# Patient Record
Sex: Female | Born: 1980
Health system: Southern US, Community
[De-identification: ages and names within clinical notes are randomized; demographics above are authoritative.]

## PROBLEM LIST (undated history)

## (undated) ENCOUNTER — Inpatient Hospital Stay (HOSPITAL_COMMUNITY): Payer: Self-pay

## (undated) DIAGNOSIS — D649 Anemia, unspecified: Secondary | ICD-10-CM

## (undated) DIAGNOSIS — R569 Unspecified convulsions: Secondary | ICD-10-CM

## (undated) DIAGNOSIS — D509 Iron deficiency anemia, unspecified: Secondary | ICD-10-CM

## (undated) DIAGNOSIS — J302 Other seasonal allergic rhinitis: Secondary | ICD-10-CM

## (undated) DIAGNOSIS — Z9289 Personal history of other medical treatment: Secondary | ICD-10-CM

## (undated) DIAGNOSIS — F329 Major depressive disorder, single episode, unspecified: Secondary | ICD-10-CM

## (undated) DIAGNOSIS — R531 Weakness: Secondary | ICD-10-CM

## (undated) DIAGNOSIS — K625 Hemorrhage of anus and rectum: Secondary | ICD-10-CM

## (undated) DIAGNOSIS — R112 Nausea with vomiting, unspecified: Secondary | ICD-10-CM

## (undated) DIAGNOSIS — K6289 Other specified diseases of anus and rectum: Secondary | ICD-10-CM

## (undated) DIAGNOSIS — R51 Headache: Secondary | ICD-10-CM

## (undated) DIAGNOSIS — R519 Headache, unspecified: Secondary | ICD-10-CM

## (undated) DIAGNOSIS — D569 Thalassemia, unspecified: Secondary | ICD-10-CM

## (undated) HISTORY — DX: Nausea with vomiting, unspecified: R11.2

## (undated) HISTORY — PX: DILATION AND CURETTAGE OF UTERUS: SHX78

## (undated) HISTORY — PX: ABDOMINAL HYSTERECTOMY: SHX81

## (undated) HISTORY — PX: TONGUE SURGERY: SHX810

## (undated) HISTORY — DX: Other specified diseases of anus and rectum: K62.89

## (undated) HISTORY — DX: Headache: R51

## (undated) HISTORY — DX: Iron deficiency anemia, unspecified: D50.9

## (undated) HISTORY — DX: Headache, unspecified: R51.9

## (undated) HISTORY — PX: LAPAROSCOPY: SHX197

## (undated) HISTORY — PX: TUBAL LIGATION: SHX77

## (undated) HISTORY — DX: Hemorrhage of anus and rectum: K62.5

## (undated) HISTORY — DX: Weakness: R53.1

## (undated) HISTORY — PX: WISDOM TOOTH EXTRACTION: SHX21

---

## 2005-04-06 ENCOUNTER — Emergency Department (HOSPITAL_COMMUNITY): Admission: EM | Admit: 2005-04-06 | Discharge: 2005-04-06 | Payer: Self-pay | Admitting: Emergency Medicine

## 2005-08-11 ENCOUNTER — Other Ambulatory Visit: Admission: RE | Admit: 2005-08-11 | Discharge: 2005-08-11 | Payer: Self-pay | Admitting: Gynecology

## 2005-10-01 ENCOUNTER — Inpatient Hospital Stay (HOSPITAL_COMMUNITY): Admission: AD | Admit: 2005-10-01 | Discharge: 2005-10-01 | Payer: Self-pay | Admitting: Obstetrics & Gynecology

## 2006-03-09 ENCOUNTER — Emergency Department (HOSPITAL_COMMUNITY): Admission: EM | Admit: 2006-03-09 | Discharge: 2006-03-10 | Payer: Self-pay | Admitting: Emergency Medicine

## 2006-04-15 ENCOUNTER — Encounter: Admission: RE | Admit: 2006-04-15 | Discharge: 2006-04-15 | Payer: Self-pay | Admitting: Family Medicine

## 2006-04-20 ENCOUNTER — Encounter: Admission: RE | Admit: 2006-04-20 | Discharge: 2006-04-20 | Payer: Self-pay | Admitting: Family Medicine

## 2006-08-25 ENCOUNTER — Encounter: Admission: RE | Admit: 2006-08-25 | Discharge: 2006-08-25 | Payer: Self-pay | Admitting: Family Medicine

## 2006-08-25 ENCOUNTER — Other Ambulatory Visit: Admission: RE | Admit: 2006-08-25 | Discharge: 2006-08-25 | Payer: Self-pay | Admitting: Gynecology

## 2007-07-26 ENCOUNTER — Emergency Department (HOSPITAL_COMMUNITY): Admission: EM | Admit: 2007-07-26 | Discharge: 2007-07-26 | Payer: Self-pay | Admitting: Emergency Medicine

## 2007-11-19 ENCOUNTER — Encounter: Admission: RE | Admit: 2007-11-19 | Discharge: 2007-11-19 | Payer: Self-pay | Admitting: Neurology

## 2007-11-20 ENCOUNTER — Other Ambulatory Visit: Admission: RE | Admit: 2007-11-20 | Discharge: 2007-11-20 | Payer: Self-pay | Admitting: Gynecology

## 2009-01-14 HISTORY — PX: CARPAL TUNNEL RELEASE: SHX101

## 2009-03-08 ENCOUNTER — Inpatient Hospital Stay (HOSPITAL_COMMUNITY): Admission: AD | Admit: 2009-03-08 | Discharge: 2009-03-08 | Payer: Self-pay | Admitting: Obstetrics and Gynecology

## 2009-07-04 ENCOUNTER — Emergency Department (HOSPITAL_COMMUNITY): Admission: EM | Admit: 2009-07-04 | Discharge: 2009-07-04 | Payer: Self-pay | Admitting: Emergency Medicine

## 2009-09-27 ENCOUNTER — Inpatient Hospital Stay (HOSPITAL_COMMUNITY): Admission: AD | Admit: 2009-09-27 | Discharge: 2009-09-27 | Payer: Self-pay | Admitting: Obstetrics & Gynecology

## 2010-06-18 ENCOUNTER — Inpatient Hospital Stay (HOSPITAL_COMMUNITY)
Admission: AD | Admit: 2010-06-18 | Discharge: 2010-06-18 | Payer: Self-pay | Source: Home / Self Care | Admitting: Obstetrics and Gynecology

## 2010-10-27 LAB — GC/CHLAMYDIA PROBE AMP, GENITAL
Chlamydia, DNA Probe: NEGATIVE
GC Probe Amp, Genital: NEGATIVE

## 2010-10-27 LAB — CBC
HCT: 30.5 % — ABNORMAL LOW (ref 36.0–46.0)
Hemoglobin: 9.4 g/dL — ABNORMAL LOW (ref 12.0–15.0)
MCH: 18 pg — ABNORMAL LOW (ref 26.0–34.0)
MCHC: 30.9 g/dL (ref 30.0–36.0)
MCV: 58.4 fL — ABNORMAL LOW (ref 78.0–100.0)
Platelets: 294 10*3/uL (ref 150–400)
RBC: 5.22 MIL/uL — ABNORMAL HIGH (ref 3.87–5.11)
RDW: 17.9 % — ABNORMAL HIGH (ref 11.5–15.5)
WBC: 11.9 10*3/uL — ABNORMAL HIGH (ref 4.0–10.5)

## 2010-10-27 LAB — WET PREP, GENITAL
Trich, Wet Prep: NONE SEEN
Yeast Wet Prep HPF POC: NONE SEEN

## 2010-10-27 LAB — URINE CULTURE
Colony Count: 25000
Culture  Setup Time: 201111040133

## 2010-10-27 LAB — URINALYSIS, ROUTINE W REFLEX MICROSCOPIC
Bilirubin Urine: NEGATIVE
Glucose, UA: 100 mg/dL — AB
Ketones, ur: 15 mg/dL — AB
Leukocytes, UA: NEGATIVE
Nitrite: NEGATIVE
Protein, ur: NEGATIVE mg/dL
Specific Gravity, Urine: 1.015 (ref 1.005–1.030)
Urobilinogen, UA: 0.2 mg/dL (ref 0.0–1.0)
pH: 7 (ref 5.0–8.0)

## 2010-10-27 LAB — URINE MICROSCOPIC-ADD ON

## 2010-10-27 LAB — POCT PREGNANCY, URINE: Preg Test, Ur: NEGATIVE

## 2010-10-27 LAB — PREGNANCY, URINE: Preg Test, Ur: NEGATIVE

## 2010-11-22 LAB — WET PREP, GENITAL
Trich, Wet Prep: NONE SEEN
Yeast Wet Prep HPF POC: NONE SEEN

## 2010-11-22 LAB — GC/CHLAMYDIA PROBE AMP, GENITAL
Chlamydia, DNA Probe: NEGATIVE
GC Probe Amp, Genital: NEGATIVE

## 2010-11-22 LAB — CBC
HCT: 31.6 % — ABNORMAL LOW (ref 36.0–46.0)
Hemoglobin: 9.8 g/dL — ABNORMAL LOW (ref 12.0–15.0)
MCHC: 31.1 g/dL (ref 30.0–36.0)
MCV: 59.4 fL — ABNORMAL LOW (ref 78.0–100.0)
Platelets: 333 10*3/uL (ref 150–400)
RBC: 5.32 MIL/uL — ABNORMAL HIGH (ref 3.87–5.11)
RDW: 17 % — ABNORMAL HIGH (ref 11.5–15.5)
WBC: 9 10*3/uL (ref 4.0–10.5)

## 2010-11-22 LAB — POCT PREGNANCY, URINE: Preg Test, Ur: NEGATIVE

## 2011-05-24 ENCOUNTER — Inpatient Hospital Stay (HOSPITAL_COMMUNITY)
Admission: AD | Admit: 2011-05-24 | Discharge: 2011-05-24 | Disposition: A | Discharge status: Home / Self Care | Payer: 59 | Source: Ambulatory Visit | Attending: Obstetrics and Gynecology | Admitting: Obstetrics and Gynecology

## 2011-05-24 ENCOUNTER — Encounter (HOSPITAL_COMMUNITY): Payer: Self-pay | Admitting: *Deleted

## 2011-05-24 DIAGNOSIS — Z3201 Encounter for pregnancy test, result positive: Secondary | ICD-10-CM

## 2011-05-24 HISTORY — DX: Unspecified convulsions: R56.9

## 2011-05-24 HISTORY — DX: Thalassemia, unspecified: D56.9

## 2011-05-24 HISTORY — DX: Anemia, unspecified: D64.9

## 2011-05-24 LAB — URINALYSIS, ROUTINE W REFLEX MICROSCOPIC
Bilirubin Urine: NEGATIVE
Glucose, UA: NEGATIVE mg/dL
Ketones, ur: NEGATIVE mg/dL
Leukocytes, UA: NEGATIVE
Nitrite: NEGATIVE
Protein, ur: NEGATIVE mg/dL
Specific Gravity, Urine: 1.02 (ref 1.005–1.030)
Urobilinogen, UA: 0.2 mg/dL (ref 0.0–1.0)
pH: 7.5 (ref 5.0–8.0)

## 2011-05-24 LAB — URINE MICROSCOPIC-ADD ON

## 2011-05-24 LAB — BASIC METABOLIC PANEL
BUN: 7
CO2: 25
Calcium: 9.4
Chloride: 107
Creatinine, Ser: 0.63
GFR calc Af Amer: >60
GFR calc non Af Amer: >60
Glucose, Bld: 172 — ABNORMAL HIGH
Potassium: 4.1
Sodium: 140

## 2011-05-24 LAB — POCT PREGNANCY, URINE: Preg Test, Ur: POSITIVE

## 2011-05-24 MED ORDER — PRENATAL RX 60-1 MG PO TABS: 1.0000 | ORAL_TABLET | Freq: Every day | ORAL | Status: DC

## 2011-05-24 NOTE — Progress Notes (Signed)
Pt in to find out if she was pregnant. LMP 04/21/11.  Had + UPT.  Reports small amount of lower back pain.  Denies any cramping or bleeding.  Is currently taking Depakote for seizures, worried about pregnancy.

## 2011-05-24 NOTE — ED Provider Notes (Signed)
History     CSN: [id number] Arrival date & time: 05/24/2011  6:21 PM  Chief Complaint  Patient presents with  . Amenorrhea   HPI Abigail Hall is a 30 y.o. female who presents to MAU for verification of pregnancy and discuss medications. Taking Depakote for seizures. Last seizure was over 2 years ago. Is a patient of Abigail Hall at [facility]. The patient states she has already decreased her medication from 500 mg. Bid to 500 mg. Daily since her positive pregnancy test.   PMH: Seizures  No past surgical history on file.  No family history on file.  History  Substance Use Topics  . Smoking status: Not on file  . Smokeless tobacco: Not on file  . Alcohol Use: Not on file    OB History    Grav Para Term Preterm Abortions TAB SAB Ect Mult Living   1               Review of Systems  Allergies  Latex and Morphine and related  Home Medications: Depakote, vitamins    BP 116/67  Pulse 77  Temp(Src) 98.1 F (36.7 C) (Oral)  Resp 16  LMP 04/21/2011  Physical Exam  Nursing note and vitals reviewed. Constitutional: She is oriented to person, place, and time. She appears well-developed and well-nourished. No distress.  HENT:  Head: Normocephalic.  Eyes: EOM are normal.  Neck: Neck supple.  Pulmonary/Chest: Effort normal.  Musculoskeletal: Normal range of motion.  Neurological: She is alert and oriented to person, place, and time.    ED Course  Procedures  Results for orders placed during the hospital encounter of 05/24/11 (from the past 24 hour(s))  URINALYSIS, ROUTINE W REFLEX MICROSCOPIC     Status: Abnormal   Collection Time   05/24/11  6:15 PM      Component Value Range   Color, Urine YELLOW  YELLOW    Appearance CLEAR  CLEAR    Specific Gravity, Urine 1.020  1.005 - 1.030    pH 7.5  5.0 - 8.0    Glucose, UA NEGATIVE  NEGATIVE (mg/dL)   Hgb urine dipstick TRACE (*) NEGATIVE    Bilirubin Urine NEGATIVE  NEGATIVE    Ketones, ur NEGATIVE  NEGATIVE  (mg/dL)   Protein, ur NEGATIVE  NEGATIVE (mg/dL)   Urobilinogen, UA 0.2  0.0 - 1.0 (mg/dL)   Nitrite NEGATIVE  NEGATIVE    Leukocytes, UA NEGATIVE  NEGATIVE   URINE MICROSCOPIC-ADD ON     Status: Normal   Collection Time   05/24/11  6:15 PM      Component Value Range   Squamous Epithelial / LPF RARE  RARE    RBC / HPF 0-2  <3 (RBC/hpf)   Bacteria, UA RARE  RARE   POCT PREGNANCY, URINE     Status: Normal   Collection Time   05/24/11  6:59 PM      Component Value Range   Preg Test, Ur POSITIVE     Assessment:  Pregnancy   Plan:  See neurologist tomorrow to discuss seizure medications in pregnancy   Start [facility] and prenatal vitamins. MDM: discussed with Dr. Vella Kohler, NP 05/24/11 2050

## 2011-05-24 NOTE — Progress Notes (Signed)
Pt LMP 04/21/2011, G2 P0.  +UPT at home.  Pt wants to make sure everything is ok, taking depakote and is unsure if she should continue taking this medication.

## 2011-06-01 ENCOUNTER — Other Ambulatory Visit: Payer: Self-pay | Admitting: Obstetrics and Gynecology

## 2011-06-01 ENCOUNTER — Other Ambulatory Visit (HOSPITAL_COMMUNITY)
Admission: RE | Admit: 2011-06-01 | Discharge: 2011-06-01 | Disposition: A | Discharge status: Home / Self Care | Payer: 59 | Source: Ambulatory Visit | Attending: Obstetrics and Gynecology | Admitting: Obstetrics and Gynecology

## 2011-06-01 DIAGNOSIS — Z124 Encounter for screening for malignant neoplasm of cervix: Secondary | ICD-10-CM | POA: Insufficient documentation

## 2011-06-01 DIAGNOSIS — R87810 Cervical high risk human papillomavirus (HPV) DNA test positive: Secondary | ICD-10-CM | POA: Insufficient documentation

## 2011-06-05 ENCOUNTER — Encounter (HOSPITAL_COMMUNITY): Payer: Self-pay | Admitting: *Deleted

## 2011-06-05 ENCOUNTER — Inpatient Hospital Stay (HOSPITAL_COMMUNITY)
Admission: AD | Admit: 2011-06-05 | Discharge: 2011-06-05 | Disposition: A | Discharge status: Home / Self Care | Payer: 59 | Source: Ambulatory Visit | Attending: Obstetrics and Gynecology | Admitting: Obstetrics and Gynecology

## 2011-06-05 ENCOUNTER — Inpatient Hospital Stay (HOSPITAL_COMMUNITY): Payer: 59

## 2011-06-05 DIAGNOSIS — O209 Hemorrhage in early pregnancy, unspecified: Secondary | ICD-10-CM | POA: Insufficient documentation

## 2011-06-05 LAB — CBC
HCT: 28.9 % — ABNORMAL LOW (ref 36.0–46.0)
Hemoglobin: 9 g/dL — ABNORMAL LOW (ref 12.0–15.0)
MCH: 17.8 pg — ABNORMAL LOW (ref 26.0–34.0)
MCHC: 31.1 g/dL (ref 30.0–36.0)
MCV: 57.2 fL — ABNORMAL LOW (ref 78.0–100.0)
Platelets: 322 10*3/uL (ref 150–400)
RBC: 5.05 MIL/uL (ref 3.87–5.11)
RDW: 16.9 % — ABNORMAL HIGH (ref 11.5–15.5)
WBC: 8.5 10*3/uL (ref 4.0–10.5)

## 2011-06-05 LAB — ABO/RH: ABO/RH(D): O POS

## 2011-06-05 LAB — HCG, QUANTITATIVE, PREGNANCY: hCG, Beta Chain, Quant, S: 12691 m[IU]/mL — ABNORMAL HIGH (ref <5)

## 2011-06-05 MED ORDER — FERROUS SULFATE 325 (65 FE) MG PO TABS: 325.0000 mg | ORAL_TABLET | Freq: Three times a day (TID) | ORAL | Status: DC

## 2011-06-05 NOTE — ED Provider Notes (Signed)
History     Chief Complaint  Patient presents with  . Vaginal Bleeding   HPI Noticed bleeding when wiping today, + cramping earlier, no pain now. Had pap on 10/16, no u/s yet.   OB History    Grav Para Term Preterm Abortions TAB SAB Ect Mult Living   1               Past Medical History  Diagnosis Date  . Seizures   . Anemia   . Thalassemia   . Epilepsy     Past Surgical History  Procedure Date  . Tongue surgery   . Laparoscopy   . Carpal tunnel release     No family history on file.  History  Substance Use Topics  . Smoking status: Never Smoker   . Smokeless tobacco: Not on file  . Alcohol Use: No    Allergies:  Allergies  Allergen Reactions  . Latex Swelling  . Morphine And Related Other (See Comments)    Reaction :hallucinations    Prescriptions prior to admission  Medication Sig Dispense Refill  . levETIRAcetam (KEPPRA) 500 MG tablet Take 1,000 mg by mouth every 12 (twelve) hours. Patient states that she is taking 1000 mg of this medication this week.  Next week she is supposed to take 2000 mg of this medication.       . Multiple Vitamin (MULTIVITAMIN) tablet Take 1 tablet by mouth daily.        . Prenatal Vit-Fe Fumarate-FA (PRENATAL MULTIVITAMIN) 60-1 MG tablet Take 1 tablet by mouth daily.  30 tablet  3  . divalproex (DEPAKOTE ER) 500 MG 24 hr tablet Take 500 mg by mouth daily.          Review of Systems  Constitutional: Negative.   Respiratory: Negative.   Cardiovascular: Negative.   Gastrointestinal: Negative for nausea, vomiting, abdominal pain, diarrhea and constipation.  Genitourinary: Negative for dysuria, urgency, frequency, hematuria and flank pain.       Positive for vaginal bleeding and cramping  Musculoskeletal: Negative.   Neurological: Negative.   Psychiatric/Behavioral: Negative.    Physical Exam   Height 5' 4.5" (1.638 m), weight 122.641 kg (270 lb 6 oz), last menstrual period 04/21/2011.  Physical Exam  Constitutional: She  is oriented to person, place, and time. She appears well-developed and well-nourished. No distress.  HENT:  Head: Normocephalic and atraumatic.  Cardiovascular: Normal rate, regular rhythm and normal heart sounds.   Respiratory: Effort normal and breath sounds normal. No respiratory distress.  GI: Soft. Bowel sounds are normal. She exhibits no distension and no mass. There is no tenderness. There is no rebound and no guarding.  Genitourinary: There is no rash or lesion on the right labia. There is no rash or lesion on the left labia. Uterus is not deviated, not enlarged, not fixed and not tender. Cervix exhibits no motion tenderness, no discharge and no friability. Right adnexum displays no mass, no tenderness and no fullness. Left adnexum displays no mass, no tenderness and no fullness. There is bleeding (moderate with clots) around the vagina. No erythema or tenderness around the vagina. No vaginal discharge found.  Neurological: She is alert and oriented to person, place, and time.  Skin: Skin is warm and dry.  Psychiatric: She has a normal mood and affect.    MAU Course  Procedures  Results for orders placed during the hospital encounter of 06/05/11 (from the past 24 hour(s))  ABO/RH     Status: Normal  Collection Time   06/05/11  5:00 PM      Component Value Range   ABO/RH(D) O POS    CBC     Status: Abnormal   Collection Time   06/05/11  5:05 PM      Component Value Range   WBC 8.5  4.0 - 10.5 (K/uL)   RBC 5.05  3.87 - 5.11 (MIL/uL)   Hemoglobin 9.0 (*) 12.0 - 15.0 (g/dL)   HCT 40.9 (*) 81.1 - 46.0 (%)   MCV 57.2 (*) 78.0 - 100.0 (fL)   MCH 17.8 (*) 26.0 - 34.0 (pg)   MCHC 31.1  30.0 - 36.0 (g/dL)   RDW 91.4 (*) 78.2 - 15.5 (%)   Platelets 322  150 - 400 (K/uL)  HCG, QUANTITATIVE, PREGNANCY     Status: Abnormal   Collection Time   06/05/11  5:05 PM      Component Value Range   hCG, Beta Chain, Quant, S 95621 (*) <5 (mIU/mL)    US Ob Comp Less 14 Wks: 6.3 wk IUP, FHR  observed, but unable to measure d/t small size of fetus/maternal body  Assessment and Plan  30 y.o. G1P0 at [redacted]w[redacted]d Bleeding in early pregnancy F/U in 1 week in office for confirmation of FHR per Dr. Haynes Kerns 06/05/2011, 6:05 PM

## 2011-06-05 NOTE — Progress Notes (Signed)
Pt states, " I had some cramping in my lower abdomen at 3 pm, and then I went to the bathroom and saw blood in my panties, and on the paper when I wiped. There was also blood in the tolite"

## 2011-06-15 LAB — HIV ANTIBODY (ROUTINE TESTING W REFLEX): HIV: NONREACTIVE

## 2011-06-15 LAB — ABO/RH: RH Type: POSITIVE

## 2011-06-15 LAB — ANTIBODY SCREEN: Antibody Screen: NEGATIVE

## 2011-06-15 LAB — HEPATITIS B SURFACE ANTIGEN: Hepatitis B Surface Ag: NEGATIVE

## 2011-06-15 LAB — RUBELLA ANTIBODY, IGM: Rubella: IMMUNE

## 2011-06-29 ENCOUNTER — Other Ambulatory Visit: Payer: Self-pay | Admitting: Obstetrics and Gynecology

## 2011-06-29 DIAGNOSIS — Z3682 Encounter for antenatal screening for nuchal translucency: Secondary | ICD-10-CM

## 2011-07-15 ENCOUNTER — Ambulatory Visit (HOSPITAL_COMMUNITY)
Admission: RE | Admit: 2011-07-15 | Discharge: 2011-07-15 | Disposition: A | Discharge status: Home / Self Care | Payer: Managed Care, Other (non HMO) | Source: Ambulatory Visit | Attending: Obstetrics and Gynecology | Admitting: Obstetrics and Gynecology

## 2011-07-15 ENCOUNTER — Encounter (HOSPITAL_COMMUNITY): Payer: Self-pay

## 2011-07-15 ENCOUNTER — Other Ambulatory Visit: Payer: Self-pay | Admitting: Obstetrics and Gynecology

## 2011-07-15 DIAGNOSIS — O09891 Supervision of other high risk pregnancies, first trimester: Secondary | ICD-10-CM | POA: Insufficient documentation

## 2011-07-15 DIAGNOSIS — Z3682 Encounter for antenatal screening for nuchal translucency: Secondary | ICD-10-CM

## 2011-07-15 DIAGNOSIS — Z3689 Encounter for other specified antenatal screening: Secondary | ICD-10-CM | POA: Insufficient documentation

## 2011-07-15 DIAGNOSIS — O351XX0 Maternal care for (suspected) chromosomal abnormality in fetus, not applicable or unspecified: Secondary | ICD-10-CM | POA: Insufficient documentation

## 2011-07-15 DIAGNOSIS — D561 Beta thalassemia: Secondary | ICD-10-CM | POA: Insufficient documentation

## 2011-07-15 DIAGNOSIS — O3510X0 Maternal care for (suspected) chromosomal abnormality in fetus, unspecified, not applicable or unspecified: Secondary | ICD-10-CM | POA: Insufficient documentation

## 2011-07-15 DIAGNOSIS — O99350 Diseases of the nervous system complicating pregnancy, unspecified trimester: Secondary | ICD-10-CM | POA: Insufficient documentation

## 2011-07-15 DIAGNOSIS — D563 Thalassemia minor: Secondary | ICD-10-CM | POA: Insufficient documentation

## 2011-07-15 DIAGNOSIS — G40909 Epilepsy, unspecified, not intractable, without status epilepticus: Secondary | ICD-10-CM | POA: Insufficient documentation

## 2011-07-15 HISTORY — DX: Major depressive disorder, single episode, unspecified: F32.9

## 2011-07-15 HISTORY — DX: Supervision of other high risk pregnancies, first trimester: O09.891

## 2011-07-15 NOTE — Progress Notes (Signed)
Genetic Counseling  High-Risk Gestation Note  Appointment Date:  07/15/2011 Referred By: Fortino Sic, MD Date of Birth:  1980/08/19 Partner:  Matthew Folks  Pregnancy History: [id number] Estimated Date of Delivery: 01/26/12 Estimated Gestational Age: [redacted]w[redacted]d Attending: Particia Nearing, MD  Abigail Hall and her partner, Mr. 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 Abigail Hall's complete blood count indicated low mean corpuscular volume (MCV), low mean corpuscular hemoglobin, and low hemoglobin. 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 Genetic testing would be available to confirm this, but is optional and not necessary. Carriers of Beta-Thalassemia typically do not have any symptoms but may be slightly anemic, which is typically unresponsive to iron supplementation. Abigail Hall reported that her mother also has beta thalassemia minor. Her father has sickle cell trait. Her paternal half-sister has sickle cell disease, and her full sister has sickle-beta thalassemia disease.    The father of the pregnancy reported 3 paternal uncles who passed away from sickle cell disease. He reported that his father does not have sickle cell disease nor sickle cell trait. The father of the pregnancy also reported that he has been tested through Abigail Hall's OB office and found not to be a carrier for beta-thalassemia. We discussed that hemoglobin electrophoresis with quantitative A2, complete blood count, and ferritin studies would be most informative to screen for both beta thalassemia and sickle  cell trait in addition to other hemoglobin variants.  At this time, he declined to have [place] review his medical records to confirm the specific testing performed.   Hemoglobin is the oxygen-carrying pigment of red blood cells. The type of hemoglobin we have is determined by inheritance. Thalassemias and sickle cell disease are inherited blood disorders caused by abnormal production of hemoglobin. Beta-Thalassemia is a disease characterized by a decrease in the ability of the blood to carry oxygen, which often causes anemia, hepatosplenomegaly, and failure to thrive in infancy.  Treatment typically consists of regular blood transfusions and chelation therapy. We reviewed the autosomal recessive inheritance of beta thalassemia and sickle-beta thalassemia. Thus, both parents must be carriers of beta-thalassemia trait or other hemoglobin variant, in order for a pregnancy to be at risk to inherit thalassemia or other hemoglobinopathy. We reviewed that in the case where both parents are carriers, each pregnancy has a 1 in 4 chance to be affected, a 1 in 2 chance to be a carrier, and a 1 in 4 chance to be neither affected nor a carrier. Prenatal diagnosis would be available via CVS or amniocentesis in the case where both parents were known carriers and when the causative mutations are also known. Given the reported information, the pregnancy would not be expected to be at risk to inherit Beta-Thalassemia major, but would have a 1 in 2 (50%) risk to inherit beta-thalassemia trait, similarly to Ms. Jimmerson. Additional information regarding the screening performed for the father of the pregnancy may alter risk assessment.   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 Epilepsy occurs in 1% of the population and can have many causes.  Approximately 80% of epilepsy is thought to be idiopathic while the remaining 20% is secondary to  a variety of factors such as perinatal events,  infections, trauma and genetic disease.  A specific diagnosis in an affected individual is necessary to accurately assess the risk for other family members to develop epilepsy.  In the absence of a known etiology, epilepsy is thought to be caused by a combination of genetic and environmental factors, called multifactorial inheritance. Recurrence risk for offspring of individuals with idiopathic epilepsy is approximately 4%. It would be important for Abigail Hall's pediatrician to be aware of this history so that her child(ren) can be screened and followed appropriately. Without further information regarding the provided family history, an accurate genetic risk cannot be calculated. Further genetic counseling is warranted if more information is obtained.   Her medical and surgical histories were contributory for epilepsy as previously mentioned. Abigail Hall reported taking Depakote (valproic acid) for treatment of epilepsy and discontinued this medication at the time of her first missed menstrual cycle (approximately [redacted] weeks gestation of the pregnancy). She reported that her neurologist then prescribed Keppra, which she is currently taking. The all-or-none period was discussed, meaning exposures that occur in the first 4 weeks of gestation are typically thought to either not affect the pregnancy at all or result in a miscarriage.  Current data suggests that the prenatal use of Depakote (valproic acid) during pregnancy is associated with an increased risk for neural tube defects like spina bifida.  The magnitude of this risk appears to be approximately 1-2%.We reviewed that MSAFP and targeted ultrasound would be available to the patient in the current pregnancy to screen for open neural tube defects.  However, we discussed that per the patient's report, Depakote use likely occurred during the "all-or-none" time frame.   Even though a limited number of medicines are known to cause birth defects, we  cannot say that it is completely safe to use any medicines during pregnancy. It is also not possible to predict any drug-drug interactions that occur, or how they might affect a pregnancy.  The use of medications is recommended in pregnancy when the benefit to the mother (and pregnancy) outweighs potential risks to the baby.  Sometimes the maternal use of medications, dictated by a medical condition such as epilepsy, may even be more beneficial to a pregnancy than not taking the medication(s) at all.  In this instance, controlling Ms. Stoecker's seizures is more beneficial to the patient and her pregnancy; cessation of her medication and thus onset of seizures would be more harmful to her and the pregnancy. Ms. Sonier should not alter her medication without first consulting her physician. Ms. Otilia Kareem Stroschein denied exposure to environmental toxins. She denied the use of alcohol, tobacco or street drugs. She denied significant viral illnesses during the course of her pregnancy.    We reviewed available screening and diagnostic options.  Regarding screening tests, we discussed the options of First screen, MSAFP and ultrasound.  The risks, benefits, and limitations of these screens were reviewed with the couple. They understand that these tests cannot rule out all birth defects or genetic conditions. The patient elected to proceed with First screen at the time of today's visit. Complete ultrasound results reported separately.   I counseled this couple regarding the above risks and available options.  The approximate face-to-face time with the genetic counselor was 45 minutes.  Quinn Plowman, MS,  Certified Genetic Counselor 07/15/2011

## 2011-07-27 ENCOUNTER — Telehealth (HOSPITAL_COMMUNITY): Payer: Self-pay | Admitting: MS"

## 2011-07-27 ENCOUNTER — Other Ambulatory Visit: Payer: Self-pay

## 2011-07-27 NOTE — Telephone Encounter (Signed)
First trimester screen result to patient. Within range for the three conditions screened. Screening reduced the chance for fetal Down syndrome to 1 in 6,198 and reduced the chance for fetal trisomy 18 or trisomy 13 to less than 1 in 10,000.

## 2011-07-27 NOTE — Telephone Encounter (Signed)
Left message for patient to return call.

## 2011-08-30 ENCOUNTER — Inpatient Hospital Stay (HOSPITAL_COMMUNITY)
Admission: AD | Admit: 2011-08-30 | Discharge: 2011-08-31 | Disposition: A | Discharge status: Home / Self Care | Payer: Managed Care, Other (non HMO) | Source: Ambulatory Visit | Attending: Obstetrics and Gynecology | Admitting: Obstetrics and Gynecology

## 2011-08-30 ENCOUNTER — Encounter (HOSPITAL_COMMUNITY): Payer: Self-pay | Admitting: *Deleted

## 2011-08-30 DIAGNOSIS — R81 Glycosuria: Secondary | ICD-10-CM

## 2011-08-30 DIAGNOSIS — R109 Unspecified abdominal pain: Secondary | ICD-10-CM | POA: Insufficient documentation

## 2011-08-30 DIAGNOSIS — N760 Acute vaginitis: Secondary | ICD-10-CM | POA: Insufficient documentation

## 2011-08-30 DIAGNOSIS — B9689 Other specified bacterial agents as the cause of diseases classified elsewhere: Secondary | ICD-10-CM

## 2011-08-30 DIAGNOSIS — A499 Bacterial infection, unspecified: Secondary | ICD-10-CM | POA: Insufficient documentation

## 2011-08-30 DIAGNOSIS — O2390 Unspecified genitourinary tract infection in pregnancy, unspecified trimester: Secondary | ICD-10-CM | POA: Insufficient documentation

## 2011-08-30 LAB — URINE MICROSCOPIC-ADD ON

## 2011-08-30 LAB — URINALYSIS, ROUTINE W REFLEX MICROSCOPIC
Bilirubin Urine: NEGATIVE
Glucose, UA: >1000 mg/dL — AB
Hgb urine dipstick: NEGATIVE
Ketones, ur: NEGATIVE mg/dL
Leukocytes, UA: NEGATIVE
Nitrite: NEGATIVE
Protein, ur: NEGATIVE mg/dL
Specific Gravity, Urine: <1.005 — ABNORMAL LOW (ref 1.005–1.030)
Urobilinogen, UA: 0.2 mg/dL (ref 0.0–1.0)
pH: 6.5 (ref 5.0–8.0)

## 2011-08-30 LAB — GLUCOSE, CAPILLARY: Glucose-Capillary: 140 mg/dL — ABNORMAL HIGH (ref 70–99)

## 2011-08-30 LAB — WET PREP, GENITAL
Trich, Wet Prep: NONE SEEN
Yeast Wet Prep HPF POC: NONE SEEN

## 2011-08-30 MED ORDER — PHENAZOPYRIDINE HCL 100 MG PO TABS
100.0000 mg | ORAL_TABLET | ORAL | Status: AC
  Administered 2011-08-30: 100 mg via ORAL
  Filled 2011-08-30: qty 1

## 2011-08-30 NOTE — Progress Notes (Signed)
FSBS 140

## 2011-08-30 NOTE — Progress Notes (Signed)
Pt sttes tht since Sunday she had had lower abd pain tht is worse when she moves

## 2011-08-31 ENCOUNTER — Ambulatory Visit (HOSPITAL_COMMUNITY)
Admission: RE | Admit: 2011-08-31 | Discharge: 2011-08-31 | Disposition: A | Discharge status: Home / Self Care | Payer: Managed Care, Other (non HMO) | Source: Ambulatory Visit | Attending: Obstetrics and Gynecology | Admitting: Obstetrics and Gynecology

## 2011-08-31 DIAGNOSIS — E669 Obesity, unspecified: Secondary | ICD-10-CM | POA: Insufficient documentation

## 2011-08-31 DIAGNOSIS — O09891 Supervision of other high risk pregnancies, first trimester: Secondary | ICD-10-CM

## 2011-08-31 DIAGNOSIS — O358XX0 Maternal care for other (suspected) fetal abnormality and damage, not applicable or unspecified: Secondary | ICD-10-CM | POA: Insufficient documentation

## 2011-08-31 LAB — GC/CHLAMYDIA PROBE AMP, GENITAL
Chlamydia, DNA Probe: NEGATIVE
GC Probe Amp, Genital: NEGATIVE

## 2011-08-31 MED ORDER — METRONIDAZOLE 500 MG PO TABS: 500.0000 mg | ORAL_TABLET | Freq: Two times a day (BID) | ORAL | Status: AC

## 2011-08-31 NOTE — ED Provider Notes (Signed)
Attestation of Attending Supervision of Advanced Practitioner: Evaluation and management procedures were performed by the PA/NP/CNM/OB Fellow under my supervision/collaboration. Chart reviewed, and agree with management and plan.  Climmie Buelow, M.D. 08/31/2011 7:25 AM   

## 2011-08-31 NOTE — ED Provider Notes (Signed)
History    G1 presents to MAU at 18 w with constant lower abdominal pain and vaginal pain x3 days.  Denies vaginal discharge, bleeding, or LOF.  Denies urinary frequency, dysuria, or urgency. Pt denies new sexual partner or hx of STDs.  Chief Complaint  Patient presents with  . Abdominal Pain   HPI  OB History    Grav Para Term Preterm Abortions TAB SAB Ect Mult Living   3 0 0 0 2 1 1 0 0 0       Past Medical History  Diagnosis Date  . Seizures   . Anemia   . Thalassemia   . Epilepsy   . Depression     Past Surgical History  Procedure Date  . Tongue surgery   . Laparoscopy   . Carpal tunnel release     No family history on file.  History  Substance Use Topics  . Smoking status: Never Smoker   . Smokeless tobacco: Not on file  . Alcohol Use: No    Allergies:  Allergies  Allergen Reactions  . Latex Swelling    Prescriptions prior to admission  Medication Sig Dispense Refill  . acetaminophen (TYLENOL) 500 MG tablet Take 1,000 mg by mouth every 6 (six) hours as needed. For pain      . FOLIC ACID PO Take 1 tablet by mouth daily.       Marland Kitchen levETIRAcetam (KEPPRA) 500 MG tablet Take 1,000 mg by mouth every 12 (twelve) hours.       . magnesium hydroxide (MILK OF MAGNESIA) 400 MG/5ML suspension Take 15 mLs by mouth daily as needed. For constipation      . pantoprazole (PROTONIX) 40 MG tablet Take 40 mg by mouth daily.      . Prenatal Vit-Fe Fumarate-FA (PRENATAL MULTIVITAMIN) 60-1 MG tablet Take 1 tablet by mouth daily.  30 tablet  3  . PRESCRIPTION MEDICATION Apply 1 application topically daily as needed. Prescription hemorrhoid cream used as needed for hemorrhoids      . promethazine (PHENERGAN) 25 MG tablet Take 25 mg by mouth daily as needed. For nausea      . Pyridoxine HCl (VITAMIN B6 PO) Take 1 tablet by mouth daily.       . pseudoephedrine (SUDAFED) 30 MG tablet Take 30 mg by mouth every 6 (six) hours as needed. For congestion        Review of Systems    Constitutional: Negative.   HENT: Negative.   Eyes: Negative.   Respiratory: Negative.   Cardiovascular: Negative.   Gastrointestinal: Negative.   Genitourinary: Negative.   Musculoskeletal: Negative.   Skin: Negative.   Neurological: Negative.   Endo/Heme/Allergies: Negative.   Psychiatric/Behavioral: Negative.    Physical Exam   Blood pressure 127/78, pulse 103, temperature 98.2 F (36.8 C), temperature source Oral, resp. rate 18, height 5\' 4"  (1.626 m), weight 130.636 kg (288 lb), last menstrual period 04/21/2011, SpO2 98.00%.  Physical Exam  Constitutional: She is oriented to person, place, and time. She appears well-developed and well-nourished.  Neck: Normal range of motion.  Respiratory: Effort normal.  GI: Soft.  Genitourinary: There is tenderness around the vagina. Vaginal discharge found.       White creamy discharge noted on vaginal walls with mild erythema at introitus.  No lesions noted.   Cervix 0/th/hi  Musculoskeletal: Normal range of motion.  Neurological: She is alert and oriented to person, place, and time.  Skin: Skin is warm and dry.  Psychiatric: She has a  normal mood and affect. Her behavior is normal. Thought content normal.   Results for orders placed during the hospital encounter of 08/30/11 (from the past 24 hour(s))  URINALYSIS, ROUTINE W REFLEX MICROSCOPIC     Status: Abnormal   Collection Time   08/30/11  9:55 PM      Component Value Range   Color, Urine YELLOW  YELLOW    APPearance HAZY (*) CLEAR    Specific Gravity, Urine <1.005 (*) 1.005 - 1.030    pH 6.5  5.0 - 8.0    Glucose, UA >1000 (*) NEGATIVE (mg/dL)   Hgb urine dipstick NEGATIVE  NEGATIVE    Bilirubin Urine NEGATIVE  NEGATIVE    Ketones, ur NEGATIVE  NEGATIVE (mg/dL)   Protein, ur NEGATIVE  NEGATIVE (mg/dL)   Urobilinogen, UA 0.2  0.0 - 1.0 (mg/dL)   Nitrite NEGATIVE  NEGATIVE    Leukocytes, UA NEGATIVE  NEGATIVE   URINE MICROSCOPIC-ADD ON     Status: Abnormal   Collection  Time   08/30/11  9:55 PM      Component Value Range   Squamous Epithelial / LPF MANY (*) RARE    WBC, UA 0-2  <3 (WBC/hpf)   Bacteria, UA RARE  RARE   WET PREP, GENITAL     Status: Abnormal   Collection Time   08/30/11 10:50 PM      Component Value Range   Yeast, Wet Prep NONE SEEN  NONE SEEN    Trich, Wet Prep NONE SEEN  NONE SEEN    Clue Cells, Wet Prep MODERATE (*) NONE SEEN    WBC, Wet Prep HPF POC RARE (*) NONE SEEN   GLUCOSE, CAPILLARY     Status: Abnormal   Collection Time   08/30/11 11:57 PM      Component Value Range   Glucose-Capillary 140 (*) 70 - 99 (mg/dL)    MAU Course  Procedures U/A Pelvic exam with GC/Chlamydia and wet prep  MDM Consulted Dr Neva Seat, who agrees with POC. Bedside blood glucose in MAU  Assessment and Plan  A: Bacterial Vaginosis Elevated urine glucose in pregnancy  P: D/C home Prescription for Flagyl. F/U with [facility] this week for glucose screening.  LEFTWICH-KIRBY, Genesi Stefanko 08/31/2011, 12:06 AM

## 2011-08-31 NOTE — Progress Notes (Signed)
Elbert Ewings Leftwich-kirby, CNM at bedside.  poc discussed with pt.

## 2011-10-12 ENCOUNTER — Ambulatory Visit (HOSPITAL_COMMUNITY)
Admission: RE | Admit: 2011-10-12 | Discharge: 2011-10-12 | Disposition: A | Discharge status: Home / Self Care | Payer: Managed Care, Other (non HMO) | Source: Ambulatory Visit | Attending: Obstetrics and Gynecology | Admitting: Obstetrics and Gynecology

## 2011-10-12 DIAGNOSIS — E669 Obesity, unspecified: Secondary | ICD-10-CM | POA: Insufficient documentation

## 2011-10-12 DIAGNOSIS — O358XX0 Maternal care for other (suspected) fetal abnormality and damage, not applicable or unspecified: Secondary | ICD-10-CM | POA: Insufficient documentation

## 2011-10-12 DIAGNOSIS — O09891 Supervision of other high risk pregnancies, first trimester: Secondary | ICD-10-CM

## 2011-10-12 DIAGNOSIS — Z1389 Encounter for screening for other disorder: Secondary | ICD-10-CM | POA: Insufficient documentation

## 2011-10-12 DIAGNOSIS — Z363 Encounter for antenatal screening for malformations: Secondary | ICD-10-CM | POA: Insufficient documentation

## 2011-10-30 ENCOUNTER — Inpatient Hospital Stay (HOSPITAL_COMMUNITY)
Admission: AD | Admit: 2011-10-30 | Discharge: 2011-10-31 | Disposition: A | Discharge status: Home / Self Care | Payer: Managed Care, Other (non HMO) | Source: Ambulatory Visit | Attending: Obstetrics and Gynecology | Admitting: Obstetrics and Gynecology

## 2011-10-30 ENCOUNTER — Other Ambulatory Visit: Payer: Self-pay

## 2011-10-30 ENCOUNTER — Encounter (HOSPITAL_COMMUNITY): Payer: Self-pay | Admitting: *Deleted

## 2011-10-30 DIAGNOSIS — O99891 Other specified diseases and conditions complicating pregnancy: Secondary | ICD-10-CM | POA: Insufficient documentation

## 2011-10-30 DIAGNOSIS — R040 Epistaxis: Secondary | ICD-10-CM | POA: Insufficient documentation

## 2011-10-30 DIAGNOSIS — O2340 Unspecified infection of urinary tract in pregnancy, unspecified trimester: Secondary | ICD-10-CM

## 2011-10-30 DIAGNOSIS — R079 Chest pain, unspecified: Secondary | ICD-10-CM | POA: Insufficient documentation

## 2011-10-30 NOTE — MAU Note (Signed)
Pt states, " Earlier to day at 3:30 pm I was sitting on the tolite and when I pushed, a gush of blood came out of my nose and my mouth. It lasted 15-20 min. Tonight I was at dinner in [place] at 7 pm and it happened again. It lasted for 30 min and I coughed up a lot more blood. They called an ambulance and I went to the hospital up there. I called and talked to Dr. Chilton Si and she told me to come down to [facility] and get checked. My BP was up while I was up there, and on the way here my chest hurt alittle."

## 2011-10-30 NOTE — MAU Provider Note (Signed)
History     CSN: [id number]  Arrival date and time: 10/30/11 2203   First Provider Initiated Contact with Patient 10/30/11 2301   31 y.o.G3P0020 @[redacted]w[redacted]d   Chief Complaint  Patient presents with  . Epistaxis  . Chest Pain   HPI Pt presents with report of coughing since last night, keeping her awake, and single episode of nose bleed at 3pm, with some residual coughing of drainage of bright red blood for 2-3 hours following nosebleed. She was nauseous and vomited x1 r/t the bleeding. Pt reports chest tightness and sharp pain in car on the way to the hospital.  She is not having chest pain currently, and no nose bleeding at this time.  She reports good fetal movement and denies LOF, vaginal bleeding, vaginal burning/itching, urinary symptoms, h/a, visual disturbances, dizziness, or fever/chills.  OB History    Grav Para Term Preterm Abortions TAB SAB Ect Mult Living   3 0 0 0 2 1 1 0 0 0       Past Medical History  Diagnosis Date  . Seizures   . Anemia   . Thalassemia   . Epilepsy   . Depression     Past Surgical History  Procedure Date  . Tongue surgery   . Laparoscopy   . Carpal tunnel release     Family History  Problem Relation Age of Onset  . Anesthesia problems Neg Hx   . Hypotension Neg Hx   . Malignant hyperthermia Neg Hx   . Pseudochol deficiency Neg Hx     History  Substance Use Topics  . Smoking status: Never Smoker   . Smokeless tobacco: Not on file  . Alcohol Use: No    Allergies:  Allergies  Allergen Reactions  . Latex Swelling    Prescriptions prior to admission  Medication Sig Dispense Refill  . acetaminophen (TYLENOL) 500 MG tablet Take 1,000 mg by mouth every 6 (six) hours as needed. For pain      . diphenhydramine-acetaminophen (TYLENOL PM) 25-500 MG TABS Take 1 tablet by mouth at bedtime as needed. As needed for sleep      . docusate sodium (COLACE) 100 MG capsule Take 100 mg by mouth 2 (two) times daily. As needed for constipation      .  FOLIC ACID PO Take 1 tablet by mouth daily.       Marland Kitchen levETIRAcetam (KEPPRA) 500 MG tablet Take 2,000 mg by mouth every 12 (twelve) hours.       . magnesium hydroxide (MILK OF MAGNESIA) 400 MG/5ML suspension Take 15 mLs by mouth daily as needed. For constipation      . ondansetron (ZOFRAN-ODT) 4 MG disintegrating tablet Take 4 mg by mouth every 8 (eight) hours as needed. As needed for acid reflux      . pantoprazole (PROTONIX) 40 MG tablet Take 40 mg by mouth daily.      . Prenatal Vit-Fe Fumarate-FA (PRENATAL MULTIVITAMIN) TABS Take 1 tablet by mouth daily.      Marland Kitchen PRESCRIPTION MEDICATION Apply 1 application topically daily as needed. Prescription hemorrhoid cream used as needed for hemorrhoids      . promethazine (PHENERGAN) 25 MG tablet Take 25 mg by mouth daily as needed. For nausea      . pseudoephedrine (SUDAFED) 30 MG tablet Take 30 mg by mouth every 6 (six) hours as needed. For congestion      . Pyridoxine HCl (VITAMIN B6 PO) Take 1 tablet by mouth daily.       Marland Kitchen  zolpidem (AMBIEN) 5 MG tablet Take 5 mg by mouth at bedtime as needed. As needed for insomnia        Review of Systems  Constitutional: Negative for fever, chills and malaise/fatigue.  HENT: Positive for nosebleeds and sore throat.   Eyes: Negative for blurred vision.  Respiratory: Positive for cough and hemoptysis. Negative for shortness of breath.   Cardiovascular: Positive for chest pain.  Gastrointestinal: Positive for nausea and vomiting. Negative for heartburn.  Genitourinary: Negative for dysuria, urgency and frequency.  Musculoskeletal: Negative.   Neurological: Positive for seizures. Negative for dizziness and headaches.       Last seizure 1 week ago  Psychiatric/Behavioral: Negative for depression.   Physical Exam   Blood pressure 127/75, pulse 120, resp. rate 18, height 5\' 4"  (1.626 m), weight 132.224 kg (291 lb 8 oz), last menstrual period 04/21/2011, SpO2 100.00%.  Physical Exam  Nursing note and vitals  reviewed. Constitutional: She is oriented to person, place, and time. She appears well-developed and well-nourished.  HENT:  Nose: No mucosal edema, rhinorrhea, nose lacerations or sinus tenderness. No epistaxis.  Mouth/Throat: Posterior oropharyngeal erythema present. No oropharyngeal exudate or posterior oropharyngeal edema.       Nasal mucosa moist, pink, without lesion  Neck: Normal range of motion.  Cardiovascular: Normal rate, regular rhythm and normal heart sounds.   Respiratory: Effort normal and breath sounds normal.  GI: Soft.  Musculoskeletal: Normal range of motion.  Neurological: She is alert and oriented to person, place, and time. She has normal reflexes.  Skin: Skin is warm and dry.  Psychiatric: She has a normal mood and affect. Her behavior is normal. Judgment and thought content normal.    MAU Course  Procedures EKG, CBC, CMP  Results for orders placed during the hospital encounter of 10/30/11 (from the past 24 hour(s))  COMPREHENSIVE METABOLIC PANEL     Status: Abnormal   Collection Time   10/30/11 11:50 PM      Component Value Range   Sodium 134 (*) 135 - 145 (mEq/L)   Potassium 3.8  3.5 - 5.1 (mEq/L)   Chloride 103  96 - 112 (mEq/L)   CO2 21  19 - 32 (mEq/L)   Glucose, Bld 190 (*) 70 - 99 (mg/dL)   BUN 5 (*) 6 - 23 (mg/dL)   Creatinine, Ser 1.61  0.50 - 1.10 (mg/dL)   Calcium 9.7  8.4 - 09.6 (mg/dL)   Total Protein 5.8 (*) 6.0 - 8.3 (g/dL)   Albumin 2.6 (*) 3.5 - 5.2 (g/dL)   AST 17  0 - 37 (U/L)   ALT 17  0 - 35 (U/L)   Alkaline Phosphatase 96  39 - 117 (U/L)   Total Bilirubin 0.3  0.3 - 1.2 (mg/dL)   GFR calc non Af Amer >90  >90 (mL/min)   GFR calc Af Amer >90  >90 (mL/min)  CBC     Status: Abnormal   Collection Time   10/30/11 11:50 PM      Component Value Range   WBC 10.9 (*) 4.0 - 10.5 (K/uL)   RBC 4.95  3.87 - 5.11 (MIL/uL)   Hemoglobin 8.8 (*) 12.0 - 15.0 (g/dL)   HCT 04.5 (*) 40.9 - 46.0 (%)   MCV 59.0 (*) 78.0 - 100.0 (fL)   MCH 17.8 (*)  26.0 - 34.0 (pg)   MCHC 30.1  30.0 - 36.0 (g/dL)   RDW 81.1 (*) 91.4 - 15.5 (%)   Platelets 311  150 - 400 (  K/uL)   MDM Called Dr Neva Seat regarding pt symptoms and assessment.  If labs stable, plan to D/C home with cough syrup with codeine, saline nasal spray, and f/u in office on Monday.  A second nosebleed occurred in MAU with large amount bright red bleeding with clots, resolved with nasal packing and pinching of nares.    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

## 2011-10-31 LAB — COMPREHENSIVE METABOLIC PANEL
ALT: 17 U/L (ref 0–35)
AST: 17 U/L (ref 0–37)
Albumin: 2.6 g/dL — ABNORMAL LOW (ref 3.5–5.2)
Alkaline Phosphatase: 96 U/L (ref 39–117)
BUN: 5 mg/dL — ABNORMAL LOW (ref 6–23)
CO2: 21 mEq/L (ref 19–32)
Calcium: 9.7 mg/dL (ref 8.4–10.5)
Chloride: 103 mEq/L (ref 96–112)
Creatinine, Ser: 0.52 mg/dL (ref 0.50–1.10)
GFR calc Af Amer: >90 mL/min (ref >90)
GFR calc non Af Amer: >90 mL/min (ref >90)
Glucose, Bld: 190 mg/dL — ABNORMAL HIGH (ref 70–99)
Potassium: 3.8 mEq/L (ref 3.5–5.1)
Sodium: 134 mEq/L — ABNORMAL LOW (ref 135–145)
Total Bilirubin: 0.3 mg/dL (ref 0.3–1.2)
Total Protein: 5.8 g/dL — ABNORMAL LOW (ref 6.0–8.3)

## 2011-10-31 LAB — CBC
HCT: 29.2 % — ABNORMAL LOW (ref 36.0–46.0)
Hemoglobin: 8.8 g/dL — ABNORMAL LOW (ref 12.0–15.0)
MCH: 17.8 pg — ABNORMAL LOW (ref 26.0–34.0)
MCHC: 30.1 g/dL (ref 30.0–36.0)
MCV: 59 fL — ABNORMAL LOW (ref 78.0–100.0)
Platelets: 311 10*3/uL (ref 150–400)
RBC: 4.95 MIL/uL (ref 3.87–5.11)
RDW: 17.6 % — ABNORMAL HIGH (ref 11.5–15.5)
WBC: 10.9 10*3/uL — ABNORMAL HIGH (ref 4.0–10.5)

## 2011-10-31 MED ORDER — GUAIFENESIN-CODEINE 100-10 MG/5ML PO SYRP: 5.0000 mL | ORAL_SOLUTION | Freq: Every evening | ORAL | Status: AC | PRN

## 2011-10-31 MED ORDER — SODIUM CHLORIDE 0.65 % NA SOLN: 1.0000 | NASAL | Status: DC | PRN

## 2011-10-31 MED ORDER — MENTHOL 3 MG MT LOZG
1.0000 | LOZENGE | OROMUCOSAL | Status: AC
  Administered 2011-10-31: 3 mg via ORAL
  Filled 2011-10-31: qty 9

## 2011-10-31 NOTE — Discharge Instructions (Signed)

## 2011-11-01 ENCOUNTER — Encounter (HOSPITAL_COMMUNITY): Payer: Self-pay | Admitting: *Deleted

## 2011-11-01 ENCOUNTER — Inpatient Hospital Stay (HOSPITAL_COMMUNITY)
Admission: AD | Admit: 2011-11-01 | Discharge: 2011-11-05 | DRG: 781 | Disposition: A | Discharge status: Home Health | Payer: Managed Care, Other (non HMO) | Source: Ambulatory Visit | Attending: Obstetrics and Gynecology | Admitting: Obstetrics and Gynecology

## 2011-11-01 DIAGNOSIS — O99810 Abnormal glucose complicating pregnancy: Principal | ICD-10-CM | POA: Diagnosis present

## 2011-11-01 DIAGNOSIS — R000 Tachycardia, unspecified: Secondary | ICD-10-CM | POA: Diagnosis present

## 2011-11-01 DIAGNOSIS — G40909 Epilepsy, unspecified, not intractable, without status epilepticus: Secondary | ICD-10-CM | POA: Diagnosis present

## 2011-11-01 DIAGNOSIS — O3660X0 Maternal care for excessive fetal growth, unspecified trimester, not applicable or unspecified: Secondary | ICD-10-CM | POA: Diagnosis present

## 2011-11-01 DIAGNOSIS — D649 Anemia, unspecified: Secondary | ICD-10-CM | POA: Diagnosis present

## 2011-11-01 DIAGNOSIS — O99019 Anemia complicating pregnancy, unspecified trimester: Secondary | ICD-10-CM | POA: Diagnosis present

## 2011-11-01 DIAGNOSIS — O24419 Gestational diabetes mellitus in pregnancy, unspecified control: Secondary | ICD-10-CM

## 2011-11-01 DIAGNOSIS — R509 Fever, unspecified: Secondary | ICD-10-CM | POA: Diagnosis present

## 2011-11-01 DIAGNOSIS — J069 Acute upper respiratory infection, unspecified: Secondary | ICD-10-CM | POA: Diagnosis present

## 2011-11-01 DIAGNOSIS — O409XX0 Polyhydramnios, unspecified trimester, not applicable or unspecified: Secondary | ICD-10-CM | POA: Diagnosis present

## 2011-11-01 DIAGNOSIS — O99891 Other specified diseases and conditions complicating pregnancy: Secondary | ICD-10-CM | POA: Diagnosis present

## 2011-11-01 LAB — COMPREHENSIVE METABOLIC PANEL
ALT: 30 U/L (ref 0–35)
AST: 39 U/L — ABNORMAL HIGH (ref 0–37)
Albumin: 2.7 g/dL — ABNORMAL LOW (ref 3.5–5.2)
Alkaline Phosphatase: 93 U/L (ref 39–117)
BUN: 4 mg/dL — ABNORMAL LOW (ref 6–23)
CO2: 18 mEq/L — ABNORMAL LOW (ref 19–32)
Calcium: 9.4 mg/dL (ref 8.4–10.5)
Chloride: 100 mEq/L (ref 96–112)
Creatinine, Ser: 0.53 mg/dL (ref 0.50–1.10)
GFR calc Af Amer: >90 mL/min (ref >90)
GFR calc non Af Amer: >90 mL/min (ref >90)
Glucose, Bld: 211 mg/dL — ABNORMAL HIGH (ref 70–99)
Potassium: 3.7 mEq/L (ref 3.5–5.1)
Sodium: 132 mEq/L — ABNORMAL LOW (ref 135–145)
Total Bilirubin: 0.3 mg/dL (ref 0.3–1.2)
Total Protein: 6.4 g/dL (ref 6.0–8.3)

## 2011-11-01 LAB — URINALYSIS, ROUTINE W REFLEX MICROSCOPIC
Bilirubin Urine: NEGATIVE
Glucose, UA: >1000 mg/dL — AB
Ketones, ur: NEGATIVE mg/dL
Leukocytes, UA: NEGATIVE
Nitrite: NEGATIVE
Protein, ur: NEGATIVE mg/dL
Specific Gravity, Urine: 1.02 (ref 1.005–1.030)
Urobilinogen, UA: 0.2 mg/dL (ref 0.0–1.0)
pH: 6 (ref 5.0–8.0)

## 2011-11-01 LAB — URINE MICROSCOPIC-ADD ON

## 2011-11-01 LAB — CBC
HCT: 28.2 % — ABNORMAL LOW (ref 36.0–46.0)
Hemoglobin: 8.6 g/dL — ABNORMAL LOW (ref 12.0–15.0)
MCH: 17.8 pg — ABNORMAL LOW (ref 26.0–34.0)
MCHC: 30.5 g/dL (ref 30.0–36.0)
MCV: 58.4 fL — ABNORMAL LOW (ref 78.0–100.0)
Platelets: 273 10*3/uL (ref 150–400)
RBC: 4.83 MIL/uL (ref 3.87–5.11)
RDW: 17.3 % — ABNORMAL HIGH (ref 11.5–15.5)
WBC: 8.4 10*3/uL (ref 4.0–10.5)

## 2011-11-01 LAB — GLUCOSE, CAPILLARY: Glucose-Capillary: 216 mg/dL — ABNORMAL HIGH (ref 70–99)

## 2011-11-01 MED ORDER — LACTATED RINGERS IV SOLN
INTRAVENOUS | Status: DC
Start: 2011-11-01 — End: 2011-11-03
  Administered 2011-11-01: 19:00:00 via INTRAVENOUS
  Administered 2011-11-02: 125 mL/h via INTRAVENOUS
  Administered 2011-11-02 – 2011-11-03 (×3): via INTRAVENOUS

## 2011-11-01 MED ORDER — PANTOPRAZOLE SODIUM 40 MG PO TBEC
40.0000 mg | DELAYED_RELEASE_TABLET | ORAL | Status: DC
  Administered 2011-11-02 – 2011-11-05 (×4): 40 mg via ORAL
  Filled 2011-11-01 (×5): qty 1

## 2011-11-01 MED ORDER — FOLIC ACID 1 MG PO TABS
5.0000 mg | ORAL_TABLET | ORAL | Status: DC
  Administered 2011-11-02 – 2011-11-05 (×4): 5 mg via ORAL
  Filled 2011-11-01 (×5): qty 5

## 2011-11-01 MED ORDER — LEVETIRACETAM 500 MG PO TABS
2000.0000 mg | ORAL_TABLET | Freq: Two times a day (BID) | ORAL | Status: DC
  Administered 2011-11-02 – 2011-11-05 (×8): 2000 mg via ORAL

## 2011-11-01 MED ORDER — PROMETHAZINE HCL 25 MG PO TABS
25.0000 mg | ORAL_TABLET | Freq: Once | ORAL | Status: AC
  Administered 2011-11-01: 25 mg via ORAL
  Filled 2011-11-01: qty 1

## 2011-11-01 MED ORDER — DOCUSATE SODIUM 100 MG PO CAPS: 100.0000 mg | ORAL_CAPSULE | Freq: Every day | ORAL | Status: DC

## 2011-11-01 MED ORDER — PRENATAL MULTIVITAMIN CH
1.0000 | ORAL_TABLET | Freq: Every day | ORAL | Status: DC
  Administered 2011-11-01 – 2011-11-04 (×4): 1 via ORAL
  Filled 2011-11-01 (×5): qty 1

## 2011-11-01 MED ORDER — CALCIUM CARBONATE ANTACID 500 MG PO CHEW: 2.0000 | CHEWABLE_TABLET | ORAL | Status: DC | PRN

## 2011-11-01 MED ORDER — ACETAMINOPHEN 325 MG PO TABS
650.0000 mg | ORAL_TABLET | Freq: Once | ORAL | Status: AC
  Administered 2011-11-01: 650 mg via ORAL
  Filled 2011-11-01: qty 2

## 2011-11-01 MED ORDER — ZOLPIDEM TARTRATE 10 MG PO TABS
10.0000 mg | ORAL_TABLET | Freq: Every evening | ORAL | Status: DC | PRN
  Administered 2011-11-01 – 2011-11-03 (×3): 10 mg via ORAL
  Filled 2011-11-01 (×4): qty 1

## 2011-11-01 MED ORDER — ACETAMINOPHEN 325 MG PO TABS
650.0000 mg | ORAL_TABLET | ORAL | Status: DC | PRN
  Administered 2011-11-01 – 2011-11-05 (×7): 650 mg via ORAL
  Filled 2011-11-01 (×8): qty 2

## 2011-11-01 MED ORDER — GUAIFENESIN-CODEINE 100-10 MG/5ML PO SOLN
5.0000 mL | Freq: Four times a day (QID) | ORAL | Status: DC | PRN
  Administered 2011-11-01 – 2011-11-03 (×5): 5 mL via ORAL
  Filled 2011-11-01 (×5): qty 5

## 2011-11-01 MED ORDER — MENTHOL 3 MG MT LOZG
1.0000 | LOZENGE | OROMUCOSAL | Status: DC | PRN
  Filled 2011-11-01: qty 9

## 2011-11-01 MED ORDER — ONDANSETRON 8 MG PO TBDP
8.0000 mg | ORAL_TABLET | Freq: Three times a day (TID) | ORAL | Status: DC | PRN
  Administered 2011-11-01 – 2011-11-02 (×3): 8 mg via ORAL
  Filled 2011-11-01 (×3): qty 1

## 2011-11-01 MED ORDER — DOCUSATE SODIUM 100 MG PO CAPS
100.0000 mg | ORAL_CAPSULE | Freq: Every day | ORAL | Status: DC
  Administered 2011-11-01 – 2011-11-04 (×4): 100 mg via ORAL
  Filled 2011-11-01 (×4): qty 1

## 2011-11-01 MED ORDER — PRENATAL MULTIVITAMIN CH: 1.0000 | ORAL_TABLET | Freq: Every day | ORAL | Status: DC

## 2011-11-01 NOTE — MAU Note (Signed)
Sent here from Dr. Amanda Cockayne office with plans to admit to regulate blood sugar and care for N&V

## 2011-11-01 NOTE — Progress Notes (Deleted)
Report received from Erin Davis, RN 

## 2011-11-01 NOTE — Progress Notes (Signed)
Report received from Charlene Brooke, RN

## 2011-11-01 NOTE — Progress Notes (Signed)
History     CSN: [id number]  Arrival date & time 11/01/11  1513   First Provider Initiated Contact with Patient 11/01/11 1602      No chief complaint on file.   HPI 31 y.o. G3P0020 at [redacted]w[redacted]d sent from office for evaluation d/t recent diagnosis of GDM with uncontrolled blood sugars, beta thalassemia/anemia, frequent nosebleeds and worsening nausea and vomiting.   Past Medical History  Diagnosis Date  . Seizures   . Anemia   . Thalassemia   . Epilepsy   . Depression   . Diabetes mellitus     Past Surgical History  Procedure Date  . Tongue surgery   . Laparoscopy   . Carpal tunnel release   . Wisdom tooth extraction     Family History  Problem Relation Age of Onset  . Anesthesia problems Neg Hx   . Hypotension Neg Hx   . Malignant hyperthermia Neg Hx   . Pseudochol deficiency Neg Hx   . Diabetes Mother   . Hyperlipidemia Father   . Diabetes Maternal Grandmother   . Cancer Maternal Grandfather   . Hyperlipidemia Paternal Grandfather     History  Substance Use Topics  . Smoking status: Never Smoker   . Smokeless tobacco: Not on file  . Alcohol Use: No    OB History    Grav Para Term Preterm Abortions TAB SAB Ect Mult Living   3 0 0 0 2 1 1 0 0 0       Review of Systems  Constitutional: Negative.   HENT: Positive for nosebleeds.   Respiratory: Negative.   Cardiovascular: Negative.   Gastrointestinal: Positive for nausea and vomiting. Negative for abdominal pain, diarrhea and constipation.  Genitourinary: Negative for dysuria, urgency, frequency, hematuria, flank pain, vaginal bleeding, vaginal discharge, difficulty urinating, genital sores and menstrual problem.       Negative for contractions  Skin: Negative.   Neurological: Negative.   Psychiatric/Behavioral: Negative.     Allergies  Latex  Home Medications  No current outpatient prescriptions on file.  BP 132/63  Pulse 118  Temp(Src) 99.9 F (37.7 C) (Oral)  Resp 18  Ht 5\' 4"  (1.626 m)  Wt  291 lb (131.997 kg)  BMI 49.95 kg/m2  LMP 04/21/2011  Physical Exam  Nursing note and vitals reviewed. Constitutional: She is oriented to person, place, and time. She appears well-developed and well-nourished. No distress.  Cardiovascular: Normal rate.   Pulmonary/Chest: Effort normal.  Musculoskeletal: Normal range of motion.  Neurological: She is alert and oriented to person, place, and time.  Skin: Skin is warm and dry.  Psychiatric: She has a normal mood and affect.    MAU Course  Procedures (including critical care time)  Labs Reviewed  URINALYSIS, ROUTINE W REFLEX MICROSCOPIC - Abnormal; Notable for the following:    APPearance HAZY (*)    Glucose, UA >1000 (*)    Hgb urine dipstick TRACE (*)    All other components within normal limits  CBC - Abnormal; Notable for the following:    Hemoglobin 8.6 (*)    HCT 28.2 (*)    MCV 58.4 (*)    MCH 17.8 (*)    RDW 17.3 (*)    All other components within normal limits  COMPREHENSIVE METABOLIC PANEL - Abnormal; Notable for the following:    Sodium 132 (*)    CO2 18 (*)    Glucose, Bld 211 (*)    BUN 4 (*)    Albumin 2.7 (*)  AST 39 (*)    All other components within normal limits  URINE MICROSCOPIC-ADD ON - Abnormal; Notable for the following:    Squamous Epithelial / LPF MANY (*)    All other components within normal limits  ANTIBODY SCREEN  HIV ANTIBODY (ROUTINE TESTING)  ABO/RH  RUBELLA ANTIBODY, IGM  HEPATITIS B SURFACE ANTIGEN  URINE CULTURE   No results found.      A/P: 31 y.o. G3P0020 at [redacted]w[redacted]d GDM, Anemia Admit to ante per Dr. Paralee Cancel orders

## 2011-11-01 NOTE — H&P (Addendum)
Abigail Hall is a 31 y.o. female and 27-5/[redacted] weeks pregnant, [facility] 01/26/2012 presenting for nausea and vomiting for 2 days,14 pound weight weight loss, upper respiratory congestion with nosebleeds for one day, new diagnosis of gestational diabetes, uncontrolled, seizure disorder( controlled) and anemia. The patient reports having at least 2 nose bleeds on yesterday and presented to a hospital in [place] her she was told she had an elevated blood pressure and glucose. Her symptoms are resolved and she came to our agrees referral for further evaluation. She was seen in the ER maternity admission this event last night and the nosebleed had resolved, her blood pressure was and she was discharged to be followed up in the office today.  In the office today the patient complains of volume nausea and vomiting. Weight was measured at 270 lbs.  Previously weight had been stable at 284 lb over the previous month. Because of continued nausea and vomiting and new onset of weight loss and new diagnosis of diabetes the patient is admitted for diabetes control, treatment of possible dehydration, and maternal fetal medicine consult.  OB History    Grav Para Term Preterm Abortions TAB SAB Ect Mult Living   3 0 0 0 2 1 1 0 0 0      Past Medical History  Diagnosis Date  . Seizures   . Anemia   . Thalassemia   . Epilepsy   . Depression   . Diabetes mellitus    Past Surgical History  Procedure Date  . Tongue surgery   . Laparoscopy   . Carpal tunnel release   . Wisdom tooth extraction    Family History: family history includes Cancer in her maternal grandfather; Diabetes in her maternal grandmother and mother; and Hyperlipidemia in her father and paternal grandfather.  There is no history of Anesthesia problems, and Hypotension, and Malignant hyperthermia, and Pseudochol deficiency, . Social History:  reports that she has never smoked. She does not have any smokeless tobacco history on file. She reports  that she does not drink alcohol or use illicit drugs.  Review of systems as noted above  Allergies:  Latex and morphine   Blood pressure 132/63, pulse 118, temperature 99.9 F (37.7 C), temperature source Oral, resp. rate 18, height 5\' 4"  (1.626 m), weight 291 lb (131.997 kg), last menstrual period 04/21/2011.   Physical exam  HEENT exam is normal Chest is clear to auscultation and percussion Heart S1 and S2 is clear without murmur gallop or Abdomen  Bowel sounds are present. No masses palpated Fundal height 32 Back no CVA tenderness Extremities are normal without swelling or discoloration Pelvic exam is deferred  Prenatal labs: ABO, Rh: O/Positive/-- (10/30 0000) Antibody: Negative (10/30 0000) Rubella:   RPR:    HBsAg: Negative (10/30 0000)  HIV: Non-reactive (10/30 0000)  GBS:     Assessment/Plan: 27 5 week pregnancy with nausea vomiting documented weight loss Gestational diabetes uncontrolled Seizure disorder, controlled.   Epistaxis Thalassemia minor Anemia Obesity Latex allergy  Plan Admit for diabetes control and IV hydration CBC, CMP Check fasting and two-hour p.c. glucoses Zofran 8 mg ODT every 8 hours as needed for nausea and vomiting Continue prenatal vitamins Continue Keppra 2000 mg twice a day as prescribed by her neurologist. The patient sees Dr. Roseanne Reno at [facility] Patient is high risk and has been name maternal-fetal medicine Department. Plan follow up consult to maternal-fetal medicine.   Fortino Sic 11/01/2011, 6:29 PM

## 2011-11-02 ENCOUNTER — Inpatient Hospital Stay (HOSPITAL_COMMUNITY): Payer: Managed Care, Other (non HMO)

## 2011-11-02 ENCOUNTER — Inpatient Hospital Stay (HOSPITAL_COMMUNITY)
Admission: RE | Admit: 2011-11-02 | Discharge: 2011-11-02 | Disposition: A | Discharge status: Home / Self Care | Payer: Managed Care, Other (non HMO) | Source: Ambulatory Visit | Attending: Obstetrics and Gynecology | Admitting: Obstetrics and Gynecology

## 2011-11-02 DIAGNOSIS — O09891 Supervision of other high risk pregnancies, first trimester: Secondary | ICD-10-CM

## 2011-11-02 LAB — DIFFERENTIAL
Basophils Absolute: 0 10*3/uL (ref 0.0–0.1)
Basophils Absolute: 0 10*3/uL (ref 0.0–0.1)
Basophils Relative: 0 % (ref 0–1)
Basophils Relative: 0 % (ref 0–1)
Eosinophils Absolute: 0 10*3/uL (ref 0.0–0.7)
Eosinophils Absolute: 0 10*3/uL (ref 0.0–0.7)
Eosinophils Relative: 0 % (ref 0–5)
Eosinophils Relative: 0 % (ref 0–5)
Lymphocytes Relative: 20 % (ref 12–46)
Lymphocytes Relative: 25 % (ref 12–46)
Lymphs Abs: 1.6 10*3/uL (ref 0.7–4.0)
Lymphs Abs: 2.1 10*3/uL (ref 0.7–4.0)
Monocytes Absolute: 0.8 10*3/uL (ref 0.1–1.0)
Monocytes Absolute: 0.7 10*3/uL (ref 0.1–1.0)
Monocytes Relative: 10 % (ref 3–12)
Monocytes Relative: 9 % (ref 3–12)
Neutro Abs: 5.7 10*3/uL (ref 1.7–7.7)
Neutro Abs: 5.5 10*3/uL (ref 1.7–7.7)
Neutrophils Relative %: 70 % (ref 43–77)
Neutrophils Relative %: 65 % (ref 43–77)

## 2011-11-02 LAB — HEMOGLOBIN A1C
Hgb A1c MFr Bld: 5.3 % (ref <5.7)
Mean Plasma Glucose: 105 mg/dL (ref <117)

## 2011-11-02 LAB — CBC
HCT: 26.7 % — ABNORMAL LOW (ref 36.0–46.0)
HCT: 24.7 % — ABNORMAL LOW (ref 36.0–46.0)
Hemoglobin: 8 g/dL — ABNORMAL LOW (ref 12.0–15.0)
Hemoglobin: 7.4 g/dL — ABNORMAL LOW (ref 12.0–15.0)
MCH: 17.5 pg — ABNORMAL LOW (ref 26.0–34.0)
MCH: 17.6 pg — ABNORMAL LOW (ref 26.0–34.0)
MCHC: 30 g/dL (ref 30.0–36.0)
MCHC: 30 g/dL (ref 30.0–36.0)
MCV: 58.4 fL — ABNORMAL LOW (ref 78.0–100.0)
MCV: 58.7 fL — ABNORMAL LOW (ref 78.0–100.0)
Platelets: 265 10*3/uL (ref 150–400)
Platelets: 250 10*3/uL (ref 150–400)
RBC: 4.57 MIL/uL (ref 3.87–5.11)
RBC: 4.21 MIL/uL (ref 3.87–5.11)
RDW: 17.2 % — ABNORMAL HIGH (ref 11.5–15.5)
RDW: 17.2 % — ABNORMAL HIGH (ref 11.5–15.5)
WBC: 8.1 10*3/uL (ref 4.0–10.5)
WBC: 8.4 10*3/uL (ref 4.0–10.5)

## 2011-11-02 LAB — GLUCOSE, CAPILLARY
Glucose-Capillary: 178 mg/dL — ABNORMAL HIGH (ref 70–99)
Glucose-Capillary: 233 mg/dL — ABNORMAL HIGH (ref 70–99)
Glucose-Capillary: 181 mg/dL — ABNORMAL HIGH (ref 70–99)

## 2011-11-02 LAB — RAPID STREP SCREEN (MED CTR MEBANE ONLY): Streptococcus, Group A Screen (Direct): NEGATIVE

## 2011-11-02 MED ORDER — AZITHROMYCIN 500 MG PO TABS
500.0000 mg | ORAL_TABLET | Freq: Every day | ORAL | Status: AC
  Administered 2011-11-03: 500 mg via ORAL
  Filled 2011-11-02: qty 1

## 2011-11-02 MED ORDER — AZITHROMYCIN 250 MG PO TABS
250.0000 mg | ORAL_TABLET | Freq: Every day | ORAL | Status: DC
  Administered 2011-11-04 – 2011-11-05 (×2): 250 mg via ORAL
  Filled 2011-11-02 (×3): qty 1

## 2011-11-02 MED ORDER — FLUTICASONE PROPIONATE 50 MCG/ACT NA SUSP
2.0000 | Freq: Every day | NASAL | Status: DC
  Administered 2011-11-02 – 2011-11-05 (×4): 2 via NASAL
  Filled 2011-11-02: qty 16

## 2011-11-02 NOTE — Progress Notes (Signed)
MFM Note  Abigail Hall is a 31 year old [id number] AA female at 27+6 weeks who was admitted yesterday for nausea and vomiting, several recent nose bleeds, newly diagnosed gestational DM and anemia. She has done relatively well until this past weekend when she began to have N&V and nose bleeds. She also c/o upper respiratory congestion and a sore throat. An ultrasound performed ~ 3 weeks ago showed a large-for-gestational age fetus with mild polyhydramnios. A subsequent glucola was diagnostic for gestational diabetes. Ms. 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  Since admission, she has been on IVFs and Zofran. Her N&V has gradually improved. She still has congestion and a sore throat and had a low grade temperature. She has received nutritional counseling for the GDM. Blood sugars have been elevated between 181 and 233.  [place] today: mild polyhydramnios; EFW at the 89th %tile; anatomy survey complete  Labs of note: H/H 8.6/28.2; 8.0/26.7 A1c 5.3! 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

## 2011-11-02 NOTE — Progress Notes (Signed)
  Nutrition Dx: Food and nutrition-related knowledge deficit r/t no previous education aeb newly diagnosed GDM.   Nutrition education consult for Carbohydrate Modified Gestational Diabetic Diet completed.  "Meal  plan for gestational diabetics" handout given to patient.  Basic concepts reviewed.  Questions answered.  Patient verbalizes understanding.   It should be noted that patient was ordered wrong diet order at breakfast this morning ( 8 AM )  and received more than the typical amount of carbs at breakfast. Diet order corrected and changed to Gestational carbohydrate modified.  At the time of my visit, 4 - 4:30 she had not yet ordered lunch. Stressed need to order meals  and snacks on a consistent basis,  Leaving 2 - 3 hours between each meal and snack.

## 2011-11-02 NOTE — Progress Notes (Addendum)
Admitted November 01, 2011  S: 27 6/7 weeks, complains of sore throat, nausea this morning followed by small nosebleed, controlled with nasal pressure  O: Blood pressure 130/67, pulse 132, temperature 100.4 F (38 C), temperature source Oral, resp. rate 20, height 5\' 4"  (1.626 m), weight 291 lb (131.997 kg), last menstrual period 04/21/2011. Throat minimal erythema, no exudates or tonsillar swelling Chest clear S1 and S2 clear BS  present  Ext normal Pelvic deferred  FHT: within normal limits No complaints of labor, abdominal pain  A/P- 31 y.o. admitted with: diabetes, nosebleed, nasal congestion. Seizure disorder, seizure x 1 this pregnancy. Keppra increased without further recurrence. Throat culture sent this am. Repeat CBC today  Low grade temp and tachycardia.  Will maintain IV fluids for treatment of dehydration Urine culture pending.   Will await MFM recommendation on T & C and transfusion.  MFM consult today Diabetes management nurse to see this am.  Start insulin today. HB A1C pending.      Patient Active Hospital Problem List: No active hospital problems.   Pregnancy Complications: gestational diabetes and low grade fever, anemia, thallasemia minor, seizure disorder, nasal congestion, epistaxis, mild, obesity   Dating:  [redacted]w[redacted]d

## 2011-11-02 NOTE — Progress Notes (Addendum)
ADA Standards of Care 2012:  Dabetes in Pregnancy Target Ranges: Fasting - 60-90 mg/dL  Preprandial- 16-109 mg/dL Postprandial < 604 mg/dL (7.8 mmol/L)   Inpatient Diabetes Program Recommendations  Reason for Visit: Diabetes Coordinator consult  Inpatient Diabetes Program Recommendations HgbA1C: value is 5.3 %.  Would not start insulin at this  point, as pt just started on gestational carb modified diet Outpatient Referral: Please order OP education for gestational diabetes at [facility]  Note:  Pt has no ketones in urine thus far. Due to patient starting gestational carb modifed diet today, I would not recommend starting insulin at this point but instead review glucose results today after being on correct diet and reasss tomorrow am as to need for medication/insulin.  I will order education per bedside RN to teach monitoring (if not already done) and to assist pt with watching videos on gestational diabetes care. I will come over to [name] tomorrow am to talk with pt and assess medication needs and talk with patient.  RD consult has been ordered.  Thank you, Ledell Noss, CNS, CDE [phone])

## 2011-11-02 NOTE — Progress Notes (Signed)
Dr Neva Seat called for update. Notified pt seen in MFM, mild-mod poly and risk of LGA seen on u/s; Lenor Coffin left note that she would see pt tomorrow, no insulin needed at this time since pt just started on CHO mod diet.

## 2011-11-02 NOTE — Progress Notes (Signed)
Pt called out c/o stuffy nose and throat. Wanting oxygen. Lungs sound clear to auscultation. SpO2 applied, reading on monitor shows 100% sats and pulse of 115-120.

## 2011-11-02 NOTE — Progress Notes (Signed)
UR Chart review completed.  

## 2011-11-03 ENCOUNTER — Inpatient Hospital Stay (HOSPITAL_COMMUNITY): Payer: Managed Care, Other (non HMO)

## 2011-11-03 LAB — CBC
HCT: 26 % — ABNORMAL LOW (ref 36.0–46.0)
Hemoglobin: 7.8 g/dL — ABNORMAL LOW (ref 12.0–15.0)
MCH: 17.6 pg — ABNORMAL LOW (ref 26.0–34.0)
MCHC: 30 g/dL (ref 30.0–36.0)
MCV: 58.8 fL — ABNORMAL LOW (ref 78.0–100.0)
Platelets: 255 10*3/uL (ref 150–400)
RBC: 4.42 MIL/uL (ref 3.87–5.11)
RDW: 17.2 % — ABNORMAL HIGH (ref 11.5–15.5)
WBC: 7.3 10*3/uL (ref 4.0–10.5)

## 2011-11-03 LAB — HEMOGLOBIN A1C
Hgb A1c MFr Bld: 7.9 % — ABNORMAL HIGH (ref <5.7)
Mean Plasma Glucose: 180 mg/dL — ABNORMAL HIGH (ref <117)

## 2011-11-03 LAB — GLUCOSE, CAPILLARY
Glucose-Capillary: 168 mg/dL — ABNORMAL HIGH (ref 70–99)
Glucose-Capillary: 192 mg/dL — ABNORMAL HIGH (ref 70–99)
Glucose-Capillary: 198 mg/dL — ABNORMAL HIGH (ref 70–99)
Glucose-Capillary: 135 mg/dL — ABNORMAL HIGH (ref 70–99)

## 2011-11-03 LAB — DIFFERENTIAL
Basophils Absolute: 0 10*3/uL (ref 0.0–0.1)
Basophils Relative: 0 % (ref 0–1)
Eosinophils Absolute: 0.1 10*3/uL (ref 0.0–0.7)
Eosinophils Relative: 1 % (ref 0–5)
Lymphocytes Relative: 27 % (ref 12–46)
Lymphs Abs: 2 10*3/uL (ref 0.7–4.0)
Monocytes Absolute: 0.6 10*3/uL (ref 0.1–1.0)
Monocytes Relative: 8 % (ref 3–12)
Neutro Abs: 4.6 10*3/uL (ref 1.7–7.7)
Neutrophils Relative %: 64 % (ref 43–77)

## 2011-11-03 LAB — URINALYSIS, ROUTINE W REFLEX MICROSCOPIC
Bilirubin Urine: NEGATIVE
Glucose, UA: 250 mg/dL — AB
Hgb urine dipstick: NEGATIVE
Ketones, ur: 40 mg/dL — AB
Leukocytes, UA: NEGATIVE
Nitrite: NEGATIVE
Protein, ur: NEGATIVE mg/dL
Specific Gravity, Urine: <1.005 — ABNORMAL LOW (ref 1.005–1.030)
Urobilinogen, UA: 0.2 mg/dL (ref 0.0–1.0)
pH: 6.5 (ref 5.0–8.0)

## 2011-11-03 MED ORDER — INSULIN NPH (HUMAN) (ISOPHANE) 100 UNIT/ML ~~LOC~~ SUSP
18.0000 [IU] | Freq: Two times a day (BID) | SUBCUTANEOUS | Status: DC
  Filled 2011-11-03: qty 10

## 2011-11-03 MED ORDER — INSULIN ASPART 100 UNIT/ML ~~LOC~~ SOLN: 6.0000 [IU] | Freq: Three times a day (TID) | SUBCUTANEOUS | Status: DC

## 2011-11-03 MED ORDER — BD GETTING STARTED TAKE HOME KIT: 3/10ML X 30G SYRINGES
1.0000 | Freq: Once | Status: DC
  Filled 2011-11-03: qty 1

## 2011-11-03 MED ORDER — AZITHROMYCIN 500 MG PO TABS
500.0000 mg | ORAL_TABLET | Freq: Every day | ORAL | Status: AC
  Administered 2011-11-03: 500 mg via ORAL
  Filled 2011-11-03: qty 1

## 2011-11-03 MED ORDER — PSEUDOEPHEDRINE HCL 30 MG PO TABS
30.0000 mg | ORAL_TABLET | Freq: Two times a day (BID) | ORAL | Status: DC
  Administered 2011-11-03 – 2011-11-05 (×6): 30 mg via ORAL
  Filled 2011-11-03 (×6): qty 1

## 2011-11-03 MED ORDER — INSULIN ASPART 100 UNIT/ML ~~LOC~~ SOLN: 0.0000 [IU] | Freq: Every morning | SUBCUTANEOUS | Status: DC

## 2011-11-03 MED ORDER — INSULIN ASPART 100 UNIT/ML ~~LOC~~ SOLN: 4.0000 [IU] | Freq: Three times a day (TID) | SUBCUTANEOUS | Status: DC

## 2011-11-03 MED ORDER — PROMETHAZINE HCL 25 MG PO TABS
25.0000 mg | ORAL_TABLET | Freq: Four times a day (QID) | ORAL | Status: DC | PRN
  Administered 2011-11-05: 25 mg via ORAL
  Filled 2011-11-03: qty 1

## 2011-11-03 MED ORDER — GLYBURIDE 2.5 MG PO TABS
2.5000 mg | ORAL_TABLET | Freq: Two times a day (BID) | ORAL | Status: DC
  Administered 2011-11-03 – 2011-11-05 (×5): 2.5 mg via ORAL
  Filled 2011-11-03 (×6): qty 1

## 2011-11-03 NOTE — Progress Notes (Signed)
Hospital Day: 3  S: The patient states she is to give blood transfusion today. I told her I was not sure of this at the time. Since that time I've asked maternal fetal medicine to see her again today to help develop a plan of care. Also went ahead and released the orders for the diabetic educator so the patient can get started with improved control on split dose insulin regimen. The patient states she has not had a nosebleed and 24 hours. Patient states that she normally has nosebleeds when she does have some nausea and vomiting. She had an episode of nausea and vomiting  last night that prevented her from eating dinner. She was then subsequently able to eat [place] sandwich and several other food items. I'm concerned that maybe she is trying to eat too much to one particular time considering the delayed gastric emptying during pregnancy.c/o sore throat  O: Blood pressure 128/56, pulse 112, temperature 98 F (36.7 C), temperature source Oral, resp. rate 18, height 5\' 4"  (1.626 m), weight 135.444 kg (298 lb 9.6 oz), last menstrual period 04/21/2011, SpO2 98.00%.   [id number] monitoring at this point Toco: None SVE:  deferred Appears well sitting up eating breakfast  Neck no lymphadenopathy no masses HEENT no plaques in oropharnyx Chest clear to auscultation bilaterally CV regular rate and rhythm Abdomen gravid nontender no right or quadrant tenderness Extremities no Homans, trace edema, no cords nontender    A/P- 31 y.o. admitted with: Assessment:  1) IUP at 32 & 0/7 weeks  2) Newly diagnosed, uncontrolled GDM the note per diabetic educator was appreciated and patient will be started on insulin today based on this information 3) Nausea and vomiting - had an episode and was unable to eat dinner but was able to eat a significant snack of a [place] sandwich among several other food items. 4) Sore throat and congestion with slight fever to start  on azithromycin 5) epistaxis -she states she  has not had one in last 24 hours and normally does when she elects a nausea and vomiting episode. 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  7) LGA fetus with polyhydramnios  8) Seizure disorder on Keppra - dose recently increased by neurologist  PLANS: 1) Continue check BSs while on low carb diet; will most likely need insulin; depending on BSs would use Lantus and Humalog based on weight - we would be happy to help with this  2) will add Phenergan for nausea  3) will add phenergan and azithromycin x 5 days    4) transfusion based on MFM recs to not transfuse at this time and will add folic acid  5) will consider ENT help if nose bleeds continue 6) will ask for more detailed plan of care and agree with sugar goals per diabetic educator    Patient Active Hospital Problem List: No active hospital problems.   Pregnancy Complications: gestational diabetes, infection with polyhydramnios and seizure disorder and anemia and nausea and vomiting and epistaxis and LGA and    Preterm labor management: no treatment necessary Dating:  [redacted]w[redacted]d

## 2011-11-03 NOTE — Progress Notes (Signed)
Interval note:  I spoke with Dr. Deeann Hall by telephone today regarding  [name] regarding the MFM consult.  Assessment:  1) IUP at 28 & 0/7 weeks discussed plan of care 2) A2DM poorly controlled with diet, in the patient does not want to start insulin injections and has had some confusion about the possibility of starting by mouth medications per one of her visits with the several providers in consultations. We have agreed that it would not hurt to initiate by mouth glyburide to see if she could possibly improve her glucose control. We will also initiate interval growth scans approximately 4 weeks and twice weekly nonstress tests and once weekly AFI measurement.  Also use hemoglobin A1c's for monitoring.  I am not sure the patient will be able to use a sliding scale once an insulin regimen will be used.  We'll also continue with the improvement in her diet but I'm not sure of her potential for compliance as referenced above.    3) Nausea and vomiting - had an episode and was unable to eat dinner but was able to eat a significant snack of a [place] sandwich among several other food items.  no reported episodes today thus far   4) Sore throat and congestion with slight fever to start on azithromycin   5) epistaxis -she states she has not had one in last 24 hours and normally does when she elects a nausea and vomiting episode.  Dr. 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  We agree that a genetics consult may be in order for tomorrow. We'll also try to get the father of the baby to get a hemoglobin electrophoresis completed.  We have increased her folate. There has been some confusion much providers with regard to whether she was here transfusion. At this time we will delay the transfusion in hopes that the patient doesn't have any further bleeding    7) LGA fetus with polyhydramnios   8) Seizure disorder on Keppra - dose recently increased by neurologist - continue new regimen per the neurologist  PLANS:  1) Continue check BSs while on low carb diet; will most likely need insulin;will start glyburide 2.5 mg twice a day. Monitor her sugars with increasing her glyburide per her toleration and per her glucose measurement   4) transfusion based on MFM recs to not transfuse at this time  5) will consider ENT help if nose bleeds continue

## 2011-11-03 NOTE — Consult Note (Signed)
Diabetes Treatment Program Recommendations  ADA Standards of Care 2012 Diabetes in Pregnancy Target Glucose Ranges:  Fasting: 60 - 90 mg/dL Preprandial: 60 - 161 mg/dL 1 hr postprandial: Less than 140mg /dL (from first bite of meal) 2 hr postprandial: Less than 120 mg/dL (from first bit of meal)   Reviewed glucose results from yesterday after using gestational carb modified diet. CBG's still elevated indicating need for both basal and bolus insulin.  Using protocol in epic on wt based/gest age per Jovanovic, wrote orders for NPH doses am and pm.  Wrote orders for basal NPH and Novolog meal coverage as well as individualized correction scales for fasting cbg's as well as 2 hr post-prandial cbg's.  Can then use the correction doses to add to the set doses.  I will order an insulin starter kit for pt to learn how to draw up and administer insulin. RD consult already ordered as well as ed'l videos.  Will see patient today if possible.  Will follow daily and assist with any adjusments made.  Thank you, Lenor Coffin, RN, Diabetes CNS [phone])

## 2011-11-03 NOTE — Progress Notes (Signed)
11-03-11 Diabetes Clinical Nurse Specialist of the Diabetes Inpatient Program Earlier entered order for insulin based on gestational guidelines per ADA.  NPH and Novolog/Humalog are the current insulins of choice to use.  Lantus has not been approved to use during pregnancy. It does not have a peak to cover the 3-4am glucose elevations that typically occur during pregnancy due to rise in growth and placental hormones. Doubtful that oral agent will cover her insulin needs, as pt is large and most probably very resistant. I am glad to re-enter the insulin orders/recs written this am.  Especially concerned about glucose control if patient does indeed have infection. Lenor Coffin, RN, CNS, CDE [phone])

## 2011-11-04 LAB — URINE CULTURE
Colony Count: >=100000
Culture  Setup Time: 201303190235

## 2011-11-04 LAB — TSH: TSH: 1.552 u[IU]/mL (ref 0.350–4.500)

## 2011-11-04 LAB — CBC
HCT: 26.9 % — ABNORMAL LOW (ref 36.0–46.0)
Hemoglobin: 8.1 g/dL — ABNORMAL LOW (ref 12.0–15.0)
MCH: 17.6 pg — ABNORMAL LOW (ref 26.0–34.0)
MCHC: 30.1 g/dL (ref 30.0–36.0)
MCV: 58.6 fL — ABNORMAL LOW (ref 78.0–100.0)
Platelets: 290 10*3/uL (ref 150–400)
RBC: 4.59 MIL/uL (ref 3.87–5.11)
RDW: 17.1 % — ABNORMAL HIGH (ref 11.5–15.5)
WBC: 7.5 10*3/uL (ref 4.0–10.5)

## 2011-11-04 LAB — GLUCOSE, CAPILLARY
Glucose-Capillary: 125 mg/dL — ABNORMAL HIGH (ref 70–99)
Glucose-Capillary: 110 mg/dL — ABNORMAL HIGH (ref 70–99)
Glucose-Capillary: 133 mg/dL — ABNORMAL HIGH (ref 70–99)
Glucose-Capillary: 130 mg/dL — ABNORMAL HIGH (ref 70–99)
Glucose-Capillary: 98 mg/dL (ref 70–99)

## 2011-11-04 LAB — IRON AND TIBC
Iron: 20 ug/dL — ABNORMAL LOW (ref 42–135)
Saturation Ratios: 5 % — ABNORMAL LOW (ref 20–55)
TIBC: 420 ug/dL (ref 250–470)
UIBC: 400 ug/dL (ref 125–400)

## 2011-11-04 LAB — FOLATE: Folate: >20 ng/mL

## 2011-11-04 LAB — RETICULOCYTES
RBC.: 4.59 MIL/uL (ref 3.87–5.11)
Retic Count, Absolute: 128.5 10*3/uL (ref 19.0–186.0)
Retic Ct Pct: 2.8 % (ref 0.4–3.1)

## 2011-11-04 LAB — VITAMIN B12: Vitamin B-12: 561 pg/mL (ref 211–911)

## 2011-11-04 LAB — FERRITIN: Ferritin: 119 ng/mL (ref 10–291)

## 2011-11-04 MED ORDER — POLYETHYLENE GLYCOL 3350 17 G PO PACK
17.0000 g | PACK | Freq: Every day | ORAL | Status: DC
  Administered 2011-11-05: 17 g via ORAL
  Filled 2011-11-04 (×2): qty 1

## 2011-11-04 MED ORDER — FERROUS SULFATE 325 (65 FE) MG PO TABS
325.0000 mg | ORAL_TABLET | Freq: Two times a day (BID) | ORAL | Status: DC
  Administered 2011-11-04: 325 mg via ORAL
  Filled 2011-11-04 (×2): qty 1

## 2011-11-04 NOTE — Progress Notes (Signed)
Hospital Day: 4  S: states she is doing better and would like to make sure I knew the diet she had yesterday and that she is now aware of the plan of care please see below.  She intimated to me that her life is stressful and that this is going to be very hard for her to do to deal with the diabetes and the seizures and the pregnancy and work.  I let her know that we will see her in a week to determine if her conditions are stable for her enough to go back to work and recommended working with her case worker and with boss and church and friends  O: Blood pressure 129/78, pulse 107, temperature 98 F (36.7 C), temperature source Oral, resp. rate 18, height 5\' 4"  (1.626 m), weight 132.768 kg (292 lb 11.2 oz), last menstrual period 04/21/2011, SpO2 98.00%.   WUJ:WJXBJYNW: 140s bpm intermittent doptones Toco: no c/o SVE: deferred  A/P- 31 y.o. admitted with:  Assessment:  1) IUP at 66 & 1/7 weeks   2) A2DM poorly controlled with diet, 11-03-11 Hgb A1c 7.9,trial of glyburide has seemed to start working with FBS 110 today and pp are 130 and are started to make headway with diet and teaching.  interval growth scans approximately 4 weeks and  twice weekly nonstress tests and  once weekly AFI measurement.  hemoglobin A1c's for monitoring.  Consider insulin in the future  I am not sure the patient will be able to use a sliding scale once an insulin regimen will be used.  monitor compliance  (this was reemphasized and the Diabetic teaching nurse was in the room at that time as well to reinforce compliance)   3) Nausea and vomiting -improved and told her to not force feed the whole meal as this is counterproductive if she is having emesis  4) Sore throat and congestion with slight fever on azithromycin d#3  5) epistaxis -she states she has she had one last night . Dr. 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 We agree that a genetics consult may be in order for tomorrow. We'll also try to get the father of the baby to get a hemoglobin electrophoresis completed. We have increased her folate. There has been some confusion much providers with regard to whether she was here transfusion. At this time we will delay the transfusion in hopes that the patient doesn't have any further bleeding  7) LGA fetus with polyhydramnios  8) Seizure disorder on Keppra - dose recently increased by neurologist - continue new regimen per the neurologist    PLANS: The below plans were readdressed with the patient and will probably go home tomorrow  1) Continue check BSs while on low carb diet; will most likely need insulin;will start glyburide 2.5 mg twice a day. Monitor her sugars with increasing her glyburide per her toleration and per her glucose measurement  2) start on iron bid as I am not sure all of the anemia is related to blood loss and thalassemia  4) transfusion based on MFM recs to not transfuse at this time  5) will call ENT to help with nose bleeds  Patient Active Hospital Problem List: No active hospital problems.  Pregnancy Complications: gestational diabetes and seizure disorder and obesity and anemia and epistaxis and URI and LGA and polyhydramnios  Preterm labor management: no treatment necessary  Dating: [redacted]w[redacted]d

## 2011-11-04 NOTE — Plan of Care (Signed)
This patient discussing severe frustration on not being aware of what the poc is. At bedside educating patient on nosebleeds and what to do in the event of a recurring episode, and diabetes management. Reassuring patient that Lenor Coffin, diabetes coordinator and Dr Christell Constant would be in to discuss further plans and give her more information. Pt doesn't request anything else at this time.

## 2011-11-04 NOTE — Progress Notes (Signed)
Discussing with Dr Christell Constant pt frustration with POC. Pt feels she is lacking information, and feels overwhelmed by everything going on and that communication has been poor. Informed Dr Christell Constant that Lenor Coffin, diabetes coordinator is coming at 1600 to discuss diabetes, and that I have discussed POC with pt and gone over information/pamphlets. Requesting Dr Christell Constant to explain POC with pt.

## 2011-11-04 NOTE — Progress Notes (Signed)
Inpatient Diabetes Program Recommendations  Diabetes Treatment Program Recommendations  ADA Standards of Care 2012 Diabetes in Pregnancy Target Glucose Ranges:  Fasting: 60 - 90 mg/dL Preprandial: 60 - 161 mg/dL 1 hr postprandial: Less than 140mg /dL (from first bite of meal) 2 hr postprandial: Less than 120 mg/dL (from first bit of meal)    Reason for Visit: Consult per RN Spoke with Leotis Shames, RN today regarding patient refusal to take insulin.  After reviewing her glucose levels today, I feel hopeful that with diet and glyburide, pt may be able to control her glucose.  My only suggestion would be to move the pm dose to 10pm in order to cover her early morning glucose excursions when the placental hormones are causing the most resistance.  At 2.5 mg glyburide, there is still room to increase the dose as/if needed both am and pm.  Will visit patient late afternoon to talk with her and encourage to do what she needs to do to control her glucose and deliver a healthy baby.  Inpatient Diabetes Program Recommendations Oral Agents: Noted Diabeta started at 2.5 mg bid.  Would be helpful to use the pm dose at bedtime to cover the early am glucose peaks due to insulin resistance from hormones.  HgbA1C: xxx Outpatient Referral: Please order OP education for gestational diabetes at [facility]  Note: Thank you, Lenor Coffin, RN, CNS, Diabetes Coordinator [phone])

## 2011-11-04 NOTE — Progress Notes (Signed)
Hospital Day: 4  S:States she is still congested and that she is getting some sleep but having trouble staying asleep and states she had no episodes of emesis yesterday and only had one nose bleed and understands why we are not giving a transfusion  O: Blood pressure 128/57, pulse 106, temperature 98 F (36.7 C), temperature source Oral, resp. rate 18, height 5\' 4"  (1.626 m), weight 135.444 kg (298 lb 9.6 oz), last menstrual period 04/21/2011, SpO2 98.00%.   WUJ:WJXBJYNWGNFA doptones for now Toco: no c/o SVE: not performed Appears well but is resting and aroused easily and uterus not tender and lungs clear at this time and legs without edema or tenderness  A/P- 31 y.o. admitted with:  Assessment:   1) IUP at 28 & 1/7 weeks 2) A2DM poorly controlled with diet, 11-03-11 Hgb A1c 7.9,trial of glyburide has seemed to start working with FBS 110 today and are started to make headway with diet and teaching.   interval growth scans approximately 4 weeks and  twice weekly nonstress tests and once weekly AFI measurement. hemoglobin A1c's for monitoring. Consider insulin in the future  I am not sure the patient will be able to use a sliding scale once an insulin regimen will be used.   monitor compliance  3) Nausea and vomiting -improved and told her to not force feed the whole meal as this is counterproductive if she is having emesis 4) Sore throat and congestion with slight fever on azithromycin d#3 5) epistaxis -she states she has she had one last night  . Dr. 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 We agree that a genetics consult may be in order for tomorrow. We'll also try to get the father of the baby to get a hemoglobin electrophoresis completed. We have increased her folate. There has been some confusion much providers with regard to whether she  was here transfusion. At this time we will delay the transfusion in hopes that the patient doesn't have any further bleeding  7) LGA fetus with polyhydramnios  8) Seizure disorder on Keppra - dose recently increased by neurologist - continue new regimen per the neurologist    PLANS:  1) Continue check BSs while on low carb diet; will most likely need insulin;will start glyburide 2.5 mg twice a day. Monitor her sugars with increasing her glyburide per her toleration and per her glucose measurement  2) start on iron bid as I am not sure all of the anemia is related to blood loss and thalassemia 4) transfusion based on MFM recs to not transfuse at this time  5) will call ENT to help with nose bleeds     Patient Active Hospital Problem List: No active hospital problems.   Pregnancy Complications: gestational diabetes and seizure disorder and obesity and anemia and epistaxis and URI and LGA and polyhydramnios  Preterm labor management: no treatment necessary Dating:  [redacted]w[redacted]d PNL Needed:

## 2011-11-04 NOTE — Progress Notes (Signed)
UR Chart review completed.  

## 2011-11-04 NOTE — Consult Note (Signed)
Have talked with patient and reviewed educational materials regarding carbohydrate counting, need for small and frequent meals and why, potential for complications with baby, etc.  Listened to patient voice her concerns over not understanding what was being done and ordered, getting the wrong meal tray yesterday at breakfast, not getting her snacks, and suddenly being told she would have to have insulin.  Pt. Appears much less anxious with thorough explanations from myself and Dr. Christell Constant.  I have given this patient my contact information and am glad to answer further questions.  Pt would benefit from OP education on gestational diabetes. At discharge, pt will need prescription for a cbg meter and strips to test 4 times per day, fasting and 1 hr after each meal.  Blood sugar goals discussed with patient and written down in notes.  Explained the action of Glyburide and to eat within 30 mins after given.  Dr. Christell Constant extremely helpful in explaining details to patient and put this patient at ease. Please call if I can be of any further assistance. Lenor Coffin, RN, CNS, Diabetes Coordinator [phone])

## 2011-11-05 LAB — CREATININE CLEARANCE, URINE, 24 HOUR
Collection Interval-CRCL: 24 hours
Creatinine Clearance: 209 mL/min — ABNORMAL HIGH (ref 75–115)
Creatinine, 24H Ur: 1594 mg/d (ref 700–1800)
Creatinine, Urine: 101.22 mg/dL
Creatinine: 0.53 mg/dL (ref 0.50–1.10)
Urine Total Volume-CRCL: 1575 mL

## 2011-11-05 LAB — GLUCOSE, CAPILLARY
Glucose-Capillary: 90 mg/dL (ref 70–99)
Glucose-Capillary: 79 mg/dL (ref 70–99)
Glucose-Capillary: 130 mg/dL — ABNORMAL HIGH (ref 70–99)
Glucose-Capillary: 125 mg/dL — ABNORMAL HIGH (ref 70–99)

## 2011-11-05 MED ORDER — DSS 100 MG PO CAPS: 100.0000 mg | ORAL_CAPSULE | Freq: Three times a day (TID) | ORAL | Status: AC | PRN

## 2011-11-05 MED ORDER — FERROUS SULFATE 325 (65 FE) MG PO TABS: 325.0000 mg | ORAL_TABLET | Freq: Every day | ORAL | Status: DC

## 2011-11-05 MED ORDER — GLYBURIDE 2.5 MG PO TABS: 2.5000 mg | ORAL_TABLET | Freq: Two times a day (BID) | ORAL | Status: DC

## 2011-11-05 MED ORDER — FOLIC ACID 1 MG PO TABS: 5.0000 mg | ORAL_TABLET | ORAL | Status: AC

## 2011-11-05 MED ORDER — AZITHROMYCIN 250 MG PO TABS: ORAL_TABLET | ORAL | Status: AC

## 2011-11-05 MED ORDER — FLUTICASONE PROPIONATE 50 MCG/ACT NA SUSP: 2.0000 | Freq: Every day | NASAL | Status: DC

## 2011-11-05 MED ORDER — ZOLPIDEM TARTRATE 5 MG PO TABS: 10.0000 mg | ORAL_TABLET | Freq: Every evening | ORAL | Status: DC | PRN

## 2011-11-05 NOTE — Progress Notes (Signed)
Social worker paged and then lengthy message left on cell phone to see pt today before d/c re: transportation arrangements [id number].

## 2011-11-05 NOTE — Progress Notes (Signed)
Frequent Fetal movement paplated during doppler.

## 2011-11-05 NOTE — Progress Notes (Signed)
Social worker at bs

## 2011-11-05 NOTE — Discharge Instructions (Addendum)
Gestational Diabetes Mellitus Gestational diabetes mellitus (GDM) is diabetes that occurs only during pregnancy. This happens when the body cannot properly handle the glucose (sugar) that increases in the blood after eating. During pregnancy, insulin resistance (reduced sensitivity to insulin) occurs because of the release of hormones from the placenta. Usually, the pancreas of pregnant women produces enough insulin to overcome the resistance that occurs. However, in gestational diabetes, the insulin is there but it does not work effectively. If the resistance is severe enough that the pancreas does not produce enough insulin, extra glucose builds up in the blood.  WHO IS AT RISK FOR DEVELOPING GESTATIONAL DIABETES?  Women with a history of diabetes in the family.   Women over age 25.   Women who are overweight.   Women in certain ethnic groups (Hispanic, African American, Native American, Asian and Pacific Islander).  WHAT CAN HAPPEN TO THE BABY? If the mother's blood glucose is too high while she is pregnant, the extra sugar will travel through the umbilical cord to the baby. Some of the problems the baby may have are:  Large Baby - If the baby receives too much sugar, the baby will gain more weight. This may cause the baby to be too large to be born normally (vaginally) and a Cesarean section (C-section) may be needed.   Low Blood Glucose (hypoglycemia) - The baby makes extra insulin, in response to the extra sugar its gets from its mother. When the baby is born and no longer needs this extra insulin, the baby's blood glucose level may drop.   Jaundice (yellow coloring of the skin and eyes) - This is fairly common in babies. It is caused from a build-up of the chemical called bilirubin. This is rarely serious, but is seen more often in babies whose mothers had gestational diabetes.  RISKS TO THE MOTHER Women who have had gestational diabetes may be at higher risk for some problems,  including:  Preeclampsia or toxemia, which includes problems with high blood pressure. Blood pressure and protein levels in the urine must be checked frequently.   Infections.   Cesarean section (C-section) for delivery.   Developing Type 2 diabetes later in life. About 30-50% will develop diabetes later, especially if obese.  DIAGNOSIS  The hormones that cause insulin resistance are highest at about 24-28 weeks of pregnancy. If symptoms are experienced, they are much like symptoms you would normally expect during pregnancy.  GDM is often diagnosed using a two part method: 1. After 24-28 weeks of pregnancy, the woman drinks a glucose solution and takes a blood test. If the glucose level is high, a second test will be given.  2. Oral Glucose Tolerance Test (OGTT) which is 3 hours long - After not eating overnight, the blood glucose is checked. The woman drinks a glucose solution, and hourly blood glucose tests are taken.  If the woman has risk factors for GDM, the caregiver may test earlier than 24 weeks of pregnancy. TREATMENT  Treatment of GDM is directed at keeping the mother's blood glucose level normal, and may include:  Meal planning.   Taking insulin or other medicine to control your blood glucose level.   Exercise.   Keeping a daily record of the foods you eat.   Blood glucose monitoring and keeping a record of your blood glucose levels.   May monitor ketone levels in the urine, although this is no longer considered necessary in most pregnancies.  HOME CARE INSTRUCTIONS  While you are pregnant:    Follow your caregiver's advice regarding your prenatal appointments, meal planning, exercise, medicines, vitamins, blood and other tests, and physical activities.   Keep a record of your meals, blood glucose tests, and the amount of insulin you are taking (if any). Show this to your caregiver at every prenatal visit.   If you have GDM, you may have problems with hypoglycemia (low  blood glucose). You may suspect this if you become suddenly dizzy, feel shaky, and/or weak. If you think this is happening and you have a glucose meter, try to test your blood glucose level. Follow your caregiver's advice for when and how to treat your low blood glucose. Generally, the 15:15 rule is followed: Treat by consuming 15 grams of carbohydrates, wait 15 minutes, and recheck blood glucose. Examples of 15 grams of carbohydrates are:   1 cup skim or low-fat milk.    cup juice.   3-4 glucose tablets.   5-6 hard candies.   1 small box raisins.    cup regular soda pop.   Practice good hygiene, to avoid infections.   Do not smoke.  SEEK MEDICAL CARE IF:   You develop abnormal vaginal discharge, with or without itching.   You become weak and tired more than expected.   You seem to sweat a lot.   You have a sudden increase in weight, 5 pounds or more in one week.   You are losing weight, 3 pounds or more in a week.   Your blood glucose level is high, and you need instructions on what to do about it.  SEEK IMMEDIATE MEDICAL CARE IF:   You develop a severe headache.   You faint or pass out.   You develop nausea and vomiting.   You become disoriented or confused.   You have a convulsion.   You develop vision problems.   You develop stomach pain.   You develop vaginal bleeding.   You develop uterine contractions.   You have leaking or a gush of fluid from the vagina.  AFTER YOU HAVE THE BABY:  Go to all of your follow-up appointments, and have blood tests as advised by your caregiver.   Maintain a healthy lifestyle, to prevent diabetes in the future. This includes:   Following a healthy meal plan.   Controlling your weight.   Getting enough exercise and proper rest.   Do not smoke.   Breastfeed your baby if you can. This will lower the chance of you and your baby developing diabetes later in life.  For more information about diabetes, go to the American  Diabetes Association at: www.americandiabetesassociation.org. For more information about gestational diabetes, go to the American Congress of Obstetricians and Gynecologists at: www.acog.org. Document Released: 11/08/2000 Document Revised: 07/22/2011 Document Reviewed: 06/02/2009 Exit[employer] Patient Information 2012 [employer]. 

## 2011-11-05 NOTE — Progress Notes (Signed)
1305 pt introduced me to her sister and friend.  I asked if sister could go to [facility] and pick up pt's meter. Pt said no-it would not be nice to ask her to do that after driving all the way from [place] and she was not really her sister and she wanted to go herself and have the pharmacy educate her. Offered to provide the education myself and she said no she wanted to go to the pharmacy herself.  Reminded pt of the pharmacy hours that i had looked up for her. She stated the social worker already told her that info and she was tired of being given so much conflicting info since her admission. The info I gave her was exactly as she was given by the SW.  I told her I was trying to make sure she had all the info and help she needed early so she did not get discharged and not have all the equipment and education she needed to care for herself. Pt seemed frustrated by my attempt to help her.

## 2011-11-05 NOTE — Discharge Summary (Signed)
Physician Discharge Summary  Patient ID: Abigail Hall MRN: [id number] DOB/AGE: 03/20/1981 31 y.o.  Admit date: 11/01/2011 Discharge date: 11/05/2011  Admission Diagnoses:  Poorly controlled gestational diabetes newly diagnosed Seizure disorder   Anemia Thalassemia Obesity Epistaxis URI Multiple social stressors   Discharge Diagnoses:  Active Problems:  * No active hospital problems. *   Same plus better controlled gestational diabetes  Discharged Condition: good  Hospital Course: The patient was admitted with poorly controlled gestational diabetes and anemia with some tachycardia. Hemoglobin A1c was 7.9 and diabetic education teaching counseling nutrition was performed initiation of oral agent glyburide at 2.5 mg twice a day seem to initiate some improved control. Maternal fetal medicine was involved with regard to the anemia and diabetes and the decision was made to not perform a blood transfusion. The patient did have epistaxis for which we have pursued in an ENT consult on an outpatient basis. There was 2-3 bouts of epistaxis which seem to be associated with her congestion the first 2-3 days of admission. This didn't seem to improve. Is initiated on a Z-Pak, Flonase. Once these issues of better diabetic control and education the patient was performed patient was discharged home. Ms. Clubb's last hemoglobin was 8.1 and iron studies were performed. It did not reveal a significant deficiency and iron TSH was performed was within normal limits. The patient also had two good night to sleep prior to discharge. I appreciate the diabetic education and nursing care during her stay. As this was instrumental in her clinical improvement.  Consults: Maternal fetal medicine and nutrition and diabetic education  Significant Diagnostic Studies: Ultrasound and labs  Treatments: antibiotics: azithromycin, glyburide, iron replacement, symptom control  Discharge Exam: Blood pressure  90/79, pulse 106, temperature 97.9 F (36.6 C), temperature source Oral, resp. rate 18, height 5\' 4"  (1.626 m), weight 130.182 kg (287 lb), last menstrual period 04/21/2011, SpO2 98.00%. General appearance: alert, cooperative, moderately obese and morbidly obese Resp: clear to auscultation bilaterally Cardio: regular rate and rhythm, S1, S2 normal, no murmur, click, rub or gallop GI: soft, non-tender; bowel sounds normal; no masses,  no organomegaly Extremities: Homans sign is negative, no sign of DVT and no edema, redness or tenderness in the calves or thighs  Disposition: 01-Home or Self Care  Discharge Orders    Future Appointments: Provider: Department: Dept Phone: Center:   11/24/2011 2:00 PM [facility] [place] [facility] [phone] [facility]     Future Orders Please Complete By Expires   Discharge instructions      Comments:   Call with problems and f/u in one week call on Monday to schedule appt for next week and monitor blood sugars 4 times per day as instructed and blood sugar breakfast approximately 30 minutes before breakfast taken her morning clipper I8 breakfast 1 hour after breakfast take a capillary love glucose, 8 lunch 1 after lunch take a capillary blood glucose, take second glyburide 30 minutes before dinner and then eat dinner and 1 hour after dinner take a capillary blood glucose. Repeat this every day and notify me if blood sugars are about 220. Record this on May sheet. Please take your medication for procedure disorder as instructed. Please complete the 2 days of a Z-Pak that is an antibiotic for your upper respiratory infection. Please try not to stimulate the nose to cause any nosebleeds. Please call the office on Monday to schedule your followup appointment and remind them to get the appointment for your ENT visit. I'll see next week  in the office to get your work note.   PRETERM LABOR:  Includes any of the follwing symptoms that occur between 20 - [redacted] weeks gestation.  If  these symptoms are not stopped, preterm labor can result in preterm delivery, placing your baby at risk      Notify physician for menstrual like cramps      Notify physician for uterine contractions.  These may be painless and feel like the uterus is tightening or the baby is  "balling up"      Notify physician for low, dull backache, unrelieved by heat or Tylenol      Notify physician for intestinal cramps, with or without diarrhea, sometimes described as "gas pain"      Notify physician for pelvic pressure      Notify physician for increase or change in vaginal discharge      Notify physician for vaginal bleeding      Notify physician for a general feeling that "something is not right"      Notify physician for leaking of fluid      Fetal Kick Count:  Lie on our left side for one hour after a meal, and count the number of times your baby kicks.  If it is less than 5 times, get up, move around and drink some juice.  Repeat the test 30 minutes later.  If it is still less than 5 kicks in an hour, notify your doctor.      Discharge activity:  No Restrictions      Discharge diet:      Scheduling Instructions:   Per the diabetic educator as per instructions and education to the patient regarding a modified carb diet   HIV antibody      Comments:   This external order was created through the Results Console.   Rubella antibody, IgM      Comments:   This external order was created through the Results Console.   Hepatitis B surface antigen      Comments:   This external order was created through the Results Console.   Antibody screen      Comments:   This external order was created through the Results Console.   ABO/Rh      Comments:   This external order was created through the Results Console.     Medication List  As of 11/05/2011  4:53 PM   STOP taking these medications         diphenhydramine-acetaminophen 25-500 MG Tabs         TAKE these medications         acetaminophen 500 MG  tablet   Commonly known as: TYLENOL   Take 1,000 mg by mouth every 6 (six) hours as needed. For pain      azithromycin 250 MG tablet   Commonly known as: ZITHROMAX   Only needs one per day for 2 more days   Start taking on: 11/06/2011      DSS 100 MG Caps   Take 100 mg by mouth 3 (three) times daily as needed for constipation.      ferrous sulfate 325 (65 FE) MG tablet   Take 1 tablet (325 mg total) by mouth daily with breakfast.      fluticasone 50 MCG/ACT nasal spray   Commonly known as: FLONASE   Place 2 sprays into the nose daily.      folic acid 1 MG tablet   Commonly known as: [employer]  Take 5 tablets (5 mg total) by mouth daily.      glyBURIDE 2.5 MG tablet   Commonly known as: DIABETA   Take 1 tablet (2.5 mg total) by mouth 2 (two) times daily with a meal.      guaiFENesin-codeine 100-10 MG/5ML syrup   Commonly known as: ROBITUSSIN AC   Take 5-10 mLs by mouth at bedtime as needed for cough.      hydrocortisone 25 MG suppository   Commonly known as: ANUSOL-HC   Place 25 mg rectally 2 (two) times daily as needed. Once at bedtime and once after bow movement if needed      levETIRAcetam 500 MG tablet   Commonly known as: KEPPRA   Take 2,000 mg by mouth every 12 (twelve) hours.      magnesium hydroxide 400 MG/5ML suspension   Commonly known as: MILK OF MAGNESIA   Take 15 mLs by mouth daily as needed. For constipation      ondansetron 4 MG disintegrating tablet   Commonly known as: ZOFRAN-ODT   Take 4 mg by mouth every 8 (eight) hours as needed. As needed for acid reflux      pantoprazole 40 MG tablet   Commonly known as: PROTONIX   Take 40 mg by mouth daily.      prenatal multivitamin Tabs   Take 1 tablet by mouth daily.      promethazine 25 MG tablet   Commonly known as: PHENERGAN   Take 25 mg by mouth daily as needed. For nausea      sodium chloride 0.65 % nasal spray   Commonly known as: OCEAN   Place 1 spray into the nose as needed for congestion.        VITAMIN B6 PO   Take 1 tablet by mouth daily.      zolpidem 5 MG tablet   Commonly known as: AMBIEN   Take 2 tablets (10 mg total) by mouth at bedtime as needed. As needed for insomnia           Follow-up Information    Follow up with Delbert Harness., MD. Schedule an appointment as soon as possible for a visit in 1 week.   Contact information:   [address], [address] [facility] [employer] [place] [address] [phone]          Signed: Delbert Harness 11/05/2011, 4:53 PM

## 2011-11-05 NOTE — Progress Notes (Signed)
C/O dizzyness with intermittent disorientation after blowing nose.  Pt crying and stating she doesn't know whats going on with her. Feels similar to aura experience before seizure.  Not witnessed by RN.  Denies any nose bleed.  Vomiting pt took pnv between 00:30 and 1:00 am.    Fall risk initiated.

## 2011-11-05 NOTE — Progress Notes (Signed)
Entered room and spoke to pt, pt continued to sleep soundly. Refilled water pitcher, added new ice pitcher and wrote time on board I entered room.

## 2011-11-05 NOTE — Progress Notes (Signed)
Social worker continues at bs.

## 2011-11-05 NOTE — Progress Notes (Signed)
1130 dr Christell Constant called and states pt could be discharged between 1800-2000 and 24 hour urine sent between any of these times. Dr Christell Constant notified pt has already stated she does not want to go home and social worker currently at bs working out transportation issues.  Dr Christell Constant states there is no medical reason for pt to stay and once urine is obtained she can be d/c'd to home and she can get cbg meter at [facility] per Nelle Don that has been called to bw picked up  tonight or in am.  Pt has Ambien rx in her hand (seen at pt's bedside).

## 2011-11-05 NOTE — Progress Notes (Signed)
Clinical Social Work Department ANTENATAL PSYCHOSOCIAL ASSESSMENT 11/05/2011  Patient:  Abigail Hall, Abigail Hall   Account Number:  [id number]  Admit Date:  11/01/2011     DOB:  [birth year]   Age:  31 Gestational age on admission:  28     Expected delivery date:  01/26/2012 Admitting diagnosis:   Gestational Diabetes    Clinical Social Worker:  Lulu Riding,  [place]  Date/Time:  11/05/2011 10:40 AM  FAMILY/HOME ENVIRONMENT  Home address:   [address], [place], [place] [address]   Household Member/Support Name Relationship Age  Matthew Folks SIGNIFICANT OTHER    Other support:   Coworkers, church family, Mother and other family and friends who live in [place].     PSYCHOSOCIAL DATA  Information source:  Patient Interview Other information source:   Patient's chart, bedside RN    Resources:   Employment:   Patient is employed at [employer] of [place]  FOB works in Chief Financial Officer (county):  [employer]  School:     Current grade:    Homebound arranged?      Cultural/Environmental issues impacting care:   none known    STRENGTHS / WEAKNESSES / FACTORS TO CONSIDER  Concerns related to hospitalization:   Patient feels she has not been informed on her care plan. She states her diet orders were incorrect while she was in the hospital and she was given medication and didn't know the reason.  She does not feel comfortable using a blood sugar meter that she has not been instructed on how to use. She feels like there has been a lack of communication between her providers and does not know when her next doctor's appointment is.   Previous pregnancies/feelings towards pregnancy?  Concerns related to being/becoming a mother?   She is ecstatic about becoming a mother and states that she will be doing much better once she is no longer pregnant. She states the pregnancy has definitely taken a toll on her body.   Social support (FOB? Who is/will be helping with baby/other kids)   FOB is her  boyfriend.  She reports having a god relationship and states that he is involved and supportive. Her family is also very supportive, but they live in [place].   Couples relationship:   Patient reports having a good relationship with FOB.   Recent stressful life events (life changes in past year?):   Patient has a seizure disorder that has been well controlled since 2009.  She reports having a seizure that she did not remember earlier this month.  This has caused her to not be able to drive, which has been a huge stressor.   Prenatal care/education/home preparations?   Patient is preparing for baby at home with assistance from FOB.   Domestic violence (of any type):  N If yes to domestic violence describe/action plan:  Substance use during pregnancy.  (If YES, complete SBIRT):  N  Complete PHQ-9 (Depression Screening) on all antenatal patients.  PHQ-9 score:    (IF SCORE => 15 complete TREAT)  Follow up recommendations:   Patient is not showing depressive symptoms at this time, but states she has experienced depression in the past.  She reports that she had a counselor when she was younger, but does not think this person is still in practive.  She frequently referred to feelings of anxiety and feeling overwhelmed.  SW recommends outpatient counseling, which patient agreed to.  She does not have a preference of where she would like to  seek treatment.  SW called [facility] to inquire about whether or not they accept [employer].  Counselor states that she does not at this time, but hopes that this will change as soon as Wednesday of next week.  SW will follow up and make referral if she is able to accept patient's insurance.  SW will assist patient in finding another provider at that time if counselor is unable to take her insurance.   Patient advised/response?   Patient seemed extremely appreciative of SW's interventions.  She stated that she felt comfortable with the plan because she felt  like SW was really listening to her concerns and trying to help her resolve some of her issues.   Other:    Clinical Assessment/Plan SW met with patient for over an hour today.  SW used active listening and a strength based approach with patient.  SW validated patient's feelings and could visibly see her demeanor change and attitude soften.  SW assisted patient in making a list of questions she has for the doctor before discharge today and encouraged her to not leave an appointment without having her questions answered and knowing the plan.  SW assisted patient in asking about the glucose meter she will be picking up from the pharmacy and encouraged her to ask questions and be allowed to demonstrate using it before she leaves the pharmacy.  SW also advised that she make sure she has a number to call if she has questions when she gets home.  SW asked patient to list her strengths and try to focus on them.  SW spoke about the power of positive thinking and also advised that she make a list of the things in her life that she can control and the things she cannot and try to let go other those things that are out of her control.  This is all in attempts to keep her focused and less stressed.  SW also encouraged patient awareness of the issues that have already been resolved and encouraged her to not dwell on those things.  SW encouraged her to ask her supports for help when she needs it.  Patient states that she is always welcomed at her mother's house and plans to go there for support when needed.  SW to assist patient in seeking outpatient counseling.  Patient seemed comfortable with the plan, understands that she will be discharged today and has transportation this evening at discharge.

## 2011-11-05 NOTE — Progress Notes (Signed)
Hospital Day: 5  S: resting and have reviewed nurses notes and events.  I spent one hour with patient last night   O: Blood pressure 121/51, pulse 106, temperature 98 F (36.7 C), temperature source Oral, resp. rate 18, height 5\' 4"  (1.626 m), weight 132.768 kg (292 lb 11.2 oz), last menstrual period 04/21/2011, SpO2 98.00%.   FHT:NST pending [place]oco: no complaints and NST pending SVE: deferred    A/P- 31 y.o. admitted with: to control sugars Assessment:  1) IUP at 28 & 2/7 weeks  2) A2DM poorly controlled with diet, 11-03-11 Hgb A1c 7.9,trial of glyburide has seemed to start working with FBS 110 today and pp are 130 and are started to make headway with diet and teaching. And the most 2 recent sugars were in the 90s interval growth scans approximately 4 weeks and  twice weekly nonstress tests and  once weekly AFI measurement.  hemoglobin A1c's for monitoring.  Consider insulin in the future  I am not sure the patient will be able to use a sliding scale once an insulin regimen will be used.  monitor compliance  (this was reemphasized and the Diabetic teaching nurse was in the room at that time as well to reinforce compliance especially during the hour plus long meeting last night)  3) Nausea and vomiting -improved and told her to not force feed the whole meal as this is counterproductive if she is having emesis -- and it is unclear what is causing the emesis per the recent episode  4) Sore throat and congestion with slight fever that has been afebrile for @48  hrs on azithromycin d#4  5) epistaxis -none in the last 24 hous . 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 We agree that a genetics consult may be in order for tomorrow. We'll also try to get the father of the baby to get a hemoglobin electrophoresis completed. We have increased her folate. There has been some confusion much providers with regard to whether she was here  transfusion. At this time we will delay the transfusion in hopes that the patient doesn't have any further bleeding and we discussed physilogic anemia and reducing the fe to one per day 7) LGA fetus with polyhydramnios  8) Seizure disorder on Keppra - dose recently increased by neurologist - continue new regimen per the neurologist  9) insomnia and the patient states that this has been a huge problem for her and I have agree to [place] which works for her at the 10mg  dose   PLANS: The below plans are clear with the patient - however the 24 our urine did not get started until last evening at 8pm  1) Continue check BSs while on low carb diet; will most likely need insulin;will start glyburide 2.5 mg twice a day. Monitor her sugars with increasing her glyburide per her toleration and per her glucose measurement  2) start on iron bid as I am not sure all of the anemia is related to blood loss and thalassemia  4) transfusion based on MFM recs to not transfuse at this time  5) will call ENT to help with nose bleeds 6) Rx for ambien 10mg  given to patient  9) it is unclear if we will be able to discharge her today with diabetic supplies and 24 hour urine collection Patient Active Hospital Problem List: No active hospital problems.  Pregnancy Complications: gestational diabetes and seizure disorder and  obesity and anemia and epistaxis and URI and LGA and polyhydramnios  Preterm labor management: no treatment necessary  Dating: [redacted]w[redacted]d    Patient Active Hospital Problem List: No active hospital problems.

## 2011-11-05 NOTE — Progress Notes (Signed)
Dr Christell Constant called unit. Notified pt planning and progressing toward her d/c at 1800. She has made her own appt w/mfm next week. She has plans in am to pick up meter at [facility] sat am.  Pt has already called and planned for family to pick her ap at 1800.  Pt is also getting info re: all meds to take upon d/c and foods ok to eat on diabetic diet.

## 2011-11-05 NOTE — Progress Notes (Signed)
D/c'd to home via WC.

## 2011-11-05 NOTE — Progress Notes (Signed)
1740 spoke with pharmacist and order clarified re" how often pt checks blood sugars per md orders in EPIC. Now that this info is clarified, pharmacist states this is all that is needed expect for pt ad insurance ID card in am to teach pt and give pt strips and meter. 1800 meds given and rx's given to pt and instructions reviewed for colace, ambien, iron, folic acid, azithromax. Pt's own bottle of kepra w/ one tablet returned to her. 1815 dr Dion Body called and d/c instructions reconciled in EPIC.

## 2011-11-05 NOTE — Progress Notes (Signed)
Came into room with morning meds, glucometer, and water, introduced myself and I was here to see her and see how she was doing and care for her today. Pt did not stir. Pt resting quietly on right side. Sleeping. I emptied her urine.  I then wrote my name on the board and set the glucometer on the bedside.  I was aware she did not sleep well during the night and had received phenergan for nausea.  She continued to sleep. I wrote a note on the board that I had been in the room and she should call out when she woke up.

## 2011-11-06 LAB — PROTEIN, URINE, 24 HOUR
Collection Interval-UPROT: 24 hours
Protein, 24H Urine: 126 mg/d — ABNORMAL HIGH (ref 50–100)
Protein, Urine: 8 mg/dL
Urine Total Volume-UPROT: 1575 mL

## 2011-11-08 NOTE — Progress Notes (Signed)
Follow up MFM consult completed 11/03/11.  Notes did not transfer into the medical record.  These were transcribed into [facility].  Please see full report there for further recommendations.  Patient discussed with Dr. Christell Constant on the day of consultation.

## 2011-11-19 ENCOUNTER — Telehealth (INDEPENDENT_AMBULATORY_CARE_PROVIDER_SITE_OTHER): Payer: Self-pay | Admitting: General Surgery

## 2011-11-19 NOTE — Telephone Encounter (Signed)
If possible to move up appt made for 12/12/28 to fill in MD schedule, please call her.  She has painful hems, is pregnant and they are bleeding sometimes.  She reports her hemeglobin has been dropping some, too.

## 2011-11-24 ENCOUNTER — Other Ambulatory Visit: Payer: Self-pay | Admitting: Obstetrics and Gynecology

## 2011-11-24 ENCOUNTER — Ambulatory Visit (HOSPITAL_COMMUNITY)
Admission: RE | Admit: 2011-11-24 | Discharge: 2011-11-24 | Disposition: A | Discharge status: Home / Self Care | Payer: Managed Care, Other (non HMO) | Source: Ambulatory Visit | Attending: Obstetrics and Gynecology | Admitting: Obstetrics and Gynecology

## 2011-11-24 VITALS — BP 107/66 | HR 106 | Wt 285.0 lb

## 2011-11-24 DIAGNOSIS — G40909 Epilepsy, unspecified, not intractable, without status epilepticus: Secondary | ICD-10-CM

## 2011-11-24 DIAGNOSIS — O9989 Other specified diseases and conditions complicating pregnancy, childbirth and the puerperium: Secondary | ICD-10-CM

## 2011-11-24 DIAGNOSIS — O409XX0 Polyhydramnios, unspecified trimester, not applicable or unspecified: Secondary | ICD-10-CM | POA: Insufficient documentation

## 2011-11-24 DIAGNOSIS — O24419 Gestational diabetes mellitus in pregnancy, unspecified control: Secondary | ICD-10-CM

## 2011-11-24 DIAGNOSIS — E669 Obesity, unspecified: Secondary | ICD-10-CM | POA: Insufficient documentation

## 2011-11-24 DIAGNOSIS — O99810 Abnormal glucose complicating pregnancy: Secondary | ICD-10-CM | POA: Insufficient documentation

## 2011-11-24 NOTE — Progress Notes (Signed)
Obstetric ultrasound performed today.    LGA fetus Normal amniotic fluid volume Reassuring interval anatomy BPP 8/8  Repeat ultrasound scheduled in 4 weeks to re evaluate fetal growth and amniotic fluid volume.  Recommend antenatal testing with either twice weekly NSTs or weekly BPP until delivery.  Recommend evaluating patient for transition to insulin therapy for management of gestational diabetes.    Please see full report in [facility]

## 2011-11-25 ENCOUNTER — Telehealth: Payer: Self-pay | Admitting: Hematology & Oncology

## 2011-11-25 NOTE — Telephone Encounter (Signed)
Pt and referring aware of 4-19 appointment

## 2011-12-03 ENCOUNTER — Ambulatory Visit: Payer: Managed Care, Other (non HMO)

## 2011-12-03 ENCOUNTER — Ambulatory Visit (HOSPITAL_BASED_OUTPATIENT_CLINIC_OR_DEPARTMENT_OTHER): Payer: Managed Care, Other (non HMO) | Admitting: Hematology & Oncology

## 2011-12-03 ENCOUNTER — Other Ambulatory Visit (HOSPITAL_BASED_OUTPATIENT_CLINIC_OR_DEPARTMENT_OTHER): Payer: Managed Care, Other (non HMO) | Admitting: Lab

## 2011-12-03 VITALS — BP 108/61 | HR 98 | Temp 96.7°F | Ht 64.0 in | Wt 287.0 lb

## 2011-12-03 DIAGNOSIS — O99891 Other specified diseases and conditions complicating pregnancy: Secondary | ICD-10-CM

## 2011-12-03 DIAGNOSIS — D649 Anemia, unspecified: Secondary | ICD-10-CM

## 2011-12-03 DIAGNOSIS — D563 Thalassemia minor: Secondary | ICD-10-CM

## 2011-12-03 DIAGNOSIS — D569 Thalassemia, unspecified: Secondary | ICD-10-CM

## 2011-12-03 DIAGNOSIS — D509 Iron deficiency anemia, unspecified: Secondary | ICD-10-CM

## 2011-12-03 LAB — CBC WITH DIFFERENTIAL (CANCER CENTER ONLY)
BASO#: 0 10*3/uL (ref 0.0–0.2)
BASO%: 0.3 % (ref 0.0–2.0)
EOS%: 1.3 % (ref 0.0–7.0)
Eosinophils Absolute: 0.1 10*3/uL (ref 0.0–0.5)
HCT: 28.3 % — ABNORMAL LOW (ref 34.8–46.6)
HGB: 8.9 g/dL — ABNORMAL LOW (ref 11.6–15.9)
LYMPH#: 2 10*3/uL (ref 0.9–3.3)
LYMPH%: 25.1 % (ref 14.0–48.0)
MCH: 18 pg — ABNORMAL LOW (ref 26.0–34.0)
MCHC: 31.4 g/dL — ABNORMAL LOW (ref 32.0–36.0)
MCV: 57 fL — ABNORMAL LOW (ref 81–101)
MONO#: 0.5 10*3/uL (ref 0.1–0.9)
MONO%: 6.7 % (ref 0.0–13.0)
NEUT#: 5.3 10*3/uL (ref 1.5–6.5)
NEUT%: 66.6 % (ref 39.6–80.0)
Platelets: 349 10*3/uL (ref 145–400)
RBC: 4.95 10*6/uL (ref 3.70–5.32)
RDW: 19 % — ABNORMAL HIGH (ref 11.1–15.7)
WBC: 7.9 10*3/uL (ref 3.9–10.0)

## 2011-12-03 LAB — CHCC SATELLITE - SMEAR

## 2011-12-03 LAB — TECHNOLOGIST REVIEW CHCC SATELLITE

## 2011-12-03 NOTE — Progress Notes (Signed)
CC:   Arlyce Harman, MD Evie Lacks, MD  DIAGNOSES: 1. Microcytic anemia, likely iron deficient. 2. History of "thalassemia." 3. Third trimester of her 1st pregnancy.  HISTORY OF PRESENT ILLNESS:  Abigail Hall is a very nice 31 year old [name].  She is originally from [place], [place]. [place] years.  [facility] is seen [facility].  Dr. Neva Seat is her obstetrician. She is at 32 weeks with her 1st pregnancy.  She has never had any prior pregnancies and no miscarriages.  Dr. Neva Seat has done lab work on her.  She noted that Abigail Hall has had issues with anemia.  Back on 10/21/2011, a CBC was done which showed a white cell count of 8.7, hemoglobin 8.8, hematocrit 29.9, platelet count 382.  MCV was 58.  Her RDW was elevated.  She had the requisite viral titers.  HIV 1 and 2 were unremarkable.  A CBC was repeated about 3 weeks later.  This still showed anemia with a hemoglobin of 8.8, hematocrit 29.9.  MCV was still low at 58.  Platelets were elevated at 457.  She does have some gestational diabetes.  Her baby is doing well.  It is a baby boy.  She has already named him Malachi.  He is due on June 12th.  Abigail Hall said that there likely will be a C-section.  Abigail Hall has had iron before.  She says that oral iron has not really worked well.  She says she gets constipated with this.  She has never had any IV iron.  She says that thalassemia is on her mother's side.  She says that her father has sickle cell trait.  She says that her sister has both sickle cell and thalassemia.  Abigail Hall says that she takes 5 mg a day of folic acid.  She has had no problems with "crises."  She has had no issues with her pregnancy outside of a nosebleed, I think a month or so ago.  She did have hypertension.  She did not have toxemia.  She was hospitalized for this.  She does have chronic seizure disorder.   She sees Dr. Sandria Manly of [facility].  She is on Keppra for this.  I think her last seizure was within the year.  She said that she had a seizure because she was not sleeping for about 5 days.  She does chew a lot of ice.  She does not have any obvious cravings.  She has had no issues with morning sickness during this pregnancy.  Again, we are asked to see [name] to see about helping with the anemia and trying to optimize her hemoglobin prior to her delivering her child.  PAST MEDICAL HISTORY:  Remarkable for: 1. Chronic seizure disorder. 2. Gestational diabetes. 3. Gastroesophageal reflux disease. 4. Insomnia.  ALLERGIES:  Latex.  MEDICATIONS:  Flonase nasal spray 2 sprays daily, folic acid 5 mg p.o. daily, glyburide 2.5 mg p.o. b.i.d., Keppra 2000 mg p.o. b.i.d., Protonix 40 mg p.o. daily, prenatal vitamins daily, vitamin B6 50 mg p.o. daily, Ambien 10 mg p.o. q.h.s. p.r.n.,  Zofran 4 mg p.o. q.8 hours p.r.n.  SOCIAL HISTORY:  Negative for tobacco use.  There was some alcohol use prior to her pregnancy.  She says she enjoyed red wine on occasion. There are no obvious occupational exposures.  She was working for [employer] of [place] prior to the pregnancy.  FAMILY HISTORY:  Remarkable  for: 1. Diabetes. 2. Sickle cell trait. 3. Thalassemia.  (I assume beta thalassemia.) 4. Hypertension.  REVIEW OF SYSTEMS:  As stated in history of present illness.  No additional findings were noted on a 12-system review.  PHYSICAL EXAMINATION:  General:  This is a pregnant, somewhat obese black female in no obvious distress.  Vital Signs:  Show a temperature of 96.9, pulse 98, respiratory rate 18, blood pressure 108/61, weight is 287.  Head and Neck Exam:  Shows a normocephalic, atraumatic skull. There is no scleral icterus.  Conjunctivae are slightly pale.  There are no oral lesions.  There is no adenopathy in her neck.  Thyroid is not palpable.  Lungs:  Clear bilaterally.  There  may be some slight decrease at the bases.  No wheezes or rhonchi are noted.  Cardiac Exam:  Regular rate and rhythm with a normal S1 and S2.  There are no murmurs, rubs, or bruits.  Abdominal Exam:  Soft with good bowel sounds.  There is no palpable abdominal mass.  There is no palpable hepatosplenomegaly.  She is pregnant.  Back Exam:  No tenderness over the spine, ribs, or hips. Extremities:  Show no clubbing, cyanosis, or edema.  There may be some slight nonpitting edema of the right leg.  She has good pulses in her distal extremities.  Skin Exam:  Shows no rashes, ecchymosis, or petechia.  Neurological Exam:  Shows no focal neurological deficits.  LABORATORY STUDIES:  White cell count is 7.9, hemoglobin 8.9, hematocrit 28.3, platelet count 349.  MCV is 57.  RDW is 19.  Peripheral smear shows moderate anisocytosis and poikilocytosis.  She has polychromasia.  She has some target cells.  There are definitely microcytic red cells.  She has hypochromic red cells.  I see no nucleated red blood cells.  She may have a couple of schistocytes. There is no rouleaux formation.  I see no inclusion bodies.  White cells appear normal in morphology and maturation.  There are no hypersegmented polys.  There are no immature myeloid or lymphoid cells.  Platelets are adequate in number.  She has several large platelets.  Platelets are well granulated.  IMPRESSION:  Abigail Hall is a 31 year old [name] who is pregnant.  She is at 32 weeks.  Her baby boy is doing well from what she says.  He already weighs 6 pounds.  I believe that she clearly is iron deficient.  I do not believe that the anemia reflects, at all, her having thalassemia.  I am checking a hemoglobin electrophoresis on her to measure her hemoglobin A2 level.  I do not see any reason why we cannot give her iron.  I did go ahead and give her some samples of oral iron.  Hopefully she will be able to tolerate this without  constipation.  She is going to have a C-section.  She is not sure when the C-section is scheduled.  I want to hopefully get her hemoglobin above 10 prior to a C-section.  I want to try to avoid IV iron if I can prior to her delivering.  If she does need IV iron, we will have to have her set up in [facility] for this so that her fetus can be monitored.  I do not see any indication for a bone marrow biopsy.  Folic acid 5 mg a day is certainly a very aggressive way of trying to deal with any hemolysis from the thalassemia.  I think that 2 mg a  day of folic acid would be appropriate.  I did give Ms. Destin a prayer blanket.  She was very appreciative of this.  We had excellent fellowship.  Her son has a wonderful biblical name and I know that he will be blessed.  I do want to see Ms. Janco back in 1 month's time.  By then, she should be really close to having her C-section.  Hopefully by then, we will find that her hemoglobin is above 10.  I spent a good hour or more with Ms. Runco today.    ______________________________ [name], M.D. PRE/MEDQ  D:  12/03/2011  T:  12/03/2011  Job:  [id number]  ADDENDUM:  Ferritin is 42.  %Sat is 11. Hgb e'sis w/ 5% Hgb A2.  Therefore             (+)beta- Thalassemia minor

## 2011-12-03 NOTE — Progress Notes (Signed)
This office note has been dictated.

## 2011-12-07 ENCOUNTER — Other Ambulatory Visit: Payer: Self-pay

## 2011-12-07 LAB — RETICULOCYTES (CHCC)
ABS Retic: 192.7 10*3/uL — ABNORMAL HIGH (ref 19.0–186.0)
RBC.: 5.07 MIL/uL (ref 3.87–5.11)
Retic Ct Pct: 3.8 % — ABNORMAL HIGH (ref 0.4–2.3)

## 2011-12-07 LAB — HEMOGLOBINOPATHY EVALUATION
Hemoglobin Other: 0 %
Hgb A2 Quant: 4.9 % — ABNORMAL HIGH (ref 2.2–3.2)
Hgb A: 92.8 % — ABNORMAL LOW (ref 96.8–97.8)
Hgb F Quant: 2.3 % — ABNORMAL HIGH (ref 0.0–2.0)
Hgb S Quant: 0 %

## 2011-12-07 LAB — IRON AND TIBC
%SAT: 11 % — ABNORMAL LOW (ref 20–55)
Iron: 57 ug/dL (ref 42–145)
TIBC: 497 ug/dL — ABNORMAL HIGH (ref 250–470)
UIBC: 440 ug/dL — ABNORMAL HIGH (ref 125–400)

## 2011-12-07 LAB — FERRITIN: Ferritin: 43 ng/mL (ref 10–291)

## 2011-12-08 ENCOUNTER — Encounter: Payer: Managed Care, Other (non HMO) | Attending: Obstetrics and Gynecology

## 2011-12-08 DIAGNOSIS — Z713 Dietary counseling and surveillance: Secondary | ICD-10-CM | POA: Insufficient documentation

## 2011-12-08 DIAGNOSIS — O99810 Abnormal glucose complicating pregnancy: Secondary | ICD-10-CM | POA: Insufficient documentation

## 2011-12-10 ENCOUNTER — Encounter: Payer: Self-pay | Admitting: Dietician

## 2011-12-10 ENCOUNTER — Encounter: Payer: Managed Care, Other (non HMO) | Admitting: Dietician

## 2011-12-10 NOTE — Progress Notes (Signed)
  Medical Nutrition Therapy:  Appt start time: 0945 end time:  1015.  Assessment:  Primary concerns today: Needs insulin instructions.  Currently taking Glyburide 7.5 mg in the AM and in the PM.  C/O nausea through out the pregnancy.  Reports to me this morning that she has been on a Zofran pump to help control the nausea.  Relates difficulty "eating and keeping food down."  Has gained 21+ pounds with this pregnancy.  She is currently at 33 weeks and notes that the MD reports that her baby is weighing 6 lbs at this time.  She reports that the MD plans to deliver the baby early. Received permission to complete insulin instruction from Dr. Otho Perl at [facility].  Today, provided insulin education and instruction in mixing insulins using a syringe.  We pretended a dose of NPH (Cloudy) 10 units and Regular or Novolog or Humalog (Clear) for mixing purposes.  Ms Enfield used sterile saline and appropriately completed a return demonstration of mixing insulins and self-administration using the thigh. We briefly reviewed the diet, appropriate blood glucose levels, and hypoglycemia today.  She is to pick-up her insulin from the pharmacy and begin using it as ordered by Dr. Otho Perl.       Progress Towards Goal(s):  In progress.   Nutritional Diagnosis:  [place]-2.1 Inpaired nutrition utilization As related to glucose during pregnancy.  As evidenced by diagnosis of diabetes of pregnancy, use of glyburide, and need for insulin instruction..    Intervention:  Nutrition Brief review of the diet for the diabetes of pregnancy.  Brief review of the diet and weight loss needs post delivery.  Need to try to prevent the development of Type 2 diabetes later in life.  Handouts given during visit include:  BD Insulin Start Kit [id number]  EXP: 2016-07-12  Nutrition, Diabetes, and Pregnancy  Monitoring/Evaluation:  Dietary intake, exercise, blood glucose levels, and body weight  follow-up as needed.

## 2011-12-13 ENCOUNTER — Ambulatory Visit (INDEPENDENT_AMBULATORY_CARE_PROVIDER_SITE_OTHER): Payer: Medicaid Other | Admitting: Surgery

## 2011-12-13 ENCOUNTER — Encounter (INDEPENDENT_AMBULATORY_CARE_PROVIDER_SITE_OTHER): Payer: Self-pay | Admitting: Surgery

## 2011-12-13 VITALS — BP 136/88 | HR 108 | Temp 97.1°F | Resp 20 | Ht 64.0 in | Wt 291.2 lb

## 2011-12-13 DIAGNOSIS — K644 Residual hemorrhoidal skin tags: Secondary | ICD-10-CM

## 2011-12-13 DIAGNOSIS — D649 Anemia, unspecified: Secondary | ICD-10-CM

## 2011-12-13 DIAGNOSIS — K648 Other hemorrhoids: Secondary | ICD-10-CM

## 2011-12-13 HISTORY — DX: Anemia, unspecified: D64.9

## 2011-12-13 MED ORDER — HYDROCORTISONE ACE-PRAMOXINE 2.5-1 % RE CREA: TOPICAL_CREAM | Freq: Four times a day (QID) | RECTAL | Status: AC

## 2011-12-13 NOTE — Patient Instructions (Signed)

## 2011-12-13 NOTE — Progress Notes (Signed)
Subjective:     Patient ID: Abigail Hall, female   DOB: June 25, 1981, 31 y.o.   MRN: [id number]  HPI  Abigail Hall  23-Apr-1981 [id number]  Patient Care Team: Fortino Sic, MD as PCP - General (Obstetrics and Gynecology)  This patient is a 31 y.o.female who presents today for surgical evaluation at the request of Dr. Neva Seat & Christell Constant.   Reason for visit: Worsening hemorrhoids.  Patient is a pleasant morbidly obese female. She is now [redacted] weeks pregnant. She is straight with hemorrhoids for several decades. She notes that she will get bleeding within. She has anemia. She was found to be having worsening anemia with this pregnancy.  More recently, she hemorrhoids flare with worsening bleeding discomfort. She has pain when she moves her bowels and sitting on it. She's tried suppositories but that does not help. Using MiraLax and Senokot but still constipated. She recalls having a prior colonoscopy by Dr. Elnoria Howard. No polys. Hemorrhoids were the source of bleeding. She is rather frustrated.  She cannot tolerate the iron as it makes her constipation worse. She saw a hematologist (Dr. Myna Hidalgo) whom did not feel that thalassemia is an issue. Thanks it's iron deficiency anemia in the setting of pregnancy and bleeding hemorrhoids. Hammertoe, down but they do flareup She's never had any banding or injections or anal rectal surgery. No history of seizures. No drainage of pus. No severe dyschezia. Mainly just burning and discomfort afterwards.  Patient Active Problem List  Diagnoses  . Bleeding in early pregnancy  . Beta thalassemia minor  . Medication exposure during first trimester of pregnancy    Past Medical History  Diagnosis Date  . Seizures   . Anemia   . Thalassemia   . Epilepsy   . Depression   . Diabetes mellitus   . Hemorrhoids   . Rectal bleeding   . Constipation   . Nausea & vomiting   . Rectal pain   . Generalized headaches   . Weakness     Past Surgical History    Procedure Date  . Tongue surgery   . Laparoscopy   . Carpal tunnel release 01/2009  . Wisdom tooth extraction     History   Social History  . Marital Status: Single    Spouse Name: N/A    Number of Children: N/A  . Years of Education: N/A   Occupational History  . Not on file.   Social History Main Topics  . Smoking status: Never Smoker   . Smokeless tobacco: Never Used  . Alcohol Use: No  . Drug Use: No  . Sexually Active: Yes   Other Topics Concern  . Not on file   Social History Narrative  . No narrative on file    Family History  Problem Relation Age of Onset  . Anesthesia problems Neg Hx   . Hypotension Neg Hx   . Malignant hyperthermia Neg Hx   . Pseudochol deficiency Neg Hx   . Diabetes Mother   . Hyperlipidemia Father   . Diabetes Maternal Grandmother   . Cancer Maternal Grandfather   . Hyperlipidemia Paternal Grandfather     Current Outpatient Prescriptions  Medication Sig Dispense Refill  . acetaminophen (TYLENOL) 500 MG tablet Take 1,000 mg by mouth every 6 (six) hours as needed. For pain      . fluticasone (FLONASE) 50 MCG/ACT nasal spray Place 2 sprays into the nose daily.  16 g  1  . folic acid (FOLVITE) 1 MG tablet  Take 5 tablets (5 mg total) by mouth daily.  150 tablet  3  . glyBURIDE (DIABETA) 2.5 MG tablet Take 1 tablet (2.5 mg total) by mouth 2 (two) times daily with a meal.  60 tablet  3  . hydrocortisone (ANUSOL-HC) 25 MG suppository Place 25 mg rectally 2 (two) times daily as needed. Once at bedtime and once after bow movement if needed      . levETIRAcetam (KEPPRA) 500 MG tablet Take 2,000 mg by mouth every 12 (twelve) hours.       . magnesium hydroxide (MILK OF MAGNESIA) 400 MG/5ML suspension Take 15 mLs by mouth daily as needed. For constipation      . ondansetron (ZOFRAN-ODT) 4 MG disintegrating tablet Take 4 mg by mouth every 8 (eight) hours as needed. As needed for acid reflux      . pantoprazole (PROTONIX) 40 MG tablet Take 40 mg by  mouth daily.      . Prenatal Vit-Fe Fumarate-FA (PRENATAL MULTIVITAMIN) TABS Take 1 tablet by mouth daily.      . promethazine (PHENERGAN) 25 MG tablet Take 25 mg by mouth daily as needed. For nausea      . Pyridoxine HCl (VITAMIN B6 PO) Take 1 tablet by mouth daily.       Marland Kitchen zolpidem (AMBIEN) 5 MG tablet Take 2 tablets (10 mg total) by mouth at bedtime as needed. As needed for insomnia  30 tablet  0     Allergies  Allergen Reactions  . Latex Itching, Swelling and Rash    Where touched.    BP 136/88  Pulse 108  Temp(Src) 97.1 F (36.2 C) (Temporal)  Resp 20  Ht 5\' 4"  (1.626 m)  Wt 291 lb 4 oz (132.11 kg)  BMI 49.99 kg/m2  LMP 04/21/2011     Review of Systems  Constitutional: Negative for fever, chills, diaphoresis, appetite change and fatigue.  HENT: Negative for ear pain, sore throat, trouble swallowing, neck pain and ear discharge.   Eyes: Negative for photophobia, discharge and visual disturbance.  Respiratory: Negative for cough, choking, chest tightness and shortness of breath.   Cardiovascular: Negative for chest pain and palpitations.  Gastrointestinal: Negative for nausea, vomiting, abdominal pain, diarrhea, constipation, anal bleeding and rectal pain.  Genitourinary: Negative for dysuria, frequency and difficulty urinating.  Musculoskeletal: Negative for myalgias and gait problem.  Skin: Negative for color change, pallor and rash.  Neurological: Negative for dizziness, speech difficulty, weakness and numbness.  Hematological: Negative for adenopathy.  Psychiatric/Behavioral: Negative for confusion and agitation. The patient is not nervous/anxious.        Objective:   Physical Exam  Constitutional: She is oriented to person, place, and time. She appears well-developed and well-nourished. No distress.  HENT:  Head: Normocephalic.  Mouth/Throat: Oropharynx is clear and moist. No oropharyngeal exudate.  Eyes: Conjunctivae and EOM are normal. Pupils are equal,  round, and reactive to light. No scleral icterus.  Neck: Normal range of motion. Neck supple. No tracheal deviation present.  Cardiovascular: Normal rate, regular rhythm and intact distal pulses.   Pulmonary/Chest: Effort normal and breath sounds normal. No respiratory distress. She exhibits no tenderness.  Abdominal: Soft. She exhibits no distension and no mass. There is no tenderness. Hernia confirmed negative in the right inguinal area and confirmed negative in the left inguinal area.  Genitourinary: No vaginal discharge found.       Perianal skin clean with good hygiene.  No pruritis.  3 external skin tags / hemorrhoids of significance.  Sensitive but not infected.  Prolapsed narrow tag/hemorrhoid - reducible.   No pilonidal disease.  No fissure.  No abscess/fistula.    Tolerates digital rectal exam.  Normal sphincter tone.   No rectal masses.  Hemorrhoidal piles enlarged mildly by feel.  Cannot tolerate anoscopy   Musculoskeletal: Normal range of motion. She exhibits no tenderness.  Lymphadenopathy:    She has no cervical adenopathy.       Right: No inguinal adenopathy present.       Left: No inguinal adenopathy present.  Neurological: She is alert and oriented to person, place, and time. No cranial nerve deficit. She exhibits normal muscle tone. Coordination normal.  Skin: Skin is warm and dry. No rash noted. She is not diaphoretic. No erythema.  Psychiatric: She has a normal mood and affect. Her behavior is normal. Judgment and thought content normal.       Assessment:     Ext/Int hemorrhoids in obese female in 3rd Trimester    Plan:     The anatomy & physiology of the anorectal region was discussed.  The pathophysiology of hemorrhoids and differential diagnosis was discussed.  Natural history progression  was discussed.   I stressed the importance of a bowel regimen to have daily soft bowel movements to minimize progression of disease.   Goal of one BM / day ideal.  Use of wet  wipes, warm baths, avoiding straining, etc were emphasized.  Educational handouts further explaining the pathology, treatment options, and bowel regimen were given as well.   The patient expressed understanding.  Unfortunately, with her late pregnancy, our options are limited. These hemorrhoids are too sensitive and low for banding or injection. I do not think surgery is a good idea of this late in her pregnancy.  I recommend she maximize nonsurgical treatment. Warm soaks/towels/ice packs. Use Analpram p.r.n. Use warm soaks in the past.  I think her biggest issue is she needs to increase to MiraLax/fiber to help bulk up stools and make them soft. Her constipation is making this worse. Her anemia is a concern. Dr. Myna Hidalgo had considered IV iron but not quite yet.  I suspect that when she delivers her baby, she will require some type of surgical intervention for her hemorrhoids. Removal of the enlarged external hemorrhoids. Possible THD.  Probable removal of prolapsing hemorrhoid as well. However, I would like to wait until the pregnancy is over and she has daily soft problems before making a final recommendation on that.  She is understandably frustrated but understands that she does not want to put baby at risk for now.

## 2011-12-14 ENCOUNTER — Ambulatory Visit (HOSPITAL_COMMUNITY)
Admission: RE | Admit: 2011-12-14 | Discharge: 2011-12-14 | Disposition: A | Discharge status: Home / Self Care | Payer: Managed Care, Other (non HMO) | Source: Ambulatory Visit | Attending: Obstetrics and Gynecology | Admitting: Obstetrics and Gynecology

## 2011-12-14 ENCOUNTER — Encounter: Payer: Self-pay | Admitting: Dietician

## 2011-12-14 ENCOUNTER — Other Ambulatory Visit (HOSPITAL_COMMUNITY): Payer: Self-pay | Admitting: Maternal and Fetal Medicine

## 2011-12-14 DIAGNOSIS — E669 Obesity, unspecified: Secondary | ICD-10-CM | POA: Insufficient documentation

## 2011-12-14 DIAGNOSIS — O24419 Gestational diabetes mellitus in pregnancy, unspecified control: Secondary | ICD-10-CM

## 2011-12-14 DIAGNOSIS — O99350 Diseases of the nervous system complicating pregnancy, unspecified trimester: Secondary | ICD-10-CM | POA: Insufficient documentation

## 2011-12-14 DIAGNOSIS — G40909 Epilepsy, unspecified, not intractable, without status epilepticus: Secondary | ICD-10-CM | POA: Insufficient documentation

## 2011-12-15 ENCOUNTER — Other Ambulatory Visit: Payer: Self-pay

## 2011-12-20 ENCOUNTER — Other Ambulatory Visit: Payer: Self-pay | Admitting: Obstetrics and Gynecology

## 2011-12-20 DIAGNOSIS — O99891 Other specified diseases and conditions complicating pregnancy: Secondary | ICD-10-CM

## 2011-12-20 DIAGNOSIS — O9989 Other specified diseases and conditions complicating pregnancy, childbirth and the puerperium: Secondary | ICD-10-CM

## 2011-12-21 ENCOUNTER — Ambulatory Visit (HOSPITAL_COMMUNITY)
Admission: RE | Admit: 2011-12-21 | Discharge: 2011-12-21 | Disposition: A | Discharge status: Home / Self Care | Payer: Managed Care, Other (non HMO) | Source: Ambulatory Visit | Attending: Obstetrics and Gynecology | Admitting: Obstetrics and Gynecology

## 2011-12-21 DIAGNOSIS — O9989 Other specified diseases and conditions complicating pregnancy, childbirth and the puerperium: Secondary | ICD-10-CM

## 2011-12-21 DIAGNOSIS — O99810 Abnormal glucose complicating pregnancy: Secondary | ICD-10-CM | POA: Insufficient documentation

## 2011-12-21 DIAGNOSIS — O99891 Other specified diseases and conditions complicating pregnancy: Secondary | ICD-10-CM

## 2011-12-21 DIAGNOSIS — E669 Obesity, unspecified: Secondary | ICD-10-CM | POA: Insufficient documentation

## 2011-12-21 DIAGNOSIS — O99210 Obesity complicating pregnancy, unspecified trimester: Secondary | ICD-10-CM | POA: Insufficient documentation

## 2011-12-28 ENCOUNTER — Ambulatory Visit (HOSPITAL_COMMUNITY)
Admission: RE | Admit: 2011-12-28 | Discharge: 2011-12-28 | Disposition: A | Discharge status: Home / Self Care | Payer: Managed Care, Other (non HMO) | Source: Ambulatory Visit | Attending: Maternal and Fetal Medicine | Admitting: Maternal and Fetal Medicine

## 2011-12-28 ENCOUNTER — Other Ambulatory Visit: Payer: Self-pay | Admitting: Obstetrics and Gynecology

## 2011-12-28 ENCOUNTER — Ambulatory Visit (HOSPITAL_COMMUNITY)
Admission: RE | Admit: 2011-12-28 | Discharge: 2011-12-28 | Disposition: A | Discharge status: Home / Self Care | Payer: Managed Care, Other (non HMO) | Source: Ambulatory Visit | Attending: Obstetrics and Gynecology | Admitting: Obstetrics and Gynecology

## 2011-12-28 VITALS — BP 136/76 | HR 89 | Wt 297.0 lb

## 2011-12-28 DIAGNOSIS — Z3689 Encounter for other specified antenatal screening: Secondary | ICD-10-CM | POA: Insufficient documentation

## 2011-12-28 DIAGNOSIS — E669 Obesity, unspecified: Secondary | ICD-10-CM | POA: Insufficient documentation

## 2011-12-28 DIAGNOSIS — G40909 Epilepsy, unspecified, not intractable, without status epilepticus: Secondary | ICD-10-CM

## 2011-12-28 DIAGNOSIS — O99891 Other specified diseases and conditions complicating pregnancy: Secondary | ICD-10-CM

## 2011-12-28 DIAGNOSIS — O9989 Other specified diseases and conditions complicating pregnancy, childbirth and the puerperium: Secondary | ICD-10-CM

## 2011-12-28 DIAGNOSIS — O99810 Abnormal glucose complicating pregnancy: Secondary | ICD-10-CM | POA: Insufficient documentation

## 2011-12-28 DIAGNOSIS — O3660X0 Maternal care for excessive fetal growth, unspecified trimester, not applicable or unspecified: Secondary | ICD-10-CM

## 2011-12-31 ENCOUNTER — Encounter (HOSPITAL_COMMUNITY): Payer: Self-pay | Admitting: Anesthesiology

## 2011-12-31 ENCOUNTER — Inpatient Hospital Stay (HOSPITAL_COMMUNITY)
Admission: AD | Admit: 2011-12-31 | Discharge: 2012-01-05 | DRG: 765 | Disposition: A | Discharge status: Home / Self Care | Payer: Managed Care, Other (non HMO) | Source: Ambulatory Visit | Attending: Obstetrics & Gynecology | Admitting: Obstetrics & Gynecology

## 2011-12-31 ENCOUNTER — Other Ambulatory Visit (HOSPITAL_COMMUNITY): Payer: Self-pay | Admitting: Obstetrics & Gynecology

## 2011-12-31 ENCOUNTER — Other Ambulatory Visit: Payer: Self-pay

## 2011-12-31 ENCOUNTER — Encounter (HOSPITAL_COMMUNITY)
Admission: AD | Disposition: A | Discharge status: Home / Self Care | Payer: Self-pay | Source: Ambulatory Visit | Attending: Obstetrics & Gynecology

## 2011-12-31 ENCOUNTER — Encounter (HOSPITAL_COMMUNITY): Payer: Self-pay | Admitting: *Deleted

## 2011-12-31 ENCOUNTER — Other Ambulatory Visit (HOSPITAL_BASED_OUTPATIENT_CLINIC_OR_DEPARTMENT_OTHER): Payer: Managed Care, Other (non HMO) | Admitting: Lab

## 2011-12-31 ENCOUNTER — Ambulatory Visit (HOSPITAL_COMMUNITY)
Admission: RE | Admit: 2011-12-31 | Discharge: 2011-12-31 | Disposition: A | Discharge status: Home / Self Care | Payer: Managed Care, Other (non HMO) | Source: Ambulatory Visit | Attending: Obstetrics & Gynecology | Admitting: Obstetrics & Gynecology

## 2011-12-31 ENCOUNTER — Inpatient Hospital Stay (HOSPITAL_COMMUNITY): Payer: Managed Care, Other (non HMO) | Admitting: Anesthesiology

## 2011-12-31 ENCOUNTER — Ambulatory Visit (HOSPITAL_BASED_OUTPATIENT_CLINIC_OR_DEPARTMENT_OTHER): Payer: Managed Care, Other (non HMO) | Admitting: Hematology & Oncology

## 2011-12-31 VITALS — HR 84 | Temp 97.4°F | Ht 64.0 in | Wt 291.0 lb

## 2011-12-31 DIAGNOSIS — O358XX0 Maternal care for other (suspected) fetal abnormality and damage, not applicable or unspecified: Secondary | ICD-10-CM | POA: Diagnosis present

## 2011-12-31 DIAGNOSIS — O2690 Pregnancy related conditions, unspecified, unspecified trimester: Secondary | ICD-10-CM

## 2011-12-31 DIAGNOSIS — D259 Leiomyoma of uterus, unspecified: Secondary | ICD-10-CM | POA: Diagnosis present

## 2011-12-31 DIAGNOSIS — D509 Iron deficiency anemia, unspecified: Secondary | ICD-10-CM

## 2011-12-31 DIAGNOSIS — O9902 Anemia complicating childbirth: Secondary | ICD-10-CM | POA: Diagnosis present

## 2011-12-31 DIAGNOSIS — O2432 Unspecified pre-existing diabetes mellitus in childbirth: Secondary | ICD-10-CM | POA: Diagnosis present

## 2011-12-31 DIAGNOSIS — O99810 Abnormal glucose complicating pregnancy: Secondary | ICD-10-CM | POA: Insufficient documentation

## 2011-12-31 DIAGNOSIS — O34599 Maternal care for other abnormalities of gravid uterus, unspecified trimester: Secondary | ICD-10-CM | POA: Diagnosis present

## 2011-12-31 DIAGNOSIS — Z98891 History of uterine scar from previous surgery: Secondary | ICD-10-CM

## 2011-12-31 DIAGNOSIS — E669 Obesity, unspecified: Secondary | ICD-10-CM | POA: Diagnosis present

## 2011-12-31 DIAGNOSIS — O99210 Obesity complicating pregnancy, unspecified trimester: Secondary | ICD-10-CM | POA: Insufficient documentation

## 2011-12-31 DIAGNOSIS — O24419 Gestational diabetes mellitus in pregnancy, unspecified control: Secondary | ICD-10-CM

## 2011-12-31 DIAGNOSIS — E119 Type 2 diabetes mellitus without complications: Secondary | ICD-10-CM | POA: Diagnosis present

## 2011-12-31 DIAGNOSIS — O99350 Diseases of the nervous system complicating pregnancy, unspecified trimester: Secondary | ICD-10-CM | POA: Insufficient documentation

## 2011-12-31 DIAGNOSIS — D649 Anemia, unspecified: Secondary | ICD-10-CM | POA: Diagnosis present

## 2011-12-31 DIAGNOSIS — O99214 Obesity complicating childbirth: Secondary | ICD-10-CM | POA: Diagnosis present

## 2011-12-31 DIAGNOSIS — D4959 Neoplasm of unspecified behavior of other genitourinary organ: Secondary | ICD-10-CM | POA: Diagnosis present

## 2011-12-31 DIAGNOSIS — O99891 Other specified diseases and conditions complicating pregnancy: Secondary | ICD-10-CM

## 2011-12-31 DIAGNOSIS — G40909 Epilepsy, unspecified, not intractable, without status epilepticus: Secondary | ICD-10-CM | POA: Insufficient documentation

## 2011-12-31 LAB — GLUCOSE, CAPILLARY
Glucose-Capillary: 106 mg/dL — ABNORMAL HIGH (ref 70–99)
Glucose-Capillary: 104 mg/dL — ABNORMAL HIGH (ref 70–99)
Glucose-Capillary: 100 mg/dL — ABNORMAL HIGH (ref 70–99)

## 2011-12-31 LAB — CBC WITH DIFFERENTIAL (CANCER CENTER ONLY)
BASO#: 0 10*3/uL (ref 0.0–0.2)
BASO%: 0.2 % (ref 0.0–2.0)
EOS%: 0.6 % (ref 0.0–7.0)
Eosinophils Absolute: 0.1 10*3/uL (ref 0.0–0.5)
HCT: 29.5 % — ABNORMAL LOW (ref 34.8–46.6)
HGB: 9.4 g/dL — ABNORMAL LOW (ref 11.6–15.9)
LYMPH#: 2.2 10*3/uL (ref 0.9–3.3)
LYMPH%: 22.6 % (ref 14.0–48.0)
MCH: 18 pg — ABNORMAL LOW (ref 26.0–34.0)
MCHC: 31.9 g/dL — ABNORMAL LOW (ref 32.0–36.0)
MCV: 57 fL — ABNORMAL LOW (ref 81–101)
MONO#: 0.6 10*3/uL (ref 0.1–0.9)
MONO%: 6.3 % (ref 0.0–13.0)
NEUT#: 6.8 10*3/uL — ABNORMAL HIGH (ref 1.5–6.5)
NEUT%: 70.3 % (ref 39.6–80.0)
Platelets: 370 10*3/uL (ref 145–400)
RBC: 5.21 10*6/uL (ref 3.70–5.32)
RDW: 20 % — ABNORMAL HIGH (ref 11.1–15.7)
WBC: 9.7 10*3/uL (ref 3.9–10.0)

## 2011-12-31 LAB — IRON AND TIBC
%SAT: 21 % (ref 20–55)
Iron: 107 ug/dL (ref 42–145)
TIBC: 516 ug/dL — ABNORMAL HIGH (ref 250–470)
UIBC: 409 ug/dL — ABNORMAL HIGH (ref 125–400)

## 2011-12-31 LAB — CHCC SATELLITE - SMEAR

## 2011-12-31 LAB — CBC
HCT: 32.2 % — ABNORMAL LOW (ref 36.0–46.0)
Hemoglobin: 9.7 g/dL — ABNORMAL LOW (ref 12.0–15.0)
MCH: 17.5 pg — ABNORMAL LOW (ref 26.0–34.0)
MCHC: 30.1 g/dL (ref 30.0–36.0)
MCV: 58.2 fL — ABNORMAL LOW (ref 78.0–100.0)
Platelets: 337 10*3/uL (ref 150–400)
RBC: 5.53 MIL/uL — ABNORMAL HIGH (ref 3.87–5.11)
RDW: 19 % — ABNORMAL HIGH (ref 11.5–15.5)
WBC: 8.6 10*3/uL (ref 4.0–10.5)

## 2011-12-31 LAB — RPR: RPR Ser Ql: NONREACTIVE

## 2011-12-31 LAB — TYPE AND SCREEN
ABO/RH(D): O POS
Antibody Screen: NEGATIVE

## 2011-12-31 LAB — FERRITIN: Ferritin: 34 ng/mL (ref 10–291)

## 2011-12-31 SURGERY — Surgical Case
Anesthesia: Spinal

## 2011-12-31 MED ORDER — MEPERIDINE HCL 25 MG/ML IJ SOLN: 6.2500 mg | INTRAMUSCULAR | Status: DC | PRN

## 2011-12-31 MED ORDER — DIPHENHYDRAMINE HCL 50 MG/ML IJ SOLN
12.5000 mg | INTRAMUSCULAR | Status: DC | PRN
Start: 2011-12-31 — End: 2012-01-05

## 2011-12-31 MED ORDER — DIPHENHYDRAMINE HCL 50 MG/ML IJ SOLN: 25.0000 mg | INTRAMUSCULAR | Status: DC | PRN

## 2011-12-31 MED ORDER — PHENYLEPHRINE 40 MCG/ML (10ML) SYRINGE FOR IV PUSH (FOR BLOOD PRESSURE SUPPORT)
PREFILLED_SYRINGE | INTRAVENOUS | Status: AC
  Filled 2011-12-31: qty 5

## 2011-12-31 MED ORDER — BUPIVACAINE IN DEXTROSE 0.75-8.25 % IT SOLN
INTRATHECAL | Status: DC | PRN
  Administered 2011-12-31: 11.75 mg via INTRATHECAL

## 2011-12-31 MED ORDER — LACTATED RINGERS IV SOLN
INTRAVENOUS | Status: DC | PRN
  Administered 2011-12-31 (×3): via INTRAVENOUS

## 2011-12-31 MED ORDER — KETOROLAC TROMETHAMINE 60 MG/2ML IM SOLN
60.0000 mg | Freq: Once | INTRAMUSCULAR | Status: AC | PRN
  Administered 2011-12-31: 60 mg via INTRAMUSCULAR

## 2011-12-31 MED ORDER — WITCH HAZEL-GLYCERIN EX PADS
1.0000 "application " | MEDICATED_PAD | CUTANEOUS | Status: DC | PRN
  Administered 2012-01-04: 1 via TOPICAL

## 2011-12-31 MED ORDER — OXYTOCIN 10 UNIT/ML IJ SOLN
INTRAMUSCULAR | Status: AC
  Filled 2011-12-31: qty 4

## 2011-12-31 MED ORDER — PHENYLEPHRINE HCL 10 MG/ML IJ SOLN
INTRAMUSCULAR | Status: DC | PRN
  Administered 2011-12-31 (×2): 40 ug via INTRAVENOUS
  Administered 2011-12-31 (×3): 80 ug via INTRAVENOUS
  Administered 2011-12-31 (×2): 40 ug via INTRAVENOUS
  Administered 2011-12-31: 80 ug via INTRAVENOUS

## 2011-12-31 MED ORDER — MORPHINE SULFATE (PF) 0.5 MG/ML IJ SOLN
INTRAMUSCULAR | Status: DC | PRN
  Administered 2011-12-31: .1 mg via INTRATHECAL

## 2011-12-31 MED ORDER — METOCLOPRAMIDE HCL 5 MG/ML IJ SOLN
INTRAMUSCULAR | Status: AC
  Filled 2011-12-31: qty 2

## 2011-12-31 MED ORDER — OXYTOCIN 20 UNITS IN LACTATED RINGERS INFUSION - SIMPLE
125.0000 mL/h | INTRAVENOUS | Status: AC
  Filled 2011-12-31: qty 1000

## 2011-12-31 MED ORDER — MORPHINE SULFATE 0.5 MG/ML IJ SOLN
INTRAMUSCULAR | Status: AC
  Filled 2011-12-31: qty 10

## 2011-12-31 MED ORDER — FENTANYL CITRATE 0.05 MG/ML IJ SOLN: 25.0000 ug | INTRAMUSCULAR | Status: DC | PRN

## 2011-12-31 MED ORDER — KETOROLAC TROMETHAMINE 30 MG/ML IJ SOLN: 30.0000 mg | Freq: Four times a day (QID) | INTRAMUSCULAR | Status: AC | PRN

## 2011-12-31 MED ORDER — ONDANSETRON HCL 4 MG/2ML IJ SOLN
INTRAMUSCULAR | Status: AC
  Filled 2011-12-31: qty 2

## 2011-12-31 MED ORDER — OXYTOCIN BOLUS FROM INFUSION
500.0000 mL | Freq: Once | INTRAVENOUS | Status: DC
  Filled 2011-12-31: qty 500

## 2011-12-31 MED ORDER — SIMETHICONE 80 MG PO CHEW
80.0000 mg | CHEWABLE_TABLET | Freq: Three times a day (TID) | ORAL | Status: DC
  Administered 2012-01-01 – 2012-01-05 (×15): 80 mg via ORAL

## 2011-12-31 MED ORDER — OXYTOCIN 20 UNITS IN LACTATED RINGERS INFUSION - SIMPLE: 125.0000 mL/h | Freq: Once | INTRAVENOUS | Status: DC

## 2011-12-31 MED ORDER — CEFAZOLIN SODIUM 1-5 GM-% IV SOLN
INTRAVENOUS | Status: DC | PRN
  Administered 2011-12-31: 2 g via INTRAVENOUS

## 2011-12-31 MED ORDER — MENTHOL 3 MG MT LOZG: 1.0000 | LOZENGE | OROMUCOSAL | Status: DC | PRN

## 2011-12-31 MED ORDER — IBUPROFEN 600 MG PO TABS
600.0000 mg | ORAL_TABLET | Freq: Four times a day (QID) | ORAL | Status: DC | PRN
  Filled 2011-12-31 (×13): qty 1

## 2011-12-31 MED ORDER — ONDANSETRON HCL 4 MG/2ML IJ SOLN: 4.0000 mg | Freq: Four times a day (QID) | INTRAMUSCULAR | Status: DC | PRN

## 2011-12-31 MED ORDER — DIBUCAINE 1 % RE OINT: 1.0000 "application " | TOPICAL_OINTMENT | RECTAL | Status: DC | PRN

## 2011-12-31 MED ORDER — ONDANSETRON HCL 4 MG PO TABS: 4.0000 mg | ORAL_TABLET | ORAL | Status: DC | PRN

## 2011-12-31 MED ORDER — ONDANSETRON HCL 4 MG/2ML IJ SOLN: 4.0000 mg | Freq: Three times a day (TID) | INTRAMUSCULAR | Status: DC | PRN

## 2011-12-31 MED ORDER — LACTATED RINGERS IV SOLN: INTRAVENOUS | Status: DC

## 2011-12-31 MED ORDER — NALOXONE HCL 0.4 MG/ML IJ SOLN
0.4000 mg | INTRAMUSCULAR | Status: DC | PRN
Start: 2011-12-31 — End: 2012-01-05

## 2011-12-31 MED ORDER — ONDANSETRON HCL 4 MG/2ML IJ SOLN
INTRAMUSCULAR | Status: DC | PRN
  Administered 2011-12-31: 4 mg via INTRAVENOUS

## 2011-12-31 MED ORDER — LANOLIN HYDROUS EX OINT: 1.0000 "application " | TOPICAL_OINTMENT | CUTANEOUS | Status: DC | PRN

## 2011-12-31 MED ORDER — NALBUPHINE HCL 10 MG/ML IJ SOLN
5.0000 mg | INTRAMUSCULAR | Status: DC | PRN
  Filled 2011-12-31: qty 1

## 2011-12-31 MED ORDER — FENTANYL CITRATE 0.05 MG/ML IJ SOLN
INTRAMUSCULAR | Status: AC
  Filled 2011-12-31: qty 2

## 2011-12-31 MED ORDER — FLEET ENEMA 7-19 GM/118ML RE ENEM: 1.0000 | ENEMA | RECTAL | Status: DC | PRN

## 2011-12-31 MED ORDER — SIMETHICONE 80 MG PO CHEW: 80.0000 mg | CHEWABLE_TABLET | ORAL | Status: DC | PRN

## 2011-12-31 MED ORDER — LACTATED RINGERS IV SOLN
INTRAVENOUS | Status: DC
  Administered 2011-12-31: 22:00:00 via INTRAVENOUS

## 2011-12-31 MED ORDER — DIPHENHYDRAMINE HCL 25 MG PO CAPS
25.0000 mg | ORAL_CAPSULE | Freq: Four times a day (QID) | ORAL | Status: DC | PRN
  Filled 2011-12-31: qty 1

## 2011-12-31 MED ORDER — FAMOTIDINE IN NACL 20-0.9 MG/50ML-% IV SOLN
20.0000 mg | Freq: Once | INTRAVENOUS | Status: DC
  Administered 2011-12-31: 20 mg via INTRAVENOUS
  Filled 2011-12-31: qty 50

## 2011-12-31 MED ORDER — LACTATED RINGERS IV SOLN
500.0000 mL | INTRAVENOUS | Status: DC | PRN
  Administered 2011-12-31: 1000 mL via INTRAVENOUS

## 2011-12-31 MED ORDER — OXYCODONE-ACETAMINOPHEN 5-325 MG PO TABS
1.0000 | ORAL_TABLET | ORAL | Status: DC | PRN
  Administered 2012-01-01 – 2012-01-05 (×21): 2 via ORAL
  Filled 2011-12-31 (×21): qty 2

## 2011-12-31 MED ORDER — METOCLOPRAMIDE HCL 5 MG/ML IJ SOLN
10.0000 mg | Freq: Once | INTRAMUSCULAR | Status: AC
  Administered 2011-12-31: 10 mg via INTRAVENOUS

## 2011-12-31 MED ORDER — SCOPOLAMINE 1 MG/3DAYS TD PT72
1.0000 | MEDICATED_PATCH | Freq: Once | TRANSDERMAL | Status: DC
  Filled 2011-12-31: qty 1

## 2011-12-31 MED ORDER — LEVETIRACETAM ER 500 MG PO TB24
2000.0000 mg | ORAL_TABLET | Freq: Two times a day (BID) | ORAL | Status: DC
  Administered 2012-01-01: 2000 mg via ORAL
  Filled 2011-12-31 (×2): qty 4

## 2011-12-31 MED ORDER — KETOROLAC TROMETHAMINE 60 MG/2ML IM SOLN
INTRAMUSCULAR | Status: AC
  Administered 2011-12-31: 60 mg via INTRAMUSCULAR
  Filled 2011-12-31: qty 2

## 2011-12-31 MED ORDER — OXYCODONE-ACETAMINOPHEN 5-325 MG PO TABS: 1.0000 | ORAL_TABLET | ORAL | Status: DC | PRN

## 2011-12-31 MED ORDER — DIPHENHYDRAMINE HCL 25 MG PO CAPS
25.0000 mg | ORAL_CAPSULE | ORAL | Status: DC | PRN
  Administered 2011-12-31 – 2012-01-01 (×2): 25 mg via ORAL
  Filled 2011-12-31: qty 1

## 2011-12-31 MED ORDER — CITRIC ACID-SODIUM CITRATE 334-500 MG/5ML PO SOLN: 30.0000 mL | ORAL | Status: DC | PRN

## 2011-12-31 MED ORDER — OXYTOCIN 20 UNITS IN LACTATED RINGERS INFUSION - SIMPLE
INTRAVENOUS | Status: DC | PRN
  Administered 2011-12-31 (×2): 20 [IU] via INTRAVENOUS

## 2011-12-31 MED ORDER — SODIUM CHLORIDE 0.9 % IV SOLN
1.0000 ug/kg/h | INTRAVENOUS | Status: DC | PRN
  Filled 2011-12-31: qty 2.5

## 2011-12-31 MED ORDER — PRENATAL MULTIVITAMIN CH
1.0000 | ORAL_TABLET | Freq: Every day | ORAL | Status: DC
  Administered 2012-01-01 – 2012-01-05 (×6): 1 via ORAL
  Filled 2011-12-31 (×6): qty 1

## 2011-12-31 MED ORDER — TETANUS-DIPHTH-ACELL PERTUSSIS 5-2.5-18.5 LF-MCG/0.5 IM SUSP
0.5000 mL | Freq: Once | INTRAMUSCULAR | Status: AC
  Administered 2012-01-04: 0.5 mL via INTRAMUSCULAR
  Filled 2011-12-31: qty 0.5

## 2011-12-31 MED ORDER — ACETAMINOPHEN 325 MG PO TABS: 650.0000 mg | ORAL_TABLET | ORAL | Status: DC | PRN

## 2011-12-31 MED ORDER — SODIUM CHLORIDE 0.9 % IJ SOLN: 3.0000 mL | INTRAMUSCULAR | Status: DC | PRN

## 2011-12-31 MED ORDER — EPHEDRINE 5 MG/ML INJ
INTRAVENOUS | Status: AC
  Filled 2011-12-31: qty 10

## 2011-12-31 MED ORDER — LIDOCAINE HCL (PF) 1 % IJ SOLN: 30.0000 mL | INTRAMUSCULAR | Status: DC | PRN

## 2011-12-31 MED ORDER — ONDANSETRON HCL 4 MG/2ML IJ SOLN: 4.0000 mg | INTRAMUSCULAR | Status: DC | PRN

## 2011-12-31 MED ORDER — SENNOSIDES-DOCUSATE SODIUM 8.6-50 MG PO TABS
2.0000 | ORAL_TABLET | Freq: Every day | ORAL | Status: DC
  Administered 2012-01-01 – 2012-01-04 (×5): 2 via ORAL

## 2011-12-31 MED ORDER — FENTANYL CITRATE 0.05 MG/ML IJ SOLN
INTRAMUSCULAR | Status: DC | PRN
  Administered 2011-12-31: 15 ug via INTRATHECAL

## 2011-12-31 MED ORDER — ZOLPIDEM TARTRATE 5 MG PO TABS
5.0000 mg | ORAL_TABLET | Freq: Every evening | ORAL | Status: DC | PRN
  Administered 2012-01-02 (×2): 5 mg via ORAL
  Filled 2011-12-31 (×4): qty 1

## 2011-12-31 MED ORDER — IBUPROFEN 600 MG PO TABS
600.0000 mg | ORAL_TABLET | Freq: Four times a day (QID) | ORAL | Status: DC
  Administered 2012-01-01 – 2012-01-05 (×16): 600 mg via ORAL
  Filled 2011-12-31 (×6): qty 1

## 2011-12-31 MED ORDER — IBUPROFEN 600 MG PO TABS: 600.0000 mg | ORAL_TABLET | Freq: Four times a day (QID) | ORAL | Status: DC | PRN

## 2011-12-31 MED ORDER — METOCLOPRAMIDE HCL 5 MG/ML IJ SOLN: 10.0000 mg | Freq: Three times a day (TID) | INTRAMUSCULAR | Status: DC | PRN

## 2011-12-31 MED ORDER — CITRIC ACID-SODIUM CITRATE 334-500 MG/5ML PO SOLN
ORAL | Status: AC
  Administered 2011-12-31: 30 mL
  Filled 2011-12-31: qty 15

## 2011-12-31 SURGICAL SUPPLY — 34 items
CLOTH BEACON ORANGE TIMEOUT ST (SAFETY) ×2 IMPLANT
DRESSING TELFA 8X3 (GAUZE/BANDAGES/DRESSINGS) ×1 IMPLANT
DRSG COVADERM 4X10 (GAUZE/BANDAGES/DRESSINGS) ×1 IMPLANT
ELECT REM PT RETURN 9FT ADLT (ELECTROSURGICAL) ×2
ELECTRODE REM PT RTRN 9FT ADLT (ELECTROSURGICAL) ×1 IMPLANT
EXTRACTOR VACUUM M CUP 4 TUBE (SUCTIONS) ×1 IMPLANT
GLOVE BIO SURGEON STRL SZ 6.5 (GLOVE) IMPLANT
GLOVE BIO SURGEON STRL SZ7.5 (GLOVE) ×1 IMPLANT
GLOVE BIOGEL PI IND STRL 7.0 (GLOVE) IMPLANT
GLOVE BIOGEL PI IND STRL 8 (GLOVE) ×1 IMPLANT
GLOVE BIOGEL PI INDICATOR 7.0 (GLOVE) ×2
GLOVE BIOGEL PI INDICATOR 8 (GLOVE) ×1
GOWN PREVENTION PLUS LG XLONG (DISPOSABLE) ×2 IMPLANT
GOWN PREVENTION PLUS XLARGE (GOWN DISPOSABLE) IMPLANT
GOWN STRL REIN XL XLG (GOWN DISPOSABLE) ×2 IMPLANT
KIT ABG SYR 3ML LUER SLIP (SYRINGE) ×2 IMPLANT
NDL HYPO 25X5/8 SAFETYGLIDE (NEEDLE) IMPLANT
NEEDLE HYPO 25X5/8 SAFETYGLIDE (NEEDLE) ×2 IMPLANT
NS IRRIG 1000ML POUR BTL (IV SOLUTION) ×3 IMPLANT
PACK C SECTION WH (CUSTOM PROCEDURE TRAY) ×2 IMPLANT
PAD ABD 7.5X8 STRL (GAUZE/BANDAGES/DRESSINGS) ×1 IMPLANT
SLEEVE SCD COMPRESS KNEE MED (MISCELLANEOUS) ×1 IMPLANT
SPONGE GAUZE 4X4 12PLY (GAUZE/BANDAGES/DRESSINGS) ×1 IMPLANT
SPONGE LAP 18X18 X RAY DECT (DISPOSABLE) ×1 IMPLANT
STAPLER VISISTAT 35W (STAPLE) ×2 IMPLANT
SUT PLAIN 2 0 (SUTURE)
SUT PLAIN 2 0 XLH (SUTURE) ×2 IMPLANT
SUT PLAIN ABS 2-0 54XMFL TIE (SUTURE) IMPLANT
SUT VIC AB 0 CT1 36 (SUTURE) ×8 IMPLANT
SUT VICRYL 4-0 PS2 18IN ABS (SUTURE) IMPLANT
TAPE CLOTH SURG 4X10 WHT LF (GAUZE/BANDAGES/DRESSINGS) ×1 IMPLANT
TOWEL OR 17X24 6PK STRL BLUE (TOWEL DISPOSABLE) ×4 IMPLANT
TRAY FOLEY CATH 14FR (SET/KITS/TRAYS/PACK) ×1 IMPLANT
WATER STERILE IRR 1000ML POUR (IV SOLUTION) ×1 IMPLANT

## 2011-12-31 NOTE — Anesthesia Procedure Notes (Signed)
Spinal  Patient location during procedure: OR Start time: 12/31/2011 3:10 PM Staffing Performed by: anesthesiologist  Preanesthetic Checklist Completed: patient identified, site marked, surgical consent, pre-op evaluation, timeout performed, IV checked, risks and benefits discussed and monitors and equipment checked Spinal Block Patient position: sitting Prep: site prepped and draped and DuraPrep Patient monitoring: heart rate, cardiac monitor, continuous pulse ox and blood pressure Approach: midline Location: L3-4 Injection technique: single-shot Needle Needle type: Tuohy and Pencan  Needle gauge: 25 G Needle length: 9 cm Assessment Sensory level: T4 Additional Notes Unable to obtain CSF with 24 ga Sprotte or 24 ga Pencan (unable to get deep enough) on initial attempts.  Switched to 17 ga tuohy with LOR to air at 9+ cm, passed 25 ga Pencan and immediately obtained clear free flow CSF.  No paresthesia.  Patient tolerated entire procedure well.  Jasmine December, MD

## 2011-12-31 NOTE — Progress Notes (Signed)
This office note has been dictated.

## 2011-12-31 NOTE — Transfer of Care (Signed)
Immediate Anesthesia Transfer of Care Note  Patient: Abigail Hall  Procedure(s) Performed: Procedure(s) (LRB): CESAREAN SECTION (N/A)  Patient Location: PACU  Anesthesia Type: Spinal  Level of Consciousness: awake, alert  and oriented  Airway & Oxygen Therapy: Patient Spontanous Breathing  Post-op Assessment: Report given to PACU RN and Post -op Vital signs reviewed and stable  Post vital signs: Reviewed and stable  Complications: No apparent anesthesia complications

## 2011-12-31 NOTE — Progress Notes (Signed)
CC:   Arlyce Harman, MD Genene Churn. Love, M.D.  DIAGNOSES: 1. Iron-deficiency anemia. 2. Third trimester pregnancy, patient to likely be induced today. 3. Thalassemia.  CURRENT THERAPY:  Oral iron.  INTERIM HISTORY:  Ms. Ebrahimi comes in for followup.  She is going to be induced today.  She saw Dr. Neva Seat, who is her obstetrician.  Apparently the baby is not moving that well.  He has good heart rate, however.  He probably weighs close to 10 pounds, if not more.  Ms. Budreau says she is going to go to the hospital and likely be induced.  When we first saw her back in April, she clearly was iron-deficient. Her ferritin was 43 with an iron saturation of 11%.  We got her on oral iron.  She did okay with this initially.  She then began to have "heartburn."  Some of this may have been from the baby and her not having a lot of room to digest.  She feels well.  She is having no problems with any unusual fatigue. There has been no leg swelling.  She has had no rashes.  There has been no cough.  PHYSICAL EXAMINATION:  This is a markedly pregnant black female in no obvious distress.  Vital signs:  97.4, pulse 68, respiratory rate 20, blood pressure 126/80.  Weight is 291.  Head and neck:  A normocephalic, atraumatic skull.  There are no ocular or oral lesions.  There are no palpable cervical or supraclavicular lymph nodes.  Lungs:  Clear bilaterally.  Cardiac:  Regular rate and rhythm with a normal S1 and S2. There are no murmurs, rubs or bruits.  Abdomen:  Soft with good bowel sounds.  She is markedly pregnant.  She has no obvious fluid wave.  I really cannot palpate her liver or spleen.  Extremities:  No clubbing, cyanosis or edema.  Skin:  No rashes, ecchymosis or petechia.  LABORATORY STUDIES:  White cell count is 9.7, hemoglobin 9.4, hematocrit 29.5, platelet count 370.  MCV is 57.  RDW is 20.  Peripheral smear does show continued hypochromic, microcytic red cells. There is  some anisocytosis.  There is some poikilocytosis.  She has a of couple target cells.  There are no nucleated red blood cells.  I see no schistocytes.  She has no rouleaux formation.  White cells appear normal in morphology and maturation.  There are no immature myeloid or lymphoid cells.  Platelets are adequate in number and size.  IMPRESSION:  Ms. Montejano is a 31 year old [name] who is literally going to have her child today.  She is on oral iron.  She was not taking it every day.  However, her hemoglobin has improved.  I really do not see any problems with her undergoing induction.  If she needs a C-section, she should be okay.  I suspect that she will definitely need IV iron afterwards.  She still has a significant iron deficiency.  However, the iron that she did take was adequate enough to help with and erythrogenesis.  I am just happy that she is going to have her child.  This will definitely be a blessed event for her.  She will try to get her prayer blanket from home so she can take that with her to the hospital.  I told Ms. Hirsch that if they need our assistance while she is in the hospital with her anemia, we will be more happy to help her out.  We will probably plan  to get her back to see [place] in another 6 weeks.    ______________________________ [name], M.D. PRE/MEDQ  D:  12/31/2011  T:  12/31/2011  Job:  [id number]

## 2011-12-31 NOTE — Anesthesia Postprocedure Evaluation (Signed)
Anesthesia Post Note  Patient: Abigail Hall  Procedure(s) Performed: Procedure(s) (LRB): CESAREAN SECTION (N/A)  Anesthesia type: Spinal  Patient location: PACU  Post pain: Pain level controlled  Post assessment: Post-op Vital signs reviewed  Last Vitals:  Filed Vitals:   12/31/11 1730  BP: 115/69  Pulse:   Temp: 36.3 C  Resp: 18    Post vital signs: Reviewed  Level of consciousness: awake  Complications: No apparent anesthesia complications

## 2011-12-31 NOTE — Op Note (Addendum)
Cesarean Section Procedure Note  Indications: non-reassuring fetal status This 31 year old gravida 3 para 0 AB 2 presents to the office for an NST.  NST was nonreactive and the patient was asked regarding fetal movement and she stated the baby had decreased fetal movement.  Dr. Sherrie George was called and the patient was sent for biophysical profile.  The patient had a biophysical profile 2/10.  Dr. Sherrie George called me to inform me that she recommended delivery for nonreassuring fetal status and dilated cardiomyopathy which had been discovered on today's ultrasound.  The patient was sent to the hospital for delivery.   Pre-operative Diagnosis:  36 week 2 day pregnancy. Fetal dilated cardiomyopathy Nonreassuring fetus surveillance/status (BPP  2/10) Poorly controlled class A2 diabetes mellitus Obesity History of seizure disorder Thalassemia minor Anemia Fibroid uterus Post-operative Diagnosis: same plus delivered  Surgeon: Delbert Harness.   Assistants: Dr. Macon Large  Anesthesia: Spinal anesthesia  ASA Class: none   Procedure Details   The patient was seen in the Labor and Delivery. The risks, benefits, complications, treatment options, and expected outcomes were discussed with the patient.  The patient concurred with the proposed plan, giving informed consent. The patient was taken to Operating Room # 9, identified as Abigail Hall and the procedure verified as C-Section Delivery. A Time Out was held and the above information confirmed.  Antibiotic administration of 2 g of Ancef was administered.  This was confirmed.  No specific vaginal instrumentation was needed.  Pediatrician was notified.  After induction of anesthesia, the patient was draped and prepped in the usual sterile manner. A Pfannenstiel incision was made and carried down through the subcutaneous tissue to the fascia. Fascial incision was made and extended transversely. The fascia was separated from the underlying rectus  tissue superiorly and inferiorly. The peritoneum was identified and entered. Peritoneal incision was extended with blood pressure.  2 lap sponges were placed in the paracolic gutters bilaterally. The utero-vesical peritoneal reflection was incised transversely and the bladder flap was bluntly freed from the lower uterine segment. A low transverse uterine incision was made. Delivered from cephalic presentation was a ??? gram Female with Apgar scores of ?/? at one minute and  at five minutes. After the umbilical cord was clamped and cut cord blood gases and cord blood was obtained for evaluation. The placenta was removed intact and appeared normal. The uterine outline, tubes and ovaries appeared normal. The uterine incision was closed with running locked sutures of 0 vicryl in two layers and Hemostasis was observed. Lavage was carried out until clear of the upper abdomen. The fascia was then reapproximated with running sutures of 0 Vicryl.  Subcutaneous tissue was irrigated and cleared of all clot and debris and made hemostatic with electrocautery The skin was reapproximated with 3.0 Monocryl.  Instrument, sponge, and needle counts were correct prior the abdominal closure and at the conclusion of the case.   Findings: Normal well developed well uterine segment.  A exophytic serosal fibroid was noted of the right lower uterine segment both ovaries and tubes were within normal limits.  The placenta was enlarged.  Uterine cavity was within normal limits.  The uterus and was reapproximated in 2 layers without difficulty.  The bladder flap was well away from this area.  The good hemostasis was noted.  Fascia is reapproximated as above the subcutaneous tissue was less than 2 inches thick. A viable female infant was moving moderately well at the time of delivery.  There was some difficulty in delivery  of the infant however this was all done with by slow and steady pressure as progress continued throughout the delivery but  slow.  The baby was handed off to the waiting pediatrician Dr. Eric Form.  The baby was taken to the neonatal intensive care. Cord blood gasses pH was 6.83 and 6.81 of the umbilical artery and umbilical venous vessel.  Estimated Blood Loss:  1000 ml         Drains: foley UO 100 ml         Total IV Fluids: 3500 ml         Specimens: Placenta and Disposition:  Sent to Pathology          Implants: none         Complications:  None; patient tolerated the procedure well.         Disposition: PACU - hemodynamically stable.         Condition: stable  Attending Attestation: I was present and scrubbed for the entire procedure.

## 2011-12-31 NOTE — Anesthesia Preprocedure Evaluation (Signed)
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),  negative psych ROS   GI/Hepatic Neg liver ROS, GERD-  Medicated,  Endo/Other  Diabetes mellitus-, Gestational, Insulin DependentMorbid obesity  Renal/GU negative Renal ROS  negative genitourinary   Musculoskeletal   Abdominal   Peds  Hematology  (+) Blood dyscrasia (beta thal minor), ,   Anesthesia Other Findings   Reproductive/Obstetrics (+) Pregnancy (NRFS, BPP 2/10)                           Anesthesia Physical Anesthesia Plan  ASA: III and Emergent  Anesthesia Plan: Spinal   Post-op Pain Management:    Induction:   Airway Management Planned:   Additional Equipment:   Intra-op Plan:   Post-operative Plan:   Informed Consent: I have reviewed the patients History and Physical, chart, labs and discussed the procedure including the risks, benefits and alternatives for the proposed anesthesia with the patient or authorized representative who has indicated his/her understanding and acceptance.   Dental Advisory Given  Plan Discussed with: Surgeon and CRNA  Anesthesia Plan Comments:         Anesthesia Quick Evaluation

## 2011-12-31 NOTE — H&P (Signed)
Abigail Hall is a 31 y.o. female presenting for urgent CSx because of non reassuring fetal status with BPP 2/10 and development of dilated cardiomyopathy in the fetus. Maternal Medical History:  Fetal activity: Perceived fetal activity is none and recently changed.      OB History    Grav Para Term Preterm Abortions TAB SAB Ect Mult Living   3 0 0 0 2 1 1 0 0 0      Past Medical History  Diagnosis Date  . Seizures   . Anemia   . Thalassemia   . Epilepsy   . Depression   . Diabetes mellitus   . Hemorrhoids   . Rectal bleeding   . Constipation   . Nausea & vomiting   . Rectal pain   . Generalized headaches   . Weakness    Past Surgical History  Procedure Date  . Tongue surgery   . Laparoscopy   . Carpal tunnel release 01/2009  . Wisdom tooth extraction    Family History: family history includes Cancer in her maternal grandfather; Diabetes in her maternal grandmother and mother; and Hyperlipidemia in her father and paternal grandfather.  There is no history of Anesthesia problems, and Hypotension, and Malignant hyperthermia, and Pseudochol deficiency, . Social History:  reports that she has never smoked. She has never used smokeless tobacco. She reports that she does not drink alcohol or use illicit drugs.  Review of Systems  Constitutional: Negative.   HENT: Negative.   Eyes: Negative.   Respiratory: Negative.   Cardiovascular: Negative.   Gastrointestinal: Negative.   Genitourinary: Negative.   Musculoskeletal: Negative.   Skin: Negative.   Neurological: Negative.   Endo/Heme/Allergies: Negative.   Psychiatric/Behavioral: Negative.    No HA,nausea , vomiting ,chest pain or palpitations, jotr bleeding or fever nor cough no bleeding and no abnormal discharge   Last menstrual period 04/21/2011. Maternal Exam:  Abdomen: Patient reports no abdominal tenderness. Estimated fetal weight is 9#.    Introitus: Normal vulva. Normal vagina.  Ferning test: not done.    Nitrazine test: not done. Amniotic fluid character: not assessed.     Physical Exam  Constitutional: She appears well-developed and well-nourished.  HENT:  Head: Normocephalic and atraumatic.  Right Ear: External ear normal.  Left Ear: External ear normal.  Eyes: Conjunctivae and EOM are normal. Pupils are equal, round, and reactive to light.  Neck: Normal range of motion. Neck supple.  Cardiovascular: Normal rate.   Respiratory: Effort normal and breath sounds normal.  GI: Soft. Normal appearance.       gravid   GU defrerred Prenatal labs: ABO, Rh: O/Positive/-- (10/30 0000) Antibody: Negative (10/30 0000) Rubella: Immune (10/30 0000) RPR:    HBsAg: Negative (10/30 0000)  HIV: Non-reactive (10/30 0000)  GBS:     Assessment/Plan: IUP at 36w2 Non reassuring fetal status  Will jproceed with CSx now and peds aware Cache Bills H. 12/31/2011, 2:48 PM

## 2012-01-01 LAB — GLUCOSE, CAPILLARY
Glucose-Capillary: 164 mg/dL — ABNORMAL HIGH (ref 70–99)
Glucose-Capillary: 179 mg/dL — ABNORMAL HIGH (ref 70–99)

## 2012-01-01 LAB — CBC
HCT: 26.2 % — ABNORMAL LOW (ref 36.0–46.0)
Hemoglobin: 8 g/dL — ABNORMAL LOW (ref 12.0–15.0)
MCH: 17.9 pg — ABNORMAL LOW (ref 26.0–34.0)
MCHC: 30.5 g/dL (ref 30.0–36.0)
MCV: 58.6 fL — ABNORMAL LOW (ref 78.0–100.0)
Platelets: 280 10*3/uL (ref 150–400)
RBC: 4.47 MIL/uL (ref 3.87–5.11)
RDW: 18.8 % — ABNORMAL HIGH (ref 11.5–15.5)
WBC: 10.6 10*3/uL — ABNORMAL HIGH (ref 4.0–10.5)

## 2012-01-01 LAB — HEMOGLOBIN A1C
Hgb A1c MFr Bld: 6.8 % — ABNORMAL HIGH (ref <5.7)
Mean Plasma Glucose: 148 mg/dL — ABNORMAL HIGH (ref <117)

## 2012-01-01 MED ORDER — LEVETIRACETAM 750 MG PO TABS
2000.0000 mg | ORAL_TABLET | Freq: Two times a day (BID) | ORAL | Status: DC
  Administered 2012-01-01 – 2012-01-05 (×9): 2000 mg via ORAL
  Filled 2012-01-01 (×11): qty 1

## 2012-01-01 MED ORDER — BISACODYL 10 MG RE SUPP
10.0000 mg | Freq: Once | RECTAL | Status: AC
  Administered 2012-01-01: 10 mg via RECTAL
  Filled 2012-01-01: qty 1

## 2012-01-01 NOTE — Addendum Note (Signed)
Addendum  created 01/01/12 0102 by Earmon Phoenix, CRNA   Modules edited:Notes Section

## 2012-01-01 NOTE — Anesthesia Postprocedure Evaluation (Deleted)
  Anesthesia Post-op Note  Patient: Abigail Hall  Procedure(s) Performed: Procedure(s) (LRB): CESAREAN SECTION (N/A)  Patient Location: PACU  Anesthesia Type: Spinal  Level of Consciousness: awake, alert  and oriented  Airway and Oxygen Therapy: Patient Spontanous Breathing  Post-op Pain: mild  Post-op Assessment: Patient's Cardiovascular Status Stable and Respiratory Function Stable  Post-op Vital Signs: stable  Complications: No apparent anesthesia complications

## 2012-01-01 NOTE — Addendum Note (Signed)
Addendum  created 01/01/12 2694 by Earmon Phoenix, CRNA   Modules edited:Notes Section

## 2012-01-01 NOTE — Anesthesia Postprocedure Evaluation (Signed)
  Anesthesia Post-op Note  Patient: Abigail Hall  Procedure(s) Performed: Procedure(s) (LRB): CESAREAN SECTION (N/A)  Patient Location: Mother/Baby  Anesthesia Type: Spinal  Level of Consciousness: awake, alert  and oriented  Airway and Oxygen Therapy: Patient Spontanous Breathing  Post-op Pain: mild  Post-op Assessment: Patient's Cardiovascular Status Stable and Respiratory Function Stable  Post-op Vital Signs: stable  Complications: No apparent anesthesia complications

## 2012-01-01 NOTE — Progress Notes (Signed)
Subjective: Postpartum Day 1: Cesarean Delivery Patient reports tolerating PO and no problems voiding.    Objective: Vital signs in last 24 hours: Temp:  [97.4 F (36.3 C)-98.4 F (36.9 C)] 97.9 F (36.6 C) (05/18 1015) Pulse Rate:  [72-110] 94  (05/18 1015) Resp:  [13-22] 22  (05/18 1015) BP: (98-129)/(44-82) 113/75 mmHg (05/18 1015) SpO2:  [99 %-100 %] 100 % (05/18 1015) Weight:  [291 lb (131.997 kg)] 291 lb (131.997 kg) (05/18 0500)  Physical Exam:  General: alert, cooperative and no distress Lochia: appropriate Uterine Fundus: firm Incision: no significant drainage DVT Evaluation: No evidence of DVT seen on physical exam.   Basename 01/01/12 0509 12/31/11 1433  HGB 8.0* 9.7*  HCT 26.2* 32.2*    Assessment/Plan: Status post Cesarean section. Doing well postoperatively.  Continue current care.  [name] E 01/01/2012, 10:57 AM

## 2012-01-02 LAB — GLUCOSE, CAPILLARY: Glucose-Capillary: 156 mg/dL — ABNORMAL HIGH (ref 70–99)

## 2012-01-02 MED ORDER — BISACODYL 10 MG RE SUPP
10.0000 mg | Freq: Once | RECTAL | Status: AC
  Administered 2012-01-03: 10 mg via RECTAL
  Filled 2012-01-02: qty 1

## 2012-01-02 MED ORDER — POLYETHYLENE GLYCOL 3350 17 G PO PACK
17.0000 g | PACK | Freq: Every day | ORAL | Status: DC | PRN
  Administered 2012-01-02 – 2012-01-04 (×3): 17 g via ORAL
  Filled 2012-01-02 (×2): qty 1

## 2012-01-02 NOTE — Progress Notes (Signed)
Subjective: No complaints, ambulating well Postpartum Day 2: Cesarean Delivery Patient reports tolerating PO and no problems voiding.   No flatus or BM yet.  Objective: Vital signs in last 24 hours: Temp:  [97.9 F (36.6 C)-98.4 F (36.9 C)] 98 F (36.7 C) (05/19 0529) Pulse Rate:  [72-121] 97  (05/19 0529) Resp:  [18-22] 20  (05/19 0529) BP: (98-146)/(61-85) 108/69 mmHg (05/19 0529) SpO2:  [99 %-100 %] 100 % (05/19 0529) Glucose 165 this am, not true fasting. On regular diet.  Physical Exam:  General: alert, cooperative and no distress Lochia: appropriate Uterine Fundus: firm Incision: healing well, no significant drainage DVT Evaluation: No evidence of DVT seen on physical exam.   Basename 01/01/12 0509 12/31/11 1433  HGB 8.0* 9.7*  HCT 26.2* 32.2*    Assessment/Plan: Status post Cesarean section. Doing well postoperatively.  Continue current care. Change to low carb diabetic diet Repeat dulcolax  Miralax  Yola Paradiso E 01/02/2012, 6:18 AM

## 2012-01-03 ENCOUNTER — Encounter (HOSPITAL_COMMUNITY): Payer: Self-pay | Admitting: Obstetrics & Gynecology

## 2012-01-03 LAB — GLUCOSE, CAPILLARY: Glucose-Capillary: 153 mg/dL — ABNORMAL HIGH (ref 70–99)

## 2012-01-03 MED ORDER — MINERAL OIL RE ENEM: 1.0000 | ENEMA | Freq: Once | RECTAL | Status: DC

## 2012-01-03 MED ORDER — FLEET ENEMA 7-19 GM/118ML RE ENEM
1.0000 | ENEMA | Freq: Once | RECTAL | Status: AC
  Administered 2012-01-03: 1 via RECTAL

## 2012-01-03 NOTE — Plan of Care (Signed)
Problem: Discharge Progression Outcomes Goal: Barriers To Progression Addressed/Resolved Outcome: Progressing Has passed a very small amt of flatus,but has a very good appetite and good bowel sounds.

## 2012-01-03 NOTE — Progress Notes (Signed)
01/03/12 1600  Clinical Encounter Type  Visited With Patient and family together ([name] Abigail Hall)  Visit Type Initial;Spiritual support;Social support  Spiritual Encounters  Spiritual Needs Emotional    Visited with Ms Abigail Hall at baby Abigail Hall's bedside in NICU.  She was both beaming at Abigail Hall's progress and tearful due to "hormones" and the feeling that "I don't want to miss a thing," the natural stress of being separated from baby due to NICU stay.  [name] reports good family support.  Provided pastoral presence and listening, affirmation, and encouragement, and let [name] know of ongoing chaplain availability.  Chaplain Elaf Clauson, MDiv [phone] 

## 2012-01-03 NOTE — Progress Notes (Signed)
Subjective:and and and and and and andPostpartum Day 3 Cesarean Delivery Patient reports tolerating PO and no problems voiding.    Objective: Vital signs in last 24 hours: Temp:  [97.9 F (36.6 C)-98.3 F (36.8 C)] 98.3 F (36.8 C) (05/20 1755) Pulse Rate:  [88-114] 93  (05/20 1755) Resp:  [18-20] 18  (05/20 1755) BP: (113-126)/(70-78) 126/70 mmHg (05/20 1755) SpO2:  [99 %-100 %] 100 % (05/20 1755)  Physical Exam:  General: alert, cooperative and no distress Lochia: appropriate Uterine Fundus: firm Incision: healing well DVT Evaluation: No evidence of DVT seen on physical exam.   Basename 01/01/12 0509  HGB 8.0*  HCT 26.2*    Assessment/Plan: Status post Cesarean section. Doing well postoperatively.  Continue current care.  Fortino Sic 01/03/2012, 9:29 PM

## 2012-01-03 NOTE — Progress Notes (Signed)
UR chart review completed.  

## 2012-01-04 ENCOUNTER — Encounter (HOSPITAL_COMMUNITY): Payer: Self-pay | Admitting: *Deleted

## 2012-01-04 ENCOUNTER — Other Ambulatory Visit (HOSPITAL_COMMUNITY): Payer: Managed Care, Other (non HMO)

## 2012-01-04 ENCOUNTER — Ambulatory Visit (HOSPITAL_COMMUNITY): Payer: Managed Care, Other (non HMO)

## 2012-01-04 LAB — GLUCOSE, CAPILLARY
Glucose-Capillary: 141 mg/dL — ABNORMAL HIGH (ref 70–99)
Glucose-Capillary: 176 mg/dL — ABNORMAL HIGH (ref 70–99)

## 2012-01-04 MED ORDER — INSULIN NPH (HUMAN) (ISOPHANE) 100 UNIT/ML ~~LOC~~ SUSP
10.0000 [IU] | Freq: Every day | SUBCUTANEOUS | Status: DC
  Administered 2012-01-04: 10 [IU] via SUBCUTANEOUS
  Filled 2012-01-04: qty 10

## 2012-01-04 NOTE — Clinical Social Work Psychosocial (Signed)
    Clinical Social Work Department BRIEF PSYCHOSOCIAL ASSESSMENT 01/04/2012  Patient:  Abigail Hall, Abigail Hall     Account Number:  [id number]     Admit date:  12/31/2011  Clinical Social Worker:  Abigail Hall  Date/Time:  01/04/2012 01:30 PM  Referred by:  RN  Date Referred:  01/04/2012 Referred for  Other - See comment   Other Referral:   NICU Support   Interview type:  Patient Other interview type:    PSYCHOSOCIAL DATA Living Status:  FAMILY Admitted from facility:   Level of care:   Primary support name:  Abigail Hall Primary support relationship to patient:  FAMILY Degree of support available:   Abigail Hall is the baby's father and main support person. [name]'s family is also supportive.  MGM lives in [place].    CURRENT CONCERNS Current Concerns  None Noted   Other Concerns:    SOCIAL WORK ASSESSMENT / PLAN SW met briefly with [name] in her third floor room/318 to see how she has been doing since we initially met in March when she was a patient on the Antenatal unit.  SW assisted [name] in contacting [facility]IC in order to get a breastpump.  Visit was cut short due to a phone call.  SW told [name] that SW will follow up with her at a later time.  She states she still has SW's number and will call if she needs anything.   Assessment/plan status:   Other assessment/ plan:   Information/referral to community resources:    PATIENT'S/FAMILY'S RESPONSE TO PLAN OF CARE: [name] was very pleasant and states that she is doing well, although she is still in the hospital because of issues with her blood sugar.  She states that she has been leaving messages for [facility] and is having trouble getting confirmation that she can go pick up a breastpump.  She appreciated SW's assistance in contacting [facility] staff and thinks that she has it worked out.  SW advised she let the lactation consultant know if she continues to have trouble in this area.  [name] was very pleasant and states no other needs or concerns at this time.

## 2012-01-04 NOTE — Plan of Care (Signed)
Problem: Consults Goal: Diabetes Guidelines if Diabetic/Glucose > 140 If diabetic or lab glucose is > 140 mg/dl - Initiate Diabetes/Hyperglycemia Guidelines & Document Interventions  Outcome: Completed/Met Date Met:  01/04/12 Dr Neva Seat doing further testing today to determine home diabetes mgt

## 2012-01-04 NOTE — Progress Notes (Signed)
Subjective: Postpartum Day4: Cesarean Delivery Patient reports tolerating PO and no problems voiding.    FBS 140 today.  Objective: Vital signs in last 24 hours: Temp:  [97.8 F (36.6 C)-98.2 F (36.8 C)] 98.2 F (36.8 C) (05/21 1800) Pulse Rate:  [86-96] 86  (05/21 1800) Resp:  [18-20] 20  (05/21 1800) BP: (126-136)/(76-84) 128/84 mmHg (05/21 1800) SpO2:  [96 %-100 %] 96 % (05/21 1800)  Physical Exam:  General: cooperative and no distress Lochia: appropriate Uterine Fundus: firm Incision: healing well, no significant drainage DVT Evaluation: No evidence of DVT seen on physical exam.  No results found for this basename: HGB:2,HCT:2 in the last 72 hours  Assessment/Plan: Status post Cesarean section. Doing well postoperatively.  Continue current care. Check 2 hrs PC glucose\ Insulin NPH 10usq aths  Laurinda Carreno E 01/04/2012, 6:18 PM

## 2012-01-04 NOTE — Consult Note (Signed)
Inpatient Diabetes Program Recommendations  AACE/ADA: New Consensus Statement on Inpatient Glycemic Control (2009)  Target Ranges:  Prepandial:   less than 140 mg/dL      Peak postprandial:   less than 180 mg/dL (1-2 hours)      Critically ill patients:  140 - 180 mg/dL   Reason for Visit: consult per Dr. Chilton Si.  RN notified me to see patient today and make recommendations for home glucose control.  HgbA1C at 6.8%.  Normally, I would recommend Metformin 500 mg/day to start and possibly Amaryl 2 mg.  However, There is the potential for passing this drug to the baby in her breast milk.  Talked with Dr. Chilton Si who suggested NPH 10 units at HS.  I agree that this would be safe, as her fasting glucose since delivery are slightly elevated in mid-100's, and the NPH would help potentiate control the rest of the day if the fasting glucose is normal. I recommend that she see one of the endocrinologists from [facility] in [facility] for follow up, although I think she can control this with diet and exercise in time. P    Note: Pt states she was having lots of hypoglycemia in the evenings at bedtime during her last few weeks of pregnancy.  This most likely was due to using Regular insulin at supper time which peaks 2-4 hrs after given and can last up to 8 hrs.  Novolog is the drug of choice or the Humalog pen when financially the patient does not have coverage.  Do not recommend Regular be used for the supper meal.  It can be safe to cover breakfast into lunch with the NPH in the am.  But best regimen found in most recent research is NPH bid and Novolog/Humalog tidwc. Thank you, Lenor Coffin, RN, CNS, Diabetes Coordinator [phone])

## 2012-01-05 LAB — GLUCOSE, CAPILLARY
Glucose-Capillary: 111 mg/dL — ABNORMAL HIGH (ref 70–99)
Glucose-Capillary: 143 mg/dL — ABNORMAL HIGH (ref 70–99)
Glucose-Capillary: 193 mg/dL — ABNORMAL HIGH (ref 70–99)

## 2012-01-05 MED ORDER — IBUPROFEN 600 MG PO TABS: 600.0000 mg | ORAL_TABLET | Freq: Four times a day (QID) | ORAL | Status: AC | PRN

## 2012-01-05 MED ORDER — ACETAMINOPHEN ER 650 MG PO TBCR: 650.0000 mg | EXTENDED_RELEASE_TABLET | Freq: Three times a day (TID) | ORAL | Status: DC | PRN

## 2012-01-05 MED ORDER — INSULIN NPH (HUMAN) (ISOPHANE) 100 UNIT/ML ~~LOC~~ SUSP: 10.0000 [IU] | Freq: Every day | SUBCUTANEOUS | Status: DC

## 2012-01-05 NOTE — Progress Notes (Signed)
Visited with Chandra while she was in the NICU visiting baby, Malachi.  She is in good spirits and they are grateful that the baby is getting the care he needs.  Having a NICU stay was not something they expected, but they are coping with it appropriately.  They were grateful for prayer.  I offered compassionate listening and affirmation as Emmalie discussed her new role as mother.    Please page as needed.  [phone]  Kathleen Argue 11:31 AM   01/05/12 1100  Clinical Encounter Type  Visited With Patient  Visit Type Initial  Spiritual Encounters  Spiritual Needs Prayer  Stress Factors  Patient Stress Factors (Baby in NICU)

## 2012-01-05 NOTE — Progress Notes (Signed)
Pt teaching complete   Ambulated out   Pt to go to   wic for  Breast pump    And to office  For prescription  For sleep and pain

## 2012-01-05 NOTE — Progress Notes (Signed)
Subjective: Postpartum Day 5: Cesarean Delivery Patient reports tolerating PO, + flatus, + BM and no problems voiding.    Objective: Vital signs in last 24 hours: Temp:  [97.8 F (36.6 C)-98.3 F (36.8 C)] 97.8 F (36.6 C) (05/22 0516) Pulse Rate:  [85-89] 89  (05/22 0516) Resp:  [19-20] 19  (05/22 0516) BP: (128-134)/(82-84) 134/82 mmHg (05/22 0516) SpO2:  [96 %-100 %] 100 % (05/22 0516)  Physical Exam:  General: alert, cooperative and no distress Lochia: appropriate Uterine Fundus: firm Incision: healing well DVT Evaluation: No evidence of DVT seen on physical exam. Negative Homan's sign. No cords or calf tenderness. No significant calf/ankle edema.  No results found for this basename: HGB:2,HCT:2 in the last 72 hours  Assessment/Plan: Status post Cesarean section. Doing well postoperatively.  Discharge home with standard precautions and return to clinic in 4-6 weeks.  Aram Domzalski H. 01/05/2012, 12:35 PM

## 2012-01-10 NOTE — Discharge Summary (Signed)
Obstetric Discharge Summary Reason for Admission: cesarean section and nonreassuring fetal status Prenatal Procedures: ultrasound and BPP 2/10 Intrapartum Procedures: cesarean: low cervical, transverse Postpartum Procedures: none Complications-Operative and Postpartum: none HGB  Date Value Range Status  12/31/2011 9.4* 11.6-15.9 (g/dL) Final     Hemoglobin  Date Value Range Status  01/01/2012 8.0* 12.0-15.0 (g/dL) Final     DELTA CHECK NOTED     REPEATED TO VERIFY     HCT  Date Value Range Status  01/01/2012 26.2* 36.0-46.0 (%) Final  12/31/2011 29.5* 34.8-46.6 (%) Final    Physical Exam:  General: alert, cooperative and no distress Lochia: appropriate Uterine Fundus: firm Incision: healing well DVT Evaluation: No evidence of DVT seen on physical exam. Negative Homan's sign. No cords or calf tenderness. No significant calf/ankle edema.  Discharge Diagnoses: s/p CSx, nonreassuring fetal status (BPP 2/10) and poorly controlled gestational diabetes mellitus  ( probably class B and just not diagnosed yet)  and seizure disorder and macrosomia  Discharge Information: Date: 01/10/2012 Activity: pelvic rest Diet: routine Medications: PNV, Ibuprofen and tylenol and pt later came to office for percocet Condition: stable and improved and delivered Instructions: refer to practice specific booklet Discharge to: home   Newborn Data: Live born female  Birth Weight: 10 lb 9.1 oz (4793 g) APGAR: 1, 6  Home with NICU.  Abigail Sagen H. 01/10/2012, 12:08 PM

## 2012-01-14 ENCOUNTER — Ambulatory Visit (HOSPITAL_COMMUNITY): Payer: Managed Care, Other (non HMO)

## 2012-02-11 ENCOUNTER — Other Ambulatory Visit (HOSPITAL_BASED_OUTPATIENT_CLINIC_OR_DEPARTMENT_OTHER): Payer: Managed Care, Other (non HMO) | Admitting: Lab

## 2012-02-11 ENCOUNTER — Ambulatory Visit (HOSPITAL_BASED_OUTPATIENT_CLINIC_OR_DEPARTMENT_OTHER): Payer: Managed Care, Other (non HMO) | Admitting: Hematology & Oncology

## 2012-02-11 VITALS — BP 118/69 | HR 75 | Temp 97.1°F | Ht 64.0 in | Wt 289.0 lb

## 2012-02-11 DIAGNOSIS — D509 Iron deficiency anemia, unspecified: Secondary | ICD-10-CM

## 2012-02-11 DIAGNOSIS — O2690 Pregnancy related conditions, unspecified, unspecified trimester: Secondary | ICD-10-CM

## 2012-02-11 LAB — IRON AND TIBC
%SAT: 15 % — ABNORMAL LOW (ref 20–55)
Iron: 50 ug/dL (ref 42–145)
TIBC: 326 ug/dL (ref 250–470)
UIBC: 276 ug/dL (ref 125–400)

## 2012-02-11 LAB — CBC WITH DIFFERENTIAL (CANCER CENTER ONLY)
BASO#: 0 10*3/uL (ref 0.0–0.2)
BASO%: 0.5 % (ref 0.0–2.0)
EOS%: 2.6 % (ref 0.0–7.0)
Eosinophils Absolute: 0.2 10*3/uL (ref 0.0–0.5)
HCT: 30.2 % — ABNORMAL LOW (ref 34.8–46.6)
HGB: 9.5 g/dL — ABNORMAL LOW (ref 11.6–15.9)
LYMPH#: 2.6 10*3/uL (ref 0.9–3.3)
LYMPH%: 42.5 % (ref 14.0–48.0)
MCH: 17.8 pg — ABNORMAL LOW (ref 26.0–34.0)
MCHC: 31.5 g/dL — ABNORMAL LOW (ref 32.0–36.0)
MCV: 57 fL — ABNORMAL LOW (ref 81–101)
MONO#: 0.3 10*3/uL (ref 0.1–0.9)
MONO%: 5.4 % (ref 0.0–13.0)
NEUT#: 3 10*3/uL (ref 1.5–6.5)
NEUT%: 49 % (ref 39.6–80.0)
Platelets: 370 10*3/uL (ref 145–400)
RBC: 5.34 10*6/uL — ABNORMAL HIGH (ref 3.70–5.32)
RDW: 16.3 % — ABNORMAL HIGH (ref 11.1–15.7)
WBC: 6.1 10*3/uL (ref 3.9–10.0)

## 2012-02-11 LAB — RETICULOCYTES (CHCC)
ABS Retic: 108.2 10*3/uL (ref 19.0–186.0)
RBC.: 5.41 MIL/uL — ABNORMAL HIGH (ref 3.87–5.11)
Retic Ct Pct: 2 % (ref 0.4–2.3)

## 2012-02-11 LAB — CHCC SATELLITE - SMEAR

## 2012-02-11 LAB — FERRITIN: Ferritin: 52 ng/mL (ref 10–291)

## 2012-02-15 NOTE — Progress Notes (Signed)
This office note has been dictated.

## 2012-02-15 NOTE — Progress Notes (Signed)
CC:   Arlyce Harman, MD  DIAGNOSES: 1. Postpartum pregnancy. 2. Iron-deficiency anemia. 3. Thalassemia minor.  CURRENT THERAPY:  Oral iron.  INTERIM HISTORY:  Ms. Lipson comes in for followup.  She has done quite well.  She had her baby.  He was over 10 pounds when she was induced.  She had no issues with respect to the induction.  There was not a lot of bleeding.  When we last saw her, her hemoglobin was 8.  This certainly has improved.  She feels okay.  She has not had indigestion.  She has had no constipation.  There has been no leg swelling.  She has had no headache.  PHYSICAL EXAMINATION:  This is a well developed, well-nourished black female in no obvious distress.  Vital signs:  Temperature of 97.1, pulse 75, respiratory rate 20, blood pressure 118/69.  Weight is 189.  Head and neck:  Normocephalic, atraumatic skull.  There are no ocular or oral lesions.  There are no palpable cervical or supraclavicular lymph nodes. Lungs:  Clear bilaterally.  Cardiac:  Regular rate and rhythm with a normal S1 and S2.  There are no murmurs, rubs or bruits.  Abdomen:  Soft with good bowel sounds.  She has no fluid wave.  There is no palpable abdominal mass.  No palpable hepatosplenomegaly.  Extremities:  No clubbing, cyanosis or edema.  LABORATORY STUDIES:  White cell count is 6.1, hemoglobin 9.5, hematocrit 30.2, platelet count 370.  Ferritin is 52 with an iron saturation of 15%.  IMPRESSION:  Ms. Shadix is a 31 year old [name] with history of iron-deficiency anemia during her pregnancy.  She gave birth.  Everybody is doing fine.  For now, she is breast-feeding her baby.  I will just keep her on her prenatal vitamins.  I think this is reasonable with respect to the amount of iron that gets into the breast milk.  I do not see that there will be a real problem with her baby getting too much iron.  We will have her come back to see [place] in about 2 months or so  now.  By then, we should be seeing a nice bump in her blood count.  I am just so happy that everything well for her with respect to her baby.    ______________________________ [name], M.D. PRE/MEDQ  D:  02/15/2012  T:  02/15/2012  Job:  [id number]

## 2012-02-16 ENCOUNTER — Telehealth: Payer: Self-pay | Admitting: Hematology & Oncology

## 2012-02-16 ENCOUNTER — Ambulatory Visit: Payer: Managed Care, Other (non HMO) | Admitting: Hematology & Oncology

## 2012-02-16 ENCOUNTER — Other Ambulatory Visit: Payer: Managed Care, Other (non HMO) | Admitting: Lab

## 2012-02-16 NOTE — Telephone Encounter (Signed)
Mailed 8-26 schedule

## 2012-03-15 ENCOUNTER — Encounter (HOSPITAL_COMMUNITY): Payer: Self-pay | Admitting: Pharmacist

## 2012-03-16 ENCOUNTER — Encounter (HOSPITAL_COMMUNITY)
Admission: RE | Admit: 2012-03-16 | Discharge: 2012-03-16 | Disposition: A | Discharge status: Home / Self Care | Payer: Managed Care, Other (non HMO) | Source: Ambulatory Visit | Attending: Family Medicine | Admitting: Family Medicine

## 2012-03-16 ENCOUNTER — Encounter (HOSPITAL_COMMUNITY): Payer: Self-pay

## 2012-03-16 LAB — CBC
HCT: 30.5 % — ABNORMAL LOW (ref 36.0–46.0)
Hemoglobin: 9.4 g/dL — ABNORMAL LOW (ref 12.0–15.0)
MCH: 17.2 pg — ABNORMAL LOW (ref 26.0–34.0)
MCHC: 30.8 g/dL (ref 30.0–36.0)
MCV: 55.7 fL — ABNORMAL LOW (ref 78.0–100.0)
Platelets: 304 10*3/uL (ref 150–400)
RBC: 5.48 MIL/uL — ABNORMAL HIGH (ref 3.87–5.11)
RDW: 16 % — ABNORMAL HIGH (ref 11.5–15.5)
WBC: 8 10*3/uL (ref 4.0–10.5)

## 2012-03-16 LAB — SURGICAL PCR SCREEN
MRSA, PCR: NEGATIVE
Staphylococcus aureus: NEGATIVE

## 2012-03-16 NOTE — Patient Instructions (Addendum)
Your procedure is scheduled on:03/27/12  Enter through the Main Entrance at :1030am Pick up desk phone and dial [phone] and inform us of your arrival.  Please call [phone] if you have any problems the morning of surgery.  Remember: Do not eat after midnight:Sunday Do not drink after:8am Monday-see clear liquids sheet  Take these meds the morning of surgery with a sip of water: Keppra  DO NOT wear jewelry, eye make-up, lipstick,body lotion, or dark fingernail polish. Do not shave for 48 hours prior to surgery.  Patients discharged on the day of surgery will not be allowed to drive home.   Remember to use your Hibiclens as instructed.

## 2012-03-17 DIAGNOSIS — G40309 Generalized idiopathic epilepsy and epileptic syndromes, not intractable, without status epilepticus: Secondary | ICD-10-CM | POA: Insufficient documentation

## 2012-03-17 DIAGNOSIS — G40001 Localization-related (focal) (partial) idiopathic epilepsy and epileptic syndromes with seizures of localized onset, not intractable, with status epilepticus: Secondary | ICD-10-CM | POA: Insufficient documentation

## 2012-03-27 ENCOUNTER — Encounter (HOSPITAL_COMMUNITY): Payer: Self-pay | Admitting: *Deleted

## 2012-03-27 ENCOUNTER — Other Ambulatory Visit: Payer: Self-pay | Admitting: Obstetrics & Gynecology

## 2012-03-27 ENCOUNTER — Encounter (HOSPITAL_COMMUNITY): Payer: Self-pay | Admitting: Anesthesiology

## 2012-03-27 ENCOUNTER — Ambulatory Visit (HOSPITAL_COMMUNITY)
Admission: RE | Admit: 2012-03-27 | Discharge: 2012-03-27 | Disposition: A | Discharge status: Home / Self Care | Payer: Managed Care, Other (non HMO) | Source: Ambulatory Visit | Attending: Obstetrics & Gynecology | Admitting: Obstetrics & Gynecology

## 2012-03-27 ENCOUNTER — Encounter (HOSPITAL_COMMUNITY)
Admission: RE | Disposition: A | Discharge status: Home / Self Care | Payer: Self-pay | Source: Ambulatory Visit | Attending: Obstetrics & Gynecology

## 2012-03-27 ENCOUNTER — Ambulatory Visit (HOSPITAL_COMMUNITY): Payer: Managed Care, Other (non HMO) | Admitting: Anesthesiology

## 2012-03-27 DIAGNOSIS — N920 Excessive and frequent menstruation with regular cycle: Secondary | ICD-10-CM | POA: Insufficient documentation

## 2012-03-27 DIAGNOSIS — Z01818 Encounter for other preprocedural examination: Secondary | ICD-10-CM | POA: Insufficient documentation

## 2012-03-27 DIAGNOSIS — D252 Subserosal leiomyoma of uterus: Secondary | ICD-10-CM | POA: Insufficient documentation

## 2012-03-27 DIAGNOSIS — Z9851 Tubal ligation status: Secondary | ICD-10-CM

## 2012-03-27 DIAGNOSIS — Z302 Encounter for sterilization: Secondary | ICD-10-CM | POA: Insufficient documentation

## 2012-03-27 DIAGNOSIS — Z01812 Encounter for preprocedural laboratory examination: Secondary | ICD-10-CM | POA: Insufficient documentation

## 2012-03-27 DIAGNOSIS — E669 Obesity, unspecified: Secondary | ICD-10-CM | POA: Insufficient documentation

## 2012-03-27 DIAGNOSIS — N906 Unspecified hypertrophy of vulva: Secondary | ICD-10-CM | POA: Insufficient documentation

## 2012-03-27 HISTORY — PX: LAPAROSCOPIC TUBAL LIGATION: SHX1937

## 2012-03-27 HISTORY — PX: LABIOPLASTY: SHX1900

## 2012-03-27 LAB — SAMPLE TO BLOOD BANK

## 2012-03-27 LAB — PREGNANCY, URINE: Preg Test, Ur: NEGATIVE

## 2012-03-27 SURGERY — LIGATION, FALLOPIAN TUBE, LAPAROSCOPIC
Anesthesia: General | Site: Uterus | Wound class: Clean Contaminated

## 2012-03-27 MED ORDER — BUPIVACAINE HCL (PF) 0.25 % IJ SOLN
INTRAMUSCULAR | Status: DC | PRN
  Administered 2012-03-27: 30 mL

## 2012-03-27 MED ORDER — MEPERIDINE HCL 25 MG/ML IJ SOLN: 6.2500 mg | INTRAMUSCULAR | Status: DC | PRN

## 2012-03-27 MED ORDER — FENTANYL CITRATE 0.05 MG/ML IJ SOLN
INTRAMUSCULAR | Status: DC | PRN
  Administered 2012-03-27 (×3): 50 ug via INTRAVENOUS
  Administered 2012-03-27: 100 ug via INTRAVENOUS
  Administered 2012-03-27: 50 ug via INTRAVENOUS

## 2012-03-27 MED ORDER — OXYCODONE-ACETAMINOPHEN 7.5-325 MG PO TABS: 1.0000 | ORAL_TABLET | ORAL | Status: AC | PRN

## 2012-03-27 MED ORDER — NEOSTIGMINE METHYLSULFATE 1 MG/ML IJ SOLN
INTRAMUSCULAR | Status: DC | PRN
  Administered 2012-03-27 (×2): 2 mg via INTRAVENOUS

## 2012-03-27 MED ORDER — FENTANYL CITRATE 0.05 MG/ML IJ SOLN
INTRAMUSCULAR | Status: AC
  Administered 2012-03-27: 50 ug via INTRAVENOUS
  Filled 2012-03-27: qty 2

## 2012-03-27 MED ORDER — LACTATED RINGERS IR SOLN
Status: DC | PRN
  Administered 2012-03-27: 3000 mL

## 2012-03-27 MED ORDER — ONDANSETRON HCL 4 MG/2ML IJ SOLN
INTRAMUSCULAR | Status: AC
  Filled 2012-03-27: qty 2

## 2012-03-27 MED ORDER — CEFAZOLIN SODIUM-DEXTROSE 2-3 GM-% IV SOLR
2.0000 g | INTRAVENOUS | Status: AC
  Administered 2012-03-27: 2 g via INTRAVENOUS

## 2012-03-27 MED ORDER — SILVER NITRATE-POT NITRATE 75-25 % EX MISC
CUTANEOUS | Status: AC
  Filled 2012-03-27: qty 1

## 2012-03-27 MED ORDER — FENTANYL CITRATE 0.05 MG/ML IJ SOLN
25.0000 ug | INTRAMUSCULAR | Status: AC | PRN
  Administered 2012-03-27 (×6): 50 ug via INTRAVENOUS

## 2012-03-27 MED ORDER — ONDANSETRON HCL 4 MG/2ML IJ SOLN
INTRAMUSCULAR | Status: DC | PRN
  Administered 2012-03-27: 4 mg via INTRAVENOUS

## 2012-03-27 MED ORDER — LIDOCAINE HCL (CARDIAC) 20 MG/ML IV SOLN
INTRAVENOUS | Status: AC
  Filled 2012-03-27: qty 5

## 2012-03-27 MED ORDER — LACTATED RINGERS IV SOLN
INTRAVENOUS | Status: DC
  Administered 2012-03-27: 125 mL/h via INTRAVENOUS
  Administered 2012-03-27: 19:00:00 via INTRAVENOUS

## 2012-03-27 MED ORDER — FENTANYL CITRATE 0.05 MG/ML IJ SOLN
INTRAMUSCULAR | Status: AC
  Filled 2012-03-27: qty 5

## 2012-03-27 MED ORDER — MIDAZOLAM HCL 5 MG/5ML IJ SOLN
INTRAMUSCULAR | Status: DC | PRN
  Administered 2012-03-27: 2 mg via INTRAVENOUS

## 2012-03-27 MED ORDER — DEXAMETHASONE SODIUM PHOSPHATE 10 MG/ML IJ SOLN
INTRAMUSCULAR | Status: DC | PRN
  Administered 2012-03-27: 10 mg via INTRAVENOUS

## 2012-03-27 MED ORDER — ROCURONIUM BROMIDE 50 MG/5ML IV SOLN
INTRAVENOUS | Status: AC
  Filled 2012-03-27: qty 1

## 2012-03-27 MED ORDER — PROPOFOL 10 MG/ML IV EMUL
INTRAVENOUS | Status: DC | PRN
  Administered 2012-03-27: 180 mg via INTRAVENOUS
  Administered 2012-03-27: 50 mg via INTRAVENOUS
  Administered 2012-03-27: 20 mg via INTRAVENOUS

## 2012-03-27 MED ORDER — KETOROLAC TROMETHAMINE 30 MG/ML IJ SOLN
INTRAMUSCULAR | Status: DC | PRN
  Administered 2012-03-27: 30 mg via INTRAVENOUS

## 2012-03-27 MED ORDER — ROCURONIUM BROMIDE 100 MG/10ML IV SOLN
INTRAVENOUS | Status: DC | PRN
  Administered 2012-03-27 (×3): 10 mg via INTRAVENOUS
  Administered 2012-03-27: 30 mg via INTRAVENOUS
  Administered 2012-03-27: 10 mg via INTRAVENOUS

## 2012-03-27 MED ORDER — OXYCODONE-ACETAMINOPHEN 5-325 MG PO TABS
1.0000 | ORAL_TABLET | ORAL | Status: AC | PRN
  Administered 2012-03-27: 1 via ORAL

## 2012-03-27 MED ORDER — LIDOCAINE HCL (CARDIAC) 20 MG/ML IV SOLN
INTRAVENOUS | Status: DC | PRN
  Administered 2012-03-27: 50 mg via INTRAVENOUS

## 2012-03-27 MED ORDER — NEOSTIGMINE METHYLSULFATE 1 MG/ML IJ SOLN
INTRAMUSCULAR | Status: AC
  Filled 2012-03-27: qty 10

## 2012-03-27 MED ORDER — DEXAMETHASONE SODIUM PHOSPHATE 10 MG/ML IJ SOLN
INTRAMUSCULAR | Status: AC
  Filled 2012-03-27: qty 1

## 2012-03-27 MED ORDER — GLYCOPYRROLATE 0.2 MG/ML IJ SOLN
INTRAMUSCULAR | Status: DC | PRN
  Administered 2012-03-27 (×2): 0.2 mg via INTRAVENOUS

## 2012-03-27 MED ORDER — MIDAZOLAM HCL 2 MG/2ML IJ SOLN
INTRAMUSCULAR | Status: AC
  Filled 2012-03-27: qty 2

## 2012-03-27 MED ORDER — PROPOFOL 10 MG/ML IV EMUL
INTRAVENOUS | Status: AC
  Filled 2012-03-27: qty 20

## 2012-03-27 MED ORDER — KETOROLAC TROMETHAMINE 30 MG/ML IJ SOLN
INTRAMUSCULAR | Status: AC
  Filled 2012-03-27: qty 1

## 2012-03-27 MED ORDER — METOCLOPRAMIDE HCL 5 MG/ML IJ SOLN: 10.0000 mg | Freq: Once | INTRAMUSCULAR | Status: DC | PRN

## 2012-03-27 MED ORDER — OXYCODONE-ACETAMINOPHEN 5-325 MG PO TABS
ORAL_TABLET | ORAL | Status: AC
  Filled 2012-03-27: qty 1

## 2012-03-27 MED ORDER — CEFAZOLIN SODIUM-DEXTROSE 2-3 GM-% IV SOLR
INTRAVENOUS | Status: AC
  Filled 2012-03-27: qty 50

## 2012-03-27 MED ORDER — BUPIVACAINE HCL (PF) 0.25 % IJ SOLN
INTRAMUSCULAR | Status: AC
  Filled 2012-03-27: qty 30

## 2012-03-27 SURGICAL SUPPLY — 31 items
ABLATOR ENDOMETRIAL BIPOLAR (ABLATOR) ×4 IMPLANT
CATH ROBINSON RED A/P 16FR (CATHETERS) ×3 IMPLANT
CLOTH BEACON ORANGE TIMEOUT ST (SAFETY) ×4 IMPLANT
COUNTER NEEDLE 1200 MAGNETIC (NEEDLE) IMPLANT
DRAPE UNDERBUTTOCKS STRL (DRAPE) ×1 IMPLANT
DRSG VASELINE 3X18 (GAUZE/BANDAGES/DRESSINGS) ×1 IMPLANT
ELECT REM PT RETURN 9FT ADLT (ELECTROSURGICAL) ×4
ELECTRODE REM PT RTRN 9FT ADLT (ELECTROSURGICAL) IMPLANT
GAUZE SPONGE 4X4 16PLY XRAY LF (GAUZE/BANDAGES/DRESSINGS) ×2 IMPLANT
GLOVE BIO SURGEON STRL SZ7.5 (GLOVE) ×4 IMPLANT
GLOVE BIOGEL PI IND STRL 8 (GLOVE) ×6 IMPLANT
GLOVE BIOGEL PI INDICATOR 8 (GLOVE) ×2
GLOVE ECLIPSE 8.0 STRL XLNG CF (GLOVE) ×4 IMPLANT
GOWN PREVENTION PLUS LG XLONG (DISPOSABLE) ×5 IMPLANT
GOWN STRL NON-REIN LRG LVL3 (GOWN DISPOSABLE) ×4 IMPLANT
GOWN STRL REIN XL XLG (GOWN DISPOSABLE) ×4 IMPLANT
PACK HYSTEROSCOPY LF (CUSTOM PROCEDURE TRAY) ×4 IMPLANT
PACK LAPAROSCOPY BASIN (CUSTOM PROCEDURE TRAY) ×4 IMPLANT
PACK VAGINAL MINOR WOMEN LF (CUSTOM PROCEDURE TRAY) ×4 IMPLANT
PENCIL BUTTON HOLSTER BLD 10FT (ELECTRODE) ×1 IMPLANT
SOLUTION ELECTROLUBE (MISCELLANEOUS) ×4 IMPLANT
SUT MNCRL AB 4-0 PS2 18 (SUTURE) ×1 IMPLANT
SUT MONOCRYL 3-0 PS-2 UND MONO (SUTURE) ×2 IMPLANT
SUT VICRYL 0 UR6 27IN ABS (SUTURE) ×4 IMPLANT
SUT VICRYL RAPIDE 3 0 (SUTURE) ×1 IMPLANT
SUT VICRYL RAPIDE 4/0 PS 2 (SUTURE) ×1 IMPLANT
TOWEL OR 17X24 6PK STRL BLUE (TOWEL DISPOSABLE) ×9 IMPLANT
TROCAR HASSON GELL 12X100 (TROCAR) ×1 IMPLANT
TROCAR Z-THREAD BLADED 11X100M (TROCAR) ×4 IMPLANT
WARMER LAPAROSCOPE (MISCELLANEOUS) ×4 IMPLANT
WATER STERILE IRR 1000ML POUR (IV SOLUTION) ×3 IMPLANT

## 2012-03-27 NOTE — Anesthesia Preprocedure Evaluation (Signed)
Anesthesia Evaluation  Patient identified by MRN, date of birth, ID band Patient awake    Reviewed: Allergy & Precautions, H&P , NPO status , Patient's Chart, lab work & pertinent test results  Airway Mallampati: II TM Distance: >3 FB Neck ROM: Full    Dental No notable dental hx. (+) Teeth Intact   Pulmonary  breath sounds clear to auscultation  Pulmonary exam normal       Cardiovascular negative cardio ROS  Rhythm:Regular Rate:Normal     Neuro/Psych  Headaches, Seizures -, Well Controlled,  PSYCHIATRIC DISORDERS Depression    GI/Hepatic negative GI ROS, Neg liver ROS,   Endo/Other  Well Controlled, GestationalMorbid obesity  Renal/GU negative Renal ROS Bladder dysfunction      Musculoskeletal negative musculoskeletal ROS (+)   Abdominal (+) + obese,   Peds  Hematology negative hematology ROS (+)   Anesthesia Other Findings   Reproductive/Obstetrics negative OB ROS                           Anesthesia Physical Anesthesia Plan  ASA: III  Anesthesia Plan: General   Post-op Pain Management:    Induction: Intravenous  Airway Management Planned: Oral ETT  Additional Equipment:   Intra-op Plan:   Post-operative Plan:   Informed Consent: I have reviewed the patients History and Physical, chart, labs and discussed the procedure including the risks, benefits and alternatives for the proposed anesthesia with the patient or authorized representative who has indicated his/her understanding and acceptance.   Dental advisory given  Plan Discussed with: CRNA, Anesthesiologist and Surgeon  Anesthesia Plan Comments:         Anesthesia Quick Evaluation

## 2012-03-27 NOTE — H&P (Signed)
Abigail Hall is an 31 y.o. female. That is here for a preop visit  For laparoscopic sterilization and menorrhagia  and also symptomatic redundant labia. The patient has been counseled regarding below with respect to alternatives of birth control.  The patient has decided upon laparoscopic bilateral tubal ligation/sterilization procedure.  Part of the influence of this is the fact that she has a seizure disorder and use of estrogen-containing contraception is relatively contraindicated.  However, even the use of progesterone for her has not always been beneficial in the past.  She does not want to undergo injections or hot or placement of an IUD.  Therefore we are opting as the best method for her and her family planning at this point is laparoscopic bilateral tubal ligation.  The patient states that she will sometimes bleed through her clothes that it is so heavy.  She states she uses super pads and tampons and has been unable to completely control this from a hygiene standpoint.  8 days.  She states that the seizures have increased in that her and her neurologist feel that it is partly because of her relative anemia.  She has required iron infusions in the past.  She was like to have a global endometrial ablation procedure performed she states when she is tired from the anemia this is with her seizures increase as well.  She also has a history of thalassemia minor.  She states that her labia gets caught in her clothes and also seemingly tear during intercourse.  She always thought it was normal until recent examination for postpartum annual exam.  We discussed symptoms she was having she did not understand that it was unclear that it was related to her labia and that this could be fixed.  We did discuss the solution of labioplasty and removal of the redundant labia that is on the left.  This is approximately a 7 x 7 cm extra portion of tissue.  Counseling:   Contraceptive counseling: Abstinence  was reviewed as the best method to avoid pregnancy.  Reviewed the different classes of birth control methods to include barrier vs. hormonal.  With barrier methods we discussed condoms, diaphragm, spermicides and the ParaGard IUD and also mentioned was a female condom.  I also mentioned the use of sterilization for the female or female.  We discussed how you would use each of these.  We also discussed the hormonal methods.  These included progesterone only.  This included a pill, an injection, a rod placed in your arm, and the Mirena IUD.  We discussed at each of these would be used.  We discussed how these can be taken systemically or not as with the IUD.  We discussed the risks and benefits of progesterone only contraception.  We also discussed combination hormonal contraception with estrogen and progesterone.  We discussed how this could be administered with a ring, a patch, pill form, and even a chewable pill form.  We also discussed how these could be used to manipulate cycle control.  We also discussed the risks of combination hormonal contraception to include but not limited to possible elevations in blood pressure, possible problems with glucose/sugar metabolism, and more serious problems is that it would have to be taken every day (compliance), reduced effectiveness with the use of other medications specifically antibiotics.  And the most serious risk would be that she could develop a deep vein thrombosis and/or pulmonary embolus.    The patient was counseled on the risks benefits and   alternatives of a sterilization procedure.  She is aware of the many other methods for contraception.  She is aware of the benefit of sterilization and not having to worry on an intermittent basis regarding contraception.  The risks specific to the procedure are that it is considered irreversible, that is considered permanent, that she understands she can no longer get pregnant or desire pregnancy, that the procedure has a  failure rate of approximately 4-10 in a 1000 woman years.  With the failures half would be in the uterus an unwanted pregnancies and that would be ectopic pregnancies.  The pregnancy that is an ectopic is potentially fatal.  The patient would need to seek immediate medical attention if she was ever found to be pregnant in the future until location of the pregnancy could be discovered.  She is also aware that any intra-abdominal procedure could potentially cause damage to other intra-abdominal organs to include bowel, bladder, ovaries and possibly a hernia of the abdominal wall.  Sometimes these complications are recognized at the time of surgery and are remedied at that time with the potential for a mother surgeon and the potential for a larger incision, and the possibility of a prolonged hospital stay..  Sometimes these complications are not recognized at the time of surgery and would require intervention at a later date when they are discovered.  It is important to notify my office or myself or go to the emergency room with any postoperative complications.  The patient is also aware of the increased risk of scarring and the potential development of a not satisfactory cosmetic result and the potential development of an infection that may require antibiotics as an inpatient or as an outpatient..  All questions were answered from the patient and the patient wishes to proceed and I concur.  Transfusion counseling: The patient is aware of the possibility with this procedure of the use of blood replacement products in the form of a transfusion.  The patient is aware of the risks benefits and alternatives of these.  She is aware that we would only use these on an emergency basis or sometimes if considered to significantly reduce the risk of transfusion requirement during surgery.  The patient is aware of the risk of the each unit of transfusion having a risk of HIV being approximately 1 in 1 million and having a risk  of hepatitis being 1 in 2000, with sometimes the development of chronic active hepatitis bleeding to the potential for liver failure and liver transplant, and having a low substantial risk of transfusion reaction were her body would reject the replacement when we thought she needed it.  The patient understands them wishes to proceed.  The patient is well aware of the risks, benefits, and alternatives of the hysteroscopy D&C.  She understands the alternatives are to do no invasive procedure and to monitor, to use hormonal manipulation, to use nonsteroidal anti-inflammatory drugs for improvement, and to potentially use an IUD.  However, she has opted for more definitive management.  The patient is also aware of the benefits of being able to get a sample of the endometrium, being able to clearly see the intrauterine anatomy, and being able to treat a potential problem.  She is also aware of the risks of the procedure.  The risk of infection and potential scarring in the intrauterine area.  She is also aware the potential for perforation and then the potential for diagnostic laparoscopy and/or hysterectomy depending on the location of the perforation.    She is also aware of the potential for this procedure to not control her bleeding in the future.  She is also aware the potential for abnormal discharge and bleeding after the procedure.  She was counseled to contact us if she has any problems of increasing pain, lower abdominal pain, abnormal uterine bleeding, elevated temperature, malodorous discharge or for any problems potentially related to the surgery.  Counseling: Global endometrial ablation counseling-the patient is aware of the risks, benefits, and alternatives of the procedure.  She is well aware that this procedure does not have to be done, and the potential for treatment with other modalities including IUD, hormonal manipulation, nonsteroidal anti-inflammatory drugs, hysteroscopy with D&C by itself, and  possibly even hysterectomy.  She is aware of the potential benefits of and improved bleeding pattern 50-90% of the time as evidenced by patient's after surgery at the 3 year mark being satisfied with her bleeding pattern.  She is also aware the fact that she may become amenorrheic.  The patient is also aware of the potential risks of the surgery that include scarring which is almost certain to happen and would preclude potential future fertility.  She understands that to get the global endometrial ablation procedure she has agreed to not pursue fertility.  She is also aware of the possibility of her for ration as with hysteroscopy and that she may require laparoscopy and/or hysterectomy depending on the location of the perforation.  She is also aware that we use different modalities to ablate the lining of the uterus and that these procedures typically use heat and that sometimes internal organs including bowel and bladder can be by this.  If damage recognized the time of the surgery it would require repair and probably another surgeon.  Sometimes in a paternal damage does not recognized at the time of surgery and will be recognized at a later date and the patient may need followup and she may need surgery at a later date as well.  The patient is also aware of the risk of infection.  The patient is also aware that in the future her bleeding pattern may become irregular, regular, with very light menses or with no menses at all.  She may also experience mucoid discharge during her cycle and no bleeding.     Consent: The patient has been consented regarding the risks, benefits, and alternatives of the procedure.  She understands that the risks are to include infection, scarring, not, being successful, and possible perforation of possible bleeding, and possible hysterectomy although these are unlikely.  She is aware to seek medical attention if she thinks she has a fever, or any pain.  That is more than menstrual  cramping.  She is also aware of the potential perforation is there and that she will get a laparoscopy. .  Is also aware of the fact that the ablation.  A works 60-90% of the time.  With his satisfaction being her satisfaction personally with the outcome with regard to her bleeding.  She is also aware that the ablation technique is with heat.  And that this may potentially cause, damage to other organs, although that is not suspected in her case, but that it is possible in this may not be recognized.  The time of surgery, but need to be recognized/diagnosed/repaired at a later date.  She also aware the, benefits, hopefully, reduce the amount of bleedingShe is also aware of the rare risk of transfusion.  That would then include with each in a   one of many risk of HIV, one in 2000 risk of hepatitis and the biggest risk of a transfusion complication.  Would be her body would reject it will make that she needed.  Once again, I do not suspect that this will occur.  The patient is also aware of the alternatives.  That are mentioned above.  They do include hormonal manipulation, possible IUD placement.  Possible hysterectomy, possible continued nonsteroidal anti-inflammatory drug use.    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    Past Medical History  Diagnosis Date  . Hemorrhoids   . Rectal bleeding   . Constipation   . Nausea & vomiting   . Rectal  pain   . Weakness   . Seizures     last seizure was in June 2013  . Generalized headaches   . Anemia   . Thalassemia   . Diabetes mellitus     gestational DM 2013 pregnancy  . Depression     Past Surgical History  Procedure Date  . Tongue surgery   . Laparoscopy   . Carpal tunnel release 01/2009  . Wisdom tooth extraction   . Cesarean section 12/31/2011    Procedure: CESAREAN SECTION;  Surgeon: Kaelani Kendrick H Tiffani Kadow, MD;  Location: [facility];  Service: Gynecology;  Laterality: N/A;    Family History  Problem Relation Age of Onset  . Anesthesia problems Neg Hx   . Hypotension Neg Hx   . Malignant hyperthermia Neg Hx   . Pseudochol deficiency Neg Hx   . Diabetes Mother   . Hyperlipidemia Father   . Diabetes Maternal Grandmother   . Cancer Maternal Grandfather   . Hyperlipidemia Paternal Grandfather     Social History:  reports that she has never smoked. She has never used smokeless tobacco. She reports that she does not drink alcohol or use illicit drugs. married in a safe relationship  Allergies:  Allergies  Allergen Reactions  . Latex Itching, Swelling and Rash    Where touched.   Medicines Keppra, Prenatal Plus, Foley cath allergies: Latex  (Not in a hospital admission)  Review of Systems  Constitutional: Negative.  Negative for fever and chills.  HENT: Negative for hearing loss, congestion, sore throat and tinnitus.   Eyes: Negative.  Negative for blurred vision and discharge.  Respiratory: Negative for cough, shortness of breath and wheezing.   Cardiovascular: Negative.  Negative for chest pain and palpitations.  Gastrointestinal: Positive for heartburn. Negative for nausea, vomiting, abdominal pain, diarrhea, blood in stool and melena.  Genitourinary: Negative for dysuria, urgency, frequency, hematuria and flank pain.  Musculoskeletal: Negative for joint pain.  Skin: Negative for itching and rash.  Neurological: Positive for seizures and headaches. Negative for  dizziness, tingling, sensory change and speech change.  Endo/Heme/Allergies: Does not bruise/bleed easily.  Psychiatric/Behavioral: Negative for depression and suicidal ideas.    Last menstrual period 02/25/2012, currently breastfeeding. Physical Exam  Constitutional: She is oriented to person, place, and time. She appears well-developed and well-nourished.         Above ideal body weight female  HENT:  Head: Normocephalic and atraumatic.  Mouth/Throat: Oropharynx is clear and moist.  Eyes: Conjunctivae and EOM are normal. Pupils are equal, round, and reactive to light. Right eye exhibits no discharge. Left eye exhibits no discharge.  Neck: Normal range of motion. Neck supple. No tracheal deviation present. No thyromegaly present.  Cardiovascular: Normal rate, regular rhythm and normal heart sounds.   Respiratory: Effort normal and breath sounds normal. No respiratory distress. She has no wheezes.  GI: Soft. Bowel sounds are normal. She exhibits no mass. There is no tenderness. There is no rebound and no guarding.  Genitourinary: Uterus normal. No vaginal discharge found.       The labia are redundant - left greater than right and the total length is 12 cm  Musculoskeletal: Normal range of motion. She exhibits no edema and no tenderness.  Neurological: She is alert and oriented to person, place, and time.  Skin: Skin is warm and dry. No rash noted.  Psychiatric: She has a normal mood and affect. Her behavior is normal. Judgment and thought content normal.   External genitalia within normal limits.  Introitus slightly agape.  Vaginal mucosa has normal color and normal.  Rugae, well estrogenized cystocele is noted, grade 2 uterine prolapse, grade 1.  Cervix without lesions, grossly, and parous. Bimanual exam reveals no discrete points of tenderness, or nodules.  Uterus is not enlarged.  Ovaries not enlarged  No results found for this or any previous visit (from the past 24 hour(s)).  No  results found.  Assessment/Plan: Desired sterilization Menometrorrhagia  --hemoglobin 9.2 today Symptomatic redundant labia Multiple medical problems including seizure disorder, history of gestational diabetes mellitus, obesity, anemia, thalassemia, history of hemorrhoids, history of epistaxis, insomnia   Plan  Laparoscopic sterilization Hysteroscopy D&C with GEA  Removal of redundant labia     Abigail Matsumura H. 03/27/2012, 9:14 AM   

## 2012-03-27 NOTE — Interval H&P Note (Signed)
History and Physical Interval Note:  03/27/2012 2:54 PM  Abigail Hall  has presented today for surgery, with the diagnosis of sterilization, menorrhagia, symptomatic redundant labia  The various methods of treatment have been discussed with the patient and family. After consideration of risks, benefits and other options for treatment, the patient has consented to  Procedure(s) (LRB): LAPAROSCOPIC TUBAL LIGATION (Bilateral) DILATATION & CURETTAGE/HYSTEROSCOPY WITH NOVASURE ABLATION (N/A) LABIAPLASTY (N/A) as a surgical intervention .  The patient's history has been reviewed, patient examined, no change in status, stable for surgery.  I have reviewed the patient's chart and labs.  Questions were answered to the patient's satisfaction.     Arshawn Valdez H.

## 2012-03-27 NOTE — Anesthesia Postprocedure Evaluation (Signed)
  Anesthesia Post-op Note  Patient: Abigail Hall  Procedure(s) Performed: Procedure(s) (LRB): LAPAROSCOPIC TUBAL LIGATION (Bilateral) DILATATION & CURETTAGE/HYSTEROSCOPY WITH NOVASURE ABLATION (N/A) LABIAPLASTY (N/A)  Patient Location: PACU  Anesthesia Type: General  Level of Consciousness: awake, alert  and oriented  Airway and Oxygen Therapy: Patient Spontanous Breathing  Post-op Pain: none  Post-op Assessment: Post-op Vital signs reviewed, Patient's Cardiovascular Status Stable, Respiratory Function Stable, Patent Airway, No signs of Nausea or vomiting and Pain level controlled  Post-op Vital Signs: Reviewed and stable  Complications: No apparent anesthesia complications

## 2012-03-27 NOTE — Transfer of Care (Signed)
Immediate Anesthesia Transfer of Care Note  Patient: Abigail Hall  Procedure(s) Performed: Procedure(s) (LRB): LAPAROSCOPIC TUBAL LIGATION (Bilateral) DILATATION & CURETTAGE/HYSTEROSCOPY WITH NOVASURE ABLATION (N/A) LABIAPLASTY (N/A)  Patient Location: PACU  Anesthesia Type: General  Level of Consciousness: sedated  Airway & Oxygen Therapy: Patient Spontanous Breathing  Post-op Assessment: Report given to PACU RN  Post vital signs: Reviewed and stable  Complications: No apparent anesthesia complications

## 2012-03-27 NOTE — Op Note (Signed)
03/27/2012  5:56 PM  PATIENT:  Abigail Hall  31 y.o. female  PRE-OPERATIVE DIAGNOSIS:  sterilization, menorrhagia, symptomatic redundant labia, obesity  POST-OPERATIVE DIAGNOSIS:  sterilization, menorrhagia, symptomatic redundant labia,, obesity, fibroid uterus  PROCEDURE:  Procedure(s): LAPAROSCOPIC TUBAL LIGATION DILATATION & CURETTAGE/HYSTEROSCOPY WITH NOVASURE ABLATION LABIAPLASTY  SURGEON:  Surgeon(s): Delbert Harness, MD  PHYSICIAN ASSISTANT:   ASSISTANTS: none   ANESTHESIA:   general  EBL:  Total I/O In: 1700 [I.V.:1700] Out: 575 [Urine:500; Blood:75]  BLOOD ADMINISTERED:none  DRAINS: none   LOCAL MEDICATIONS USED:  MARCAINE     SPECIMEN:  Source of Specimen:  endometrial currettings  DISPOSITION OF SPECIMEN:  PATHOLOGY  COUNTS:  YES  TOURNIQUET:  * No tourniquets in log *  DICTATION: .Dragon Dictation  PLAN OF CARE: Discharge to home after PACU  PATIENT DISPOSITION:  PACU - hemodynamically stable.     Indications: Please see detailed history and physical but the patient had menorrhagia and history of anemia which complicates her seizure disorder, thalassemia and previous need for iron infusions.  The patient also desires sterilization.  Patient also had a symptomatic redundant left labia and has opted for treatment.  All questions regimen patient wished to proceed.  Findings: The the uterine cavity had a significant amount of endometrium noted quite difficult to fully visualize cavity.  The post-curettage of post ablation evaluation revealed normal endometrial cavity.  The uterus sounded to 9 cm the cervical length was 4 cm.  The NovaSure device was used.  The power was 69 was the time was 1 minute and 17 seconds.  The distending media deficit of lactated Ringer's was 120 ml The specimen was endometrial curettings which were passed off for specimen the diagnosis.  The labia plus removed a 7 cm x 4 cm portion of the left labia.  Good hemostasis was  observed.  The laparoscopic portion procedure revealed a normal upper abdomen normal tubes was approximately 100 mL of bloody tinged fluid most likely from the distending media the hysteroscopy.  The were for some serosal fibroids noted.  Uterus otherwise would've been unremarkable and the tubes were unremarkable and the ovaries were within normal limits.  Taken.  There was a fibroid in the right lower uterine segment the left posterior fundus the right posterior corpus and the second lower uterine segment posteriorly which was also small.  All were less than 4 cm.  The ovaries were within normal limits.  There was a cyst noted the left ovary.  The tubes were noted to be completely desiccated.  2 repetitive glistening junction.  Good fascial integrity was noted and good hemostasis noted the umbilical wound and also on reevaluation of the cervix and also reevaluation of the left labioplasty.   Description procedure.  Patient was identified in the preoperative holding area consent was noted to be signed.  The patient had no questions and was able to reiterate the procedure.  Patient was moved to the operating room.  She was placed on the table supine position.  Patient was then given general anesthesia without difficulty.  And a tracheal intubation was performed as well.  The patient was then positioned in the modified lithotomy position with use of bucket stirrups.  Patient was then prepped and draped in usual sterile fashion.  A Foley cath was inserted.  The patient underwent active time out to match patient with procedure, make sure the [place] jaw, make sure there might have been given, and make sure the consent equal to procedure.  There were no special needs for blood products special equipment.  Hysteroscopic portion of the procedure began first of the cervix was dilated to admit a size 24 Comptroller.  This point the 5 mm scope sheath with the 4.5 mm hysteroscopic camera was inserted.  Above  findings were noted.  The lactated Ringer's was used as the distending media.  The hysteroscope was removed.    the uterine cavity was then completely curetted and the specimen passed off for histopathological diagnosis.  NovaSure device was administered to the uterine cavity.  The array was notified with 2.5 cm and the seal was noted to be appropriate in the device was activated for the above findings.  The hysteroscope was readministered in the uterine cavity above findings were noted.  The labioplasty was performed with use of 4 ounce clamps and then dividing the labia.  The knife blade was used for this.  Underlying subcutaneous bleeders were then made hemostatic with electrocautery.  Skin was then reapproximated with 4 Monocryl in a subcuticular running fashion.  Good hemostasis was noted.   A single-tooth tenaculum was then placed and anterior lip of the cervix.  This was adjoined to a Radiation protection practitioner.  Laparoscopic portion of the procedure began after external posterior exchanged.  This on technique was then utilized into the abdomen. A very thickened whitish Scarpa's layer was divided with stay sutures placed.  This would be later used for closure.  The fascia was finally discovered.  This is then elevated as Scarpa's layer was with Coker clamps.  Divided in the midline with a knife blade and then 2x 0 Vicryl stay sutures were placed.  The Hassan cannula was then placed.  The the Mirena internal obturator was then dilated with 10 mL of air.  The the stay sutures used to secure the [place] in place.  The pneumoperitoneum was created with CO2 gas the Veress and 50 mmHg noted throughout the entire procedure.  Angina.  Kleppinger bipolar paddles at 50 W was then utilized to completely desiccate a 7 cm was of the fallopian tube.  This was performed bilaterally.  Both tubes were cut at the glistening junction with scissors.  All 4 cut ends were then noted to be completely desiccated.   Desiccation was confirmed by visualization and the audible amp meter.  Polishers then removed from the abdomen the pneumoperitoneum was relieved.  The previously placed 0 Vicryl stay sutures in the fashion were used to reapproximate the fascia.  The stay sutures and Scarpa's layer was then used to reapproximate Scarpa's layer.  Was then closed with 4 Monocryl subcuticular interrupted fashion.  10 mL of quarter percent Marcaine was then inserted just lateral of the wound bilaterally.  The The single-tooth tenaculum was removed and a single-tooth tenaculum site was noted to be hemostatic.  A Cohen uterine manipulator was removed.  The Foley catheter was removed.  Vaseline gauze was placed over the labia a left.  Good hemostasis was noted.  The patient was replaced supine position reversal general anesthesia and taken in awake and stable condition.

## 2012-03-27 NOTE — H&P (View-Only) (Signed)
Abigail Hall is an 31 y.o. female. That is here for a preop visit  For laparoscopic sterilization and menorrhagia  and also symptomatic redundant labia. The patient has been counseled regarding below with respect to alternatives of birth control.  The patient has decided upon laparoscopic bilateral tubal ligation/sterilization procedure.  Part of the influence of this is the fact that she has a seizure disorder and use of estrogen-containing contraception is relatively contraindicated.  However, even the use of progesterone for her has not always been beneficial in the past.  She does not want to undergo injections or hot or placement of an IUD.  Therefore we are opting as the best method for her and her family planning at this point is laparoscopic bilateral tubal ligation.  The patient states that she will sometimes bleed through her clothes that it is so heavy.  She states she uses super pads and tampons and has been unable to completely control this from a hygiene standpoint.  8 days.  She states that the seizures have increased in that her and her neurologist feel that it is partly because of her relative anemia.  She has required iron infusions in the past.  She was like to have a global endometrial ablation procedure performed she states when she is tired from the anemia this is with her seizures increase as well.  She also has a history of thalassemia minor.  She states that her labia gets caught in her clothes and also seemingly tear during intercourse.  She always thought it was normal until recent examination for postpartum annual exam.  We discussed symptoms she was having she did not understand that it was unclear that it was related to her labia and that this could be fixed.  We did discuss the solution of labioplasty and removal of the redundant labia that is on the left.  This is approximately a 7 x 7 cm extra portion of tissue.  Counseling:   Contraceptive counseling: Abstinence  was reviewed as the best method to avoid pregnancy.  Reviewed the different classes of birth control methods to include barrier vs. hormonal.  With barrier methods we discussed condoms, diaphragm, spermicides and the ParaGard IUD and also mentioned was a female condom.  I also mentioned the use of sterilization for the female or female.  We discussed how you would use each of these.  We also discussed the hormonal methods.  These included progesterone only.  This included a pill, an injection, a rod placed in your arm, and the Mirena IUD.  We discussed at each of these would be used.  We discussed how these can be taken systemically or not as with the IUD.  We discussed the risks and benefits of progesterone only contraception.  We also discussed combination hormonal contraception with estrogen and progesterone.  We discussed how this could be administered with a ring, a patch, pill form, and even a chewable pill form.  We also discussed how these could be used to manipulate cycle control.  We also discussed the risks of combination hormonal contraception to include but not limited to possible elevations in blood pressure, possible problems with glucose/sugar metabolism, and more serious problems is that it would have to be taken every day (compliance), reduced effectiveness with the use of other medications specifically antibiotics.  And the most serious risk would be that she could develop a deep vein thrombosis and/or pulmonary embolus.    The patient was counseled on the risks benefits and  alternatives of a sterilization procedure.  She is aware of the many other methods for contraception.  She is aware of the benefit of sterilization and not having to worry on an intermittent basis regarding contraception.  The risks specific to the procedure are that it is considered irreversible, that is considered permanent, that she understands she can no longer get pregnant or desire pregnancy, that the procedure has a  failure rate of approximately 4-10 in a 1000 woman years.  With the failures half would be in the uterus an unwanted pregnancies and that would be ectopic pregnancies.  The pregnancy that is an ectopic is potentially fatal.  The patient would need to seek immediate medical attention if she was ever found to be pregnant in the future until location of the pregnancy could be discovered.  She is also aware that any intra-abdominal procedure could potentially cause damage to other intra-abdominal organs to include bowel, bladder, ovaries and possibly a hernia of the abdominal wall.  Sometimes these complications are recognized at the time of surgery and are remedied at that time with the potential for a mother surgeon and the potential for a larger incision, and the possibility of a prolonged hospital stay..  Sometimes these complications are not recognized at the time of surgery and would require intervention at a later date when they are discovered.  It is important to notify my office or myself or go to the emergency room with any postoperative complications.  The patient is also aware of the increased risk of scarring and the potential development of a not satisfactory cosmetic result and the potential development of an infection that may require antibiotics as an inpatient or as an outpatient..  All questions were answered from the patient and the patient wishes to proceed and I concur.  Transfusion counseling: The patient is aware of the possibility with this procedure of the use of blood replacement products in the form of a transfusion.  The patient is aware of the risks benefits and alternatives of these.  She is aware that we would only use these on an emergency basis or sometimes if considered to significantly reduce the risk of transfusion requirement during surgery.  The patient is aware of the risk of the each unit of transfusion having a risk of HIV being approximately 1 in 1 million and having a risk  of hepatitis being 1 in 2000, with sometimes the development of chronic active hepatitis bleeding to the potential for liver failure and liver transplant, and having a low substantial risk of transfusion reaction were her body would reject the replacement when we thought she needed it.  The patient understands them wishes to proceed.  The patient is well aware of the risks, benefits, and alternatives of the hysteroscopy D&C.  She understands the alternatives are to do no invasive procedure and to monitor, to use hormonal manipulation, to use nonsteroidal anti-inflammatory drugs for improvement, and to potentially use an IUD.  However, she has opted for more definitive management.  The patient is also aware of the benefits of being able to get a sample of the endometrium, being able to clearly see the intrauterine anatomy, and being able to treat a potential problem.  She is also aware of the risks of the procedure.  The risk of infection and potential scarring in the intrauterine area.  She is also aware the potential for perforation and then the potential for diagnostic laparoscopy and/or hysterectomy depending on the location of the perforation.  She is also aware of the potential for this procedure to not control her bleeding in the future.  She is also aware the potential for abnormal discharge and bleeding after the procedure.  She was counseled to contact us if she has any problems of increasing pain, lower abdominal pain, abnormal uterine bleeding, elevated temperature, malodorous discharge or for any problems potentially related to the surgery.  Counseling: Global endometrial ablation counseling-the patient is aware of the risks, benefits, and alternatives of the procedure.  She is well aware that this procedure does not have to be done, and the potential for treatment with other modalities including IUD, hormonal manipulation, nonsteroidal anti-inflammatory drugs, hysteroscopy with D&C by itself, and  possibly even hysterectomy.  She is aware of the potential benefits of and improved bleeding pattern 50-90% of the time as evidenced by patient's after surgery at the 3 year mark being satisfied with her bleeding pattern.  She is also aware the fact that she may become amenorrheic.  The patient is also aware of the potential risks of the surgery that include scarring which is almost certain to happen and would preclude potential future fertility.  She understands that to get the global endometrial ablation procedure she has agreed to not pursue fertility.  She is also aware of the possibility of her for ration as with hysteroscopy and that she may require laparoscopy and/or hysterectomy depending on the location of the perforation.  She is also aware that we use different modalities to ablate the lining of the uterus and that these procedures typically use heat and that sometimes internal organs including bowel and bladder can be by this.  If damage recognized the time of the surgery it would require repair and probably another surgeon.  Sometimes in a paternal damage does not recognized at the time of surgery and will be recognized at a later date and the patient may need followup and she may need surgery at a later date as well.  The patient is also aware of the risk of infection.  The patient is also aware that in the future her bleeding pattern may become irregular, regular, with very light menses or with no menses at all.  She may also experience mucoid discharge during her cycle and no bleeding.     Consent: The patient has been consented regarding the risks, benefits, and alternatives of the procedure.  She understands that the risks are to include infection, scarring, not, being successful, and possible perforation of possible bleeding, and possible hysterectomy although these are unlikely.  She is aware to seek medical attention if she thinks she has a fever, or any pain.  That is more than menstrual  cramping.  She is also aware of the potential perforation is there and that she will get a laparoscopy. .  Is also aware of the fact that the ablation.  A works 60-90% of the time.  With his satisfaction being her satisfaction personally with the outcome with regard to her bleeding.  She is also aware that the ablation technique is with heat.  And that this may potentially cause, damage to other organs, although that is not suspected in her case, but that it is possible in this may not be recognized.  The time of surgery, but need to be recognized/diagnosed/repaired at a later date.  She also aware the, benefits, hopefully, reduce the amount of bleedingShe is also aware of the rare risk of transfusion.  That would then include with each in a  one of many risk of HIV, one in 2000 risk of hepatitis and the biggest risk of a transfusion complication.  Would be her body would reject it will make that she needed.  Once again, I do not suspect that this will occur.  The patient is also aware of the alternatives.  That are mentioned above.  They do include hormonal manipulation, possible IUD placement.  Possible hysterectomy, possible continued nonsteroidal anti-inflammatory drug use.    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    Past Medical History  Diagnosis Date  . Hemorrhoids   . Rectal bleeding   . Constipation   . Nausea & vomiting   . Rectal  pain   . Weakness   . Seizures     last seizure was in June 2013  . Generalized headaches   . Anemia   . Thalassemia   . Diabetes mellitus     gestational DM 2013 pregnancy  . Depression     Past Surgical History  Procedure Date  . Tongue surgery   . Laparoscopy   . Carpal tunnel release 01/2009  . Wisdom tooth extraction   . Cesarean section 12/31/2011    Procedure: CESAREAN SECTION;  Surgeon: Delbert Harness, MD;  Location: [facility];  Service: Gynecology;  Laterality: N/A;    Family History  Problem Relation Age of Onset  . Anesthesia problems Neg Hx   . Hypotension Neg Hx   . Malignant hyperthermia Neg Hx   . Pseudochol deficiency Neg Hx   . Diabetes Mother   . Hyperlipidemia Father   . Diabetes Maternal Grandmother   . Cancer Maternal Grandfather   . Hyperlipidemia Paternal Grandfather     Social History:  reports that she has never smoked. She has never used smokeless tobacco. She reports that she does not drink alcohol or use illicit drugs. married in a safe relationship  Allergies:  Allergies  Allergen Reactions  . Latex Itching, Swelling and Rash    Where touched.   Medicines Keppra, Prenatal Plus, Foley cath allergies: Latex  (Not in a hospital admission)  Review of Systems  Constitutional: Negative.  Negative for fever and chills.  HENT: Negative for hearing loss, congestion, sore throat and tinnitus.   Eyes: Negative.  Negative for blurred vision and discharge.  Respiratory: Negative for cough, shortness of breath and wheezing.   Cardiovascular: Negative.  Negative for chest pain and palpitations.  Gastrointestinal: Positive for heartburn. Negative for nausea, vomiting, abdominal pain, diarrhea, blood in stool and melena.  Genitourinary: Negative for dysuria, urgency, frequency, hematuria and flank pain.  Musculoskeletal: Negative for joint pain.  Skin: Negative for itching and rash.  Neurological: Positive for seizures and headaches. Negative for  dizziness, tingling, sensory change and speech change.  Endo/Heme/Allergies: Does not bruise/bleed easily.  Psychiatric/Behavioral: Negative for depression and suicidal ideas.    Last menstrual period 02/25/2012, currently breastfeeding. Physical Exam  Constitutional: She is oriented to person, place, and time. She appears well-developed and well-nourished.  Above ideal body weight female  HENT:  Head: Normocephalic and atraumatic.  Mouth/Throat: Oropharynx is clear and moist.  Eyes: Conjunctivae and EOM are normal. Pupils are equal, round, and reactive to light. Right eye exhibits no discharge. Left eye exhibits no discharge.  Neck: Normal range of motion. Neck supple. No tracheal deviation present. No thyromegaly present.  Cardiovascular: Normal rate, regular rhythm and normal heart sounds.   Respiratory: Effort normal and breath sounds normal. No respiratory distress. She has no wheezes.  GI: Soft. Bowel sounds are normal. She exhibits no mass. There is no tenderness. There is no rebound and no guarding.  Genitourinary: Uterus normal. No vaginal discharge found.       The labia are redundant - left greater than right and the total length is 12 cm  Musculoskeletal: Normal range of motion. She exhibits no edema and no tenderness.  Neurological: She is alert and oriented to person, place, and time.  Skin: Skin is warm and dry. No rash noted.  Psychiatric: She has a normal mood and affect. Her behavior is normal. Judgment and thought content normal.   External genitalia within normal limits.  Introitus slightly agape.  Vaginal mucosa has normal color and normal.  Rugae, well estrogenized cystocele is noted, grade 2 uterine prolapse, grade 1.  Cervix without lesions, grossly, and parous. Bimanual exam reveals no discrete points of tenderness, or nodules.  Uterus is not enlarged.  Ovaries not enlarged  No results found for this or any previous visit (from the past 24 hour(s)).  No  results found.  Assessment/Plan: Desired sterilization Menometrorrhagia  --hemoglobin 9.2 today Symptomatic redundant labia Multiple medical problems including seizure disorder, history of gestational diabetes mellitus, obesity, anemia, thalassemia, history of hemorrhoids, history of epistaxis, insomnia   Plan  Laparoscopic sterilization Hysteroscopy D&C with [facility]  Removal of redundant labia     Janney Priego H. 03/27/2012, 9:14 AM

## 2012-03-28 ENCOUNTER — Encounter (HOSPITAL_COMMUNITY): Payer: Self-pay | Admitting: Obstetrics & Gynecology

## 2012-04-10 ENCOUNTER — Other Ambulatory Visit (HOSPITAL_BASED_OUTPATIENT_CLINIC_OR_DEPARTMENT_OTHER): Payer: Managed Care, Other (non HMO) | Admitting: Lab

## 2012-04-10 ENCOUNTER — Ambulatory Visit (HOSPITAL_BASED_OUTPATIENT_CLINIC_OR_DEPARTMENT_OTHER): Payer: Managed Care, Other (non HMO) | Admitting: Hematology & Oncology

## 2012-04-10 ENCOUNTER — Ambulatory Visit (HOSPITAL_BASED_OUTPATIENT_CLINIC_OR_DEPARTMENT_OTHER): Payer: Managed Care, Other (non HMO)

## 2012-04-10 ENCOUNTER — Encounter: Payer: Self-pay | Admitting: Hematology & Oncology

## 2012-04-10 VITALS — BP 117/73 | HR 76 | Temp 97.5°F | Resp 18 | Ht 64.0 in | Wt 254.0 lb

## 2012-04-10 DIAGNOSIS — D563 Thalassemia minor: Secondary | ICD-10-CM

## 2012-04-10 DIAGNOSIS — D509 Iron deficiency anemia, unspecified: Secondary | ICD-10-CM

## 2012-04-10 HISTORY — DX: Iron deficiency anemia, unspecified: D50.9

## 2012-04-10 LAB — CBC WITH DIFFERENTIAL (CANCER CENTER ONLY)
BASO#: 0 10*3/uL (ref 0.0–0.2)
BASO%: 0.3 % (ref 0.0–2.0)
EOS%: 1.5 % (ref 0.0–7.0)
Eosinophils Absolute: 0.1 10*3/uL (ref 0.0–0.5)
HCT: 27.9 % — ABNORMAL LOW (ref 34.8–46.6)
HGB: 8.8 g/dL — ABNORMAL LOW (ref 11.6–15.9)
LYMPH#: 2.6 10*3/uL (ref 0.9–3.3)
LYMPH%: 35.2 % (ref 14.0–48.0)
MCH: 17.6 pg — ABNORMAL LOW (ref 26.0–34.0)
MCHC: 31.5 g/dL — ABNORMAL LOW (ref 32.0–36.0)
MCV: 56 fL — ABNORMAL LOW (ref 81–101)
MONO#: 0.5 10*3/uL (ref 0.1–0.9)
MONO%: 6.7 % (ref 0.0–13.0)
NEUT#: 4.2 10*3/uL (ref 1.5–6.5)
NEUT%: 56.3 % (ref 39.6–80.0)
Platelets: 267 10*3/uL (ref 145–400)
RBC: 5.01 10*6/uL (ref 3.70–5.32)
RDW: 19.5 % — ABNORMAL HIGH (ref 11.1–15.7)
WBC: 7.5 10*3/uL (ref 3.9–10.0)

## 2012-04-10 LAB — TECHNOLOGIST REVIEW CHCC SATELLITE

## 2012-04-10 LAB — CHCC SATELLITE - SMEAR

## 2012-04-10 MED ORDER — SODIUM CHLORIDE 0.9 % IV SOLN
Freq: Once | INTRAVENOUS | Status: AC
  Administered 2012-04-10: 15:00:00 via INTRAVENOUS

## 2012-04-10 MED ORDER — ALTEPLASE 2 MG IJ SOLR
2.0000 mg | Freq: Once | INTRAMUSCULAR | Status: DC | PRN
  Filled 2012-04-10: qty 2

## 2012-04-10 MED ORDER — SODIUM CHLORIDE 0.9 % IV SOLN
1020.0000 mg | Freq: Once | INTRAVENOUS | Status: AC
  Administered 2012-04-10: 1020 mg via INTRAVENOUS
  Filled 2012-04-10: qty 34

## 2012-04-10 MED ORDER — SODIUM CHLORIDE 0.9 % IJ SOLN
10.0000 mL | INTRAMUSCULAR | Status: DC | PRN
  Filled 2012-04-10: qty 10

## 2012-04-10 MED ORDER — HEPARIN SOD (PORK) LOCK FLUSH 100 UNIT/ML IV SOLN
250.0000 [IU] | Freq: Once | INTRAVENOUS | Status: DC | PRN
  Filled 2012-04-10: qty 5

## 2012-04-10 MED ORDER — HEPARIN SOD (PORK) LOCK FLUSH 100 UNIT/ML IV SOLN
500.0000 [IU] | Freq: Once | INTRAVENOUS | Status: DC | PRN
Start: 2012-04-10 — End: 2012-04-10
  Filled 2012-04-10: qty 5

## 2012-04-10 MED ORDER — SODIUM CHLORIDE 0.9 % IJ SOLN
3.0000 mL | Freq: Once | INTRAMUSCULAR | Status: DC | PRN
  Filled 2012-04-10: qty 10

## 2012-04-10 NOTE — Progress Notes (Signed)
This office note has been dictated.

## 2012-04-11 NOTE — Progress Notes (Signed)
CC:   Abigail Harman, MD  DIAGNOSES: 1. Iron deficiency anemia. 2. Thalassemia minor. 3. Postpartum pregnancy.  CURRENT THERAPY:  The patient to receive Feraheme today.  INTERIM HISTORY:  Abigail Hall comes in for followup.  Her baby is now 3-1/2 months old.  He is off feeding.  He is getting formula now.  She feels a little bit tired.  She has had some issues with respect to uterine fibroids.  I think she underwent endometrial ablation.  She also says she had her tubes tied.  She has had no cough.  There is no problem with weight loss outside of that associated with her baby.  Back in June, ferritin was 62.  Iron saturation was 15%.  PHYSICAL EXAMINATION:  General:  This is a well-developed, well- nourished [name] in no obvious distress.  Vital signs: 97.5, pulse 76, respiratory rate 18, blood pressure 117/73.  Weight is 254.  Head and neck:  Normocephalic, atraumatic skull.  There are no ocular or oral lesions.  There are no palpable cervical or supraclavicular lymph nodes.  Lungs:  Clear bilaterally.  Cardiac: Regular rate and rhythm with normal S1 and S2.  There are no murmurs, rubs or bruits.  Abdomen:  Soft with good bowel sounds.  There is no palpable abdominal mass.  There is no palpable hepatosplenomegaly. Back:  No tenderness of the spine, ribs, or hips.  Extremities:  No clubbing, cyanosis or edema.  Skin:  No rashes, ecchymosis or petechia.  LABORATORY STUDIES:  White cell count is 7.5, hemoglobin 8.8, hematocrit 27.9, platelet count 267.  MCV is 56.  IMPRESSION:  Abigail Hall is a 31 year old [name] with iron deficiency anemia.  We have not been able to give her IV iron because of her breast-feeding.  She stopped this now.  Her hemoglobin is down.  I think we are going to have to give her IV iron now.  I realize that some of the low MCV is from her thalassemia minor. However, I think that iron deficiency clearly is an  issue.  We will go and give her Feraheme at a dose of 1020 mg.  I will then plan to get her back to see me in about 4-6 weeks.  By then, we should be seeing a nice rise in her hemoglobin.  Of note, she is not hemolyzing as her retic count corrected is less than 1.0.    ______________________________ [name], M.D. PRE/MEDQ  D:  04/10/2012  T:  04/11/2012  Job:  [id number]

## 2012-04-12 LAB — HEMOGLOBINOPATHY EVALUATION
Hemoglobin Other: 0 %
Hgb A2 Quant: 5.4 % — ABNORMAL HIGH (ref 2.2–3.2)
Hgb A: 92.8 % — ABNORMAL LOW (ref 96.8–97.8)
Hgb F Quant: 1.8 % (ref 0.0–2.0)
Hgb S Quant: 0 %

## 2012-04-12 LAB — IRON AND TIBC
%SAT: 20 % (ref 20–55)
Iron: 69 ug/dL (ref 42–145)
TIBC: 347 ug/dL (ref 250–470)
UIBC: 278 ug/dL (ref 125–400)

## 2012-04-12 LAB — RETICULOCYTES (CHCC)
ABS Retic: 129.5 10*3/uL (ref 19.0–186.0)
RBC.: 5.18 MIL/uL — ABNORMAL HIGH (ref 3.87–5.11)
Retic Ct Pct: 2.5 % — ABNORMAL HIGH (ref 0.4–2.3)

## 2012-04-12 LAB — FERRITIN: Ferritin: 64 ng/mL (ref 10–291)

## 2012-05-22 ENCOUNTER — Ambulatory Visit (HOSPITAL_BASED_OUTPATIENT_CLINIC_OR_DEPARTMENT_OTHER): Payer: Managed Care, Other (non HMO) | Admitting: Hematology & Oncology

## 2012-05-22 ENCOUNTER — Other Ambulatory Visit (HOSPITAL_BASED_OUTPATIENT_CLINIC_OR_DEPARTMENT_OTHER): Payer: Managed Care, Other (non HMO) | Admitting: Lab

## 2012-05-22 VITALS — BP 116/54 | HR 68 | Temp 98.0°F | Resp 16 | Ht 64.0 in | Wt 259.0 lb

## 2012-05-22 DIAGNOSIS — D509 Iron deficiency anemia, unspecified: Secondary | ICD-10-CM

## 2012-05-22 LAB — CBC WITH DIFFERENTIAL (CANCER CENTER ONLY)
BASO#: 0 10*3/uL (ref 0.0–0.2)
BASO%: 0.2 % (ref 0.0–2.0)
EOS%: 1.5 % (ref 0.0–7.0)
Eosinophils Absolute: 0.1 10*3/uL (ref 0.0–0.5)
HCT: 30 % — ABNORMAL LOW (ref 34.8–46.6)
HGB: 9.5 g/dL — ABNORMAL LOW (ref 11.6–15.9)
LYMPH#: 3.1 10*3/uL (ref 0.9–3.3)
LYMPH%: 34.5 % (ref 14.0–48.0)
MCH: 18 pg — ABNORMAL LOW (ref 26.0–34.0)
MCHC: 31.7 g/dL — ABNORMAL LOW (ref 32.0–36.0)
MCV: 57 fL — ABNORMAL LOW (ref 81–101)
MONO#: 0.6 10*3/uL (ref 0.1–0.9)
MONO%: 6.2 % (ref 0.0–13.0)
NEUT#: 5.1 10*3/uL (ref 1.5–6.5)
NEUT%: 57.6 % (ref 39.6–80.0)
Platelets: 305 10*3/uL (ref 145–400)
RBC: 5.27 10*6/uL (ref 3.70–5.32)
RDW: 20.1 % — ABNORMAL HIGH (ref 11.1–15.7)
WBC: 8.9 10*3/uL (ref 3.9–10.0)

## 2012-05-22 LAB — RETICULOCYTES (CHCC)
ABS Retic: 161.1 10*3/uL (ref 19.0–186.0)
RBC.: 5.37 MIL/uL — ABNORMAL HIGH (ref 3.87–5.11)
Retic Ct Pct: 3 % — ABNORMAL HIGH (ref 0.4–2.3)

## 2012-05-22 LAB — FERRITIN: Ferritin: 387 ng/mL — ABNORMAL HIGH (ref 10–291)

## 2012-05-22 LAB — IRON AND TIBC
%SAT: 27 % (ref 20–55)
Iron: 93 ug/dL (ref 42–145)
TIBC: 340 ug/dL (ref 250–470)
UIBC: 247 ug/dL (ref 125–400)

## 2012-05-22 NOTE — Progress Notes (Signed)
This office note has been dictated.

## 2012-05-23 NOTE — Progress Notes (Signed)
CC:   Abigail Harman, MD  DIAGNOSES: 1. Iron-deficiency anemia. 2. thalassemia minor.  CURRENT THERAPY:  IV iron as indicated.  INTERIM HISTORY:  Abigail Hall comes in for followup.  She is doing quite well.  Her baby continues to do well.  He is a very big baby that is very happy.  She has had IV iron.  I think she also has had an endometrial ablation.  Her last iron was given back on August 26th.  She got 1020 mg of IV iron.  At that point in time, her ferritin was 64 with an iron saturation of 20%.  She has had no "cravings."  She is she is not chewing ice.  PHYSICAL EXAMINATION:  This is a well-developed, well-nourished black female in no obvious distress.  Vital signs 98, pulse 68, respiratory rate 18, blood pressure 116/54.  Weight is 259.  Head and neck: Normocephalic, atraumatic skull.  There are no ocular or oral lesions. There are no palpable cervical or supraclavicular lymph nodes.  Lungs: Clear to percussion and auscultation bilaterally.  Cardiac:  Regular rate and rhythm with a normal S1 and S2.  There are no murmurs, rubs or bruits.  Abdomen:  Soft with good bowel sounds.  There is no palpable abdominal mass.  There is no fluid wave.  There is no palpable hepatosplenomegaly.  Back:  No tenderness of the spine, ribs, or hips. Extremities:  No clubbing, cyanosis or edema.  Neurological:  No focal neurological deficit.  LABORATORY STUDIES:  White cell count is 8.9, hemoglobin 9.5, hematocrit 30, platelet count is 305.  MCV is 57.  IMPRESSION:  Abigail Hall is 31 year old [name].  She is iron deficient.  She may have thalassemia minor, but this is clinically insignificant.  She said that she felt better after she got iron last time.  We will have to see what her iron stores are.  With her MCV being quite low, one might suspect that her iron might be on the lower side.  Once we get her iron results back, then we will let her know when  to come back in for followup.  I am glad that her baby is growing and doing well.    ______________________________ [name], M.D. PRE/MEDQ  D:  05/22/2012  T:  05/23/2012  Job:  [id number]

## 2012-05-24 ENCOUNTER — Telehealth: Payer: Self-pay | Admitting: *Deleted

## 2012-05-24 NOTE — Telephone Encounter (Addendum)
Message copied by Mirian Capuchin on Wed May 24, 2012  5:32 PM ------      Message from: Arlan Organ R      Created: Wed May 24, 2012  7:31 AM       Call and let her know that her iron is very good. She does not need any intravenous iron. Thanks. Cindee Lame This message left on pt's home voice mail.

## 2012-06-28 ENCOUNTER — Other Ambulatory Visit: Payer: Self-pay | Admitting: Hematology & Oncology

## 2012-06-28 DIAGNOSIS — D509 Iron deficiency anemia, unspecified: Secondary | ICD-10-CM

## 2012-06-28 DIAGNOSIS — D563 Thalassemia minor: Secondary | ICD-10-CM

## 2012-06-30 ENCOUNTER — Other Ambulatory Visit (HOSPITAL_BASED_OUTPATIENT_CLINIC_OR_DEPARTMENT_OTHER): Payer: Managed Care, Other (non HMO) | Admitting: Lab

## 2012-06-30 ENCOUNTER — Ambulatory Visit (HOSPITAL_BASED_OUTPATIENT_CLINIC_OR_DEPARTMENT_OTHER): Payer: Managed Care, Other (non HMO) | Admitting: Medical

## 2012-06-30 VITALS — BP 120/60 | HR 75 | Temp 98.2°F | Resp 16 | Ht 64.0 in | Wt 261.0 lb

## 2012-06-30 DIAGNOSIS — D563 Thalassemia minor: Secondary | ICD-10-CM

## 2012-06-30 DIAGNOSIS — D509 Iron deficiency anemia, unspecified: Secondary | ICD-10-CM

## 2012-06-30 LAB — CBC WITH DIFFERENTIAL (CANCER CENTER ONLY)
BASO#: 0 10*3/uL (ref 0.0–0.2)
BASO%: 0.5 % (ref 0.0–2.0)
EOS%: 1.7 % (ref 0.0–7.0)
Eosinophils Absolute: 0.1 10*3/uL (ref 0.0–0.5)
HCT: 30.3 % — ABNORMAL LOW (ref 34.8–46.6)
HGB: 9.5 g/dL — ABNORMAL LOW (ref 11.6–15.9)
LYMPH#: 2.3 10*3/uL (ref 0.9–3.3)
LYMPH%: 35.5 % (ref 14.0–48.0)
MCH: 18.4 pg — ABNORMAL LOW (ref 26.0–34.0)
MCHC: 31.4 g/dL — ABNORMAL LOW (ref 32.0–36.0)
MCV: 59 fL — ABNORMAL LOW (ref 81–101)
MONO#: 0.4 10*3/uL (ref 0.1–0.9)
MONO%: 6.3 % (ref 0.0–13.0)
NEUT#: 3.7 10*3/uL (ref 1.5–6.5)
NEUT%: 56 % (ref 39.6–80.0)
Platelets: 324 10*3/uL (ref 145–400)
RBC: 5.15 10*6/uL (ref 3.70–5.32)
RDW: 16.8 % — ABNORMAL HIGH (ref 11.1–15.7)
WBC: 6.5 10*3/uL (ref 3.9–10.0)

## 2012-06-30 LAB — IRON AND TIBC
%SAT: 27 % (ref 20–55)
Iron: 88 ug/dL (ref 42–145)
TIBC: 322 ug/dL (ref 250–470)
UIBC: 234 ug/dL (ref 125–400)

## 2012-06-30 LAB — FERRITIN: Ferritin: 359 ng/mL — ABNORMAL HIGH (ref 10–291)

## 2012-06-30 LAB — RETICULOCYTES (CHCC)
ABS Retic: 148.1 10*3/uL (ref 19.0–186.0)
RBC.: 5.29 MIL/uL — ABNORMAL HIGH (ref 3.87–5.11)
Retic Ct Pct: 2.8 % — ABNORMAL HIGH (ref 0.4–2.3)

## 2012-06-30 LAB — CHCC SATELLITE - SMEAR

## 2012-06-30 NOTE — Progress Notes (Signed)
Diagnoses: #1.  Iron deficiency anemia. #2 thalassemia minor.  Current therapy: IV iron as indicated.  Interim history: Abigail Hall presents today for an office followup visit.  Overall, she, reports, that she's doing relatively well.  She does complain of some fatigue.  She did have a baby boy about 6 months ago.  She reports, that she does have fibroids along with heavy menstrual cycles.  She is on Depakote for primary generalized seizures.  She does followup with a neurologist.  Her last dose of IV and was back on 04/10/2012.  At that point in time, her ferritin was 64, with an iron saturation of 20%.  She does not report any craving for ice.  She does report, that she recently had her menstrual cycle, which lasted for about 7 days and was heavy.  She denies any headaches, visual changes, or rashes.  She denies any nausea, vomiting, diarrhea, constipation, chest pain, shortness of breath, or cough.  She denies any fevers, chills, or night sweats.  She denies any lower leg swelling.  Her MCV is low today.  It is quite possible that her iron level is low, and she will need IV iron.  I informed her that once we get her iron panel back we will give her a call.  Review of Systems: Constitutional:Negative for malaise/fatigue, fever, chills, weight loss, diaphoresis, activity change, appetite change, and unexpected weight change.  HEENT: Negative for double vision, blurred vision, visual loss, ear pain, tinnitus, congestion, rhinorrhea, epistaxis sore throat or sinus disease, oral pain/lesion, tongue soreness Respiratory: Negative for cough, chest tightness, shortness of breath, wheezing and stridor.  Cardiovascular: Negative for chest pain, palpitations, leg swelling, orthopnea, PND, DOE or claudication Gastrointestinal: Negative for nausea, vomiting, abdominal pain, diarrhea, constipation, blood in stool, melena, hematochezia, abdominal distention, anal bleeding, rectal pain, anorexia and hematemesis.    Genitourinary: Negative for dysuria, frequency, hematuria,  Musculoskeletal: Negative for myalgias, back pain, joint swelling, arthralgias and gait problem.  Skin: Negative for rash, color change, pallor and wound.  Neurological:. Negative for dizziness/light-headedness, tremors, seizures, syncope, facial asymmetry, speech difficulty, weakness, numbness, headaches and paresthesias.  Hematological: Negative for adenopathy. Does not bruise/bleed easily.  Psychiatric/Behavioral:  Negative for depression, no loss of interest in normal activity or change in sleep pattern.   Physical Exam: This is a well-developed, well-nourished, 31 year old, [name], in no obvious distress Vitals: Temperature 90.2 degrees, pulse 75, respirations 16, blood pressure 120/60, weight 261 pounds HEENT reveals a normocephalic, atraumatic skull, no scleral icterus, no oral lesions  Neck is supple without any cervical or supraclavicular adenopathy.  Lungs are clear to auscultation bilaterally. There are no wheezes, rales or rhonci Cardiac is regular rate and rhythm with a normal S1 and S2. There are no murmurs, rubs, or bruits.  Abdomen is soft with good bowel sounds, there is no palpable mass. There is no palpable hepatosplenomegaly. There is no palpable fluid wave.  Musculoskeletal no tenderness of the spine, ribs, or hips.  Extremities there are no clubbing, cyanosis, or edema.  Skin no petechia, purpura or ecchymosis Neurologic is nonfocal.  Laboratory Data: White count 6.5, hemoglobin 9.5, hematocrit 30.3, platelets 324,000, MCV 59  Current Outpatient Prescriptions on File Prior to Visit  Medication Sig Dispense Refill  . divalproex (DEPAKOTE) 500 MG DR tablet Take 500 mg by mouth 2 (two) times daily.      . fluticasone (FLONASE) 50 MCG/ACT nasal spray Place 2 sprays into the nose daily.  16 g  1  .  folic acid (FOLVITE) 1 MG tablet Take 5 tablets (5 mg total) by mouth daily.  150 tablet  3  .  Prenatal Vit-Fe Fumarate-FA (PRENATAL MULTIVITAMIN) TABS Take 1 tablet by mouth at bedtime.       Marland Kitchen zolpidem (AMBIEN) 5 MG tablet Take 2 tablets (10 mg total) by mouth at bedtime as needed. As needed for insomnia  30 tablet  0   Assessment/Plan: This is a pleasant, 31 year old, African American, female, with the following issues:  #1.  Iron deficiency anemia.  We will wait and see what her iron stores are.  Her MCV is quite low, so, I would suspect, that her iron might be on the lower side as well.  Once we get her iron results back, we will let her know if she needs to come in for an IV iron infusion.  #2.  Thalassemia minor.  This is clinically insignificant.  #3.  Followup.  Abigail Hall were will follow back up with [place] in 6 weeks, but before then should there be questions or concerns.

## 2012-07-03 ENCOUNTER — Telehealth: Payer: Self-pay | Admitting: *Deleted

## 2012-07-03 NOTE — Telephone Encounter (Signed)
Called patient to let her know that her iron levels ,looked good per dr. Myna Hidalgo

## 2012-07-03 NOTE — Telephone Encounter (Signed)
Message copied by Anselm Jungling on Mon Jul 03, 2012  3:04 PM ------      Message from: [name]      Created: Sun Jul 02, 2012  8:28 PM       Call - iron is good.  Abigail Hall

## 2012-07-07 ENCOUNTER — Other Ambulatory Visit (HOSPITAL_COMMUNITY)
Admission: RE | Admit: 2012-07-07 | Discharge: 2012-07-07 | Disposition: A | Discharge status: Home / Self Care | Payer: Managed Care, Other (non HMO) | Source: Ambulatory Visit | Attending: Obstetrics & Gynecology | Admitting: Obstetrics & Gynecology

## 2012-07-07 ENCOUNTER — Other Ambulatory Visit: Payer: Self-pay | Admitting: Obstetrics & Gynecology

## 2012-07-07 DIAGNOSIS — Z124 Encounter for screening for malignant neoplasm of cervix: Secondary | ICD-10-CM | POA: Insufficient documentation

## 2012-07-07 DIAGNOSIS — Z1151 Encounter for screening for human papillomavirus (HPV): Secondary | ICD-10-CM | POA: Insufficient documentation

## 2012-07-19 ENCOUNTER — Other Ambulatory Visit (HOSPITAL_COMMUNITY): Payer: Self-pay | Admitting: Obstetrics & Gynecology

## 2012-07-19 DIAGNOSIS — R1011 Right upper quadrant pain: Secondary | ICD-10-CM

## 2012-07-25 ENCOUNTER — Ambulatory Visit (HOSPITAL_COMMUNITY)
Admission: RE | Admit: 2012-07-25 | Discharge: 2012-07-25 | Disposition: A | Discharge status: Home / Self Care | Payer: Managed Care, Other (non HMO) | Source: Ambulatory Visit | Attending: Obstetrics & Gynecology | Admitting: Obstetrics & Gynecology

## 2012-07-25 DIAGNOSIS — R1011 Right upper quadrant pain: Secondary | ICD-10-CM | POA: Insufficient documentation

## 2012-07-25 DIAGNOSIS — K824 Cholesterolosis of gallbladder: Secondary | ICD-10-CM | POA: Insufficient documentation

## 2012-08-11 ENCOUNTER — Other Ambulatory Visit: Payer: Managed Care, Other (non HMO) | Admitting: Lab

## 2012-08-11 ENCOUNTER — Ambulatory Visit: Payer: Managed Care, Other (non HMO) | Admitting: Hematology & Oncology

## 2012-08-18 ENCOUNTER — Ambulatory Visit: Payer: Managed Care, Other (non HMO) | Admitting: Hematology & Oncology

## 2012-08-18 ENCOUNTER — Telehealth: Payer: Self-pay | Admitting: Hematology & Oncology

## 2012-08-18 ENCOUNTER — Ambulatory Visit: Payer: Managed Care, Other (non HMO) | Admitting: Lab

## 2012-08-18 NOTE — Telephone Encounter (Signed)
Patient missed 08/18/12 apt.  She called and resch for 08/21/12

## 2012-08-21 ENCOUNTER — Ambulatory Visit (HOSPITAL_BASED_OUTPATIENT_CLINIC_OR_DEPARTMENT_OTHER): Payer: Managed Care, Other (non HMO) | Admitting: Medical

## 2012-08-21 ENCOUNTER — Other Ambulatory Visit (HOSPITAL_BASED_OUTPATIENT_CLINIC_OR_DEPARTMENT_OTHER): Payer: Managed Care, Other (non HMO) | Admitting: Lab

## 2012-08-21 VITALS — BP 113/65 | HR 80 | Temp 98.3°F | Resp 16 | Ht 64.0 in | Wt 262.0 lb

## 2012-08-21 DIAGNOSIS — D509 Iron deficiency anemia, unspecified: Secondary | ICD-10-CM

## 2012-08-21 DIAGNOSIS — D563 Thalassemia minor: Secondary | ICD-10-CM

## 2012-08-21 LAB — CBC WITH DIFFERENTIAL (CANCER CENTER ONLY)
BASO#: 0 10*3/uL (ref 0.0–0.2)
BASO%: 0.3 % (ref 0.0–2.0)
EOS%: 1 % (ref 0.0–7.0)
Eosinophils Absolute: 0.1 10*3/uL (ref 0.0–0.5)
HCT: 31.4 % — ABNORMAL LOW (ref 34.8–46.6)
HGB: 9.8 g/dL — ABNORMAL LOW (ref 11.6–15.9)
LYMPH#: 3.6 10*3/uL — ABNORMAL HIGH (ref 0.9–3.3)
LYMPH%: 44.8 % (ref 14.0–48.0)
MCH: 17.9 pg — ABNORMAL LOW (ref 26.0–34.0)
MCHC: 31.2 g/dL — ABNORMAL LOW (ref 32.0–36.0)
MCV: 57 fL — ABNORMAL LOW (ref 81–101)
MONO#: 0.5 10*3/uL (ref 0.1–0.9)
MONO%: 6.2 % (ref 0.0–13.0)
NEUT#: 3.8 10*3/uL (ref 1.5–6.5)
NEUT%: 47.7 % (ref 39.6–80.0)
Platelets: 361 10*3/uL (ref 145–400)
RBC: 5.49 10*6/uL — ABNORMAL HIGH (ref 3.70–5.32)
RDW: 16.8 % — ABNORMAL HIGH (ref 11.1–15.7)
WBC: 7.9 10*3/uL (ref 3.9–10.0)

## 2012-08-21 LAB — CHCC SATELLITE - SMEAR

## 2012-08-21 LAB — TECHNOLOGIST REVIEW CHCC SATELLITE

## 2012-08-21 NOTE — Progress Notes (Signed)
Diagnoses: #1.  Iron deficiency anemia. #2 thalassemia minor.  Current therapy: IV iron as indicated.  Last dose of IV iron was back on 04/10/2012.  Interim history: Abigail Hall presents today for an office followup visit.  Overall, she, reports, that she's doing relatively well.  She does complain of some fatigue.  She reports, that she does have fibroids along with heavy menstrual cycles.  She is on Depakote for primary generalized seizures.  She does followup with a neurologist.  Her last dose of IV Iron was back on 04/10/2012.  Her last iron panel.  Back in November revealed an iron of 88 and 27% saturation.  Ferritin 359.   She does not report any craving for ice.   She denies any headaches, visual changes, or rashes.  She denies any nausea, vomiting, diarrhea, constipation, chest pain, shortness of breath, or cough.  She denies any fevers, chills, or night sweats.  She denies any lower leg swelling.  Her MCV is low today.  It is quite possible that her iron level is low, and she will need IV iron.  I informed her that once we get her iron panel back we will give her a call.  Review of Systems: Constitutional:Negative for malaise/fatigue, fever, chills, weight loss, diaphoresis, activity change, appetite change, and unexpected weight change.  HEENT: Negative for double vision, blurred vision, visual loss, ear pain, tinnitus, congestion, rhinorrhea, epistaxis sore throat or sinus disease, oral pain/lesion, tongue soreness Respiratory: Negative for cough, chest tightness, shortness of breath, wheezing and stridor.  Cardiovascular: Negative for chest pain, palpitations, leg swelling, orthopnea, PND, DOE or claudication Gastrointestinal: Negative for nausea, vomiting, abdominal pain, diarrhea, constipation, blood in stool, melena, hematochezia, abdominal distention, anal bleeding, rectal pain, anorexia and hematemesis.  Genitourinary: Negative for dysuria, frequency, hematuria,  Musculoskeletal:  Negative for myalgias, back pain, joint swelling, arthralgias and gait problem.  Skin: Negative for rash, color change, pallor and wound.  Neurological:. Negative for dizziness/light-headedness, tremors, seizures, syncope, facial asymmetry, speech difficulty, weakness, numbness, headaches and paresthesias.  Hematological: Negative for adenopathy. Does not bruise/bleed easily.  Psychiatric/Behavioral:  Negative for depression, no loss of interest in normal activity or change in sleep pattern.   Physical Exam: This is a well-developed, well-nourished, 32 year old, [name], in no obvious distress Vitals: , temperature 90.3 degrees, pulse 80, respirations 16, blood pressure 113/65.  Weight 262 pounds  HEENT reveals a normocephalic, atraumatic skull, no scleral icterus, no oral lesions  Neck is supple without any cervical or supraclavicular adenopathy.  Lungs are clear to auscultation bilaterally. There are no wheezes, rales or rhonci Cardiac is regular rate and rhythm with a normal S1 and S2. There are no murmurs, rubs, or bruits.  Abdomen is soft with good bowel sounds, there is no palpable mass. There is no palpable hepatosplenomegaly. There is no palpable fluid wave.  Musculoskeletal no tenderness of the spine, ribs, or hips.  Extremities there are no clubbing, cyanosis, or edema.  Skin no petechia, purpura or ecchymosis Neurologic is nonfocal.  Laboratory Data:   white count 7.9, hemoglobin 9.8, hematocrit 31.4, MCV 57, platelets 361,000   Current Outpatient Prescriptions on File Prior to Visit  Medication Sig Dispense Refill  . divalproex (DEPAKOTE) 500 MG DR tablet Take 500 mg by mouth 2 (two) times daily.      . fluticasone (FLONASE) 50 MCG/ACT nasal spray Place 2 sprays into the nose daily.  16 g  1  . folic acid (FOLVITE) 1 MG tablet Take 5 tablets (  5 mg total) by mouth daily.  150 tablet  3  . Prenatal Vit-Fe Fumarate-FA (PRENATAL MULTIVITAMIN) TABS Take 1 tablet by  mouth at bedtime.       Marland Kitchen zolpidem (AMBIEN) 5 MG tablet Take 2 tablets (10 mg total) by mouth at bedtime as needed. As needed for insomnia  30 tablet  0   Assessment/Plan: This is a pleasant, 32 year old, African American, female, with the following issues:  #1.  Iron deficiency anemia.  We will wait and see what her iron stores are.  Her MCV is quite low, so, I would suspect, that her iron might be on the lower side as well.  Once we get her iron results back, we will let her know if she needs to come in for an IV iron infusion.  #2.  Thalassemia minor.  This is clinically insignificant.  #3.  Followup.  Abigail Hall were will follow back up with [place] in 6 weeks, but before then should there be questions or concerns.

## 2012-08-23 LAB — IRON AND TIBC
%SAT: 21 % (ref 20–55)
Iron: 80 ug/dL (ref 42–145)
TIBC: 386 ug/dL (ref 250–470)
UIBC: 306 ug/dL (ref 125–400)

## 2012-08-23 LAB — RETICULOCYTES (CHCC)
ABS Retic: 145.9 10*3/uL (ref 19.0–186.0)
RBC.: 5.61 MIL/uL — ABNORMAL HIGH (ref 3.87–5.11)
Retic Ct Pct: 2.6 % — ABNORMAL HIGH (ref 0.4–2.3)

## 2012-08-23 LAB — HEMOGLOBINOPATHY EVALUATION
Hemoglobin Other: 0 %
Hgb A2 Quant: 4.8 % — ABNORMAL HIGH (ref 2.2–3.2)
Hgb A: 92.9 % — ABNORMAL LOW (ref 96.8–97.8)
Hgb F Quant: 2.3 % — ABNORMAL HIGH (ref 0.0–2.0)
Hgb S Quant: 0 %

## 2012-08-23 LAB — FERRITIN: Ferritin: 404 ng/mL — ABNORMAL HIGH (ref 10–291)

## 2012-10-02 ENCOUNTER — Other Ambulatory Visit: Payer: Managed Care, Other (non HMO) | Admitting: Lab

## 2012-10-02 ENCOUNTER — Telehealth: Payer: Self-pay | Admitting: Hematology & Oncology

## 2012-10-02 ENCOUNTER — Ambulatory Visit: Payer: Managed Care, Other (non HMO) | Admitting: Hematology & Oncology

## 2012-10-02 NOTE — Telephone Encounter (Signed)
Pt cx 2-17 moved to 3-24 wants to see MD

## 2012-10-27 ENCOUNTER — Telehealth: Payer: Self-pay | Admitting: Hematology & Oncology

## 2012-10-27 NOTE — Telephone Encounter (Signed)
Moved time of 3-24

## 2012-11-02 ENCOUNTER — Telehealth: Payer: Self-pay | Admitting: Hematology & Oncology

## 2012-11-02 NOTE — Telephone Encounter (Signed)
Patient called and change apt time on 11/06/12 due to son doctor's visit.  She change time from 9:30am to 11:15am

## 2012-11-06 ENCOUNTER — Other Ambulatory Visit: Payer: Managed Care, Other (non HMO) | Admitting: Lab

## 2012-11-06 ENCOUNTER — Other Ambulatory Visit (HOSPITAL_BASED_OUTPATIENT_CLINIC_OR_DEPARTMENT_OTHER): Payer: Managed Care, Other (non HMO) | Admitting: Lab

## 2012-11-06 ENCOUNTER — Ambulatory Visit (HOSPITAL_BASED_OUTPATIENT_CLINIC_OR_DEPARTMENT_OTHER): Payer: Managed Care, Other (non HMO) | Admitting: Hematology & Oncology

## 2012-11-06 ENCOUNTER — Ambulatory Visit: Payer: Managed Care, Other (non HMO) | Admitting: Hematology & Oncology

## 2012-11-06 VITALS — BP 124/54 | HR 84 | Temp 97.8°F | Resp 16 | Ht 64.0 in | Wt 275.0 lb

## 2012-11-06 DIAGNOSIS — D259 Leiomyoma of uterus, unspecified: Secondary | ICD-10-CM

## 2012-11-06 DIAGNOSIS — D563 Thalassemia minor: Secondary | ICD-10-CM

## 2012-11-06 DIAGNOSIS — D509 Iron deficiency anemia, unspecified: Secondary | ICD-10-CM

## 2012-11-06 LAB — CBC WITH DIFFERENTIAL (CANCER CENTER ONLY)
BASO#: 0 10*3/uL (ref 0.0–0.2)
BASO%: 0.2 % (ref 0.0–2.0)
EOS%: 2.1 % (ref 0.0–7.0)
Eosinophils Absolute: 0.2 10*3/uL (ref 0.0–0.5)
HCT: 29 % — ABNORMAL LOW (ref 34.8–46.6)
HGB: 9.1 g/dL — ABNORMAL LOW (ref 11.6–15.9)
LYMPH#: 3.8 10*3/uL — ABNORMAL HIGH (ref 0.9–3.3)
LYMPH%: 44.8 % (ref 14.0–48.0)
MCH: 18 pg — ABNORMAL LOW (ref 26.0–34.0)
MCHC: 31.4 g/dL — ABNORMAL LOW (ref 32.0–36.0)
MCV: 57 fL — ABNORMAL LOW (ref 81–101)
MONO#: 0.5 10*3/uL (ref 0.1–0.9)
MONO%: 6.1 % (ref 0.0–13.0)
NEUT#: 4 10*3/uL (ref 1.5–6.5)
NEUT%: 46.8 % (ref 39.6–80.0)
Platelets: 307 10*3/uL (ref 145–400)
RBC: 5.05 10*6/uL (ref 3.70–5.32)
RDW: 17 % — ABNORMAL HIGH (ref 11.1–15.7)
WBC: 8.6 10*3/uL (ref 3.9–10.0)

## 2012-11-06 LAB — CHCC SATELLITE - SMEAR

## 2012-11-06 LAB — TECHNOLOGIST REVIEW CHCC SATELLITE

## 2012-11-06 NOTE — Progress Notes (Signed)
This office note has been dictated.

## 2012-11-06 NOTE — Progress Notes (Signed)
CC:   Arlyce Harman, MD  DIAGNOSES: 1. Iron-deficiency anemia. 2. Beta thalassemia minor.  CURRENT THERAPY:  IV iron-patient last received back on 04/10/2012.  INTERIM HISTORY:  Ms. Depaz comes in for her followup.  She has gained quite a bit of weight.  She is now back on Depakote.  Her baby is doing well.  He is 64 months old now.  He needs to have tubes put in his ears.  She still has her uterine fibroids.  Her menstrual cycles are typically fairly heavy.  When I saw her back in January, her ferritin was 404 with an iron saturation of 21%.  When we checked her hemoglobin electrophoresis back at that time, her hemoglobin A2 level was 5% with hemoglobin A of 93%.  She does chew ice.  So loves to eat ice.  She has had no bleeding otherwise, outside of the monthly cycles.  PHYSICAL EXAMINATION:  General:  This is a somewhat obese [name] in no obvious distress.  Vital signs:  Temperature of 97.8, pulse 84, respiratory rate 16, blood pressure 124/54.  Weight is 275.  Head and neck:  Normocephalic, atraumatic skull.  There are no ocular or oral lesions.  There are no palpable cervical or supraclavicular lymph nodes.  Lungs:  Clear bilaterally.  Cardiac: Regular rate and rhythm with a normal S1 and S2.  There are no murmurs, rubs, or bruits.  Abdomen:  Soft with good bowel sounds.  There is no palpable abdominal mass.  There is no fluid wave.  No palpable hepatosplenomegaly is noted.  Extremities:  No clubbing, cyanosis, or edema.  Neurological:  No focal neurological deficits.  LABORATORY STUDIES:  White cell count is 8.6, hemoglobin 9.1, hematocrit 29, platelet count 307.  MCV is 57.  IMPRESSION:  Ms. Wellnitz is a very charming 32 year old [name] with thalassemia minor.  She has iron-deficiency anemia.  I suspect we probably will need to give her some IV iron.  We will check her iron studies.  I will plan to get her back in about  6 weeks.    ______________________________ [name], M.D. PRE/MEDQ  D:  11/06/2012  T:  11/06/2012  Job:  [id number]

## 2012-11-08 LAB — RETICULOCYTES (CHCC)
ABS Retic: 174.2 10*3/uL (ref 19.0–186.0)
RBC.: 5.28 MIL/uL — ABNORMAL HIGH (ref 3.87–5.11)
Retic Ct Pct: 3.3 % — ABNORMAL HIGH (ref 0.4–2.3)

## 2012-11-08 LAB — HEMOGLOBINOPATHY EVALUATION
Hemoglobin Other: 0 %
Hgb A2 Quant: 5 % — ABNORMAL HIGH (ref 2.2–3.2)
Hgb A: 93.1 % — ABNORMAL LOW (ref 96.8–97.8)
Hgb F Quant: 1.9 % (ref 0.0–2.0)
Hgb S Quant: 0 %

## 2012-11-08 LAB — IRON AND TIBC
%SAT: 29 % (ref 20–55)
Iron: 99 ug/dL (ref 42–145)
TIBC: 342 ug/dL (ref 250–470)
UIBC: 243 ug/dL (ref 125–400)

## 2012-11-08 LAB — FERRITIN: Ferritin: 253 ng/mL (ref 10–291)

## 2012-12-15 ENCOUNTER — Telehealth: Payer: Self-pay | Admitting: Hematology & Oncology

## 2012-12-15 NOTE — Telephone Encounter (Signed)
Left message moved time of 5-5 appointment

## 2012-12-18 ENCOUNTER — Other Ambulatory Visit (HOSPITAL_BASED_OUTPATIENT_CLINIC_OR_DEPARTMENT_OTHER): Payer: Managed Care, Other (non HMO) | Admitting: Lab

## 2012-12-18 ENCOUNTER — Ambulatory Visit: Payer: Managed Care, Other (non HMO) | Admitting: Hematology & Oncology

## 2012-12-18 ENCOUNTER — Other Ambulatory Visit: Payer: Managed Care, Other (non HMO) | Admitting: Lab

## 2012-12-18 ENCOUNTER — Ambulatory Visit (HOSPITAL_BASED_OUTPATIENT_CLINIC_OR_DEPARTMENT_OTHER): Payer: Managed Care, Other (non HMO) | Admitting: Hematology & Oncology

## 2012-12-18 VITALS — BP 120/60 | HR 92 | Temp 98.3°F | Resp 16 | Ht 64.0 in | Wt 273.0 lb

## 2012-12-18 DIAGNOSIS — D509 Iron deficiency anemia, unspecified: Secondary | ICD-10-CM

## 2012-12-18 DIAGNOSIS — D563 Thalassemia minor: Secondary | ICD-10-CM

## 2012-12-18 LAB — IRON AND TIBC
%SAT: 34 % (ref 20–55)
Iron: 120 ug/dL (ref 42–145)
TIBC: 354 ug/dL (ref 250–470)
UIBC: 234 ug/dL (ref 125–400)

## 2012-12-18 LAB — CBC WITH DIFFERENTIAL (CANCER CENTER ONLY)
BASO#: 0 10*3/uL (ref 0.0–0.2)
BASO%: 0.3 % (ref 0.0–2.0)
EOS%: 1.7 % (ref 0.0–7.0)
Eosinophils Absolute: 0.1 10*3/uL (ref 0.0–0.5)
HCT: 30.7 % — ABNORMAL LOW (ref 34.8–46.6)
HGB: 9.6 g/dL — ABNORMAL LOW (ref 11.6–15.9)
LYMPH#: 3.2 10*3/uL (ref 0.9–3.3)
LYMPH%: 42.4 % (ref 14.0–48.0)
MCH: 17.8 pg — ABNORMAL LOW (ref 26.0–34.0)
MCHC: 31.3 g/dL — ABNORMAL LOW (ref 32.0–36.0)
MCV: 57 fL — ABNORMAL LOW (ref 81–101)
MONO#: 0.4 10*3/uL (ref 0.1–0.9)
MONO%: 5.8 % (ref 0.0–13.0)
NEUT#: 3.8 10*3/uL (ref 1.5–6.5)
NEUT%: 49.8 % (ref 39.6–80.0)
Platelets: 243 10*3/uL (ref 145–400)
RBC: 5.38 10*6/uL — ABNORMAL HIGH (ref 3.70–5.32)
RDW: 17.7 % — ABNORMAL HIGH (ref 11.1–15.7)
WBC: 7.6 10*3/uL (ref 3.9–10.0)

## 2012-12-18 LAB — FERRITIN: Ferritin: 318 ng/mL — ABNORMAL HIGH (ref 10–291)

## 2012-12-18 LAB — RETICULOCYTES (CHCC)
ABS Retic: 167.7 10*3/uL (ref 19.0–186.0)
RBC.: 5.41 MIL/uL — ABNORMAL HIGH (ref 3.87–5.11)
Retic Ct Pct: 3.1 % — ABNORMAL HIGH (ref 0.4–2.3)

## 2012-12-18 LAB — CHCC SATELLITE - SMEAR

## 2012-12-18 NOTE — Progress Notes (Signed)
This office note has been dictated.

## 2012-12-19 NOTE — Progress Notes (Signed)
CC:   Abigail Harman, MD  DIAGNOSES: 1. Iron deficiency anemia. 2. Beta thalassemia minor.  CURRENT THERAPY:  IV iron as indicated.  INTERIM HISTORY:  Abigail Hall comes in for followup.  She is doing quite well.  She continues on Depakote.  Thankfully, she has not had any seizures while on this.  When we last saw her in March, her ferritin was 253 with an iron saturation of 29%.  The patient does feel tired.  She does crave ice.  I suspect that she likely is iron deficient.  We are going to set her up with some IV iron this week.  She has had no problems with bleeding or bruising.  She does have her cycles but they do not appear to be too heavy.  PHYSICAL EXAMINATION:  General:  This is a well-developed, well- nourished [name] in no obvious distress.  Vital signs: Temperature of 98.3, pulse 92, respiratory rate 16, blood pressure 120/60.  Weight is 273.  Head and neck exam:  Normocephalic, atraumatic skull.  There are no ocular or oral lesions.  There are no palpable cervical or supraclavicular lymph nodes.  Lungs:  Clear bilaterally. Cardiac:  Regular rate and rhythm with a normal S1 and S2.  There are no murmurs, rubs or bruits.  Abdominal exam:  Soft with good bowel sounds. There is no palpable abdominal mass.  There is no palpable hepatosplenomegaly.  Extremities:  Show no clubbing, cyanosis or edema. Neurological exam:  Shows no focal neurological deficits.  LABORATORY STUDIES:  White cell count 7.6, hemoglobin 9.6, hematocrit 30.7, platelet count 243.  IMPRESSION:  Abigail Hall is a 32 year old [name] with both iron-deficiency anemia and beta thalassemia minor.  Of note, when we last checked her hemoglobin electrophoresis back in March, she had 5% hemoglobin A2.  We will go ahead and get her set up with IV iron this week.  We will get this all set up for Thursday.  I want to see her back in about 6 weeks' time which we should be  able to see a nice bump in her hemoglobin.   ______________________________ [name], M.D. PRE/MEDQ  D:  12/18/2012  T:  12/19/2012  Job:  [id number]

## 2012-12-22 ENCOUNTER — Ambulatory Visit (HOSPITAL_BASED_OUTPATIENT_CLINIC_OR_DEPARTMENT_OTHER): Payer: Managed Care, Other (non HMO)

## 2012-12-22 VITALS — BP 114/64 | HR 80 | Temp 97.6°F

## 2012-12-22 DIAGNOSIS — D509 Iron deficiency anemia, unspecified: Secondary | ICD-10-CM

## 2012-12-22 MED ORDER — SODIUM CHLORIDE 0.9 % IV SOLN
1020.0000 mg | Freq: Once | INTRAVENOUS | Status: AC
  Administered 2012-12-22: 1020 mg via INTRAVENOUS
  Filled 2012-12-22: qty 34

## 2012-12-22 MED ORDER — SODIUM CHLORIDE 0.9 % IV SOLN
Freq: Once | INTRAVENOUS | Status: AC
  Administered 2012-12-22: 13:00:00 via INTRAVENOUS

## 2012-12-22 NOTE — Patient Instructions (Addendum)
Ferumoxytol injection What is this medicine? FERUMOXYTOL is an iron complex. Iron is used to make healthy red blood cells, which carry oxygen and nutrients throughout the body. This medicine is used to treat iron deficiency anemia in people with chronic kidney disease. This medicine may be used for other purposes; ask your health care provider or pharmacist if you have questions. What should I tell my health care provider before I take this medicine? They need to know if you have any of these conditions: -anemia not caused by low iron levels -high levels of iron in the blood -magnetic resonance imaging (MRI) test scheduled -an unusual or allergic reaction to iron, other medicines, foods, dyes, or preservatives -pregnant or trying to get pregnant -breast-feeding How should I use this medicine? This medicine is for infusion into a vein. It is given by a health care professional in a hospital or clinic setting. Talk to your pediatrician regarding the use of this medicine in children. Special care may be needed. Overdosage: If you think you've taken too much of this medicine contact a poison control center or emergency room at once. Overdosage: If you think you have taken too much of this medicine contact a poison control center or emergency room at once. NOTE: This medicine is only for you. Do not share this medicine with others. What if I miss a dose? It is important not to miss your dose. Call your doctor or health care professional if you are unable to keep an appointment. What may interact with this medicine? This medicine may interact with the following medications: -other iron products This list may not describe all possible interactions. Give your health care provider a list of all the medicines, herbs, non-prescription drugs, or dietary supplements you use. Also tell them if you smoke, drink alcohol, or use illegal drugs. Some items may interact with your medicine. What should I watch  for while using this medicine? Visit your doctor or healthcare professional regularly. Tell your doctor or healthcare professional if your symptoms do not start to get better or if they get worse. You may need blood work done while you are taking this medicine. You may need to follow a special diet. Talk to your doctor. Foods that contain iron include: whole grains/cereals, dried fruits, beans, or peas, leafy green vegetables, and organ meats (liver, kidney). What side effects may I notice from receiving this medicine? Side effects that you should report to your doctor or health care professional as soon as possible: -allergic reactions like skin rash, itching or hives, swelling of the face, lips, or tongue -breathing problems -changes in blood pressure -feeling faint or lightheaded, falls -fever or chills -flushing, sweating, or hot feelings -swelling of the ankles or feet Side effects that usually do not require medical attention (Report these to your doctor or health care professional if they continue or are bothersome.): -diarrhea -headache -nausea, vomiting -stomach pain This list may not describe all possible side effects. Call your doctor for medical advice about side effects. You may report side effects to FDA at 1-800-FDA-1088. Where should I keep my medicine? This drug is given in a hospital or clinic and will not be stored at home. NOTE: This sheet is a summary. It may not cover all possible information. If you have questions about this medicine, talk to your doctor, pharmacist, or health care provider.  2012, [employer]. (04/24/2008 9:48:25 PM) 

## 2013-01-31 ENCOUNTER — Other Ambulatory Visit: Payer: Self-pay | Admitting: Lab

## 2013-01-31 ENCOUNTER — Telehealth: Payer: Self-pay | Admitting: Hematology & Oncology

## 2013-01-31 ENCOUNTER — Ambulatory Visit: Payer: Self-pay | Admitting: Hematology & Oncology

## 2013-01-31 NOTE — Telephone Encounter (Signed)
Pt made 6-19 from 6-18 no show

## 2013-02-01 ENCOUNTER — Ambulatory Visit (HOSPITAL_BASED_OUTPATIENT_CLINIC_OR_DEPARTMENT_OTHER): Payer: Managed Care, Other (non HMO) | Admitting: Medical

## 2013-02-01 ENCOUNTER — Other Ambulatory Visit: Payer: Self-pay | Admitting: Lab

## 2013-02-01 ENCOUNTER — Other Ambulatory Visit (HOSPITAL_BASED_OUTPATIENT_CLINIC_OR_DEPARTMENT_OTHER): Payer: Managed Care, Other (non HMO) | Admitting: Lab

## 2013-02-01 ENCOUNTER — Ambulatory Visit: Payer: Self-pay | Admitting: Medical

## 2013-02-01 VITALS — BP 126/67 | HR 97 | Temp 98.3°F | Resp 16 | Ht 64.0 in | Wt 274.0 lb

## 2013-02-01 DIAGNOSIS — D509 Iron deficiency anemia, unspecified: Secondary | ICD-10-CM

## 2013-02-01 DIAGNOSIS — D563 Thalassemia minor: Secondary | ICD-10-CM

## 2013-02-01 LAB — CBC WITH DIFFERENTIAL (CANCER CENTER ONLY)
BASO#: 0 10*3/uL (ref 0.0–0.2)
BASO%: 0.3 % (ref 0.0–2.0)
EOS%: 1.8 % (ref 0.0–7.0)
Eosinophils Absolute: 0.1 10*3/uL (ref 0.0–0.5)
HCT: 29.9 % — ABNORMAL LOW (ref 34.8–46.6)
HGB: 9.5 g/dL — ABNORMAL LOW (ref 11.6–15.9)
LYMPH#: 1.9 10*3/uL (ref 0.9–3.3)
LYMPH%: 29.7 % (ref 14.0–48.0)
MCH: 18.4 pg — ABNORMAL LOW (ref 26.0–34.0)
MCHC: 31.8 g/dL — ABNORMAL LOW (ref 32.0–36.0)
MCV: 58 fL — ABNORMAL LOW (ref 81–101)
MONO#: 0.5 10*3/uL (ref 0.1–0.9)
MONO%: 7.7 % (ref 0.0–13.0)
NEUT#: 3.8 10*3/uL (ref 1.5–6.5)
NEUT%: 60.5 % (ref 39.6–80.0)
Platelets: 312 10*3/uL (ref 145–400)
RBC: 5.16 10*6/uL (ref 3.70–5.32)
RDW: 18.6 % — ABNORMAL HIGH (ref 11.1–15.7)
WBC: 6.3 10*3/uL (ref 3.9–10.0)

## 2013-02-01 LAB — RETICULOCYTES (CHCC)
ABS Retic: 147.8 10*3/uL (ref 19.0–186.0)
RBC.: 5.28 MIL/uL — ABNORMAL HIGH (ref 3.87–5.11)
Retic Ct Pct: 2.8 % — ABNORMAL HIGH (ref 0.4–2.3)

## 2013-02-01 LAB — CHCC SATELLITE - SMEAR

## 2013-02-01 LAB — IRON AND TIBC
%SAT: 24 % (ref 20–55)
Iron: 73 ug/dL (ref 42–145)
TIBC: 306 ug/dL (ref 250–470)
UIBC: 233 ug/dL (ref 125–400)

## 2013-02-01 LAB — FERRITIN: Ferritin: 943 ng/mL — ABNORMAL HIGH (ref 10–291)

## 2013-02-01 NOTE — Progress Notes (Signed)
DIAGNOSES: 1. Iron deficiency anemia. 2. Beta thalassemia minor.  CURRENT THERAPY:  IV iron as indicated.  INTERIM HISTORY: Abigail Hall presents today for an office followup visit.  She continues on Depakote.  She is doing quite well.  She's not had any seizures while on the Depakote.  She does received IV iron back in may.  Her last iron studies revealed an iron of 120 with 34% saturation.  Her ferritin was 318.  She does have beta thalassemia minor.  When we last checked her hemoglobin electrophoresis back in March, she had 5% hemoglobin A2.  She still continues to report extremely heavy menstrual cycles.  She also has fibroids.  She is following up with her GYN.  She reports a decent appetite she denies any nausea, vomiting, diarrhea or constipation.  She denies any fevers, chills or night sweats.  She denies any palpable adenopathy.  She denies any chest pain, shortness of breath or cough.  She denies any abdominal pain.  She denies any obvious or abnormal bleeding.  She denies any lower leg swelling.  She denies any headaches, visual changes or rashes.  Review of Systems: Constitutional:Negative for malaise/fatigue, fever, chills, weight loss, diaphoresis, activity change, appetite change, and unexpected weight change.  HEENT: Negative for double vision, blurred vision, visual loss, ear pain, tinnitus, congestion, rhinorrhea, epistaxis sore throat or sinus disease, oral pain/lesion, tongue soreness Respiratory: Negative for cough, chest tightness, shortness of breath, wheezing and stridor.  Cardiovascular: Negative for chest pain, palpitations, leg swelling, orthopnea, PND, DOE or claudication Gastrointestinal: Negative for nausea, vomiting, abdominal pain, diarrhea, constipation, blood in stool, melena, hematochezia, abdominal distention, anal bleeding, rectal pain, anorexia and hematemesis.  Genitourinary: Negative for dysuria, frequency, hematuria,  Musculoskeletal: Negative for myalgias,  back pain, joint swelling, arthralgias and gait problem.  Skin: Negative for rash, color change, pallor and wound.  Neurological:. Negative for dizziness/light-headedness, tremors, seizures, syncope, facial asymmetry, speech difficulty, weakness, numbness, headaches and paresthesias.  Hematological: Negative for adenopathy. Does not bruise/bleed easily.  Psychiatric/Behavioral:  Negative for depression, no loss of interest in normal activity or change in sleep pattern.   Physical Exam: This is a pleasant moderately obese 32 year old [name] in no obvious distress Vitals: Temperature 98.3 degrees pulse 97 respirations 16 blood pressure 126/67 weight 274 pounds HEENT reveals a normocephalic, atraumatic skull, no scleral icterus, no oral lesions  Neck is supple without any cervical or supraclavicular adenopathy.  Lungs are clear to auscultation bilaterally. There are no wheezes, rales or rhonci Cardiac is regular rate and rhythm with a normal S1 and S2. There are no murmurs, rubs, or bruits.  Abdomen is soft with good bowel sounds, there is no palpable mass. There is no palpable hepatosplenomegaly. There is no palpable fluid wave.  Musculoskeletal no tenderness of the spine, ribs, or hips.  Extremities there are no clubbing, cyanosis, or edema.  Skin no petechia, purpura or ecchymosis Neurologic is nonfocal.  Laboratory Data: White count 6.3 hemoglobin 9.5 hematocrit 29.9 MCV 58 lately as 312,000  Current Outpatient Prescriptions on File Prior to Visit  Medication Sig Dispense Refill  . divalproex (DEPAKOTE) 500 MG DR tablet Take 500 mg by mouth 2 (two) times daily.      . fluticasone (FLONASE) 50 MCG/ACT nasal spray       . HYDROcodone-acetaminophen (NORCO/VICODIN) 5-325 MG per tablet Take 1 tablet by mouth as needed.       Marland Kitchen ibuprofen (ADVIL,MOTRIN) 800 MG tablet Take 400 mg by mouth every 8 (eight)  hours as needed.       Marland Kitchen oxyCODONE-acetaminophen (PERCOCET/ROXICET) 5-325 MG  per tablet 1 tablet. As needed only      . Prenatal Vit-Fe Fumarate-FA (PRENATAL MULTIVITAMIN) TABS Take 1 tablet by mouth at bedtime.       Marland Kitchen zolpidem (AMBIEN) 5 MG tablet Take 2 tablets (10 mg total) by mouth at bedtime as needed. As needed for insomnia  30 tablet  0   No current facility-administered medications on file prior to visit.   Assessment/Plan: This is a 32 year old Afro-American female with the following issues:   #1.  Intermittent iron deficiency anemia.  The last time she received IV iron was back in may.  We are rechecking an iron panel on her today.  #2.  Beta thalassemia minor.  We continue to monitor her with hemoglobin electrophoresis.  #3.  Followup.  We will follow back up with Abigail Hall in 6 weeks but before then should there be questions or concerns.

## 2013-02-13 DIAGNOSIS — Z9289 Personal history of other medical treatment: Secondary | ICD-10-CM

## 2013-02-13 HISTORY — DX: Personal history of other medical treatment: Z92.89

## 2013-02-14 ENCOUNTER — Telehealth: Payer: Self-pay | Admitting: *Deleted

## 2013-02-14 NOTE — Telephone Encounter (Signed)
Message copied by Anselm Jungling on Wed Feb 14, 2013 12:28 PM ------      Message from: Arlan Organ R      Created: Wed Feb 14, 2013  7:31 AM       Call - iron is ok!!  Consulting civil engineer ------

## 2013-02-14 NOTE — Telephone Encounter (Signed)
Called patient to let her know that her iron levels are good per d.r ennever 

## 2013-02-26 ENCOUNTER — Encounter (HOSPITAL_BASED_OUTPATIENT_CLINIC_OR_DEPARTMENT_OTHER): Payer: Self-pay | Admitting: *Deleted

## 2013-02-26 ENCOUNTER — Emergency Department (HOSPITAL_BASED_OUTPATIENT_CLINIC_OR_DEPARTMENT_OTHER): Payer: Managed Care, Other (non HMO)

## 2013-02-26 ENCOUNTER — Emergency Department (HOSPITAL_BASED_OUTPATIENT_CLINIC_OR_DEPARTMENT_OTHER)
Admission: EM | Admit: 2013-02-26 | Discharge: 2013-02-27 | Disposition: A | Discharge status: Home / Self Care | Payer: Managed Care, Other (non HMO) | Attending: Emergency Medicine | Admitting: Emergency Medicine

## 2013-02-26 DIAGNOSIS — Z8679 Personal history of other diseases of the circulatory system: Secondary | ICD-10-CM | POA: Insufficient documentation

## 2013-02-26 DIAGNOSIS — M2569 Stiffness of other specified joint, not elsewhere classified: Secondary | ICD-10-CM | POA: Insufficient documentation

## 2013-02-26 DIAGNOSIS — N12 Tubulo-interstitial nephritis, not specified as acute or chronic: Secondary | ICD-10-CM | POA: Insufficient documentation

## 2013-02-26 DIAGNOSIS — A419 Sepsis, unspecified organism: Secondary | ICD-10-CM | POA: Insufficient documentation

## 2013-02-26 DIAGNOSIS — Z79899 Other long term (current) drug therapy: Secondary | ICD-10-CM | POA: Insufficient documentation

## 2013-02-26 DIAGNOSIS — E119 Type 2 diabetes mellitus without complications: Secondary | ICD-10-CM | POA: Insufficient documentation

## 2013-02-26 DIAGNOSIS — Z8632 Personal history of gestational diabetes: Secondary | ICD-10-CM | POA: Insufficient documentation

## 2013-02-26 DIAGNOSIS — Z3202 Encounter for pregnancy test, result negative: Secondary | ICD-10-CM | POA: Insufficient documentation

## 2013-02-26 DIAGNOSIS — R109 Unspecified abdominal pain: Secondary | ICD-10-CM | POA: Insufficient documentation

## 2013-02-26 DIAGNOSIS — R112 Nausea with vomiting, unspecified: Secondary | ICD-10-CM | POA: Insufficient documentation

## 2013-02-26 DIAGNOSIS — G40909 Epilepsy, unspecified, not intractable, without status epilepticus: Secondary | ICD-10-CM | POA: Insufficient documentation

## 2013-02-26 DIAGNOSIS — Z862 Personal history of diseases of the blood and blood-forming organs and certain disorders involving the immune mechanism: Secondary | ICD-10-CM | POA: Insufficient documentation

## 2013-02-26 DIAGNOSIS — Z8719 Personal history of other diseases of the digestive system: Secondary | ICD-10-CM | POA: Insufficient documentation

## 2013-02-26 DIAGNOSIS — Z91040 Latex allergy status: Secondary | ICD-10-CM | POA: Insufficient documentation

## 2013-02-26 LAB — URINALYSIS, ROUTINE W REFLEX MICROSCOPIC
Glucose, UA: NEGATIVE mg/dL
Ketones, ur: 15 mg/dL — AB
Nitrite: POSITIVE — AB
Protein, ur: >300 mg/dL — AB
Specific Gravity, Urine: 1.02 (ref 1.005–1.030)
Urobilinogen, UA: 1 mg/dL (ref 0.0–1.0)
pH: 6 (ref 5.0–8.0)

## 2013-02-26 LAB — URINE MICROSCOPIC-ADD ON

## 2013-02-26 LAB — PREGNANCY, URINE: Preg Test, Ur: NEGATIVE

## 2013-02-26 MED ORDER — SODIUM CHLORIDE 0.9 % IV BOLUS (SEPSIS)
2000.0000 mL | Freq: Once | INTRAVENOUS | Status: AC
  Administered 2013-02-26: 2000 mL via INTRAVENOUS

## 2013-02-26 NOTE — ED Notes (Signed)
C/o vomiting, fever, generalized body aches since Friday. Pt denies urinary sxs.

## 2013-02-26 NOTE — ED Provider Notes (Addendum)
History     This chart was scribed for Derwood Kaplan, MD by Jiles Prows, ED Scribe. The patient was seen in room MH11/MH11 and the patient's care was started at 11:39 PM.  CSN: [id number] Arrival date & time 02/26/13  2321   Chief Complaint  Patient presents with  . Chills  . Fever   The history is provided by the patient and medical records. No language interpreter was used.   HPI Comments: Abigail Hall is a 32 y.o. female with a h/o epilepsy, DM, and anemia who presents to the Emergency Department complaining of fever onset Friday.  Pt complains of chills with goose bumps and chattering teeth.  She also complains of headaches, neck stiffness, and vomiting onset yesterday.  Pt reports 6 episodes of vomiting today.  Pt reports that she is coughing up phlegm that is clear after vomiting.  She reports abdominal pain.  Pt denies dysuria, headache, diaphoresis, diarrhea, weakness, SOB and any other pain.   Past Medical History  Diagnosis Date  . Hemorrhoids   . Rectal bleeding   . Constipation   . Nausea & vomiting   . Rectal pain   . Weakness   . Generalized headaches   . Anemia   . Thalassemia   . Depression   . Diabetes mellitus     gestational DM 2013 pregnancy-resolved  . Seizures     last seizure was  03/16/12  . Anemia, iron deficiency 04/10/2012   Past Surgical History  Procedure Laterality Date  . Tongue surgery    . Laparoscopy    . Carpal tunnel release  01/2009  . Wisdom tooth extraction    . Cesarean section  12/31/2011    Procedure: CESAREAN SECTION;  Surgeon: Delbert Harness, MD;  Location: [facility];  Service: Gynecology;  Laterality: N/A;  . Laparoscopic tubal ligation  03/27/2012    Procedure: LAPAROSCOPIC TUBAL LIGATION;  Surgeon: Delbert Harness, MD;  Location: [facility];  Service: Gynecology;  Laterality: Bilateral;  fallopian tubes caurtery  . Labioplasty  03/27/2012    Procedure: LABIAPLASTY;  Surgeon: Delbert Harness, MD;  Location: [facility];  Service:  Gynecology;  Laterality: N/A;  labia   Family History  Problem Relation Age of Onset  . Anesthesia problems Neg Hx   . Hypotension Neg Hx   . Malignant hyperthermia Neg Hx   . Pseudochol deficiency Neg Hx   . Diabetes Mother   . Hyperlipidemia Father   . Diabetes Maternal Grandmother   . Cancer Maternal Grandfather   . Hyperlipidemia Paternal Grandfather    History  Substance Use Topics  . Smoking status: Never Smoker   . Smokeless tobacco: Never Used  . Alcohol Use: No   OB History   Grav Para Term Preterm Abortions TAB SAB Ect Mult Living   3 1 0 1 2 1 1 0 0 1      Review of Systems  Constitutional: Positive for fever and chills.  HENT: Positive for neck stiffness.   Gastrointestinal: Positive for nausea, vomiting and abdominal pain.  Neurological: Positive for headaches.    Allergies  Latex  Home Medications   Current Outpatient Rx  Name  Route  Sig  Dispense  Refill  . divalproex (DEPAKOTE) 500 MG DR tablet   Oral   Take 500 mg by mouth 2 (two) times daily.         . fluticasone (FLONASE) 50 MCG/ACT nasal spray               .  lacosamide (VIMPAT) 200 MG TABS   Oral   Take 1,000 mg by mouth 2 (two) times daily.         Marland Kitchen zolpidem (AMBIEN) 5 MG tablet   Oral   Take 2 tablets (10 mg total) by mouth at bedtime as needed. As needed for insomnia   30 tablet   0   . HYDROcodone-acetaminophen (NORCO/VICODIN) 5-325 MG per tablet   Oral   Take 1 tablet by mouth as needed.          Marland Kitchen ibuprofen (ADVIL,MOTRIN) 800 MG tablet   Oral   Take 400 mg by mouth every 8 (eight) hours as needed.          Marland Kitchen oxyCODONE-acetaminophen (PERCOCET/ROXICET) 5-325 MG per tablet      1 tablet. As needed only         . Prenatal Vit-Fe Fumarate-FA (PRENATAL MULTIVITAMIN) TABS   Oral   Take 1 tablet by mouth at bedtime.           BP 118/77  Pulse 152  Temp(Src) 102 F (38.9 C) (Oral)  Resp 20  Ht 5\' 4"  (1.626 m)  Wt 265 lb (120.203 kg)  BMI 45.46 kg/m2   SpO2 100%  LMP 02/11/2013 Physical Exam  Nursing note and vitals reviewed. Constitutional: She is oriented to person, place, and time. She appears well-developed and well-nourished. No distress.  HENT:  Head: Normocephalic and atraumatic.  Right Ear: External ear normal.  Left Ear: External ear normal.  Posterior pharynx clear.  No exudate.  No tonsillar swelling.    Eyes: EOM are normal.  Neck: Neck supple. No tracheal deviation present.  No pre auricular or post auricular lymphadenopathy.  Ne meningeal signs.   Cardiovascular: Regular rhythm, normal heart sounds and intact distal pulses.  Exam reveals no gallop and no friction rub.   No murmur heard. Tachycardia.  Pulmonary/Chest: Effort normal and breath sounds normal. No respiratory distress. She has no wheezes. She has no rales. She exhibits no tenderness.  Abdominal: Soft. Bowel sounds are normal. There is tenderness.  Musculoskeletal: Normal range of motion.  Lymphadenopathy:    She has no cervical adenopathy.  Neurological: She is alert and oriented to person, place, and time.  Skin: Skin is warm and dry.  Psychiatric: She has a normal mood and affect. Her behavior is normal.    ED Course  Procedures (including critical care time) DIAGNOSTIC STUDIES: Oxygen Saturation is 100% on RA, normal by my interpretation.    COORDINATION OF CARE: 11:46 PM - Discussed ED treatment with pt at bedside including urinalysis, [place] or CT scan, IV fluids, and labs and pt agrees.    Labs Reviewed  URINALYSIS, ROUTINE W REFLEX MICROSCOPIC  PREGNANCY, URINE   No results found. No diagnosis found.  MDM  I personally performed the services described in this documentation, which was scribed in my presence. The recorded information has been reviewed and is accurate.  DDx includes: Pancreatitis Hepatobiliary pathology including cholecystitis Gastritis/PUD SBO ACS syndrome Aortic Dissection Colitis AAA Tumors Intra abdominal  abscess Thrombosis Mesenteric ischemia Diverticulitis Peritonitis Appendicitis Hernia Nephrolithiasis Pyelonephritis UTI/Cystitis  Pt comes in with cc of fevers, chill - extensive ROS indicative of abd pain, nausea, emesis. Abd pain is RUQ. We ordered an [place] - and there is no cholecystitis. Pt has 2 SIRS criteria on arrival - lactate is negative. She is diabetic, so we will get CT scan. UA is positive for infection, no lower quadrant pain, no flank pain -  but pyelo suspected as she has a UTI with systemic signs.    Date: 02/27/2013  Rate: 124  Rhythm: sinus tachycardia  QRS Axis: normal  Intervals: normal  ST/T Wave abnormalities: nonspecific ST/T changes  Conduction Disutrbances:none  Narrative Interpretation:   Old EKG Reviewed: none available  Pt is tachycardic, no risk factors for PE. No chest pain, dyspnea, cough, hemoptysis.  Hyperthyroidism possible.   Derwood Kaplan, MD 02/27/13 1191  Derwood Kaplan, MD 02/27/13 0215 4:39 AM Pt's CT shows pyelonephritis. PO challenge passed. HR improved to 88 on my last exam. Will discharge.  Derwood Kaplan, MD 02/27/13 [phone]

## 2013-02-26 NOTE — ED Notes (Signed)
Pt states that she last took 1000 mg tylenol around 8 pm.

## 2013-02-27 ENCOUNTER — Emergency Department (HOSPITAL_BASED_OUTPATIENT_CLINIC_OR_DEPARTMENT_OTHER): Payer: Managed Care, Other (non HMO)

## 2013-02-27 LAB — CBC WITH DIFFERENTIAL/PLATELET
Basophils Absolute: 0 10*3/uL (ref 0.0–0.1)
Basophils Relative: 0 % (ref 0–1)
Eosinophils Absolute: 0 10*3/uL (ref 0.0–0.7)
Eosinophils Relative: 0 % (ref 0–5)
HCT: 26.8 % — ABNORMAL LOW (ref 36.0–46.0)
Hemoglobin: 8.5 g/dL — ABNORMAL LOW (ref 12.0–15.0)
Lymphocytes Relative: 10 % — ABNORMAL LOW (ref 12–46)
Lymphs Abs: 1.4 10*3/uL (ref 0.7–4.0)
MCH: 18.4 pg — ABNORMAL LOW (ref 26.0–34.0)
MCHC: 31.7 g/dL (ref 30.0–36.0)
MCV: 57.9 fL — ABNORMAL LOW (ref 78.0–100.0)
Monocytes Absolute: 2.1 10*3/uL — ABNORMAL HIGH (ref 0.1–1.0)
Monocytes Relative: 15 % — ABNORMAL HIGH (ref 3–12)
Neutro Abs: 10.7 10*3/uL — ABNORMAL HIGH (ref 1.7–7.7)
Neutrophils Relative %: 75 % (ref 43–77)
Platelets: 273 10*3/uL (ref 150–400)
RBC: 4.63 MIL/uL (ref 3.87–5.11)
RDW: 17.1 % — ABNORMAL HIGH (ref 11.5–15.5)
WBC: 14.2 10*3/uL — ABNORMAL HIGH (ref 4.0–10.5)

## 2013-02-27 LAB — COMPREHENSIVE METABOLIC PANEL
ALT: 13 U/L (ref 0–35)
AST: 11 U/L (ref 0–37)
Albumin: 3.4 g/dL — ABNORMAL LOW (ref 3.5–5.2)
Alkaline Phosphatase: 77 U/L (ref 39–117)
BUN: 10 mg/dL (ref 6–23)
CO2: 23 mEq/L (ref 19–32)
Calcium: 9.9 mg/dL (ref 8.4–10.5)
Chloride: 102 mEq/L (ref 96–112)
Creatinine, Ser: 0.9 mg/dL (ref 0.50–1.10)
GFR calc Af Amer: >90 mL/min (ref >90)
GFR calc non Af Amer: 83 mL/min — ABNORMAL LOW (ref >90)
Glucose, Bld: 196 mg/dL — ABNORMAL HIGH (ref 70–99)
Potassium: 4 mEq/L (ref 3.5–5.1)
Sodium: 140 mEq/L (ref 135–145)
Total Bilirubin: 0.9 mg/dL (ref 0.3–1.2)
Total Protein: 7.9 g/dL (ref 6.0–8.3)

## 2013-02-27 LAB — LIPASE, BLOOD: Lipase: 37 U/L (ref 11–59)

## 2013-02-27 LAB — CG4 I-STAT (LACTIC ACID): Lactic Acid, Venous: 2.29 mmol/L — ABNORMAL HIGH (ref 0.5–2.2)

## 2013-02-27 MED ORDER — ACETAMINOPHEN-CODEINE #3 300-30 MG PO TABS: 1.0000 | ORAL_TABLET | Freq: Four times a day (QID) | ORAL | Status: DC | PRN

## 2013-02-27 MED ORDER — PROMETHAZINE HCL 25 MG RE SUPP: 25.0000 mg | Freq: Four times a day (QID) | RECTAL | Status: DC | PRN

## 2013-02-27 MED ORDER — IOHEXOL 300 MG/ML  SOLN
50.0000 mL | Freq: Once | INTRAMUSCULAR | Status: AC | PRN
  Administered 2013-02-27: 50 mL via ORAL

## 2013-02-27 MED ORDER — CEPHALEXIN 500 MG PO CAPS: 500.0000 mg | ORAL_CAPSULE | Freq: Two times a day (BID) | ORAL | Status: DC

## 2013-02-27 MED ORDER — IOHEXOL 300 MG/ML  SOLN
100.0000 mL | Freq: Once | INTRAMUSCULAR | Status: AC | PRN
  Administered 2013-02-27: 100 mL via INTRAVENOUS

## 2013-02-27 MED ORDER — ONDANSETRON 8 MG PO TBDP: 8.0000 mg | ORAL_TABLET | Freq: Three times a day (TID) | ORAL | Status: DC | PRN

## 2013-02-27 MED ORDER — DEXTROSE 5 % IV SOLN
1.0000 g | Freq: Once | INTRAVENOUS | Status: AC
  Administered 2013-02-27: 1 g via INTRAVENOUS
  Filled 2013-02-27: qty 10

## 2013-02-27 NOTE — ED Notes (Signed)
Patient transported to X-ray 

## 2013-02-27 NOTE — ED Provider Notes (Signed)
Pt called ED. Apparently allergy to keflex. Prescription for ciprofloxacin 500mg  BID (#14) called to [facility] on [address] per pt request.   Raeford Razor, MD 02/27/13 [phone]

## 2013-02-27 NOTE — ED Notes (Signed)
Pt given PO fluids per MD order 

## 2013-02-28 LAB — URINE CULTURE: Colony Count: >=100000

## 2013-03-01 ENCOUNTER — Encounter (HOSPITAL_BASED_OUTPATIENT_CLINIC_OR_DEPARTMENT_OTHER): Payer: Self-pay | Admitting: Emergency Medicine

## 2013-03-01 DIAGNOSIS — D509 Iron deficiency anemia, unspecified: Principal | ICD-10-CM | POA: Diagnosis present

## 2013-03-01 DIAGNOSIS — K5900 Constipation, unspecified: Secondary | ICD-10-CM | POA: Diagnosis not present

## 2013-03-01 DIAGNOSIS — N12 Tubulo-interstitial nephritis, not specified as acute or chronic: Secondary | ICD-10-CM | POA: Diagnosis present

## 2013-03-01 DIAGNOSIS — A498 Other bacterial infections of unspecified site: Secondary | ICD-10-CM | POA: Diagnosis present

## 2013-03-01 DIAGNOSIS — F3289 Other specified depressive episodes: Secondary | ICD-10-CM | POA: Diagnosis present

## 2013-03-01 DIAGNOSIS — G40909 Epilepsy, unspecified, not intractable, without status epilepticus: Secondary | ICD-10-CM | POA: Diagnosis present

## 2013-03-01 DIAGNOSIS — N920 Excessive and frequent menstruation with regular cycle: Secondary | ICD-10-CM | POA: Diagnosis present

## 2013-03-01 DIAGNOSIS — F329 Major depressive disorder, single episode, unspecified: Secondary | ICD-10-CM | POA: Diagnosis present

## 2013-03-01 DIAGNOSIS — Z79899 Other long term (current) drug therapy: Secondary | ICD-10-CM

## 2013-03-01 DIAGNOSIS — D563 Thalassemia minor: Secondary | ICD-10-CM | POA: Diagnosis present

## 2013-03-01 DIAGNOSIS — K644 Residual hemorrhoidal skin tags: Secondary | ICD-10-CM | POA: Diagnosis present

## 2013-03-01 NOTE — ED Notes (Signed)
Seen in ED on Monday for Fever/chills, N/V.  Discharged with abx for UTI.  Pt sts she is no better.  Continues to run fever.  Has HA, N/V.

## 2013-03-01 NOTE — ED Notes (Signed)
Patient treated with Cephalexin-sensitive to same-chart appended per protocol MD.

## 2013-03-02 ENCOUNTER — Inpatient Hospital Stay (HOSPITAL_COMMUNITY): Payer: Managed Care, Other (non HMO)

## 2013-03-02 ENCOUNTER — Encounter (HOSPITAL_COMMUNITY): Payer: Self-pay | Admitting: Internal Medicine

## 2013-03-02 ENCOUNTER — Inpatient Hospital Stay (HOSPITAL_BASED_OUTPATIENT_CLINIC_OR_DEPARTMENT_OTHER)
Admission: EM | Admit: 2013-03-02 | Discharge: 2013-03-04 | DRG: 812 | Disposition: A | Discharge status: Home / Self Care | Payer: Managed Care, Other (non HMO) | Attending: Internal Medicine | Admitting: Internal Medicine

## 2013-03-02 DIAGNOSIS — N12 Tubulo-interstitial nephritis, not specified as acute or chronic: Secondary | ICD-10-CM

## 2013-03-02 DIAGNOSIS — K644 Residual hemorrhoidal skin tags: Secondary | ICD-10-CM

## 2013-03-02 DIAGNOSIS — D649 Anemia, unspecified: Secondary | ICD-10-CM

## 2013-03-02 DIAGNOSIS — D563 Thalassemia minor: Secondary | ICD-10-CM | POA: Diagnosis present

## 2013-03-02 DIAGNOSIS — R509 Fever, unspecified: Secondary | ICD-10-CM

## 2013-03-02 DIAGNOSIS — D509 Iron deficiency anemia, unspecified: Secondary | ICD-10-CM

## 2013-03-02 DIAGNOSIS — R569 Unspecified convulsions: Secondary | ICD-10-CM

## 2013-03-02 DIAGNOSIS — N390 Urinary tract infection, site not specified: Secondary | ICD-10-CM

## 2013-03-02 DIAGNOSIS — D561 Beta thalassemia: Secondary | ICD-10-CM | POA: Diagnosis present

## 2013-03-02 DIAGNOSIS — O09891 Supervision of other high risk pregnancies, first trimester: Secondary | ICD-10-CM

## 2013-03-02 DIAGNOSIS — K648 Other hemorrhoids: Secondary | ICD-10-CM | POA: Diagnosis present

## 2013-03-02 LAB — CBC WITH DIFFERENTIAL/PLATELET
Basophils Absolute: 0 10*3/uL (ref 0.0–0.1)
Basophils Absolute: 0 10*3/uL (ref 0.0–0.1)
Basophils Relative: 0 % (ref 0–1)
Basophils Relative: 0 % (ref 0–1)
Eosinophils Absolute: 0.2 10*3/uL (ref 0.0–0.7)
Eosinophils Absolute: 0.2 10*3/uL (ref 0.0–0.7)
Eosinophils Relative: 2 % (ref 0–5)
Eosinophils Relative: 2 % (ref 0–5)
HCT: 22.2 % — ABNORMAL LOW (ref 36.0–46.0)
HCT: 20.8 % — ABNORMAL LOW (ref 36.0–46.0)
Hemoglobin: 7.1 g/dL — ABNORMAL LOW (ref 12.0–15.0)
Hemoglobin: 6.6 g/dL — CL (ref 12.0–15.0)
Lymphocytes Relative: 39 % (ref 12–46)
Lymphocytes Relative: 41 % (ref 12–46)
Lymphs Abs: 3.7 10*3/uL (ref 0.7–4.0)
Lymphs Abs: 3.2 10*3/uL (ref 0.7–4.0)
MCH: 18.2 pg — ABNORMAL LOW (ref 26.0–34.0)
MCH: 17.9 pg — ABNORMAL LOW (ref 26.0–34.0)
MCHC: 32 g/dL (ref 30.0–36.0)
MCHC: 31.7 g/dL (ref 30.0–36.0)
MCV: 56.8 fL — ABNORMAL LOW (ref 78.0–100.0)
MCV: 56.5 fL — ABNORMAL LOW (ref 78.0–100.0)
Monocytes Absolute: 0.9 10*3/uL (ref 0.1–1.0)
Monocytes Absolute: 0.6 10*3/uL (ref 0.1–1.0)
Monocytes Relative: 9 % (ref 3–12)
Monocytes Relative: 8 % (ref 3–12)
Neutro Abs: 4.8 10*3/uL (ref 1.7–7.7)
Neutro Abs: 3.9 10*3/uL (ref 1.7–7.7)
Neutrophils Relative %: 50 % (ref 43–77)
Neutrophils Relative %: 49 % (ref 43–77)
Platelets: 335 10*3/uL (ref 150–400)
Platelets: 303 10*3/uL (ref 150–400)
RBC: 3.91 MIL/uL (ref 3.87–5.11)
RBC: 3.68 MIL/uL — ABNORMAL LOW (ref 3.87–5.11)
RDW: 16.3 % — ABNORMAL HIGH (ref 11.5–15.5)
RDW: 16.4 % — ABNORMAL HIGH (ref 11.5–15.5)
WBC: 9.6 10*3/uL (ref 4.0–10.5)
WBC: 7.9 10*3/uL (ref 4.0–10.5)

## 2013-03-02 LAB — URINALYSIS, ROUTINE W REFLEX MICROSCOPIC
Bilirubin Urine: NEGATIVE
Glucose, UA: NEGATIVE mg/dL
Ketones, ur: NEGATIVE mg/dL
Nitrite: NEGATIVE
Protein, ur: NEGATIVE mg/dL
Specific Gravity, Urine: 1.006 (ref 1.005–1.030)
Urobilinogen, UA: 1 mg/dL (ref 0.0–1.0)
pH: 7 (ref 5.0–8.0)

## 2013-03-02 LAB — IRON AND TIBC
Iron: 47 ug/dL (ref 42–135)
Iron: 38 ug/dL — ABNORMAL LOW (ref 42–135)
Saturation Ratios: 21 % (ref 20–55)
Saturation Ratios: 16 % — ABNORMAL LOW (ref 20–55)
TIBC: 228 ug/dL — ABNORMAL LOW (ref 250–470)
TIBC: 234 ug/dL — ABNORMAL LOW (ref 250–470)
UIBC: 181 ug/dL (ref 125–400)
UIBC: 196 ug/dL (ref 125–400)

## 2013-03-02 LAB — PREPARE RBC (CROSSMATCH)

## 2013-03-02 LAB — PROTIME-INR
INR: 1.11 (ref 0.00–1.49)
Prothrombin Time: 14.1 seconds (ref 11.6–15.2)

## 2013-03-02 LAB — COMPREHENSIVE METABOLIC PANEL
ALT: 51 U/L — ABNORMAL HIGH (ref 0–35)
ALT: 43 U/L — ABNORMAL HIGH (ref 0–35)
AST: 30 U/L (ref 0–37)
AST: 22 U/L (ref 0–37)
Albumin: 3 g/dL — ABNORMAL LOW (ref 3.5–5.2)
Albumin: 2.6 g/dL — ABNORMAL LOW (ref 3.5–5.2)
Alkaline Phosphatase: 88 U/L (ref 39–117)
Alkaline Phosphatase: 73 U/L (ref 39–117)
BUN: 6 mg/dL (ref 6–23)
BUN: 6 mg/dL (ref 6–23)
CO2: 28 mEq/L (ref 19–32)
CO2: 26 mEq/L (ref 19–32)
Calcium: 9.7 mg/dL (ref 8.4–10.5)
Calcium: 9 mg/dL (ref 8.4–10.5)
Chloride: 100 mEq/L (ref 96–112)
Chloride: 101 mEq/L (ref 96–112)
Creatinine, Ser: 0.7 mg/dL (ref 0.50–1.10)
Creatinine, Ser: 0.74 mg/dL (ref 0.50–1.10)
GFR calc Af Amer: >90 mL/min (ref >90)
GFR calc Af Amer: >90 mL/min (ref >90)
GFR calc non Af Amer: >90 mL/min (ref >90)
GFR calc non Af Amer: >90 mL/min (ref >90)
Glucose, Bld: 172 mg/dL — ABNORMAL HIGH (ref 70–99)
Glucose, Bld: 207 mg/dL — ABNORMAL HIGH (ref 70–99)
Potassium: 3.9 mEq/L (ref 3.5–5.1)
Potassium: 3.4 mEq/L — ABNORMAL LOW (ref 3.5–5.1)
Sodium: 138 mEq/L (ref 135–145)
Sodium: 138 mEq/L (ref 135–145)
Total Bilirubin: 0.3 mg/dL (ref 0.3–1.2)
Total Bilirubin: 0.3 mg/dL (ref 0.3–1.2)
Total Protein: 7.3 g/dL (ref 6.0–8.3)
Total Protein: 6.7 g/dL (ref 6.0–8.3)

## 2013-03-02 LAB — URINE MICROSCOPIC-ADD ON

## 2013-03-02 LAB — VITAMIN B12: Vitamin B-12: 1277 pg/mL — ABNORMAL HIGH (ref 211–911)

## 2013-03-02 LAB — RETICULOCYTES
RBC.: 3.79 MIL/uL — ABNORMAL LOW (ref 3.87–5.11)
Retic Count, Absolute: 41.7 10*3/uL (ref 19.0–186.0)
Retic Ct Pct: 1.1 % (ref 0.4–3.1)

## 2013-03-02 LAB — ABO/RH: ABO/RH(D): O POS

## 2013-03-02 LAB — FERRITIN: Ferritin: 1065 ng/mL — ABNORMAL HIGH (ref 10–291)

## 2013-03-02 LAB — MONONUCLEOSIS SCREEN: Mono Screen: NEGATIVE

## 2013-03-02 LAB — PREGNANCY, URINE: Preg Test, Ur: NEGATIVE

## 2013-03-02 LAB — FOLATE: Folate: >20 ng/mL

## 2013-03-02 LAB — VALPROIC ACID LEVEL: Valproic Acid Lvl: 56.1 ug/mL (ref 50.0–100.0)

## 2013-03-02 MED ORDER — ACETAMINOPHEN 325 MG PO TABS
650.0000 mg | ORAL_TABLET | Freq: Four times a day (QID) | ORAL | Status: DC | PRN
  Filled 2013-03-02: qty 2

## 2013-03-02 MED ORDER — ACETAMINOPHEN 650 MG RE SUPP: 650.0000 mg | Freq: Four times a day (QID) | RECTAL | Status: DC | PRN

## 2013-03-02 MED ORDER — ZOLPIDEM TARTRATE 5 MG PO TABS: 5.0000 mg | ORAL_TABLET | Freq: Every evening | ORAL | Status: DC | PRN

## 2013-03-02 MED ORDER — OXYCODONE-ACETAMINOPHEN 5-325 MG PO TABS
1.0000 | ORAL_TABLET | ORAL | Status: DC | PRN
  Administered 2013-03-02 – 2013-03-04 (×5): 1 via ORAL
  Filled 2013-03-02 (×5): qty 1

## 2013-03-02 MED ORDER — DIVALPROEX SODIUM ER 500 MG PO TB24
500.0000 mg | ORAL_TABLET | Freq: Two times a day (BID) | ORAL | Status: DC
  Filled 2013-03-02: qty 1

## 2013-03-02 MED ORDER — ONDANSETRON 4 MG PO TBDP
4.0000 mg | ORAL_TABLET | Freq: Once | ORAL | Status: AC
  Administered 2013-03-02: 4 mg via ORAL
  Filled 2013-03-02: qty 1

## 2013-03-02 MED ORDER — SODIUM CHLORIDE 0.9 % IV SOLN
INTRAVENOUS | Status: DC
  Administered 2013-03-02: 08:00:00 via INTRAVENOUS
  Administered 2013-03-04: 20 mL/h via INTRAVENOUS

## 2013-03-02 MED ORDER — HYDROMORPHONE HCL PF 1 MG/ML IJ SOLN: 1.0000 mg | INTRAMUSCULAR | Status: DC | PRN

## 2013-03-02 MED ORDER — LACOSAMIDE 50 MG PO TABS
100.0000 mg | ORAL_TABLET | Freq: Two times a day (BID) | ORAL | Status: DC
  Administered 2013-03-02 – 2013-03-04 (×5): 100 mg via ORAL
  Filled 2013-03-02: qty 1
  Filled 2013-03-02 (×4): qty 2
  Filled 2013-03-02: qty 1

## 2013-03-02 MED ORDER — NON FORMULARY: Freq: Two times a day (BID) | Status: DC

## 2013-03-02 MED ORDER — DIVALPROEX SODIUM ER 500 MG PO TB24: 500.0000 mg | ORAL_TABLET | Freq: Two times a day (BID) | ORAL | Status: DC

## 2013-03-02 MED ORDER — ACETAMINOPHEN-CODEINE #3 300-30 MG PO TABS
1.0000 | ORAL_TABLET | Freq: Four times a day (QID) | ORAL | Status: DC | PRN
Start: 2013-03-02 — End: 2013-03-04

## 2013-03-02 MED ORDER — FLUTICASONE PROPIONATE 50 MCG/ACT NA SUSP
1.0000 | Freq: Every day | NASAL | Status: DC
  Administered 2013-03-02: 1 via NASAL
  Filled 2013-03-02: qty 16

## 2013-03-02 MED ORDER — LACOSAMIDE 200 MG PO TABS: 1000.0000 mg | ORAL_TABLET | Freq: Two times a day (BID) | ORAL | Status: DC

## 2013-03-02 MED ORDER — PRENATAL MULTIVITAMIN CH
1.0000 | ORAL_TABLET | Freq: Every day | ORAL | Status: DC
  Administered 2013-03-02 – 2013-03-03 (×2): 1 via ORAL
  Filled 2013-03-02 (×3): qty 1

## 2013-03-02 MED ORDER — OXYCODONE-ACETAMINOPHEN 5-325 MG PO TABS
2.0000 | ORAL_TABLET | Freq: Once | ORAL | Status: AC
  Administered 2013-03-02: 2 via ORAL
  Filled 2013-03-02 (×2): qty 2

## 2013-03-02 MED ORDER — ONDANSETRON HCL 4 MG/2ML IJ SOLN: 4.0000 mg | Freq: Three times a day (TID) | INTRAMUSCULAR | Status: AC | PRN

## 2013-03-02 MED ORDER — FUROSEMIDE 10 MG/ML IJ SOLN
20.0000 mg | Freq: Once | INTRAMUSCULAR | Status: AC
  Administered 2013-03-02: 20 mg via INTRAVENOUS
  Filled 2013-03-02: qty 2

## 2013-03-02 MED ORDER — DIVALPROEX SODIUM 500 MG PO DR TAB
500.0000 mg | DELAYED_RELEASE_TABLET | Freq: Two times a day (BID) | ORAL | Status: DC
  Administered 2013-03-02: 500 mg via ORAL
  Filled 2013-03-02 (×2): qty 1

## 2013-03-02 MED ORDER — DIPHENHYDRAMINE HCL 50 MG/ML IJ SOLN
25.0000 mg | Freq: Once | INTRAMUSCULAR | Status: AC
  Administered 2013-03-02: 25 mg via INTRAVENOUS
  Filled 2013-03-02: qty 1

## 2013-03-02 MED ORDER — ACETAMINOPHEN 325 MG PO TABS
650.0000 mg | ORAL_TABLET | Freq: Once | ORAL | Status: AC
  Administered 2013-03-02: 650 mg via ORAL

## 2013-03-02 MED ORDER — DIVALPROEX SODIUM ER 500 MG PO TB24
500.0000 mg | ORAL_TABLET | Freq: Two times a day (BID) | ORAL | Status: DC
  Administered 2013-03-02 – 2013-03-04 (×4): 500 mg via ORAL
  Filled 2013-03-02 (×6): qty 1

## 2013-03-02 MED ORDER — LEVOFLOXACIN IN D5W 750 MG/150ML IV SOLN
750.0000 mg | INTRAVENOUS | Status: AC
  Administered 2013-03-02 – 2013-03-03 (×2): 750 mg via INTRAVENOUS
  Filled 2013-03-02 (×3): qty 150

## 2013-03-02 NOTE — Care Management Note (Signed)
    Page 1 of 1   03/05/2013     11:16:36 AM   CARE MANAGEMENT NOTE 03/05/2013  Patient:  Abigail Hall, Abigail Hall   Account Number:  [id number]  Date Initiated:  03/02/2013  Documentation initiated by:  Letha Cape  Subjective/Objective Assessment:   dx microcytic anemia  admit- lives alone.     Action/Plan:   Anticipated DC Date:  03/04/2013   Anticipated DC Plan:  HOME/SELF CARE      DC Planning Services  CM consult      Choice offered to / List presented to:             Status of service:  Completed, signed off Medicare Important Message given?   (If response is "NO", the following Medicare IM given date fields will be blank) Date Medicare IM given:   Date Additional Medicare IM given:    Discharge Disposition:  HOME/SELF CARE  Per UR Regulation:  Reviewed for med. necessity/level of care/duration of stay  If discussed at Long Length of Stay Meetings, dates discussed:    Comments:  03/05/13 11:15 Letha Cape RN, BSN [phone]  pt dc to home, no needs anticipated.  03/02/13 15:54 Letha Cape RN, BSN [phone] patient lives alone, pta indep.  Patient with bloody dirarrhea, ID following.  NCM will continue to follow for dc needs.

## 2013-03-02 NOTE — H&P (Signed)
[facility] History and Physical  Abigail Hall ZOX:[id number] DOB: 02-Aug-1981 DOA: 03/02/2013  Referring physician: Dr Read Drivers PCP: Fortino Sic, MD   Chief Complaint: fever and headache  HPI: Abigail Hall is a 32 y.o. female  The pt has been transferred from [facility]. She presented there today with fever and headache. She has been since last one month has been having on going issues. Initially she has been treated with amoxicillin and possibly NSAID for chest pain and bronchitis. Later on she has been having tachycardia and shortness of breath and was given IV iron infusion in may and June.  She had heavy periods as well this time. She had noted occasional epistaxis but other then that she has not noted any active bleed, melena or hematochezia. She has been recently found to have E coli UTI and has been treated with [facility] outpatient Cipro 500mg  bid. Despite being on Abx she has not been feeling well. Her appetite is poor and she has been having low grade fever,chills, headaches. She initially also had some mucus secretions during her recent ER visit.  Review of Systems: as mentioned in the history of present illness. Review of the other systems are negative.  Past Medical History  Diagnosis Date  . Hemorrhoids   . Rectal bleeding   . Constipation   . Nausea & vomiting   . Rectal pain   . Weakness   . Generalized headaches   . Anemia   . Thalassemia   . Depression   . Diabetes mellitus     gestational DM 2013 pregnancy-resolved  . Seizures     last seizure was  03/16/12  . Anemia, iron deficiency 04/10/2012   Past Surgical History  Procedure Laterality Date  . Tongue surgery    . Laparoscopy    . Carpal tunnel release  01/2009  . Wisdom tooth extraction    . Cesarean section  12/31/2011    Procedure: CESAREAN SECTION;  Surgeon: Delbert Harness, MD;  Location: [facility];  Service: Gynecology;  Laterality: N/A;  . Laparoscopic tubal ligation   03/27/2012    Procedure: LAPAROSCOPIC TUBAL LIGATION;  Surgeon: Delbert Harness, MD;  Location: [facility];  Service: Gynecology;  Laterality: Bilateral;  fallopian tubes caurtery  . Labioplasty  03/27/2012    Procedure: LABIAPLASTY;  Surgeon: Delbert Harness, MD;  Location: [facility];  Service: Gynecology;  Laterality: N/A;  labia   Social History:  reports that she has never smoked. She has never used smokeless tobacco. She reports that she does not drink alcohol or use illicit drugs.  Allergies  Allergen Reactions  . Latex Itching, Swelling and Rash    Where touched.  . Keflex (Cephalexin) Hives    Family History  Problem Relation Age of Onset  . Anesthesia problems Neg Hx   . Hypotension Neg Hx   . Malignant hyperthermia Neg Hx   . Pseudochol deficiency Neg Hx   . Diabetes Mother   . Hyperlipidemia Father   . Diabetes Maternal Grandmother   . Cancer Maternal Grandfather   . Hyperlipidemia Paternal Grandfather       Prior to Admission medications   Medication Sig Start Date End Date Taking? Authorizing Provider  ciprofloxacin (CIPRO) 500 MG tablet Take 500 mg by mouth 2 (two) times daily.   Yes Historical Provider, MD  divalproex (DEPAKOTE) 500 MG DR tablet Take 500 mg by mouth 2 (two) times daily.   Yes Historical Provider, MD  ondansetron [facility]  ODT) 8 MG disintegrating tablet Take 1 tablet (8 mg total) by mouth every 8 (eight) hours as needed for nausea. 02/27/13  Yes Derwood Kaplan, MD  promethazine (PHENERGAN) 25 MG suppository Place 1 suppository (25 mg total) rectally every 6 (six) hours as needed for nausea. 02/27/13  Yes Derwood Kaplan, MD  acetaminophen-codeine (TYLENOL #3) 300-30 MG per tablet Take 1-2 tablets by mouth every 6 (six) hours as needed for pain. 02/27/13   Derwood Kaplan, MD  cephALEXin (KEFLEX) 500 MG capsule Take 1 capsule (500 mg total) by mouth 2 (two) times daily. 02/27/13   Derwood Kaplan, MD  fluticasone (FLONASE) 50 MCG/ACT nasal spray  11/18/12   Historical  Provider, MD  HYDROcodone-acetaminophen (NORCO/VICODIN) 5-325 MG per tablet Take 1 tablet by mouth as needed.  08/24/12   Historical Provider, MD  ibuprofen (ADVIL,MOTRIN) 800 MG tablet Take 400 mg by mouth every 8 (eight) hours as needed.  08/01/12   Historical Provider, MD  lacosamide (VIMPAT) 200 MG TABS Take 1,000 mg by mouth 2 (two) times daily.    Historical Provider, MD  oxyCODONE-acetaminophen (PERCOCET/ROXICET) 5-325 MG per tablet 1 tablet. As needed only 05/18/12   Historical Provider, MD  Prenatal Vit-Fe Fumarate-FA (PRENATAL MULTIVITAMIN) TABS Take 1 tablet by mouth at bedtime.     Historical Provider, MD  zolpidem (AMBIEN) 5 MG tablet Take 2 tablets (10 mg total) by mouth at bedtime as needed. As needed for insomnia 11/05/11   Delbert Harness, MD   Physical Exam: Filed Vitals:   03/01/13 2353 03/02/13 0509 03/02/13 0511  BP: 130/69  122/75  Pulse: 111  82  Temp: 100.5 F (38.1 C) 98.2 F (36.8 C)   TempSrc: Oral Oral   Resp: 20  18  Height: 5\' 4"  (1.626 m) 5\' 4"  (1.626 m)   Weight: 120.203 kg (265 lb) 123.2 kg (271 lb 9.7 oz)   SpO2: 97%  99%     General:  Alert, Awake and Oriented to Time, Place and Person   Eyes: PERRL  ENT: Pharyngeal exudates absent  Neck: negative for JVD, Carotid Bruits , Stiffness   Cardiovascular: S1 and S2 Present, Peripheral Pulses Present  Respiratory: present Air entry , Crackles basal present,   Abdomen: Bowel Sound Present, Soft and Non tender, Organomegaly  Skin: no decubitus Ulcer  Neurologic: non focal   Labs on Admission:  Basic Metabolic Panel:  Recent Labs Lab 02/26/13 2350 03/02/13 0155  NA 140 138  K 4.0 3.9  CL 102 100  CO2 23 28  GLUCOSE 196* 172*  BUN 10 6  CREATININE 0.90 0.70  CALCIUM 9.9 9.7   Liver Function Tests:  Recent Labs Lab 02/26/13 2350 03/02/13 0155  AST 11 30  ALT 13 51*  ALKPHOS 77 88  BILITOT 0.9 0.3  PROT 7.9 7.3  ALBUMIN 3.4* 3.0*    Recent Labs Lab 02/26/13 2350  LIPASE 37    No results found for this basename: AMMONIA,  in the last 168 hours CBC:  Recent Labs Lab 02/26/13 2350 03/02/13 0155  WBC 14.2* 9.6  NEUTROABS 10.7* 4.8  HGB 8.5* 7.1*  HCT 26.8* 22.2*  MCV 57.9* 56.8*  PLT 273 335   Cardiac Enzymes: No results found for this basename: CKTOTAL, CKMB, CKMBINDEX, TROPONINI,  in the last 168 hours  BNP (last 3 results) No results found for this basename: PROBNP,  in the last 8760 hours CBG: No results found for this basename: GLUCAP,  in the last 168 hours  Radiological  Exams on Admission: No results found.  Assessment/Plan Principal Problem:   Microcytic anemia Active Problems:   Beta thalassemia minor   External hemorrhoids with pain   UTI (urinary tract infection)   1. Acute on Chronic anemia She has known Iron deficiency anemia and Beta thalassemia minor. She has been getting iron transfusion. She denies getting any blood transfusion before and no complain of active bleed. She has h/o hemorrhoidal bleed. Her current anemia is likely due to iron deficiency rather then active bleed or destruction. We will repeat the Hemoglobin and iron level along with PT/INR to look for etiology of the anemia She may need blood transfusion and/or hematology consult. Occult blood testing will be done as well.  2. Fever She has been treated for UTI with appropriate antibiotics. She also had some cough and post-tussive vomiting from her last ED visit. She has some basal crackles and still remains febrile. We will repeat the blood and urine culture for her. She does not have any signs of other infection. I will change Cipro to Levofloxacin as adding a proper respiratory flouroquinolone will broad the coverage.  3. H/o seizures. We will continue her on VImpat and depakote. Levels today were satisfactory.  4. Tachycardia: Pt was initially tachycardic and was concerned about PE. But she does not have any sign of PE, no leg swelling, recent  immobilization, Shortness of breath or increased oxygen requirement. And also her tachycardia can be explained by infection. We will gently hydrate her.  DVT Prophylaxis: mechanical compression Nutrition: regular diet  Code Status: full  Family Communication: mother was present in room   Author: Lynden Oxford, MD [facility] Pager: [phone] 03/02/2013 6:34 AM    If 7PM-7AM, please contact night-coverage [email or web address] Password [email or web address]

## 2013-03-02 NOTE — ED Notes (Signed)
Pt was originally prescribed keflex 7/14, pt was allergic to keflex, cipro called in to pharmacy by staff, pt has been taking cipro since 7/15

## 2013-03-02 NOTE — Progress Notes (Addendum)
PATIENT DETAILS Name: Abigail Hall Age: 32 y.o. Sex: female Date of Birth: 30-Mar-1981 Admit Date: 03/02/2013 Admitting Physician Lynden Oxford, MD ZOX:[id number], MD  Subjective:  She says her HA and night sweats are somewhat improved, but she is still having fevers, low abdominal pain, body aches, nausea and vomiting.  No vomiting overnight  Assessment/Plan: UTI (E.Coli) Pyelonephritis -low grade fever overnight, but clinically non toxic looking.Leukocytosis seen a few days ago has resolved. -will continue with Levaquin day 2-and plan for atleast 10-14 days of treatment -still with left CVA tenderness, although mild. Recent CT Abdomen did show some perinephric stranding -given supple neck, almost 1 week hx of fever-minimal neck-doubt any meningo-encephalitis.Urine culture is positive for E Coli  Microcytic Anemia - known iron deficiency anemia and beta thalassemia minor - gets outpatient iron transfusions - no active bleeding complaints - Transfuse 2  -spoke to Dr Myna Hidalgo who will see patient tomorro.   Nausea -suspect 2/2 UTI - Zofran 4 mg Q8H PRN  Seizure Disorder -c/w Vimpat and Depakote   Beta Thalassemia Minor - monitor and transfuse/consult hematology if needed  Disposition: Remain inpatient  DVT Prophylaxis: Patient refuses foot pumps and TED hose  Code Status: Full code   Family Communication Mother in room  Procedures:  None  CONSULTS:  hematology/oncology  MEDICATIONS: Scheduled Meds: . divalproex  500 mg Oral BID  . fluticasone  1 spray Each Nare Daily  . lacosamide  1,000 mg Oral BID  . levofloxacin (LEVAQUIN) IV  750 mg Intravenous Q24H  . prenatal multivitamin  1 tablet Oral QHS   Continuous Infusions: . sodium chloride 75 mL/hr at 03/02/13 0822   PRN Meds:.acetaminophen, acetaminophen, acetaminophen-codeine, ondansetron (ZOFRAN) IV, oxyCODONE-acetaminophen, zolpidem  Antibiotics: Anti-infectives   Start     Dose/Rate  Route Frequency Ordered Stop   03/02/13 0815  levofloxacin (LEVAQUIN) IVPB 750 mg     750 mg 100 mL/hr over 90 Minutes Intravenous Every 24 hours 03/02/13 0741         PHYSICAL EXAM: Vital signs in last 24 hours: Filed Vitals:   03/02/13 0511 03/02/13 0652 03/02/13 0657 03/02/13 0702  BP: 122/75 113/69 104/66 117/75  Pulse: 82     Temp:      TempSrc:      Resp: 18     Height:      Weight:      SpO2: 99% 98% 100% 100%    Weight change:  Filed Weights   03/01/13 2353 03/02/13 0509  Weight: 120.203 kg (265 lb) 123.2 kg (271 lb 9.7 oz)   Body mass index is 46.6 kg/(m^2).   Gen Exam: Awake and alert with clear speech.  Pleasant but sleepy. Neck: Supple, No JVD.  Chest: B/L Clear.   CVS: RRR, no m/g/r Abdomen: soft, BS +, non distended, slight tenderness in lower abdomen, slight L CVA tenderness Neurologic: Non Focal.    Intake/Output from previous day: No intake or output data in the 24 hours ending 03/02/13 0845   LAB RESULTS: CBC  Recent Labs Lab 02/26/13 2350 03/02/13 0155  WBC 14.2* 9.6  HGB 8.5* 7.1*  HCT 26.8* 22.2*  PLT 273 335  MCV 57.9* 56.8*  MCH 18.4* 18.2*  MCHC 31.7 32.0  RDW 17.1* 16.3*  LYMPHSABS 1.4 3.7  MONOABS 2.1* 0.9  EOSABS 0.0 0.2  BASOSABS 0.0 0.0    Chemistries   Recent Labs Lab 02/26/13 2350 03/02/13 0155  NA 140 138  K 4.0 3.9  CL 102 100  CO2  23 28  GLUCOSE 196* 172*  BUN 10 6  CREATININE 0.90 0.70  CALCIUM 9.9 9.7    GFR Estimated Creatinine Clearance: 129.6 ml/min (by C-G formula based on Cr of 0.7).   MICROBIOLOGY: Recent Results (from the past 240 hour(s))  URINE CULTURE     Status: None   Collection Time    02/26/13 11:26 PM      Result Value Range Status   Specimen Description URINE, CLEAN CATCH   Final   Special Requests NONE   Final   Culture  Setup Time 02/27/2013 09:58   Final   Colony Count >=100,000 COLONIES/ML   Final   Culture ESCHERICHIA COLI   Final   Report Status 02/28/2013 FINAL    Final   Organism ID, Bacteria ESCHERICHIA COLI   Final    RADIOLOGY STUDIES/RESULTS: X-ray Chest Pa And Lateral   03/02/2013   *RADIOLOGY REPORT*  Clinical Data: Shortness of breath, crackles at bases, nausea, vomiting, fever  CHEST - 2 VIEW  Comparison: 02/27/2013  Findings: Normal heart size, mediastinal contours, and pulmonary vascularity. Minimal bronchitic changes. No pulmonary infiltrate, pleural effusion or pneumothorax. No acute osseous findings.  IMPRESSION: Minimal bronchitic changes.   Original Report Authenticated By: Ulyses Southward, M.D.   Dg Chest 2 View  02/27/2013   *RADIOLOGY REPORT*  Clinical Data: Nausea and vomiting; chills.  Chest pain.  CHEST - 2 VIEW  Comparison: Thoracic spine radiographs performed 07/25/2006  Findings: The lungs are well-aerated and clear.  There is no evidence of focal opacification, pleural effusion or pneumothorax.  The heart is normal in size; the mediastinal contour is within normal limits.  No acute osseous abnormalities are seen.  IMPRESSION: No acute cardiopulmonary process seen.   Original Report Authenticated By: Tonia Ghent, M.D.   US Abdomen Complete  02/27/2013   *RADIOLOGY REPORT*  Clinical Data:  Abdominal pain, vomiting and fever.  ABDOMINAL ULTRASOUND COMPLETE  Comparison:  Abdominal ultrasound performed 07/25/2012  Findings:  Gallbladder:  The gallbladder is normal in appearance, without evidence for gallstones, gallbladder wall thickening or pericholecystic fluid.  No ultrasonographic Murphy's sign is seen.  Common Bile Duct:  0.5 cm in diameter; within normal limits in caliber.  Liver:  Normal parenchymal echogenicity and echotexture; no focal lesions identified.  Limited Doppler evaluation demonstrates normal blood flow within the liver.  IVC:  Not well characterized due to overlying bowel gas.  Pancreas:  Although the pancreas is difficult to visualize due to overlying bowel gas, no focal pancreatic abnormality is identified.  Spleen:  11.7 cm  in length; within normal limits in size and echotexture.  Right kidney:  11.8 cm in length; normal in size, configuration and parenchymal echogenicity.  No evidence of mass or hydronephrosis.  Left kidney:  10.9 cm in length; normal in size, configuration and parenchymal echogenicity.  No evidence of mass or hydronephrosis.  Abdominal Aorta:  Normal in caliber; no aneurysm identified. Difficult to fully characterize due to overlying bowel gas.  IMPRESSION: Unremarkable abdominal ultrasound.   Original Report Authenticated By: Tonia Ghent, M.D.   Ct Abdomen Pelvis W Contrast  02/27/2013   *RADIOLOGY REPORT*  Clinical Data: Epigastric and right abdominal pain.  CT ABDOMEN AND PELVIS WITH CONTRAST  Technique:  Multidetector CT imaging of the abdomen and pelvis was performed following the standard protocol during bolus administration of intravenous contrast.  Contrast: 50mL OMNIPAQUE IOHEXOL 300 MG/ML  SOLN, OMNIPAQUE IOHEXOL 300 MG/ML  SOLN  Comparison: Ultrasound 02/27/2013  Findings: Lung bases  are clear.  No evidence for free air.  Normal appearance of the liver, gallbladder and portal venous system.  9 mm low density structure in the spleen is nonspecific. Normal appearance of the pancreas and adrenal glands.  There is mild perinephric edema, left side greater than right.  Left renal parenchyma is slightly heterogeneous, particularly in the upper pole region.  Findings raise concern for renal inflammation.  No evidence for kidney stones or hydronephrosis.  Urinary bladder wall appears to be thickened but the bladder is nondistended.  No significant free fluid or lymphadenopathy.  No gross abnormality to the uterus or adnexa tissues.  Normal appearance of the appendix.  Normal appearance of the small and large bowel.  No acute bony abnormality.  IMPRESSION: There is mild perinephric edema bilaterally, left side greater than right.  There is also mild heterogeneity of the left kidney.  Findings are  concerning for pyelonephritis.  In addition, the urinary bladder wall is prominent and cannot exclude cystitis.   Original Report Authenticated By: Richarda Overlie, M.D.    Gerrit Friends, PA-S  If 7PM-7AM, please contact night-coverage [email or web address] Password [email or web address] 03/02/2013, 8:45 AM   LOS: 0 days   Attending Patient seen and examined, agree with the above assessment and plan. Recently seen in the ED and discharged for Pyleonephritis, Urine culture positive for E Coli, sensitive to Cipro.Admitted after persistent fever, nausea, headaches and continued left flank pain. On exam, neck supple, lungs clear, and CVA left tender. Started on IV Levaquin-will continue and monitor clinical course-if continues to be febrile will need antibiotic to be changed and maybe further work up  [employer]

## 2013-03-02 NOTE — Progress Notes (Signed)
Pt arrived to unit via wheelchair. Pt was oriented to room and safety video has been watched. Pt is in no apparent distress and vss. MD has been paged for admission orders. Will continue to monitor pt.

## 2013-03-02 NOTE — Progress Notes (Signed)
Utilization review completed. Elih Mooney, RN, BSN. 

## 2013-03-02 NOTE — Progress Notes (Addendum)
03/02/13 calls made to interpeter for physician to explain discharge. # [phone]. Call made for SW to explain meds and Homecare, refused Homecare nurse.# [phone]

## 2013-03-02 NOTE — ED Notes (Signed)
Pt frustrated that I was asking her the same questions that were asked in triage, pt states that she was evaluated by her neurologist yesterday and was told to return to er for follow up because the nurse at the neurologist office told patient she should not be continuing to have fevers if she has been on an antibiotic for 2 days. md notified.

## 2013-03-02 NOTE — ED Provider Notes (Signed)
History    CSN: [id number] Arrival date & time 03/01/13  2343  First MD Initiated Contact with Patient 03/02/13 [phone]     Chief Complaint  Patient presents with  . Fever   (Consider location/radiation/quality/duration/timing/severity/associated sxs/prior Treatment) HPI This is a 32 year old female who was seen on July 14 for fever and multiple other symptoms and diagnosed with pyelonephritis. She had a workup that included abdominal ultrasound and abdominal/pelvic CT scan. She was given Rocephin in the ED and sent home on Keflex. The patient was subsequently switched to ciprofloxacin and do to a reported Keflex allergy. She has had 6 doses of ciprofloxacin since discharge.  She returns this morning stating she did not feel any better. She continues to have low-grade fevers. She has had nausea and vomiting although she has been able to keep fluids down most of the time. She has not been eating well. She has not had diarrhea, shortness of breath or chest pain. She feels lightheaded on standing and has had a racing heartbeat. She has a headache, worse with movement of her head. She has left upper quadrant pain radiating to her left flank and has difficulty finding a comfortable position. She has had chills and night sweats. She has not had a seizure recently though she does have a seizure disorder. She describes her symptoms as being moderate to severe  Past Medical History  Diagnosis Date  . Hemorrhoids   . Rectal bleeding   . Constipation   . Nausea & vomiting   . Rectal pain   . Weakness   . Generalized headaches   . Anemia   . Thalassemia   . Depression   . Diabetes mellitus     gestational DM 2013 pregnancy-resolved  . Seizures     last seizure was  03/16/12  . Anemia, iron deficiency 04/10/2012   Past Surgical History  Procedure Laterality Date  . Tongue surgery    . Laparoscopy    . Carpal tunnel release  01/2009  . Wisdom tooth extraction    . Cesarean section  12/31/2011    Procedure: CESAREAN SECTION;  Surgeon: Delbert Harness, MD;  Location: [facility];  Service: Gynecology;  Laterality: N/A;  . Laparoscopic tubal ligation  03/27/2012    Procedure: LAPAROSCOPIC TUBAL LIGATION;  Surgeon: Delbert Harness, MD;  Location: [facility];  Service: Gynecology;  Laterality: Bilateral;  fallopian tubes caurtery  . Labioplasty  03/27/2012    Procedure: LABIAPLASTY;  Surgeon: Delbert Harness, MD;  Location: [facility];  Service: Gynecology;  Laterality: N/A;  labia   Family History  Problem Relation Age of Onset  . Anesthesia problems Neg Hx   . Hypotension Neg Hx   . Malignant hyperthermia Neg Hx   . Pseudochol deficiency Neg Hx   . Diabetes Mother   . Hyperlipidemia Father   . Diabetes Maternal Grandmother   . Cancer Maternal Grandfather   . Hyperlipidemia Paternal Grandfather    History  Substance Use Topics  . Smoking status: Never Smoker   . Smokeless tobacco: Never Used  . Alcohol Use: No   OB History   Grav Para Term Preterm Abortions TAB SAB Ect Mult Living   3 1 0 1 2 1 1 0 0 1      Review of Systems  All other systems reviewed and are negative.    Allergies  Latex and Keflex  Home Medications   Current Outpatient Rx  Name  Route  Sig  Dispense  Refill  .  ciprofloxacin (CIPRO) 500 MG tablet   Oral   Take 500 mg by mouth 2 (two) times daily.         . divalproex (DEPAKOTE) 500 MG DR tablet   Oral   Take 500 mg by mouth 2 (two) times daily.         . ondansetron (ZOFRAN ODT) 8 MG disintegrating tablet   Oral   Take 1 tablet (8 mg total) by mouth every 8 (eight) hours as needed for nausea.   20 tablet   0   . promethazine (PHENERGAN) 25 MG suppository   Rectal   Place 1 suppository (25 mg total) rectally every 6 (six) hours as needed for nausea.   12 each   0   . acetaminophen-codeine (TYLENOL #3) 300-30 MG per tablet   Oral   Take 1-2 tablets by mouth every 6 (six) hours as needed for pain.   15 tablet   0   . cephALEXin (KEFLEX) 500 MG  capsule   Oral   Take 1 capsule (500 mg total) by mouth 2 (two) times daily.   28 capsule   0   . fluticasone (FLONASE) 50 MCG/ACT nasal spray               . HYDROcodone-acetaminophen (NORCO/VICODIN) 5-325 MG per tablet   Oral   Take 1 tablet by mouth as needed.          Marland Kitchen ibuprofen (ADVIL,MOTRIN) 800 MG tablet   Oral   Take 400 mg by mouth every 8 (eight) hours as needed.          . lacosamide (VIMPAT) 200 MG TABS   Oral   Take 1,000 mg by mouth 2 (two) times daily.         Marland Kitchen oxyCODONE-acetaminophen (PERCOCET/ROXICET) 5-325 MG per tablet      1 tablet. As needed only         . Prenatal Vit-Fe Fumarate-FA (PRENATAL MULTIVITAMIN) TABS   Oral   Take 1 tablet by mouth at bedtime.          Marland Kitchen zolpidem (AMBIEN) 5 MG tablet   Oral   Take 2 tablets (10 mg total) by mouth at bedtime as needed. As needed for insomnia   30 tablet   0    BP 130/69  Pulse 111  Temp(Src) 100.5 F (38.1 C) (Oral)  Resp 20  Ht 5\' 4"  (1.626 m)  Wt 265 lb (120.203 kg)  BMI 45.46 kg/m2  SpO2 97%  LMP 02/18/2013  Breastfeeding? No  Physical Exam General: Well-developed, well-nourished female in no acute distress; appearance consistent with age of record HENT: normocephalic, atraumatic Eyes: pupils equal round and reactive to light; extraocular muscles intact; no scleral icterus Neck: supple Heart: regular rate and rhythm; tachycardia Lungs: clear to auscultation bilaterally Abdomen: soft; nondistended; nontender; no masses or hepatosplenomegaly; bowel sounds present GU: No CVA tenderness Extremities: No deformity; full range of motion; pulses normal Neurologic: Awake, alert and oriented; motor function intact in all extremities and symmetric; no facial droop Skin: Warm and dry Psychiatric: Normal mood and affect    ED Course  Procedures (including critical care time)   MDM   Nursing notes and vitals signs, including pulse oximetry, reviewed.  Summary of this visit's  results, reviewed by myself:  Labs:  Results for orders placed during the hospital encounter of 03/02/13 (from the past 24 hour(s))  CBC WITH DIFFERENTIAL     Status: Abnormal   Collection Time  03/02/13  1:55 AM      Result Value Range   WBC 9.6  4.0 - 10.5 K/uL   RBC 3.91  3.87 - 5.11 MIL/uL   Hemoglobin 7.1 (*) 12.0 - 15.0 g/dL   HCT 21.3 (*) 08.6 - 57.8 %   MCV 56.8 (*) 78.0 - 100.0 fL   MCH 18.2 (*) 26.0 - 34.0 pg   MCHC 32.0  30.0 - 36.0 g/dL   RDW 46.9 (*) 62.9 - 52.8 %   Platelets 335  150 - 400 K/uL   Neutrophils Relative % 50  43 - 77 %   Lymphocytes Relative 39  12 - 46 %   Monocytes Relative 9  3 - 12 %   Eosinophils Relative 2  0 - 5 %   Basophils Relative 0  0 - 1 %   Neutro Abs 4.8  1.7 - 7.7 K/uL   Lymphs Abs 3.7  0.7 - 4.0 K/uL   Monocytes Absolute 0.9  0.1 - 1.0 K/uL   Eosinophils Absolute 0.2  0.0 - 0.7 K/uL   Basophils Absolute 0.0  0.0 - 0.1 K/uL   RBC Morphology POLYCHROMASIA PRESENT     WBC Morphology ATYPICAL LYMPHOCYTES     Smear Review LARGE PLATELETS PRESENT    COMPREHENSIVE METABOLIC PANEL     Status: Abnormal   Collection Time    03/02/13  1:55 AM      Result Value Range   Sodium 138  135 - 145 mEq/L   Potassium 3.9  3.5 - 5.1 mEq/L   Chloride 100  96 - 112 mEq/L   CO2 28  19 - 32 mEq/L   Glucose, Bld 172 (*) 70 - 99 mg/dL   BUN 6  6 - 23 mg/dL   Creatinine, Ser 4.13  0.50 - 1.10 mg/dL   Calcium 9.7  8.4 - 24.4 mg/dL   Total Protein 7.3  6.0 - 8.3 g/dL   Albumin 3.0 (*) 3.5 - 5.2 g/dL   AST 30  0 - 37 U/L   ALT 51 (*) 0 - 35 U/L   Alkaline Phosphatase 88  39 - 117 U/L   Total Bilirubin 0.3  0.3 - 1.2 mg/dL   GFR calc non Af Amer >90  >90 mL/min   GFR calc Af Amer >90  >90 mL/min  URINALYSIS, ROUTINE W REFLEX MICROSCOPIC     Status: Abnormal   Collection Time    03/02/13  1:55 AM      Result Value Range   Color, Urine YELLOW  YELLOW   APPearance CLOUDY (*) CLEAR   Specific Gravity, Urine 1.006  1.005 - 1.030   pH 7.0  5.0 - 8.0    Glucose, UA NEGATIVE  NEGATIVE mg/dL   Hgb urine dipstick TRACE (*) NEGATIVE   Bilirubin Urine NEGATIVE  NEGATIVE   Ketones, ur NEGATIVE  NEGATIVE mg/dL   Protein, ur NEGATIVE  NEGATIVE mg/dL   Urobilinogen, UA 1.0  0.0 - 1.0 mg/dL   Nitrite NEGATIVE  NEGATIVE   Leukocytes, UA TRACE (*) NEGATIVE  PREGNANCY, URINE     Status: None   Collection Time    03/02/13  1:55 AM      Result Value Range   Preg Test, Ur NEGATIVE  NEGATIVE  MONONUCLEOSIS SCREEN     Status: None   Collection Time    03/02/13  1:55 AM      Result Value Range   Mono Screen NEGATIVE  NEGATIVE  URINE MICROSCOPIC-ADD ON  Status: Abnormal   Collection Time    03/02/13  1:55 AM      Result Value Range   Squamous Epithelial / LPF MANY (*) RARE   WBC, UA 0-2  <3 WBC/hpf   RBC / HPF 0-2  <3 RBC/hpf   Bacteria, UA RARE  RARE  VALPROIC ACID LEVEL     Status: None   Collection Time    03/02/13  3:42 AM      Result Value Range   Valproic Acid Lvl 56.1  50.0 - 100.0 ug/mL   [facility] to admit for acutely worsened microcytic anemia and persistent fever.   Hanley Seamen, MD 03/02/13 [phone]

## 2013-03-02 NOTE — Progress Notes (Signed)
[facility] PROGRESS NOTE  BELVIA GOTSCHALL ZOX:[id number] DOB: [birth year] DOA: 03/02/2013 PCP: Fortino Sic, MD HPI/Subjective: Called in for admission from Dr Read Drivers for worsening of Chronic Iron def anemia with ongoing UTI due to E coli. Pt also has been febrile an tachycardic. As per the ER physician the pt is taking the Cipro regularly and there is no overt bleeding.  Objective: Filed Vitals:   03/01/13 2353  BP: 130/69  Pulse: 111  Temp: 100.5 F (38.1 C)  TempSrc: Oral  Resp: 20  Height: 5\' 4"  (1.626 m)  Weight: 120.203 kg (265 lb)  SpO2: 97%    Data Reviewed: CBC:  Recent Labs Lab 02/26/13 2350 03/02/13 0155  WBC 14.2* 9.6  NEUTROABS 10.7* 4.8  HGB 8.5* 7.1*  HCT 26.8* 22.2*  MCV 57.9* 56.8*  PLT 273 335   Active Problems: Beta thalassemia Minor Iron Def Anemia Acute on Chronic Anemia May need blood transfusion and hemoglobin monitoring. May need hematology consult if continuous to worsen Likely due to ongoing stress of infection.  UTI due to [facility] Continue CIpro since the organism is sensitive and urine analysis is clearing up. If fever does not get better identify other sources of infection.   Author: Lynden Oxford, MD [facility] Pager: [phone] 03/02/2013 3:19 AM   If 7PM-7AM, please contact night-coverage at [email or web address], password [facility]

## 2013-03-03 DIAGNOSIS — R569 Unspecified convulsions: Secondary | ICD-10-CM

## 2013-03-03 DIAGNOSIS — N12 Tubulo-interstitial nephritis, not specified as acute or chronic: Secondary | ICD-10-CM

## 2013-03-03 DIAGNOSIS — D509 Iron deficiency anemia, unspecified: Principal | ICD-10-CM

## 2013-03-03 LAB — TYPE AND SCREEN
ABO/RH(D): O POS
Antibody Screen: NEGATIVE
Unit division: 0
Unit division: 0

## 2013-03-03 LAB — CBC WITH DIFFERENTIAL/PLATELET
Basophils Absolute: 0 10*3/uL (ref 0.0–0.1)
Basophils Relative: 0 % (ref 0–1)
Eosinophils Absolute: 0.2 10*3/uL (ref 0.0–0.7)
Eosinophils Relative: 2 % (ref 0–5)
HCT: 25.9 % — ABNORMAL LOW (ref 36.0–46.0)
Hemoglobin: 8.5 g/dL — ABNORMAL LOW (ref 12.0–15.0)
Lymphocytes Relative: 34 % (ref 12–46)
Lymphs Abs: 3.3 10*3/uL (ref 0.7–4.0)
MCH: 19.9 pg — ABNORMAL LOW (ref 26.0–34.0)
MCHC: 32.8 g/dL (ref 30.0–36.0)
MCV: 60.5 fL — ABNORMAL LOW (ref 78.0–100.0)
Monocytes Absolute: 0.8 10*3/uL (ref 0.1–1.0)
Monocytes Relative: 8 % (ref 3–12)
Neutro Abs: 5.5 10*3/uL (ref 1.7–7.7)
Neutrophils Relative %: 56 % (ref 43–77)
Platelets: 334 10*3/uL (ref 150–400)
RBC: 4.28 MIL/uL (ref 3.87–5.11)
RDW: 21.3 % — ABNORMAL HIGH (ref 11.5–15.5)
WBC: 9.8 10*3/uL (ref 4.0–10.5)

## 2013-03-03 LAB — CBC
HCT: 25.3 % — ABNORMAL LOW (ref 36.0–46.0)
Hemoglobin: 8.3 g/dL — ABNORMAL LOW (ref 12.0–15.0)
MCH: 19.9 pg — ABNORMAL LOW (ref 26.0–34.0)
MCHC: 32.8 g/dL (ref 30.0–36.0)
MCV: 60.5 fL — ABNORMAL LOW (ref 78.0–100.0)
Platelets: 334 10*3/uL (ref 150–400)
RBC: 4.18 MIL/uL (ref 3.87–5.11)
RDW: 21.1 % — ABNORMAL HIGH (ref 11.5–15.5)
WBC: 9 10*3/uL (ref 4.0–10.5)

## 2013-03-03 LAB — BASIC METABOLIC PANEL
BUN: 6 mg/dL (ref 6–23)
CO2: 27 mEq/L (ref 19–32)
Calcium: 9.1 mg/dL (ref 8.4–10.5)
Chloride: 101 mEq/L (ref 96–112)
Creatinine, Ser: 0.7 mg/dL (ref 0.50–1.10)
GFR calc Af Amer: >90 mL/min (ref >90)
GFR calc non Af Amer: >90 mL/min (ref >90)
Glucose, Bld: 209 mg/dL — ABNORMAL HIGH (ref 70–99)
Potassium: 4.1 mEq/L (ref 3.5–5.1)
Sodium: 137 mEq/L (ref 135–145)

## 2013-03-03 MED ORDER — SENNA 8.6 MG PO TABS
2.0000 | ORAL_TABLET | Freq: Every day | ORAL | Status: DC
  Administered 2013-03-03 – 2013-03-04 (×2): 17.2 mg via ORAL
  Filled 2013-03-03 (×3): qty 2

## 2013-03-03 MED ORDER — FLEET ENEMA 7-19 GM/118ML RE ENEM
1.0000 | ENEMA | Freq: Every day | RECTAL | Status: DC | PRN
  Filled 2013-03-03: qty 1

## 2013-03-03 MED ORDER — SODIUM CHLORIDE 0.9 % IV SOLN
1500.0000 mg | Freq: Once | INTRAVENOUS | Status: AC
  Administered 2013-03-03: 1500 mg via INTRAVENOUS
  Filled 2013-03-03 (×2): qty 30

## 2013-03-03 MED ORDER — METHYLPREDNISOLONE SODIUM SUCC 125 MG IJ SOLR
125.0000 mg | Freq: Once | INTRAMUSCULAR | Status: AC
  Administered 2013-03-03: 125 mg via INTRAVENOUS
  Filled 2013-03-03 (×2): qty 2

## 2013-03-03 MED ORDER — POLYETHYLENE GLYCOL 3350 17 G PO PACK
17.0000 g | PACK | Freq: Two times a day (BID) | ORAL | Status: DC
  Administered 2013-03-03 – 2013-03-04 (×3): 17 g via ORAL
  Filled 2013-03-03 (×5): qty 1

## 2013-03-03 MED ORDER — LEVOFLOXACIN 750 MG PO TABS
750.0000 mg | ORAL_TABLET | Freq: Every day | ORAL | Status: DC
  Administered 2013-03-04: 750 mg via ORAL
  Filled 2013-03-03: qty 1

## 2013-03-03 MED ORDER — ONDANSETRON HCL 4 MG/2ML IJ SOLN
4.0000 mg | Freq: Four times a day (QID) | INTRAMUSCULAR | Status: DC | PRN
  Administered 2013-03-03 (×2): 4 mg via INTRAVENOUS
  Filled 2013-03-03 (×2): qty 2

## 2013-03-03 MED ORDER — ACETAMINOPHEN 325 MG PO TABS
650.0000 mg | ORAL_TABLET | Freq: Once | ORAL | Status: AC
  Administered 2013-03-03: 650 mg via ORAL
  Filled 2013-03-03: qty 2

## 2013-03-03 MED ORDER — DIPHENHYDRAMINE HCL 50 MG/ML IJ SOLN
25.0000 mg | Freq: Once | INTRAMUSCULAR | Status: AC
  Administered 2013-03-03: 25 mg via INTRAVENOUS
  Filled 2013-03-03: qty 1

## 2013-03-03 NOTE — Progress Notes (Signed)
PATIENT DETAILS Name: Abigail Hall Age: 32 y.o. Sex: female Date of Birth: Apr 21, 1981 Admit Date: 03/02/2013 Admitting Physician Lynden Oxford, MD ZOX:[id number] E, MD  Subjective: No major complaints-still with Left flank area pain-but better. She feels better than on admission  Assessment/Plan: UTI (E.Coli) Pyelonephritis  -patient was admitted and started on IV Levaquin, recent Urine cx positive for E Coli -She did low grade fever on admission, but now afebrile, clinically non toxic looking.Leukocytosis seen a few days ago has resolved.  -will continue with Levaquin day 3-and plan for atleast 10-14 days of treatment  -still with left CVA tenderness although better, although mild. Recent CT Abdomen did show some perinephric stranding   Microcytic Anemia  - known iron deficiency anemia and beta thalassemia minor  - gets outpatient iron transfusions  - no active bleeding complaints  - Transfused 2 units of PRBC on 7/18 -seen by Dr Myna Hidalgo 7/19  Nausea  -this is better-tolerating food well. -suspect 2/2 UTI  - Zofran 4 mg Q8H PRN   Constipation -start Miralax/Senokot-if no response prn Fleet enema  Seizure Disorder  -c/w Vimpat and Depakote   Beta Thalassemia Minor  - monitor  -Dr Gustavo Lah assistance appreciated  Disposition: Remain inpatient  DVT Prophylaxis:  SCD's  Code Status: Full code   Family Communication Mother at bedside  Procedures:  None  CONSULTS:  hematology/oncology   MEDICATIONS: Scheduled Meds: . divalproex  500 mg Oral BID  . fluticasone  1 spray Each Nare Daily  . iron dextran (INFED/DEXFERRUM) infusion  1,500 mg Intravenous Once  . lacosamide  100 mg Oral BID  . levofloxacin (LEVAQUIN) IV  750 mg Intravenous Q24H  . [START ON 03/04/2013] levofloxacin  750 mg Oral Daily  . polyethylene glycol  17 g Oral BID  . prenatal multivitamin  1 tablet Oral QHS  . senna  2 tablet Oral Daily   Continuous Infusions: . sodium  chloride 40 mL/hr at 03/02/13 1209   PRN Meds:.acetaminophen, acetaminophen, acetaminophen-codeine, ondansetron, oxyCODONE-acetaminophen, zolpidem  Antibiotics: Anti-infectives   Start     Dose/Rate Route Frequency Ordered Stop   03/04/13 1000  levofloxacin (LEVAQUIN) tablet 750 mg     750 mg Oral Daily 03/03/13 0845     03/02/13 0815  levofloxacin (LEVAQUIN) IVPB 750 mg     750 mg 100 mL/hr over 90 Minutes Intravenous Every 24 hours 03/02/13 0741 03/04/13 0814       PHYSICAL EXAM: Vital signs in last 24 hours: Filed Vitals:   03/02/13 1926 03/02/13 2117 03/03/13 0359 03/03/13 0503  BP: 106/66 131/85  109/64  Pulse: 73 64  74  Temp: 98.3 F (36.8 C) 98.3 F (36.8 C) 98.1 F (36.7 C) 98.9 F (37.2 C)  TempSrc: Oral Oral  Oral  Resp: 18 18  18   Height:      Weight:      SpO2:  99%  100%    Weight change:  Filed Weights   03/01/13 2353 03/02/13 0509  Weight: 120.203 kg (265 lb) 123.2 kg (271 lb 9.7 oz)   Body mass index is 46.6 kg/(m^2).   Gen Exam: Awake and alert with clear speech.   Neck: Supple, No JVD.   Chest: B/L Clear.   CVS: S1 S2 Regular, no murmurs.  Abdomen: soft, BS +, non tender, non distended. Mild CVA tenderness today compared to yesterday Extremities: no edema, lower extremities warm to touch. Neurologic: Non Focal.  Skin: No Rash.  Wounds: N/A.    Intake/Output from previous day:  Intake/Output Summary (Last 24 hours) at 03/03/13 0946 Last data filed at 03/02/13 1805  Gross per 24 hour  Intake 154.17 ml  Output      0 ml  Net 154.17 ml     LAB RESULTS: CBC  Recent Labs Lab 02/26/13 2350 03/02/13 0155 03/02/13 0951 03/02/13 2330 03/03/13 0523  WBC 14.2* 9.6 7.9 9.8 9.0  HGB 8.5* 7.1* 6.6* 8.5* 8.3*  HCT 26.8* 22.2* 20.8* 25.9* 25.3*  PLT 273 335 303 334 334  MCV 57.9* 56.8* 56.5* 60.5* 60.5*  MCH 18.4* 18.2* 17.9* 19.9* 19.9*  MCHC 31.7 32.0 31.7 32.8 32.8  RDW 17.1* 16.3* 16.4* 21.3* 21.1*  LYMPHSABS 1.4 3.7 3.2 3.3  --    MONOABS 2.1* 0.9 0.6 0.8  --   EOSABS 0.0 0.2 0.2 0.2  --   BASOSABS 0.0 0.0 0.0 0.0  --     Chemistries   Recent Labs Lab 02/26/13 2350 03/02/13 0155 03/02/13 0951 03/03/13 0523  NA 140 138 138 137  K 4.0 3.9 3.4* 4.1  CL 102 100 101 101  CO2 23 28 26 27   GLUCOSE 196* 172* 207* 209*  BUN 10 6 6 6   CREATININE 0.90 0.70 0.74 0.70  CALCIUM 9.9 9.7 9.0 9.1    CBG: No results found for this basename: GLUCAP,  in the last 168 hours  GFR Estimated Creatinine Clearance: 129.6 ml/min (by C-G formula based on Cr of 0.7).  Coagulation profile  Recent Labs Lab 03/02/13 0951  INR 1.11    Cardiac Enzymes No results found for this basename: CK, CKMB, TROPONINI, MYOGLOBIN,  in the last 168 hours  No components found with this basename: POCBNP,  No results found for this basename: DDIMER,  in the last 72 hours No results found for this basename: HGBA1C,  in the last 72 hours No results found for this basename: CHOL, HDL, LDLCALC, TRIG, CHOLHDL, LDLDIRECT,  in the last 72 hours No results found for this basename: TSH, T4TOTAL, FREET3, T3FREE, THYROIDAB,  in the last 72 hours  Recent Labs  03/02/13 0951 03/02/13 1240  VITAMINB12  --  1277*  FOLATE  --  >20.0  FERRITIN  --  1065*  TIBC 228* 234*  IRON 47 38*  RETICCTPCT  --  1.1   No results found for this basename: LIPASE, AMYLASE,  in the last 72 hours  Urine Studies No results found for this basename: UACOL, UAPR, USPG, UPH, UTP, UGL, UKET, UBIL, UHGB, UNIT, UROB, ULEU, UEPI, UWBC, URBC, UBAC, CAST, CRYS, UCOM, BILUA,  in the last 72 hours  MICROBIOLOGY: Recent Results (from the past 240 hour(s))  URINE CULTURE     Status: None   Collection Time    02/26/13 11:26 PM      Result Value Range Status   Specimen Description URINE, CLEAN CATCH   Final   Special Requests NONE   Final   Culture  Setup Time 02/27/2013 09:58   Final   Colony Count >=100,000 COLONIES/ML   Final   Culture ESCHERICHIA COLI   Final    Report Status 02/28/2013 FINAL   Final   Organism ID, Bacteria ESCHERICHIA COLI   Final    RADIOLOGY STUDIES/RESULTS: X-ray Chest Pa And Lateral   03/02/2013   *RADIOLOGY REPORT*  Clinical Data: Shortness of breath, crackles at bases, nausea, vomiting, fever  CHEST - 2 VIEW  Comparison: 02/27/2013  Findings: Normal heart size, mediastinal contours, and pulmonary vascularity. Minimal bronchitic changes. No pulmonary infiltrate, pleural effusion or  pneumothorax. No acute osseous findings.  IMPRESSION: Minimal bronchitic changes.   Original Report Authenticated By: Ulyses Southward, M.D.   Dg Chest 2 View  02/27/2013   *RADIOLOGY REPORT*  Clinical Data: Nausea and vomiting; chills.  Chest pain.  CHEST - 2 VIEW  Comparison: Thoracic spine radiographs performed 07/25/2006  Findings: The lungs are well-aerated and clear.  There is no evidence of focal opacification, pleural effusion or pneumothorax.  The heart is normal in size; the mediastinal contour is within normal limits.  No acute osseous abnormalities are seen.  IMPRESSION: No acute cardiopulmonary process seen.   Original Report Authenticated By: Tonia Ghent, M.D.   US Abdomen Complete  02/27/2013   *RADIOLOGY REPORT*  Clinical Data:  Abdominal pain, vomiting and fever.  ABDOMINAL ULTRASOUND COMPLETE  Comparison:  Abdominal ultrasound performed 07/25/2012  Findings:  Gallbladder:  The gallbladder is normal in appearance, without evidence for gallstones, gallbladder wall thickening or pericholecystic fluid.  No ultrasonographic Murphy's sign is seen.  Common Bile Duct:  0.5 cm in diameter; within normal limits in caliber.  Liver:  Normal parenchymal echogenicity and echotexture; no focal lesions identified.  Limited Doppler evaluation demonstrates normal blood flow within the liver.  IVC:  Not well characterized due to overlying bowel gas.  Pancreas:  Although the pancreas is difficult to visualize due to overlying bowel gas, no focal pancreatic abnormality  is identified.  Spleen:  11.7 cm in length; within normal limits in size and echotexture.  Right kidney:  11.8 cm in length; normal in size, configuration and parenchymal echogenicity.  No evidence of mass or hydronephrosis.  Left kidney:  10.9 cm in length; normal in size, configuration and parenchymal echogenicity.  No evidence of mass or hydronephrosis.  Abdominal Aorta:  Normal in caliber; no aneurysm identified. Difficult to fully characterize due to overlying bowel gas.  IMPRESSION: Unremarkable abdominal ultrasound.   Original Report Authenticated By: Tonia Ghent, M.D.   Ct Abdomen Pelvis W Contrast  02/27/2013   *RADIOLOGY REPORT*  Clinical Data: Epigastric and right abdominal pain.  CT ABDOMEN AND PELVIS WITH CONTRAST  Technique:  Multidetector CT imaging of the abdomen and pelvis was performed following the standard protocol during bolus administration of intravenous contrast.  Contrast: 50mL OMNIPAQUE IOHEXOL 300 MG/ML  SOLN, OMNIPAQUE IOHEXOL 300 MG/ML  SOLN  Comparison: Ultrasound 02/27/2013  Findings: Lung bases are clear.  No evidence for free air.  Normal appearance of the liver, gallbladder and portal venous system.  9 mm low density structure in the spleen is nonspecific. Normal appearance of the pancreas and adrenal glands.  There is mild perinephric edema, left side greater than right.  Left renal parenchyma is slightly heterogeneous, particularly in the upper pole region.  Findings raise concern for renal inflammation.  No evidence for kidney stones or hydronephrosis.  Urinary bladder wall appears to be thickened but the bladder is nondistended.  No significant free fluid or lymphadenopathy.  No gross abnormality to the uterus or adnexa tissues.  Normal appearance of the appendix.  Normal appearance of the small and large bowel.  No acute bony abnormality.  IMPRESSION: There is mild perinephric edema bilaterally, left side greater than right.  There is also mild heterogeneity of the  left kidney.  Findings are concerning for pyelonephritis.  In addition, the urinary bladder wall is prominent and cannot exclude cystitis.   Original Report Authenticated By: Richarda Overlie, M.D.    Jeoffrey Massed, MD  [facility] Pager:[phone]  If 7PM-7AM, please contact  night-coverage [email or web address] Password [email or web address] 03/03/2013, 9:46 AM   LOS: 1 day

## 2013-03-03 NOTE — Consult Note (Cosign Needed)
NAMEAMAYIA, Hall NO.:  [id number]  MEDICAL RECORD NO.:  [id number]  LOCATION:  5W10C                        FACILITY:  [facility]  PHYSICIAN:  [name], M.D.  DATE OF BIRTH:  Nov 27, 1980  DATE OF CONSULTATION:  03/03/2013 DATE OF DISCHARGE:                                CONSULTATION   ROOM NUMBER:  [id number].  REFERRING PHYSICIAN:  Jeoffrey Massed, MD  REASON FOR CONSULTATION:  Microcytic anemia-likely iron deficiency.  HISTORY OF PRESENT ILLNESS:  [name] is well known to me.  She is a 32 year old, [name].  She has history of iron deficiency anemia.  She also has history of thalassemia minor.  We have been seen her in our office.  She did give birth to a healthy baby boy last year.  She has been getting IV iron in the office.  She has heavy menstrual cycles.  She continues to chew ice.  She was admitted to [facility] with pyelonephritis.  She was found to have a hemoglobin of 7.1.  She had a normal white cell count and a normal platelet count.  She was microcytic.  Her MCV is 57.  She has received 2 units of blood.  Yesterday, her white cell count was 9.8, hemoglobin 8.5, hematocrit 25.9, platelet count 334.  MCV was 60.  She did have iron studies done.  Her iron saturation was only 16%.  Iron was 38.  Her ferritin was 1065.  This is an acute phase reactant.  Her B12 level was 1277.  She did have cultures done.  So far, I think cultures did show E. coli. This was done on the 14th.  E. coli was resistant to ampicillin, gentamicin, and Bactrim.  She subsequently has been admitted.  She had a retic count, that was only 1.1%.  She, again, still chewing ice.  She does have bad hemorrhoids.  She has some bleeding from the hemorrhoids.  She sees Dr. Henderson Cloud.  Dr. Cherly Hensen feels that a hysterectomy will be necessary because of the heavy menstrual bleeding.  There is also a lot of cramps.  I think the patient  may also have some endometriosis.  She did have a CT scan of the abdomen and pelvis done on the 15th.  This did not show any obvious abdominal bleeding.  She did have some perinephric edema on the left.  This was concerning for pyelonephritis.  She did not have any rashes.  She has had a couple of headaches.  PAST MEDICAL HISTORY: 1. Pretty much unremarkable outside of the beta thalassemia minor. 2. Seizures.  ALLERGIES:  Her allergies are to Keflex and latex.  ADMISSION MEDICATIONS:  Cipro 500 mg p.o. b.i.d., Depakote 500 mg p.o. b.i.d., Zofran as needed.  Phenergan suppositories 25 mg q.6 hours p.r.n., Flonase nasal spray as needed, Vicodin (5/325) 1 q.6 hours p.r.n., Vimpat 1000 mg p.o. b.i.d., Ambien 10 mg p.o. at bedtime p.r.n.  SOCIAL HISTORY:  Negative for tobacco or alcohol use.  FAMILY HISTORY:  Unremarkable.  PHYSICAL EXAMINATION:  General:  This is a well-developed, well- nourished [place] American female, in no obvious distress.  Vital Signs: Temperature of 98.9, pulse 74, respiratory rate 18, blood pressure  109/64.  Head and Neck:  No ocular or oral lesions.  There is no scleral icterus.  There is no glossitis.  She has no adenopathy in the neck. Lungs:  Clear bilaterally.  Cardiac:  Regular rate and rhythm with normal S1, S2.  There are no murmurs, rubs, or bruits.  Abdomen:  Soft with good bowel sounds.  There is some tenderness over the left side of the abdomen.  She has no fluid wave.  There is no palpable hepatosplenomegaly.  Extremities:  No clubbing, cyanosis, or edema. Skin:  No rashes, ecchymosis, or petechia.  LABORATORY STUDIES:  White cell count is 9, hemoglobin 8.3, hematocrit 25.3, platelet count 334.  MCV is 60.5.  Peripheral smear shows microcytic red cells.  She has a few target cells.  I see no schistocytes.  She has no rouleaux formation.  There is no nucleated red cells.  She has anisocytosis and poikilocytosis.  White cells appear normal in  morphology maturation.  I see no hypersegmented polys.  There is no immature myeloid or lymphoid cells.  Platelets are adequate in number and size.  IMPRESSION:  [name] is a very charming 32 year old, [name].  She has beta thalassemia minor.  This really is not much of a factor from my point of view.  Iron deficiency is still a problem.  I suspect that she just has a lot of bleeding.  She also has hemorrhoids that she bleeds from.  Her MCV in her blood smear both are highly suggestive of continued iron deficiency.  The fact that her reticulocyte count is below 1 when corrected for her, hematocrit is indicative of a minimal marrow response secondary to low iron.  She also may have some degree of increased red cell breakdown from the pyelonephritis with E. coli.  I think that while she is in the hospital, we should give her a dose of iron dextran.  We can certainly give her a good dose of iron.  I would give her 1500 mg dose.  Again, I do not see any other issues with respect to the anemia outside of the iron deficiency.  When we suspect that her blood counts should improve once this pyelonephritis is treated and that her iron stores are repleted.  As well as, was nice seeing her.  She was with her mom.  We did go ahead and have a good prayer session.  I suspect that she probably in the hospital through the weekend getting treatment for the pyelonephritis.     [name], M.D.     PRE/MEDQ  D:  03/03/2013  T:  03/03/2013  Job:  [id number]

## 2013-03-03 NOTE — Consult Note (Signed)
#  [id number] is consult note.  Lawerance Sabal 1:5-7

## 2013-03-03 NOTE — Progress Notes (Signed)
PHARMACIST - PHYSICIAN COMMUNICATION DR:   Ghimire CONCERNING: Antibiotic IV to Oral Route Change Policy  RECOMMENDATION: This patient is receiving Levaquin by the intravenous route.  Based on criteria approved by the Pharmacy and Therapeutics Committee, the antibiotic(s) is/are being converted to the equivalent oral dose form(s).   DESCRIPTION: These criteria include:  Patient being treated for a respiratory tract infection, urinary tract infection, cellulitis or clostridium difficile associated diarrhea if on metronidazole  The patient is not neutropenic and does not exhibit a GI malabsorption state  The patient is eating (either orally or via tube) and/or has been taking other orally administered medications for a least 24 hours  The patient is improving clinically and has a Tmax < 100.5  If you have questions about this conversion, please contact the Pharmacy Department  []  ( [phone] )  [place] [x]  ( [phone] )  [place]  []  ( [phone] )  [facility] []  ( [phone] )  [facility]     

## 2013-03-04 LAB — HEMOGLOBIN AND HEMATOCRIT, BLOOD
HCT: 28.3 % — ABNORMAL LOW (ref 36.0–46.0)
Hemoglobin: 9 g/dL — ABNORMAL LOW (ref 12.0–15.0)

## 2013-03-04 MED ORDER — OXYCODONE-ACETAMINOPHEN 5-325 MG PO TABS: 1.0000 | ORAL_TABLET | Freq: Three times a day (TID) | ORAL | Status: DC | PRN

## 2013-03-04 MED ORDER — ONDANSETRON 8 MG PO TBDP: 8.0000 mg | ORAL_TABLET | Freq: Three times a day (TID) | ORAL | Status: DC | PRN

## 2013-03-04 MED ORDER — POLYETHYLENE GLYCOL 3350 17 G PO PACK: 17.0000 g | PACK | Freq: Two times a day (BID) | ORAL | Status: DC

## 2013-03-04 MED ORDER — LEVOFLOXACIN 750 MG PO TABS: 750.0000 mg | ORAL_TABLET | Freq: Every day | ORAL | Status: DC

## 2013-03-04 NOTE — Progress Notes (Signed)
03/04/13 patient discharged home today. IV site removed and discharge instructions reviewed with patient.

## 2013-03-04 NOTE — Discharge Summary (Signed)
PATIENT DETAILS Name: Abigail Hall Age: 32 y.o. Sex: female Date of Birth: 03-07-1981 MRN: [id number]. Admit Date: 03/02/2013 Admitting Physician: Lynden Oxford, MD WUJ:[id number],NWGNFAO E, MD  Recommendations for Outpatient Follow-up:  1. Monitor Hb/Hct closely 2. Suggest outpatient GYN referral for evaluation of Menorrhagia 3. Please follow blood cultures on 7/18-negative at time of discharge  PRIMARY DISCHARGE DIAGNOSIS:  Principal Problem:   Microcytic anemia Active Problems:   Beta thalassemia minor   External hemorrhoids with pain   Pyelonephritis Lt>Right      PAST MEDICAL HISTORY: Past Medical History  Diagnosis Date  . Hemorrhoids   . Rectal bleeding   . Constipation   . Nausea & vomiting   . Rectal pain   . Weakness   . Generalized headaches   . Anemia   . Thalassemia   . Depression   . Diabetes mellitus     gestational DM 2013 pregnancy-resolved  . Seizures     last seizure was  03/16/12  . Anemia, iron deficiency 04/10/2012    DISCHARGE MEDICATIONS:   Medication List    STOP taking these medications       ciprofloxacin 500 MG tablet  Commonly known as:  CIPRO     promethazine 25 MG suppository  Commonly known as:  PHENERGAN      TAKE these medications       acetaminophen-codeine 300-30 MG per tablet  Commonly known as:  TYLENOL #3  Take 1-2 tablets by mouth every 6 (six) hours as needed for pain.     divalproex 500 MG DR tablet  Commonly known as:  DEPAKOTE  Take 500 mg by mouth 2 (two) times daily.     fluticasone 50 MCG/ACT nasal spray  Commonly known as:  FLONASE     lacosamide 50 MG Tabs  Commonly known as:  VIMPAT  Take 100 mg by mouth 2 (two) times daily.     levofloxacin 750 MG tablet  Commonly known as:  LEVAQUIN  Take 1 tablet (750 mg total) by mouth daily.     ondansetron 8 MG disintegrating tablet  Commonly known as:  ZOFRAN ODT  Take 1 tablet (8 mg total) by mouth every 8 (eight) hours as needed for nausea.     oxyCODONE-acetaminophen 5-325 MG per tablet  Commonly known as:  PERCOCET/ROXICET  Take 1 tablet by mouth every 8 (eight) hours as needed for pain. As needed only     polyethylene glycol packet  Commonly known as:  MIRALAX / GLYCOLAX  Take 17 g by mouth 2 (two) times daily.     zolpidem 5 MG tablet  Commonly known as:  AMBIEN  Take 2 tablets (10 mg total) by mouth at bedtime as needed. As needed for insomnia        ALLERGIES:   Allergies  Allergen Reactions  . Latex Itching, Swelling and Rash    Where touched.  . Keflex (Cephalexin) Hives    BRIEF HPI:  See H&P, Labs, Consult and Test reports for all details in brief, patient was admitted for fever, along with left sided abdominal pain. She had associated headaches as well. She recently was seen in the emergency room, diagnosed with UTI and given Cipro and sent home. She was admitted for further evaluation and treatment.  CONSULTATIONS:   hematology/oncology  PERTINENT RADIOLOGIC STUDIES: X-ray Chest Pa And Lateral   03/02/2013   *RADIOLOGY REPORT*  Clinical Data: Shortness of breath, crackles at bases, nausea, vomiting, fever  CHEST - 2 VIEW  Comparison: 02/27/2013  Findings: Normal heart size, mediastinal contours, and pulmonary vascularity. Minimal bronchitic changes. No pulmonary infiltrate, pleural effusion or pneumothorax. No acute osseous findings.  IMPRESSION: Minimal bronchitic changes.   Original Report Authenticated By: Ulyses Southward, M.D.   Dg Chest 2 View  02/27/2013   *RADIOLOGY REPORT*  Clinical Data: Nausea and vomiting; chills.  Chest pain.  CHEST - 2 VIEW  Comparison: Thoracic spine radiographs performed 07/25/2006  Findings: The lungs are well-aerated and clear.  There is no evidence of focal opacification, pleural effusion or pneumothorax.  The heart is normal in size; the mediastinal contour is within normal limits.  No acute osseous abnormalities are seen.  IMPRESSION: No acute cardiopulmonary process seen.    Original Report Authenticated By: Tonia Ghent, M.D.   US Abdomen Complete  02/27/2013   *RADIOLOGY REPORT*  Clinical Data:  Abdominal pain, vomiting and fever.  ABDOMINAL ULTRASOUND COMPLETE  Comparison:  Abdominal ultrasound performed 07/25/2012  Findings:  Gallbladder:  The gallbladder is normal in appearance, without evidence for gallstones, gallbladder wall thickening or pericholecystic fluid.  No ultrasonographic Murphy's sign is seen.  Common Bile Duct:  0.5 cm in diameter; within normal limits in caliber.  Liver:  Normal parenchymal echogenicity and echotexture; no focal lesions identified.  Limited Doppler evaluation demonstrates normal blood flow within the liver.  IVC:  Not well characterized due to overlying bowel gas.  Pancreas:  Although the pancreas is difficult to visualize due to overlying bowel gas, no focal pancreatic abnormality is identified.  Spleen:  11.7 cm in length; within normal limits in size and echotexture.  Right kidney:  11.8 cm in length; normal in size, configuration and parenchymal echogenicity.  No evidence of mass or hydronephrosis.  Left kidney:  10.9 cm in length; normal in size, configuration and parenchymal echogenicity.  No evidence of mass or hydronephrosis.  Abdominal Aorta:  Normal in caliber; no aneurysm identified. Difficult to fully characterize due to overlying bowel gas.  IMPRESSION: Unremarkable abdominal ultrasound.   Original Report Authenticated By: Tonia Ghent, M.D.   Ct Abdomen Pelvis W Contrast  02/27/2013   *RADIOLOGY REPORT*  Clinical Data: Epigastric and right abdominal pain.  CT ABDOMEN AND PELVIS WITH CONTRAST  Technique:  Multidetector CT imaging of the abdomen and pelvis was performed following the standard protocol during bolus administration of intravenous contrast.  Contrast: 50mL OMNIPAQUE IOHEXOL 300 MG/ML  SOLN, OMNIPAQUE IOHEXOL 300 MG/ML  SOLN  Comparison: Ultrasound 02/27/2013  Findings: Lung bases are clear.  No evidence for  free air.  Normal appearance of the liver, gallbladder and portal venous system.  9 mm low density structure in the spleen is nonspecific. Normal appearance of the pancreas and adrenal glands.  There is mild perinephric edema, left side greater than right.  Left renal parenchyma is slightly heterogeneous, particularly in the upper pole region.  Findings raise concern for renal inflammation.  No evidence for kidney stones or hydronephrosis.  Urinary bladder wall appears to be thickened but the bladder is nondistended.  No significant free fluid or lymphadenopathy.  No gross abnormality to the uterus or adnexa tissues.  Normal appearance of the appendix.  Normal appearance of the small and large bowel.  No acute bony abnormality.  IMPRESSION: There is mild perinephric edema bilaterally, left side greater than right.  There is also mild heterogeneity of the left kidney.  Findings are concerning for pyelonephritis.  In addition, the urinary bladder wall is prominent and cannot exclude cystitis.   Original Report  Authenticated By: Richarda Overlie, M.D.     PERTINENT LAB RESULTS: CBC:  Recent Labs  03/02/13 2330 03/03/13 0523 03/04/13 0617  WBC 9.8 9.0  --   HGB 8.5* 8.3* 9.0*  HCT 25.9* 25.3* 28.3*  PLT 334 334  --    CMET CMP     Component Value Date/Time   NA 137 03/03/2013 0523   K 4.1 03/03/2013 0523   CL 101 03/03/2013 0523   CO2 27 03/03/2013 0523   GLUCOSE 209* 03/03/2013 0523   BUN 6 03/03/2013 0523   CREATININE 0.70 03/03/2013 0523   CREATININE 0.53 11/04/2011 1800   CALCIUM 9.1 03/03/2013 0523   PROT 6.7 03/02/2013 0951   ALBUMIN 2.6* 03/02/2013 0951   AST 22 03/02/2013 0951   ALT 43* 03/02/2013 0951   ALKPHOS 73 03/02/2013 0951   BILITOT 0.3 03/02/2013 0951   GFRNONAA >90 03/03/2013 0523   GFRAA >90 03/03/2013 0523    GFR Estimated Creatinine Clearance: 129.6 ml/min (by C-G formula based on Cr of 0.7). No results found for this basename: LIPASE, AMYLASE,  in the last 72 hours No results  found for this basename: CKTOTAL, CKMB, CKMBINDEX, TROPONINI,  in the last 72 hours No components found with this basename: POCBNP,  No results found for this basename: DDIMER,  in the last 72 hours No results found for this basename: HGBA1C,  in the last 72 hours No results found for this basename: CHOL, HDL, LDLCALC, TRIG, CHOLHDL, LDLDIRECT,  in the last 72 hours No results found for this basename: TSH, T4TOTAL, FREET3, T3FREE, THYROIDAB,  in the last 72 hours  Recent Labs  03/02/13 0951 03/02/13 1240  VITAMINB12  --  1277*  FOLATE  --  >20.0  FERRITIN  --  1065*  TIBC 228* 234*  IRON 47 38*  RETICCTPCT  --  1.1   Coags:  Recent Labs  03/02/13 0951  INR 1.11   Microbiology: Recent Results (from the past 240 hour(s))  URINE CULTURE     Status: None   Collection Time    02/26/13 11:26 PM      Result Value Range Status   Specimen Description URINE, CLEAN CATCH   Final   Special Requests NONE   Final   Culture  Setup Time 02/27/2013 09:58   Final   Colony Count >=100,000 COLONIES/ML   Final   Culture ESCHERICHIA COLI   Final   Report Status 02/28/2013 FINAL   Final   Organism ID, Bacteria ESCHERICHIA COLI   Final  CULTURE, BLOOD (ROUTINE X 2)     Status: None   Collection Time    03/02/13  9:55 AM      Result Value Range Status   Specimen Description BLOOD RIGHT ARM   Final   Special Requests BOTTLES DRAWN AEROBIC AND ANAEROBIC 10CC   Final   Culture  Setup Time 03/02/2013 14:10   Final   Culture     Final   Value:        BLOOD CULTURE RECEIVED NO GROWTH TO DATE CULTURE WILL BE HELD FOR 5 DAYS BEFORE ISSUING A FINAL NEGATIVE REPORT   Report Status PENDING   Incomplete  CULTURE, BLOOD (ROUTINE X 2)     Status: None   Collection Time    03/02/13 10:02 AM      Result Value Range Status   Specimen Description BLOOD RIGHT HAND   Final   Special Requests BOTTLES DRAWN AEROBIC ONLY 10CC   Final   Culture  Setup Time 03/02/2013 14:11   Final   Culture     Final   Value:         BLOOD CULTURE RECEIVED NO GROWTH TO DATE CULTURE WILL BE HELD FOR 5 DAYS BEFORE ISSUING A FINAL NEGATIVE REPORT   Report Status PENDING   Incomplete     BRIEF HOSPITAL COURSE:  UTI (E.Coli) Pyelonephritis  -patient was admitted and started on IV Levaquin, recent Urine cx positive for E Coli, and was started on Cipro a few days prior to admission. -She did low grade fever on admission, but has been persistently afebrile since then, clinically non toxic looking.Leukocytosis seen a few days ago has resolved. She is clinically much improved -Today is day 4 of Levaquin-and will plan for atleast another 7 more days on discharge today  -still with left CVA tenderness although better much better compared to on admission, and minimal. Recent CT Abdomen did show some left perinephric stranding.  -Since no further nausea/vomiting, and able to PO antibiotics and narcotics-she is stable to be discharged today to continue with oral antibiotics at home.  -Note Blood cultures done on 7/18 is negative to date  Microcytic Anemia  - known iron deficiency anemia and beta thalassemia minor  - gets outpatient iron transfusions  - no active bleeding complaints  - Transfused 2 units of PRBC on 7/18 with Hb of 6.6, IV Iron on 7/19-Hb up to 9.0 today  -seen by Dr Myna Hidalgo 7/19 and 7/20.  -Etiology of this anemia is felt 2/2 menorrhagia per Dr Gustavo Lah note-patient does see Dr Cherly Hensen as outpatient, and hysterectomy may be necessary at some point (see Dr Gustavo Lah consult note)   Nausea  -this has resolved-tolerating food well.  -suspect 2/2 UTI   Constipation  -started Miralax/Senokot on 7/19 with 2 BM on 7/19.   Seizure Disorder  -c/w Vimpat and Depakote   Beta Thalassemia Minor  - monitor  -Dr Gustavo Lah assistance appreciated  TODAY-DAY OF DISCHARGE:  Subjective:   Abigail Hall today has no headache,no chest abdominal pain,no new weakness tingling or numbness, feels much better wants to  go home today.  Objective:   Blood pressure 118/71, pulse 49, temperature 97.9 F (36.6 C), temperature source Oral, resp. rate 18, height 5\' 4"  (1.626 m), weight 123.2 kg (271 lb 9.7 oz), last menstrual period 02/18/2013, SpO2 98.00%, not currently breastfeeding.  Intake/Output Summary (Last 24 hours) at 03/04/13 0848 Last data filed at 03/04/13 0600  Gross per 24 hour  Intake    390 ml  Output      0 ml  Net    390 ml   Filed Weights   03/01/13 2353 03/02/13 0509  Weight: 120.203 kg (265 lb) 123.2 kg (271 lb 9.7 oz)    Exam Awake Alert, Oriented *3, No new F.N deficits, Normal affect [place].AT,PERRAL Supple Neck,No JVD, No cervical lymphadenopathy appriciated.  Symmetrical Chest wall movement, Good air movement bilaterally, CTAB RRR,No Gallops,Rubs or new Murmurs, No Parasternal Heave +ve B.Sounds, Abd Soft, Non tender, No organomegaly appriciated, No rebound -guarding or rigidity.Minimal left CVA tenderness. No Cyanosis, Clubbing or edema, No new Rash or bruise  DISCHARGE CONDITION: Stable  DISPOSITION: Home  DISCHARGE INSTRUCTIONS:    Activity:  As tolerated   Diet recommendation: Regular Diet      Discharge Orders   Future Appointments Provider Department Dept Phone   03/15/2013 10:30 AM Sharlotte [place] [phone]   03/15/2013 11:00 AM [name],  MD [place] [facility] [phone]   Future Orders Complete By Expires     Call MD for:  persistant nausea and vomiting  As directed     Call MD for:  redness, tenderness, or signs of infection (pain, swelling, redness, odor or green/yellow discharge around incision site)  As directed     Call MD for:  temperature >100.4  As directed     Diet Carb Modified  As directed     Increase activity slowly  As directed        Follow-up Information   Follow up with Fortino Sic, MD. Schedule an appointment as soon as possible for a visit in 1 week.    Contact information:   [address] [place] [address] [phone]       Follow up with [name], MD On 03/15/2013. (keep existing appt)    Contact information:   [address] [place] [address] [phone]      Total Time spent on discharge equals 45 minutes.  SignedJeoffrey Massed 03/04/2013 8:48 AM

## 2013-03-04 NOTE — Progress Notes (Signed)
PATIENT DETAILS Name: Abigail Hall Age: 32 y.o. Sex: female Date of Birth: June 06, 1981 Admit Date: 03/02/2013 Admitting Physician Lynden Oxford, MD NFA:[id number],VHQIONG E, MD  Subjective: No major complaints-minimal left flank area pain. Mild intermittent headaches. Continues to be afebrile. Had 2 BM yesterday  Assessment/Plan: UTI (E.Coli) Pyelonephritis  -patient was admitted and started on IV Levaquin, recent Urine cx positive for E Coli -She did low grade fever on admission, but now afebrile, clinically non toxic looking.Leukocytosis seen a few days ago has resolved.  -will continue with Levaquin day 4-and plan for atleast another 7 more days on discharge today -still with left CVA tenderness although better, although mild. Recent CT Abdomen did show some perinephric stranding. -Since no further nausea/vomiting, and able to PO antibiotics and narcotics-she is stable to be discharged today to continue with oral antibiotics at home.  Microcytic Anemia  - known iron deficiency anemia and beta thalassemia minor  - gets outpatient iron transfusions  - no active bleeding complaints  - Transfused 2 units of PRBC on 7/18 with Hb of 6.6, IV Iron on 7/19-Hb up to 9.0 today -seen by Dr Myna Hidalgo 7/19 and 7/20. -Etiology of this anemia is felt 2/2 menorrhagia per Dr Gustavo Lah note-patient does see Dr Cherly Hensen as outpatient, and hysterectomy may be necessary at some point (see Dr Gustavo Lah consult note)  Nausea  -this has resolved-tolerating food well. -suspect 2/2 UTI   Constipation -started Miralax/Senokot on 7/19 with 2 BM on 7/19.  Seizure Disorder  -c/w Vimpat and Depakote   Beta Thalassemia Minor  - monitor  -Dr Gustavo Lah assistance appreciated  Disposition: Home today  DVT Prophylaxis:  SCD's  Code Status: Full code   Family Communication Mother at bedside  Procedures:  None  CONSULTS:  hematology/oncology   MEDICATIONS: Scheduled Meds: . divalproex  500  mg Oral BID  . fluticasone  1 spray Each Nare Daily  . lacosamide  100 mg Oral BID  . levofloxacin  750 mg Oral Daily  . polyethylene glycol  17 g Oral BID  . prenatal multivitamin  1 tablet Oral QHS  . senna  2 tablet Oral Daily   Continuous Infusions: . sodium chloride 20 mL/hr (03/04/13 0142)   PRN Meds:.acetaminophen, acetaminophen, acetaminophen-codeine, ondansetron, oxyCODONE-acetaminophen, sodium phosphate, zolpidem  Antibiotics: Anti-infectives   Start     Dose/Rate Route Frequency Ordered Stop   03/04/13 1000  levofloxacin (LEVAQUIN) tablet 750 mg     750 mg Oral Daily 03/03/13 0845     03/02/13 0815  levofloxacin (LEVAQUIN) IVPB 750 mg     750 mg 100 mL/hr over 90 Minutes Intravenous Every 24 hours 03/02/13 0741 03/03/13 1016       PHYSICAL EXAM: Vital signs in last 24 hours: Filed Vitals:   03/03/13 0503 03/03/13 1529 03/03/13 2037 03/04/13 0454  BP: 109/64 139/83 148/79 118/71  Pulse: 74 80 80 49  Temp: 98.9 F (37.2 C) 98.4 F (36.9 C) 98.3 F (36.8 C) 97.9 F (36.6 C)  TempSrc: Oral Oral Oral Oral  Resp: 18 18 18 18   Height:      Weight:      SpO2: 100% 99% 96% 98%    Weight change:  Filed Weights   03/01/13 2353 03/02/13 0509  Weight: 120.203 kg (265 lb) 123.2 kg (271 lb 9.7 oz)   Body mass index is 46.6 kg/(m^2).   Gen Exam: Awake and alert with clear speech.   Neck: Supple, No JVD.   Chest: B/L Clear.   CVS: S1  S2 Regular, no murmurs.  Abdomen: soft, BS +, non tender, non distended. Mild CVA tenderness persists Extremities: no edema, lower extremities warm to touch. Neurologic: Non Focal.  Skin: No Rash.  Wounds: N/A.    Intake/Output from previous day:  Intake/Output Summary (Last 24 hours) at 03/04/13 0839 Last data filed at 03/04/13 0600  Gross per 24 hour  Intake    390 ml  Output      0 ml  Net    390 ml     LAB RESULTS: CBC  Recent Labs Lab 02/26/13 2350 03/02/13 0155 03/02/13 0951 03/02/13 2330 03/03/13 0523  03/04/13 0617  WBC 14.2* 9.6 7.9 9.8 9.0  --   HGB 8.5* 7.1* 6.6* 8.5* 8.3* 9.0*  HCT 26.8* 22.2* 20.8* 25.9* 25.3* 28.3*  PLT 273 335 303 334 334  --   MCV 57.9* 56.8* 56.5* 60.5* 60.5*  --   MCH 18.4* 18.2* 17.9* 19.9* 19.9*  --   MCHC 31.7 32.0 31.7 32.8 32.8  --   RDW 17.1* 16.3* 16.4* 21.3* 21.1*  --   LYMPHSABS 1.4 3.7 3.2 3.3  --   --   MONOABS 2.1* 0.9 0.6 0.8  --   --   EOSABS 0.0 0.2 0.2 0.2  --   --   BASOSABS 0.0 0.0 0.0 0.0  --   --     Chemistries   Recent Labs Lab 02/26/13 2350 03/02/13 0155 03/02/13 0951 03/03/13 0523  NA 140 138 138 137  K 4.0 3.9 3.4* 4.1  CL 102 100 101 101  CO2 23 28 26 27   GLUCOSE 196* 172* 207* 209*  BUN 10 6 6 6   CREATININE 0.90 0.70 0.74 0.70  CALCIUM 9.9 9.7 9.0 9.1    CBG: No results found for this basename: GLUCAP,  in the last 168 hours  GFR Estimated Creatinine Clearance: 129.6 ml/min (by C-G formula based on Cr of 0.7).  Coagulation profile  Recent Labs Lab 03/02/13 0951  INR 1.11    Cardiac Enzymes No results found for this basename: CK, CKMB, TROPONINI, MYOGLOBIN,  in the last 168 hours  No components found with this basename: POCBNP,  No results found for this basename: DDIMER,  in the last 72 hours No results found for this basename: HGBA1C,  in the last 72 hours No results found for this basename: CHOL, HDL, LDLCALC, TRIG, CHOLHDL, LDLDIRECT,  in the last 72 hours No results found for this basename: TSH, T4TOTAL, FREET3, T3FREE, THYROIDAB,  in the last 72 hours  Recent Labs  03/02/13 0951 03/02/13 1240  VITAMINB12  --  1277*  FOLATE  --  >20.0  FERRITIN  --  1065*  TIBC 228* 234*  IRON 47 38*  RETICCTPCT  --  1.1   No results found for this basename: LIPASE, AMYLASE,  in the last 72 hours  Urine Studies No results found for this basename: UACOL, UAPR, USPG, UPH, UTP, UGL, UKET, UBIL, UHGB, UNIT, UROB, ULEU, UEPI, UWBC, URBC, UBAC, CAST, CRYS, UCOM, BILUA,  in the last 72  hours  MICROBIOLOGY: Recent Results (from the past 240 hour(s))  URINE CULTURE     Status: None   Collection Time    02/26/13 11:26 PM      Result Value Range Status   Specimen Description URINE, CLEAN CATCH   Final   Special Requests NONE   Final   Culture  Setup Time 02/27/2013 09:58   Final   Colony Count >=100,000 COLONIES/ML   Final  Culture ESCHERICHIA COLI   Final   Report Status 02/28/2013 FINAL   Final   Organism ID, Bacteria ESCHERICHIA COLI   Final  CULTURE, BLOOD (ROUTINE X 2)     Status: None   Collection Time    03/02/13  9:55 AM      Result Value Range Status   Specimen Description BLOOD RIGHT ARM   Final   Special Requests BOTTLES DRAWN AEROBIC AND ANAEROBIC 10CC   Final   Culture  Setup Time 03/02/2013 14:10   Final   Culture     Final   Value:        BLOOD CULTURE RECEIVED NO GROWTH TO DATE CULTURE WILL BE HELD FOR 5 DAYS BEFORE ISSUING A FINAL NEGATIVE REPORT   Report Status PENDING   Incomplete  CULTURE, BLOOD (ROUTINE X 2)     Status: None   Collection Time    03/02/13 10:02 AM      Result Value Range Status   Specimen Description BLOOD RIGHT HAND   Final   Special Requests BOTTLES DRAWN AEROBIC ONLY 10CC   Final   Culture  Setup Time 03/02/2013 14:11   Final   Culture     Final   Value:        BLOOD CULTURE RECEIVED NO GROWTH TO DATE CULTURE WILL BE HELD FOR 5 DAYS BEFORE ISSUING A FINAL NEGATIVE REPORT   Report Status PENDING   Incomplete    RADIOLOGY STUDIES/RESULTS: X-ray Chest Pa And Lateral   03/02/2013   *RADIOLOGY REPORT*  Clinical Data: Shortness of breath, crackles at bases, nausea, vomiting, fever  CHEST - 2 VIEW  Comparison: 02/27/2013  Findings: Normal heart size, mediastinal contours, and pulmonary vascularity. Minimal bronchitic changes. No pulmonary infiltrate, pleural effusion or pneumothorax. No acute osseous findings.  IMPRESSION: Minimal bronchitic changes.   Original Report Authenticated By: Ulyses Southward, M.D.   Dg Chest 2  View  02/27/2013   *RADIOLOGY REPORT*  Clinical Data: Nausea and vomiting; chills.  Chest pain.  CHEST - 2 VIEW  Comparison: Thoracic spine radiographs performed 07/25/2006  Findings: The lungs are well-aerated and clear.  There is no evidence of focal opacification, pleural effusion or pneumothorax.  The heart is normal in size; the mediastinal contour is within normal limits.  No acute osseous abnormalities are seen.  IMPRESSION: No acute cardiopulmonary process seen.   Original Report Authenticated By: Tonia Ghent, M.D.   US Abdomen Complete  02/27/2013   *RADIOLOGY REPORT*  Clinical Data:  Abdominal pain, vomiting and fever.  ABDOMINAL ULTRASOUND COMPLETE  Comparison:  Abdominal ultrasound performed 07/25/2012  Findings:  Gallbladder:  The gallbladder is normal in appearance, without evidence for gallstones, gallbladder wall thickening or pericholecystic fluid.  No ultrasonographic Murphy's sign is seen.  Common Bile Duct:  0.5 cm in diameter; within normal limits in caliber.  Liver:  Normal parenchymal echogenicity and echotexture; no focal lesions identified.  Limited Doppler evaluation demonstrates normal blood flow within the liver.  IVC:  Not well characterized due to overlying bowel gas.  Pancreas:  Although the pancreas is difficult to visualize due to overlying bowel gas, no focal pancreatic abnormality is identified.  Spleen:  11.7 cm in length; within normal limits in size and echotexture.  Right kidney:  11.8 cm in length; normal in size, configuration and parenchymal echogenicity.  No evidence of mass or hydronephrosis.  Left kidney:  10.9 cm in length; normal in size, configuration and parenchymal echogenicity.  No evidence of mass or hydronephrosis.  Abdominal Aorta:  Normal in caliber; no aneurysm identified. Difficult to fully characterize due to overlying bowel gas.  IMPRESSION: Unremarkable abdominal ultrasound.   Original Report Authenticated By: Tonia Ghent, M.D.   Ct Abdomen Pelvis  W Contrast  02/27/2013   *RADIOLOGY REPORT*  Clinical Data: Epigastric and right abdominal pain.  CT ABDOMEN AND PELVIS WITH CONTRAST  Technique:  Multidetector CT imaging of the abdomen and pelvis was performed following the standard protocol during bolus administration of intravenous contrast.  Contrast: 50mL OMNIPAQUE IOHEXOL 300 MG/ML  SOLN, OMNIPAQUE IOHEXOL 300 MG/ML  SOLN  Comparison: Ultrasound 02/27/2013  Findings: Lung bases are clear.  No evidence for free air.  Normal appearance of the liver, gallbladder and portal venous system.  9 mm low density structure in the spleen is nonspecific. Normal appearance of the pancreas and adrenal glands.  There is mild perinephric edema, left side greater than right.  Left renal parenchyma is slightly heterogeneous, particularly in the upper pole region.  Findings raise concern for renal inflammation.  No evidence for kidney stones or hydronephrosis.  Urinary bladder wall appears to be thickened but the bladder is nondistended.  No significant free fluid or lymphadenopathy.  No gross abnormality to the uterus or adnexa tissues.  Normal appearance of the appendix.  Normal appearance of the small and large bowel.  No acute bony abnormality.  IMPRESSION: There is mild perinephric edema bilaterally, left side greater than right.  There is also mild heterogeneity of the left kidney.  Findings are concerning for pyelonephritis.  In addition, the urinary bladder wall is prominent and cannot exclude cystitis.   Original Report Authenticated By: Richarda Overlie, M.D.    Jeoffrey Massed, MD  [facility] Pager:[phone]  If 7PM-7AM, please contact night-coverage [email or web address] Password [facility] 03/04/2013, 8:39 AM   LOS: 2 days

## 2013-03-04 NOTE — Progress Notes (Signed)
Ms. Langenfeld her iron yesterday. She did okay with this. There are no reactions.  There is no CBC back yet today. Correction continues to get IV antibiotics. Was that she had Escherichia coli from recent urinary evaluation.  There is no nausea vomiting. There is no bowel pain. She's a little constipation. There was a slight bleeding from hemorrhoids that she has.  She is out of bed a little bit.  Does not complaining of much abdominal pain.  Her physical exam is fairly unremarkable. Vital signs are stable. Blood pressure 118/71. Her abdomen is soft. Not tender to palpation. No palpable abdominal mass. No palpable hepato- splenomegaly. Lungs are clear. Cardiac exam regular rate and rhythm. No murmurs rubs or bruits.  Again, we are waiting the  CBC. She said that lab has not been up yet.  Hopefully, she will be able to go home today.  She has an appointment with me for 10 days. I told her that she can call us if she has any problems before then.    Pete E.

## 2013-03-08 LAB — CULTURE, BLOOD (ROUTINE X 2)
Culture: NO GROWTH
Culture: NO GROWTH

## 2013-03-15 ENCOUNTER — Ambulatory Visit: Payer: Self-pay | Admitting: Hematology & Oncology

## 2013-03-15 ENCOUNTER — Other Ambulatory Visit: Payer: Self-pay | Admitting: Lab

## 2013-03-16 ENCOUNTER — Ambulatory Visit (HOSPITAL_BASED_OUTPATIENT_CLINIC_OR_DEPARTMENT_OTHER): Payer: Managed Care, Other (non HMO) | Admitting: Hematology & Oncology

## 2013-03-16 ENCOUNTER — Other Ambulatory Visit (HOSPITAL_BASED_OUTPATIENT_CLINIC_OR_DEPARTMENT_OTHER): Payer: Managed Care, Other (non HMO) | Admitting: Lab

## 2013-03-16 VITALS — BP 118/64 | HR 77 | Temp 97.9°F | Resp 20

## 2013-03-16 DIAGNOSIS — N920 Excessive and frequent menstruation with regular cycle: Secondary | ICD-10-CM

## 2013-03-16 DIAGNOSIS — D509 Iron deficiency anemia, unspecified: Secondary | ICD-10-CM

## 2013-03-16 DIAGNOSIS — D563 Thalassemia minor: Secondary | ICD-10-CM

## 2013-03-16 LAB — CBC WITH DIFFERENTIAL (CANCER CENTER ONLY)
BASO#: 0 10*3/uL (ref 0.0–0.2)
BASO%: 0.3 % (ref 0.0–2.0)
EOS%: 1.3 % (ref 0.0–7.0)
Eosinophils Absolute: 0.1 10*3/uL (ref 0.0–0.5)
HCT: 28.7 % — ABNORMAL LOW (ref 34.8–46.6)
HGB: 9.1 g/dL — ABNORMAL LOW (ref 11.6–15.9)
LYMPH#: 4.4 10*3/uL — ABNORMAL HIGH (ref 0.9–3.3)
LYMPH%: 44.8 % (ref 14.0–48.0)
MCH: 19.8 pg — ABNORMAL LOW (ref 26.0–34.0)
MCHC: 31.7 g/dL — ABNORMAL LOW (ref 32.0–36.0)
MCV: 63 fL — ABNORMAL LOW (ref 81–101)
MONO#: 0.7 10*3/uL (ref 0.1–0.9)
MONO%: 7.1 % (ref 0.0–13.0)
NEUT#: 4.6 10*3/uL (ref 1.5–6.5)
NEUT%: 46.5 % (ref 39.6–80.0)
Platelets: 334 10*3/uL (ref 145–400)
RBC: 4.59 10*6/uL (ref 3.70–5.32)
RDW: 22.3 % — ABNORMAL HIGH (ref 11.1–15.7)
WBC: 9.9 10*3/uL (ref 3.9–10.0)

## 2013-03-16 LAB — IRON AND TIBC CHCC
%SAT: 37 % (ref 21–57)
Iron: 95 ug/dL (ref 41–142)
TIBC: 259 ug/dL (ref 236–444)
UIBC: 164 ug/dL (ref 120–384)

## 2013-03-16 LAB — TECHNOLOGIST REVIEW CHCC SATELLITE

## 2013-03-16 LAB — FERRITIN CHCC: Ferritin: 1725 ng/ml — ABNORMAL HIGH (ref 9–269)

## 2013-03-16 MED ORDER — FOLIC ACID 1 MG PO TABS: 2.0000 mg | ORAL_TABLET | Freq: Every day | ORAL | Status: DC

## 2013-03-16 NOTE — Progress Notes (Signed)
This office note has been dictated.

## 2013-03-19 NOTE — Progress Notes (Signed)
CC:   Abigail Hall, M.D. Arlyce Harman, MD  DIAGNOSIS:  Microcytic anemia, iron deficiency/beta thalassemia minor.  CURRENT THERAPY: 1. IV iron as indicated. 2. Folic acid 2 mg p.o. q. day.  INTERVAL HISTORY:  Abigail Hall comes in for followup.  She was admitted over to [facility] a couple weeks ago.  At that point in time, she came in with a hemoglobin of 7.  She has heavy cycles.  She sees Dr. Cherly Hensen.  Dr. Cherly Hensen is going to do a hysterectomy.  We are not sure when this is going to actually be done.  While over at [facility], Abigail Hall did get some IV iron.  I gave her IV iron dextran.  She had 1500 mg.  She tolerated this fairly well.  When I saw her over at [facility], she also was dealing with an E coli urinary tract infection.  Unfortunately, her grandmother passed away while visiting.  She had her funeral yesterday.  Abigail Hall has been going back and forth to [place] to try to help out the family.  She feels Hall.  She starts her cycle tomorrow.  She is not sure, again, when her hysterectomy will be.  She has heavy cycles.  When we saw her over at the hospital, her iron saturation was 16 with a total iron of 38.  Her ferritin was 1065.  This was an acute-phase reactant.  She did have a CT scan of the abdomen and pelvis while she was at the hospital.  This was pretty much unremarkable aside from some pyelonephritic changes of her left kidney.  Again, she does feel a little Hall.  She has had no leg swelling. There has been no rashes.  PHYSICAL EXAMINATION:  General:  This is a well-developed, well- nourished [name] in no obvious distress.  Vital signs: Temperature of 97.9, pulse 77, respiratory rate 20, blood pressure 118/64.  Weight is 266.  Head and neck:  Normocephalic, atraumatic skull.  There are no ocular or oral lesions.  Neck:  There are no palpable cervical or supraclavicular lymph nodes.  Lungs:   Clear bilaterally.  Cardiac:  Regular rate and rhythm with a normal S1 and S2. There are no murmurs, rubs, or bruits.  Abdomen:  Soft.  She has good bowel sounds.  There is no palpable abdominal mass.  There is no fluid wave.  There is no palpable hepatosplenomegaly.  Extremities:  No clubbing, cyanosis, or edema.  Neurologic:  No focal neurological deficits.  LABORATORY STUDIES:  White cell count is 9.9, hemoglobin 9.1, hematocrit 28.7, platelet count 334.  MCV is 63.  IMPRESSION:  [name] is a 32 year old [name] with beta thalassemia minor.  When we last checked her hemoglobin electrophoresis in March, she had 5% hemoglobin A2.  I still feel like she is iron deficient.  It has been incredibly difficult to try to get her hemoglobin above 10.  Again, she does have heavy cycles.  Hopefully, with a hysterectomy, this will resolve the main problem for her anemia.  Of note, when we saw her at the hospital, her reticulocyte count was nonexistent.  This is highly indicative of a low iron state from my point of view.  We will see what her iron studies show this time.  We may need to be aggressive with the IV iron.  I want to see her back in about 2 weeks' time.    ______________________________ [name], M.D. PRE/MEDQ  D:  03/16/2013  T:  03/16/2013  Job:  [id number]

## 2013-03-20 LAB — HEMOGLOBINOPATHY EVALUATION
Hemoglobin Other: 0 %
Hgb A2 Quant: 4.4 % — ABNORMAL HIGH (ref 2.2–3.2)
Hgb A: 93.9 % — ABNORMAL LOW (ref 96.8–97.8)
Hgb F Quant: 1.7 % (ref 0.0–2.0)
Hgb S Quant: 0 %

## 2013-03-20 LAB — RETICULOCYTES (CHCC)
ABS Retic: 95.8 10*3/uL (ref 19.0–186.0)
RBC.: 4.79 MIL/uL (ref 3.87–5.11)
Retic Ct Pct: 2 % (ref 0.4–2.3)

## 2013-03-30 ENCOUNTER — Other Ambulatory Visit (HOSPITAL_BASED_OUTPATIENT_CLINIC_OR_DEPARTMENT_OTHER): Payer: Managed Care, Other (non HMO) | Admitting: Lab

## 2013-03-30 ENCOUNTER — Ambulatory Visit (HOSPITAL_BASED_OUTPATIENT_CLINIC_OR_DEPARTMENT_OTHER): Payer: Managed Care, Other (non HMO) | Admitting: Hematology & Oncology

## 2013-03-30 VITALS — BP 121/55 | HR 85 | Temp 98.3°F | Resp 20 | Wt 276.0 lb

## 2013-03-30 DIAGNOSIS — D509 Iron deficiency anemia, unspecified: Secondary | ICD-10-CM

## 2013-03-30 DIAGNOSIS — D563 Thalassemia minor: Secondary | ICD-10-CM

## 2013-03-30 DIAGNOSIS — D649 Anemia, unspecified: Secondary | ICD-10-CM

## 2013-03-30 LAB — CBC WITH DIFFERENTIAL (CANCER CENTER ONLY)
BASO#: 0 10*3/uL (ref 0.0–0.2)
BASO%: 0.2 % (ref 0.0–2.0)
EOS%: 1.6 % (ref 0.0–7.0)
Eosinophils Absolute: 0.1 10*3/uL (ref 0.0–0.5)
HCT: 26.7 % — ABNORMAL LOW (ref 34.8–46.6)
HGB: 8.5 g/dL — ABNORMAL LOW (ref 11.6–15.9)
LYMPH#: 3.2 10*3/uL (ref 0.9–3.3)
LYMPH%: 36.2 % (ref 14.0–48.0)
MCH: 19.8 pg — ABNORMAL LOW (ref 26.0–34.0)
MCHC: 31.8 g/dL — ABNORMAL LOW (ref 32.0–36.0)
MCV: 62 fL — ABNORMAL LOW (ref 81–101)
MONO#: 0.6 10*3/uL (ref 0.1–0.9)
MONO%: 6.6 % (ref 0.0–13.0)
NEUT#: 5 10*3/uL (ref 1.5–6.5)
NEUT%: 55.4 % (ref 39.6–80.0)
Platelets: 245 10*3/uL (ref 145–400)
RBC: 4.3 10*6/uL (ref 3.70–5.32)
RDW: 21.4 % — ABNORMAL HIGH (ref 11.1–15.7)
WBC: 9 10*3/uL (ref 3.9–10.0)

## 2013-03-30 LAB — TECHNOLOGIST REVIEW CHCC SATELLITE

## 2013-03-30 NOTE — Progress Notes (Signed)
This office note has been dictated.

## 2013-03-31 NOTE — Progress Notes (Signed)
CC:   Maxie Better, M.D. Arlyce Harman, MD  DIAGNOSES: 1. Persistent microcytic anemia. 2. Beta thalassemia minor. 3. Iron-deficiency anemia.  CURRENT THERAPY: 1. Folic acid 2 mg p.o. daily. 2. IV iron as needed.  INTERIM HISTORY:  Ms. Tift comes in for a followup.  She was last seen in a couple of weeks ago.  At that point in time, her hemoglobin was 9.1.  She is having heavy cycles.  She sees Dr. Cherly Hensen.  Dr. Cherly Hensen feels a hysterectomy is necessary.  I am not sure when this will be done.  We have been giving this Ms. Vonbargen quite a bit of iron.  She has had IV iron on several occasions.  She got iron while she was in [facility] about a month ago.  She had iron back in May.  She, again, has had the heavy cycles.  She does chew ice a little bit.  She has had no bleeding otherwise.  PHYSICAL EXAMINATION:  General:  This is a well-developed, well- nourished [name] in no obvious distress.  Vital signs: Temperature of 98.3, pulse 85, respiratory rate 20, blood pressure 121/55.  Weight is 276.  Head and neck:  Normocephalic, atraumatic skull.  There are no ocular or oral lesions.  There are no palpable cervical or supraclavicular lymph nodes.  Lungs:  Clear bilaterally. Cardiac:  Regular rate and rhythm with a normal S1 and S2.  There are no murmurs, rubs or bruits.  Abdomen:  Soft.  She has good bowel sounds. There is no palpable abdominal mass.  There is no palpable hepatosplenomegaly.  Extremities:  No clubbing, cyanosis or edema.  LABORATORY STUDIES:  White cell count is 9, hemoglobin 8.5, hematocrit 26.7, platelet count 245.  MCV is 62.  IMPRESSION:  Ms. Ellinger is a very charming 32 year old [name] with persistent microcytic anemia.  By her blood smear, I would still have to believe that she has iron deficiency.  When we last did her iron studies in early August, her ferritin was 1700 and iron saturation  was 37%.  I think we are going to have to do a bone marrow test on her.  I really need to see what her iron levels are.  I could not imagine her having anything else going on.  I could not imagine her having myelodysplasia.  Sideroblastic anemia is always a possibility.  Again, one would think that the blood smear would show me some evidence of this.  We will set the bone marrow biopsy up for early next week.  I will see her back in another 2 weeks.  Again, I think we are going to have to do a bone marrow so we can see where she stands with her iron studies.  I suppose it is possible that I just am underestimating how much iron she needs.  I suppose the ferritin could be an acute-phase reactant.  I want to try to get her hemoglobin optimized before she has her hysterectomy.    ______________________________ [name], M.D. PRE/MEDQ  D:  03/30/2013  T:  03/31/2013  Job:  [id number]

## 2013-04-02 ENCOUNTER — Encounter (HOSPITAL_COMMUNITY): Payer: Self-pay | Admitting: Pharmacy Technician

## 2013-04-02 ENCOUNTER — Telehealth: Payer: Self-pay | Admitting: Hematology & Oncology

## 2013-04-02 LAB — IRON AND TIBC CHCC
%SAT: 23 % (ref 21–57)
Iron: 66 ug/dL (ref 41–142)
TIBC: 281 ug/dL (ref 236–444)
UIBC: 215 ug/dL (ref 120–384)

## 2013-04-02 LAB — FERRITIN CHCC: Ferritin: 1267 ng/ml — ABNORMAL HIGH (ref 9–269)

## 2013-04-02 LAB — ERYTHROPOIETIN: Erythropoietin: 48 m[IU]/mL — ABNORMAL HIGH (ref 2.6–18.5)

## 2013-04-02 LAB — RETICULOCYTES (CHCC)
ABS Retic: 162.8 10*3/uL (ref 19.0–186.0)
RBC.: 4.4 MIL/uL (ref 3.87–5.11)
Retic Ct Pct: 3.7 % — ABNORMAL HIGH (ref 0.4–2.3)

## 2013-04-02 NOTE — Telephone Encounter (Signed)
Pt aware of 8-22 BMBX at [facility] radiology at 645 am to be NPO after midnight and she needs a driver. Pt is aware of 9-2 MD, per her request I left detailed message on her cell phone VM. She had questions about BMBX transferred to [facility] RN

## 2013-04-02 NOTE — Telephone Encounter (Signed)
Left pt message to call need to give her details of 8-22 appointment

## 2013-04-03 ENCOUNTER — Other Ambulatory Visit: Payer: Self-pay | Admitting: Radiology

## 2013-04-06 ENCOUNTER — Ambulatory Visit (HOSPITAL_COMMUNITY)
Admission: RE | Admit: 2013-04-06 | Discharge: 2013-04-06 | Disposition: A | Discharge status: Home / Self Care | Payer: Managed Care, Other (non HMO) | Source: Ambulatory Visit | Attending: Hematology & Oncology | Admitting: Hematology & Oncology

## 2013-04-06 ENCOUNTER — Encounter (HOSPITAL_COMMUNITY): Payer: Self-pay

## 2013-04-06 DIAGNOSIS — R569 Unspecified convulsions: Secondary | ICD-10-CM | POA: Insufficient documentation

## 2013-04-06 DIAGNOSIS — D759 Disease of blood and blood-forming organs, unspecified: Secondary | ICD-10-CM | POA: Insufficient documentation

## 2013-04-06 DIAGNOSIS — D649 Anemia, unspecified: Secondary | ICD-10-CM

## 2013-04-06 DIAGNOSIS — D509 Iron deficiency anemia, unspecified: Secondary | ICD-10-CM | POA: Insufficient documentation

## 2013-04-06 DIAGNOSIS — Z79899 Other long term (current) drug therapy: Secondary | ICD-10-CM | POA: Insufficient documentation

## 2013-04-06 DIAGNOSIS — D563 Thalassemia minor: Secondary | ICD-10-CM | POA: Insufficient documentation

## 2013-04-06 LAB — CBC
HCT: 28.9 % — ABNORMAL LOW (ref 36.0–46.0)
Hemoglobin: 8.9 g/dL — ABNORMAL LOW (ref 12.0–15.0)
MCH: 18.9 pg — ABNORMAL LOW (ref 26.0–34.0)
MCHC: 30.8 g/dL (ref 30.0–36.0)
MCV: 61.5 fL — ABNORMAL LOW (ref 78.0–100.0)
Platelets: 266 10*3/uL (ref 150–400)
RBC: 4.7 MIL/uL (ref 3.87–5.11)
RDW: 20.3 % — ABNORMAL HIGH (ref 11.5–15.5)
WBC: 7.6 10*3/uL (ref 4.0–10.5)

## 2013-04-06 LAB — PROTIME-INR
INR: 0.92 (ref 0.00–1.49)
Prothrombin Time: 12.2 seconds (ref 11.6–15.2)

## 2013-04-06 LAB — BONE MARROW EXAM

## 2013-04-06 MED ORDER — MIDAZOLAM HCL 2 MG/2ML IJ SOLN
INTRAMUSCULAR | Status: AC | PRN
  Administered 2013-04-06: 2 mg via INTRAVENOUS
  Administered 2013-04-06: 1 mg via INTRAVENOUS

## 2013-04-06 MED ORDER — FENTANYL CITRATE 0.05 MG/ML IJ SOLN
INTRAMUSCULAR | Status: AC | PRN
  Administered 2013-04-06: 100 ug via INTRAVENOUS
  Administered 2013-04-06: 50 ug via INTRAVENOUS

## 2013-04-06 MED ORDER — HYDROCODONE-ACETAMINOPHEN 5-325 MG PO TABS: 1.0000 | ORAL_TABLET | ORAL | Status: DC | PRN

## 2013-04-06 MED ORDER — MIDAZOLAM HCL 2 MG/2ML IJ SOLN
INTRAMUSCULAR | Status: AC
  Filled 2013-04-06: qty 6

## 2013-04-06 MED ORDER — SODIUM CHLORIDE 0.9 % IV SOLN
INTRAVENOUS | Status: DC
  Administered 2013-04-06: 07:00:00 via INTRAVENOUS

## 2013-04-06 MED ORDER — FENTANYL CITRATE 0.05 MG/ML IJ SOLN
INTRAMUSCULAR | Status: AC
  Filled 2013-04-06: qty 6

## 2013-04-06 NOTE — Procedures (Signed)
CT guided bone marrow aspirates and core biopsy.  No immediate complication.   

## 2013-04-06 NOTE — H&P (Signed)
Chief Complaint: "I am here for a bone marrow biopsy." Referring Physician: Dr. Myna Hidalgo HPI: Abigail Hall is an 32 y.o. female with PMHx of beta thalassemia minor, persistent microcytic anemia, iron deficiency anemia despite Fe transfusions. Patient states her last transfusion was 07/14. She also admits to family history of her mother having thalassemia and sister with traits of sickle cell/thalassemia. She denies any active bleeding, blood in her stool or urine. She did have kidney infection in 07/14 and has finished all antibiotics. She denies any fever, chills, or urinary symptoms. She denies any chest pain or shortness of breath.  Past Medical History:  Past Medical History  Diagnosis Date  . Hemorrhoids   . Rectal bleeding   . Constipation   . Nausea & vomiting   . Rectal pain   . Weakness   . Generalized headaches   . Anemia   . Thalassemia   . Depression   . Diabetes mellitus     gestational DM 2013 pregnancy-resolved  . Seizures     last seizure was  03/16/12  . Anemia, iron deficiency 04/10/2012    Past Surgical History:  Past Surgical History  Procedure Laterality Date  . Tongue surgery    . Laparoscopy    . Carpal tunnel release  01/2009  . Wisdom tooth extraction    . Cesarean section  12/31/2011    Procedure: CESAREAN SECTION;  Surgeon: Delbert Harness, MD;  Location: [facility];  Service: Gynecology;  Laterality: N/A;  . Laparoscopic tubal ligation  03/27/2012    Procedure: LAPAROSCOPIC TUBAL LIGATION;  Surgeon: Delbert Harness, MD;  Location: [facility];  Service: Gynecology;  Laterality: Bilateral;  fallopian tubes caurtery  . Labioplasty  03/27/2012    Procedure: LABIAPLASTY;  Surgeon: Delbert Harness, MD;  Location: [facility];  Service: Gynecology;  Laterality: N/A;  labia    Family History:  Family History  Problem Relation Age of Onset  . Anesthesia problems Neg Hx   . Hypotension Neg Hx   . Malignant hyperthermia Neg Hx   . Pseudochol deficiency Neg Hx   .  Diabetes Mother   . Hyperlipidemia Father   . Diabetes Maternal Grandmother   . Cancer Maternal Grandfather   . Hyperlipidemia Paternal Grandfather     Social History:  reports that she has never smoked. She has never used smokeless tobacco. She reports that she does not drink alcohol or use illicit drugs.  Allergies:  Allergies  Allergen Reactions  . Latex Itching, Swelling and Rash    Where touched.  . Keflex [Cephalexin] Hives      Medication List    ASK your doctor about these medications       divalproex 500 MG 24 hr tablet  Commonly known as:  DEPAKOTE ER  Take 500 mg by mouth 2 (two) times daily. Must be brand     fluticasone 50 MCG/ACT nasal spray  Commonly known as:  FLONASE  Place 1 spray into the nose daily as needed for allergies.     folic acid 1 MG tablet  Commonly known as:  FOLVITE  Take 2 tablets (2 mg total) by mouth daily.     lacosamide 50 MG Tabs tablet  Commonly known as:  VIMPAT  Take 100 mg by mouth 2 (two) times daily.     oxyCODONE-acetaminophen 5-325 MG per tablet  Commonly known as:  PERCOCET/ROXICET  Take 1 tablet by mouth every 8 (eight) hours as needed for pain. As needed only  polyethylene glycol packet  Commonly known as:  MIRALAX / GLYCOLAX  Take 17 g by mouth daily.     prenatal multivitamin Tabs tablet  Take 1 tablet by mouth daily at 12 noon.     vitamin C 500 MG tablet  Commonly known as:  ASCORBIC ACID  Take 500 mg by mouth daily.     zolpidem 5 MG tablet  Commonly known as:  AMBIEN  Take 2 tablets (10 mg total) by mouth at bedtime as needed. As needed for insomnia        Please HPI for pertinent positives, otherwise complete 10 system ROS negative.  Physical Exam: BP 127/70  Pulse 79  Temp(Src) 97.4 F (36.3 C) (Oral)  Resp 20  Ht 5\' 4"  (1.626 m)  Wt 260 lb (117.935 kg)  BMI 44.61 kg/m2  SpO2 100%  LMP 03/29/2013  Breastfeeding? No Body mass index is 44.61 kg/(m^2).   General Appearance:  Alert,  cooperative, no distress, appears stated age  Head:  Normocephalic, without obvious abnormality, atraumatic  Lungs:   Clear to auscultation bilaterally, no w/r/r, respirations unlabored without use of accessory muscles.  Chest Wall:  No tenderness or deformity  Heart:  Regular rate and rhythm, S1, S2 normal, no murmur, rub or gallop.  Abdomen:   Soft, non-tender, non distended.  Extremities: Extremities normal, atraumatic, no cyanosis or edema  Pulses: 2+ and symmetric  Neurologic: Normal affect, no gross deficits.   Results for orders placed during the hospital encounter of 04/06/13 (from the past 48 hour(s))  CBC     Status: Abnormal   Collection Time    04/06/13  7:15 AM      Result Value Range   WBC 7.6  4.0 - 10.5 K/uL   RBC 4.70  3.87 - 5.11 MIL/uL   Hemoglobin 8.9 (*) 12.0 - 15.0 g/dL   HCT 16.1 (*) 09.6 - 04.5 %   MCV 61.5 (*) 78.0 - 100.0 fL   MCH 18.9 (*) 26.0 - 34.0 pg   MCHC 30.8  30.0 - 36.0 g/dL   RDW 40.9 (*) 81.1 - 91.4 %   Platelets 266  150 - 400 K/uL  PROTIME-INR     Status: None   Collection Time    04/06/13  7:15 AM      Result Value Range   Prothrombin Time 12.2  11.6 - 15.2 seconds   INR 0.92  0.00 - 1.49   No results found.  Assessment/Plan Persistent microcytic anemia since 2013, follows with Dr. Myna Hidalgo. Fe deficiency anemia. Beta Thalassemia minor.  Request for CT guided bone marrow biopsy. Labs reviewed, patient has been NPO. Risks and Benefits discussed with the patient and her mother. All of the patient's questions were answered, patient is agreeable to proceed. Consent signed and in chart.   Pattricia Boss D PA-C 04/06/2013, 8:00 AM

## 2013-04-10 ENCOUNTER — Other Ambulatory Visit (HOSPITAL_COMMUNITY): Payer: Self-pay

## 2013-04-12 LAB — CHROMOSOME ANALYSIS, BONE MARROW

## 2013-04-17 ENCOUNTER — Other Ambulatory Visit (HOSPITAL_BASED_OUTPATIENT_CLINIC_OR_DEPARTMENT_OTHER): Payer: Managed Care, Other (non HMO) | Admitting: Lab

## 2013-04-17 ENCOUNTER — Ambulatory Visit (HOSPITAL_BASED_OUTPATIENT_CLINIC_OR_DEPARTMENT_OTHER): Payer: Managed Care, Other (non HMO) | Admitting: Hematology & Oncology

## 2013-04-17 VITALS — BP 120/71 | HR 90 | Temp 98.2°F | Resp 16 | Ht 64.0 in | Wt 273.0 lb

## 2013-04-17 DIAGNOSIS — D509 Iron deficiency anemia, unspecified: Secondary | ICD-10-CM

## 2013-04-17 DIAGNOSIS — D649 Anemia, unspecified: Secondary | ICD-10-CM

## 2013-04-17 DIAGNOSIS — D563 Thalassemia minor: Secondary | ICD-10-CM

## 2013-04-17 LAB — CBC WITH DIFFERENTIAL (CANCER CENTER ONLY)
BASO#: 0 10*3/uL (ref 0.0–0.2)
BASO%: 0.3 % (ref 0.0–2.0)
EOS%: 1.5 % (ref 0.0–7.0)
Eosinophils Absolute: 0.1 10*3/uL (ref 0.0–0.5)
HCT: 28.1 % — ABNORMAL LOW (ref 34.8–46.6)
HGB: 9 g/dL — ABNORMAL LOW (ref 11.6–15.9)
LYMPH#: 3.9 10*3/uL — ABNORMAL HIGH (ref 0.9–3.3)
LYMPH%: 41.8 % (ref 14.0–48.0)
MCH: 19.8 pg — ABNORMAL LOW (ref 26.0–34.0)
MCHC: 32 g/dL (ref 32.0–36.0)
MCV: 62 fL — ABNORMAL LOW (ref 81–101)
MONO#: 0.5 10*3/uL (ref 0.1–0.9)
MONO%: 5 % (ref 0.0–13.0)
NEUT#: 4.8 10*3/uL (ref 1.5–6.5)
NEUT%: 51.4 % (ref 39.6–80.0)
Platelets: 313 10*3/uL (ref 145–400)
RBC: 4.54 10*6/uL (ref 3.70–5.32)
RDW: 20.5 % — ABNORMAL HIGH (ref 11.1–15.7)
WBC: 9.4 10*3/uL (ref 3.9–10.0)

## 2013-04-17 LAB — IRON AND TIBC CHCC
%SAT: 24 % (ref 21–57)
Iron: 71 ug/dL (ref 41–142)
TIBC: 298 ug/dL (ref 236–444)
UIBC: 226 ug/dL (ref 120–384)

## 2013-04-17 LAB — CHCC SATELLITE - SMEAR

## 2013-04-17 LAB — FERRITIN CHCC: Ferritin: 1230 ng/ml — ABNORMAL HIGH (ref 9–269)

## 2013-04-18 LAB — RETICULOCYTES (CHCC)
ABS Retic: 154.4 10*3/uL (ref 19.0–186.0)
RBC.: 4.68 MIL/uL (ref 3.87–5.11)
Retic Ct Pct: 3.3 % — ABNORMAL HIGH (ref 0.4–2.3)

## 2013-04-18 LAB — ERYTHROPOIETIN: Erythropoietin: 36 m[IU]/mL — ABNORMAL HIGH (ref 2.6–18.5)

## 2013-04-18 NOTE — Progress Notes (Signed)
This office note has been dictated.

## 2013-04-19 ENCOUNTER — Other Ambulatory Visit: Payer: Self-pay | Admitting: Obstetrics and Gynecology

## 2013-04-19 NOTE — Progress Notes (Signed)
CC:   Maxie Better, M.D.  DIAGNOSES: 1. Thalassemia minor (beta thalassemia). 2. History of iron deficiency anemia.  CURRENT THERAPY: 1. Folic acid 2 mg p.o. daily. 2. IV iron as indicated.  INTERIM HISTORY:  Ms. Lavey comes in for followup.  We finally did get a bone marrow biopsy done on her.  She was not really responding to iron.  She does have the beta thalassemia minor.  I wanted to see what her bone marrow was doing.  We had the bone marrow test done on August 22nd.  The pathology report ([id number]) showed a slightly hypercellular marrow with erythroid hyperplasia.  She had increased iron stores.  As such, I think a lot of her anemia is from thalassemia minor.  I am a little surprised by this as I did not think that this was much of an issue for her.  Of note, erythropoietin level is only 36.  This certainly could be utilized in trying to improve her anemia, if necessary.  She sees Dr. Cherly Hensen.  Dr. Cherly Hensen wants to do a hysterectomy on her.  I still have no problems with this.  I think this could help her out in the long run.  I suppose that she may get iron deficient at some point.  She feels well.  She has had no significant fatigue.  She still has her heavy cycles.  PHYSICAL EXAMINATION:  General:  This is a well-developed, well- nourished [name] in no obvious distress.  Vital signs: Show temperature of 98.2, pulse is 90, respiratory rate 18, blood pressure 120/71.  Weight is 273.  Head and Neck:  Normocephalic, atraumatic skull.  There are no ocular or oral lesions.  There are no palpable cervical or supraclavicular lymph nodes.  Lungs:  Clear bilaterally.  Cardiac Exam:  Regular rate and rhythm with a normal S1 and S2.  There are no murmurs, rubs or bruits.  Abdomen:  Soft.  She has good bowel sounds.  There is no fluid wave.  There is no palpable hepatosplenomegaly.  Back:  Exam shows no tenderness over the spine, ribs or hips.   Extremities:  Show no clubbing, cyanosis or edema. Neurological:  Exam shows no focal neurological deficits.  LABORATORY STUDIES:  White cell count is 9.4, hemoglobin 9, hematocrit 28.1, platelet count 313.  Reticulocyte count is 3.3%.  Ferritin is 1,230, with iron saturation of 24%.  Erythropoietin level was 36.  IMPRESSION:  Mr. Abigail Hall is a very charming 32 year old African female with beta thalassemia minor.  I believe this is probably the most likely source for the anemia.  We did the bone marrow test on her.  She does not have any hematologic malignancy in the bone marrow.  I do not think we have to go to erythropoietin stimulating agents right now.  Again, I will call Dr. Cherly Hensen and see about getting her uterus taken out.  I think Dr. Cherly Hensen has this planned for September 17th.  I want to see Ms. Blodgett back in about a month afterwards.  She certainly is very challenging.  Hopefully, we will be able to overcome this anemia.    ______________________________ [name], M.D. PRE/MEDQ  D:  04/18/2013  T:  04/19/2013  Job:  [id number]

## 2013-04-26 ENCOUNTER — Encounter (HOSPITAL_COMMUNITY): Payer: Self-pay

## 2013-04-26 ENCOUNTER — Encounter (HOSPITAL_COMMUNITY)
Admission: RE | Admit: 2013-04-26 | Discharge: 2013-04-26 | Disposition: A | Discharge status: Home / Self Care | Payer: Managed Care, Other (non HMO) | Source: Ambulatory Visit | Attending: Obstetrics and Gynecology | Admitting: Obstetrics and Gynecology

## 2013-04-26 DIAGNOSIS — F32A Depression, unspecified: Secondary | ICD-10-CM

## 2013-04-26 DIAGNOSIS — Z01818 Encounter for other preprocedural examination: Secondary | ICD-10-CM | POA: Insufficient documentation

## 2013-04-26 DIAGNOSIS — Z01812 Encounter for preprocedural laboratory examination: Secondary | ICD-10-CM | POA: Insufficient documentation

## 2013-04-26 HISTORY — DX: Depression, unspecified: F32.A

## 2013-04-26 HISTORY — DX: Other seasonal allergic rhinitis: J30.2

## 2013-04-26 HISTORY — DX: Personal history of other medical treatment: Z92.89

## 2013-04-26 LAB — CBC
HCT: 27.6 % — ABNORMAL LOW (ref 36.0–46.0)
Hemoglobin: 8.6 g/dL — ABNORMAL LOW (ref 12.0–15.0)
MCH: 18.9 pg — ABNORMAL LOW (ref 26.0–34.0)
MCHC: 31.2 g/dL (ref 30.0–36.0)
MCV: 60.8 fL — ABNORMAL LOW (ref 78.0–100.0)
Platelets: 315 10*3/uL (ref 150–400)
RBC: 4.54 MIL/uL (ref 3.87–5.11)
RDW: 18.9 % — ABNORMAL HIGH (ref 11.5–15.5)
WBC: 7.2 10*3/uL (ref 4.0–10.5)

## 2013-04-26 LAB — BASIC METABOLIC PANEL
BUN: 8 mg/dL (ref 6–23)
CO2: 25 mEq/L (ref 19–32)
Calcium: 9.6 mg/dL (ref 8.4–10.5)
Chloride: 103 mEq/L (ref 96–112)
Creatinine, Ser: 0.58 mg/dL (ref 0.50–1.10)
GFR calc Af Amer: >90 mL/min (ref >90)
GFR calc non Af Amer: >90 mL/min (ref >90)
Glucose, Bld: 232 mg/dL — ABNORMAL HIGH (ref 70–99)
Potassium: 4.3 mEq/L (ref 3.5–5.1)
Sodium: 140 mEq/L (ref 135–145)

## 2013-04-26 LAB — TYPE AND SCREEN
ABO/RH(D): O POS
Antibody Screen: NEGATIVE

## 2013-04-26 NOTE — Pre-Procedure Instructions (Signed)
Informed Dr Brayton Caves of patient's lab results from today's PAT appt.  HGB 8.6.- "Type & Crossmatch for 2 units DOS".  Patient's cgb was 232 today.  Per Dr Brayton Caves, patient needs to see primary care physician to be evaluated for high sugar levels prior to surgery.  Called Dr Cousin's office and informed Shanelle.

## 2013-04-26 NOTE — Patient Instructions (Addendum)
   Your procedure is scheduled on: Wednesday, 05/02/13  Enter through the Main Entrance of [facility] at:  7 am Pick up the phone at the desk and dial [phone] and inform us of your arrival.  Please call this number if you have any problems the morning of surgery: [phone]  Remember: Do not eat or drink after midnight: Tuesday Take these medicines the morning of surgery with a SIP OF WATER:  Depakote,  vimpat  Do not wear jewelry, make-up, or FINGER nail polish No metal in your hair or on your body. Do not wear lotions, powders, perfumes. You may wear deodorant.  Please use your CHG wash as directed prior to surgery.  Do not shave anywhere for at least 12 hours prior to first CHG shower.  Do not bring valuables to the hospital. Contacts, dentures or bridgework may not be worn into surgery.  Leave suitcase in the car. After Surgery it may be brought to your room. For patients being admitted to the hospital, checkout time is 11:00am the day of discharge.  Home with mother [name] and friend Casimiro Needle.

## 2013-04-27 ENCOUNTER — Other Ambulatory Visit (HOSPITAL_COMMUNITY): Payer: Self-pay

## 2013-04-27 LAB — HEMOGLOBIN A1C
Hgb A1c MFr Bld: 6.9 % — ABNORMAL HIGH (ref <5.7)
Mean Plasma Glucose: 151 mg/dL — ABNORMAL HIGH (ref <117)

## 2013-04-27 NOTE — Pre-Procedure Instructions (Signed)
Shanelle called from Dr Cherly Hensen office requesting that Hgb A1C be done from labs drawn yesterday if possible. I confirmed with lab that test can be done from specimen currently in lab. Order was entered for Hgb A1C

## 2013-04-30 ENCOUNTER — Encounter: Payer: Self-pay | Admitting: Hematology & Oncology

## 2013-05-01 MED ORDER — CLINDAMYCIN PHOSPHATE 900 MG/50ML IV SOLN
900.0000 mg | INTRAVENOUS | Status: AC
  Administered 2013-05-02: 900 mg via INTRAVENOUS
  Filled 2013-05-01: qty 50

## 2013-05-01 MED ORDER — CIPROFLOXACIN IN D5W 400 MG/200ML IV SOLN
400.0000 mg | INTRAVENOUS | Status: AC
  Administered 2013-05-02: 400 mg via INTRAVENOUS
  Filled 2013-05-01: qty 200

## 2013-05-02 ENCOUNTER — Encounter (HOSPITAL_COMMUNITY)
Admission: RE | Disposition: A | Discharge status: Home / Self Care | Payer: Self-pay | Source: Ambulatory Visit | Attending: Obstetrics and Gynecology

## 2013-05-02 ENCOUNTER — Encounter (HOSPITAL_COMMUNITY): Payer: Self-pay | Admitting: *Deleted

## 2013-05-02 ENCOUNTER — Ambulatory Visit (HOSPITAL_COMMUNITY): Payer: Managed Care, Other (non HMO) | Admitting: Anesthesiology

## 2013-05-02 ENCOUNTER — Encounter (HOSPITAL_COMMUNITY): Payer: Self-pay | Admitting: Anesthesiology

## 2013-05-02 ENCOUNTER — Ambulatory Visit (HOSPITAL_COMMUNITY)
Admission: RE | Admit: 2013-05-02 | Discharge: 2013-05-03 | Disposition: A | Discharge status: Home / Self Care | Payer: Managed Care, Other (non HMO) | Source: Ambulatory Visit | Attending: Obstetrics and Gynecology | Admitting: Obstetrics and Gynecology

## 2013-05-02 DIAGNOSIS — Z90710 Acquired absence of both cervix and uterus: Secondary | ICD-10-CM

## 2013-05-02 DIAGNOSIS — D500 Iron deficiency anemia secondary to blood loss (chronic): Secondary | ICD-10-CM | POA: Insufficient documentation

## 2013-05-02 DIAGNOSIS — N946 Dysmenorrhea, unspecified: Secondary | ICD-10-CM | POA: Insufficient documentation

## 2013-05-02 DIAGNOSIS — N920 Excessive and frequent menstruation with regular cycle: Secondary | ICD-10-CM | POA: Insufficient documentation

## 2013-05-02 DIAGNOSIS — D252 Subserosal leiomyoma of uterus: Secondary | ICD-10-CM | POA: Insufficient documentation

## 2013-05-02 DIAGNOSIS — E119 Type 2 diabetes mellitus without complications: Secondary | ICD-10-CM

## 2013-05-02 HISTORY — PX: UNILATERAL SALPINGECTOMY: SHX6160

## 2013-05-02 HISTORY — PX: ROBOTIC ASSISTED TOTAL HYSTERECTOMY: SHX6085

## 2013-05-02 LAB — GLUCOSE, CAPILLARY
Glucose-Capillary: 175 mg/dL — ABNORMAL HIGH (ref 70–99)
Glucose-Capillary: 235 mg/dL — ABNORMAL HIGH (ref 70–99)
Glucose-Capillary: 249 mg/dL — ABNORMAL HIGH (ref 70–99)
Glucose-Capillary: 251 mg/dL — ABNORMAL HIGH (ref 70–99)

## 2013-05-02 LAB — PREPARE RBC (CROSSMATCH)

## 2013-05-02 SURGERY — ROBOTIC ASSISTED TOTAL HYSTERECTOMY
Anesthesia: General | Site: Abdomen | Laterality: Right | Wound class: Clean Contaminated

## 2013-05-02 MED ORDER — MIDAZOLAM HCL 2 MG/2ML IJ SOLN
INTRAMUSCULAR | Status: AC
  Filled 2013-05-02: qty 2

## 2013-05-02 MED ORDER — FENTANYL CITRATE 0.05 MG/ML IJ SOLN
INTRAMUSCULAR | Status: AC
  Filled 2013-05-02: qty 5

## 2013-05-02 MED ORDER — PROPOFOL 10 MG/ML IV EMUL
INTRAVENOUS | Status: AC
  Filled 2013-05-02: qty 20

## 2013-05-02 MED ORDER — HYDROMORPHONE HCL PF 1 MG/ML IJ SOLN
INTRAMUSCULAR | Status: AC
  Filled 2013-05-02: qty 1

## 2013-05-02 MED ORDER — NEOSTIGMINE METHYLSULFATE 1 MG/ML IJ SOLN
INTRAMUSCULAR | Status: DC | PRN
  Administered 2013-05-02: 3 mg via INTRAVENOUS

## 2013-05-02 MED ORDER — ZOLPIDEM TARTRATE 5 MG PO TABS
5.0000 mg | ORAL_TABLET | Freq: Every evening | ORAL | Status: DC | PRN
  Administered 2013-05-03: 5 mg via ORAL
  Filled 2013-05-02: qty 1

## 2013-05-02 MED ORDER — ARTIFICIAL TEARS OP OINT
TOPICAL_OINTMENT | OPHTHALMIC | Status: AC
  Filled 2013-05-02: qty 3.5

## 2013-05-02 MED ORDER — PROPOFOL 10 MG/ML IV BOLUS
INTRAVENOUS | Status: DC | PRN
  Administered 2013-05-02: 200 mg via INTRAVENOUS

## 2013-05-02 MED ORDER — HYDROMORPHONE HCL PF 1 MG/ML IJ SOLN
INTRAMUSCULAR | Status: AC
  Administered 2013-05-02: 0.5 mg via INTRAVENOUS
  Filled 2013-05-02: qty 1

## 2013-05-02 MED ORDER — KETOROLAC TROMETHAMINE 30 MG/ML IJ SOLN
30.0000 mg | Freq: Four times a day (QID) | INTRAMUSCULAR | Status: DC
  Administered 2013-05-02: 30 mg via INTRAVENOUS
  Filled 2013-05-02 (×2): qty 1

## 2013-05-02 MED ORDER — GLYCOPYRROLATE 0.2 MG/ML IJ SOLN
INTRAMUSCULAR | Status: DC | PRN
  Administered 2013-05-02: .5 mg via INTRAVENOUS

## 2013-05-02 MED ORDER — DEXAMETHASONE SODIUM PHOSPHATE 10 MG/ML IJ SOLN
INTRAMUSCULAR | Status: AC
  Filled 2013-05-02: qty 1

## 2013-05-02 MED ORDER — FENTANYL CITRATE 0.05 MG/ML IJ SOLN
INTRAMUSCULAR | Status: AC
Start: 2013-05-02 — End: 2013-05-02
  Filled 2013-05-02: qty 5

## 2013-05-02 MED ORDER — DIVALPROEX SODIUM ER 500 MG PO TB24
500.0000 mg | ORAL_TABLET | Freq: Once | ORAL | Status: AC
  Administered 2013-05-02: 500 mg via ORAL
  Filled 2013-05-02: qty 1

## 2013-05-02 MED ORDER — BUPIVACAINE HCL (PF) 0.25 % IJ SOLN
INTRAMUSCULAR | Status: DC | PRN
  Administered 2013-05-02: 10 mL

## 2013-05-02 MED ORDER — LACTATED RINGERS IR SOLN
Status: DC | PRN
  Administered 2013-05-02: 3000 mL

## 2013-05-02 MED ORDER — KETOROLAC TROMETHAMINE 30 MG/ML IJ SOLN
INTRAMUSCULAR | Status: DC | PRN
  Administered 2013-05-02: 30 mg via INTRAVENOUS
  Administered 2013-05-02: 30 mg via INTRAMUSCULAR

## 2013-05-02 MED ORDER — ONDANSETRON HCL 4 MG PO TABS
4.0000 mg | ORAL_TABLET | Freq: Four times a day (QID) | ORAL | Status: DC | PRN
  Administered 2013-05-02: 4 mg via ORAL
  Filled 2013-05-02: qty 1

## 2013-05-02 MED ORDER — MIDAZOLAM HCL 2 MG/2ML IJ SOLN: 0.5000 mg | Freq: Once | INTRAMUSCULAR | Status: DC | PRN

## 2013-05-02 MED ORDER — IBUPROFEN 800 MG PO TABS
800.0000 mg | ORAL_TABLET | Freq: Three times a day (TID) | ORAL | Status: DC | PRN
  Administered 2013-05-02: 800 mg via ORAL
  Filled 2013-05-02: qty 1

## 2013-05-02 MED ORDER — LIDOCAINE HCL (CARDIAC) 20 MG/ML IV SOLN
INTRAVENOUS | Status: AC
  Filled 2013-05-02: qty 5

## 2013-05-02 MED ORDER — FENTANYL CITRATE 0.05 MG/ML IJ SOLN
INTRAMUSCULAR | Status: DC | PRN
  Administered 2013-05-02 (×5): 50 ug via INTRAVENOUS
  Administered 2013-05-02: 100 ug via INTRAVENOUS
  Administered 2013-05-02 (×3): 50 ug via INTRAVENOUS

## 2013-05-02 MED ORDER — DIVALPROEX SODIUM ER 500 MG PO TB24
500.0000 mg | ORAL_TABLET | Freq: Two times a day (BID) | ORAL | Status: DC
  Administered 2013-05-03: 500 mg via ORAL
  Filled 2013-05-02 (×2): qty 1

## 2013-05-02 MED ORDER — DIVALPROEX SODIUM ER 500 MG PO TB24: 500.0000 mg | ORAL_TABLET | Freq: Two times a day (BID) | ORAL | Status: DC

## 2013-05-02 MED ORDER — LACOSAMIDE 50 MG PO TABS: 100.0000 mg | ORAL_TABLET | Freq: Two times a day (BID) | ORAL | Status: DC

## 2013-05-02 MED ORDER — LACOSAMIDE 50 MG PO TABS
100.0000 mg | ORAL_TABLET | Freq: Once | ORAL | Status: AC
  Administered 2013-05-02: 100 mg via ORAL
  Filled 2013-05-02: qty 2

## 2013-05-02 MED ORDER — KETOROLAC TROMETHAMINE 30 MG/ML IJ SOLN
INTRAMUSCULAR | Status: AC
  Filled 2013-05-02: qty 2

## 2013-05-02 MED ORDER — ROCURONIUM BROMIDE 100 MG/10ML IV SOLN
INTRAVENOUS | Status: DC | PRN
  Administered 2013-05-02: 50 mg via INTRAVENOUS
  Administered 2013-05-02: 20 mg via INTRAVENOUS

## 2013-05-02 MED ORDER — SODIUM CHLORIDE 0.9 % IV SOLN
1020.0000 mg | Freq: Once | INTRAVENOUS | Status: AC
  Administered 2013-05-02: 1020 mg via INTRAVENOUS
  Filled 2013-05-02: qty 34

## 2013-05-02 MED ORDER — MIDAZOLAM HCL 2 MG/2ML IJ SOLN
INTRAMUSCULAR | Status: DC | PRN
  Administered 2013-05-02: 2 mg via INTRAVENOUS

## 2013-05-02 MED ORDER — LACOSAMIDE 50 MG PO TABS
100.0000 mg | ORAL_TABLET | Freq: Two times a day (BID) | ORAL | Status: DC
  Administered 2013-05-03: 100 mg via ORAL
  Filled 2013-05-02: qty 2

## 2013-05-02 MED ORDER — ONDANSETRON HCL 4 MG/2ML IJ SOLN
INTRAMUSCULAR | Status: AC
  Filled 2013-05-02: qty 2

## 2013-05-02 MED ORDER — ONDANSETRON HCL 4 MG/2ML IJ SOLN: 4.0000 mg | Freq: Four times a day (QID) | INTRAMUSCULAR | Status: DC | PRN

## 2013-05-02 MED ORDER — MEPERIDINE HCL 25 MG/ML IJ SOLN: 6.2500 mg | INTRAMUSCULAR | Status: DC | PRN

## 2013-05-02 MED ORDER — ONDANSETRON HCL 4 MG/2ML IJ SOLN
INTRAMUSCULAR | Status: DC | PRN
  Administered 2013-05-02: 4 mg via INTRAVENOUS

## 2013-05-02 MED ORDER — PROMETHAZINE HCL 25 MG/ML IJ SOLN: 6.2500 mg | INTRAMUSCULAR | Status: DC | PRN

## 2013-05-02 MED ORDER — SODIUM CHLORIDE 0.9 % IJ SOLN
INTRAMUSCULAR | Status: DC | PRN
  Administered 2013-05-02: 7 mL

## 2013-05-02 MED ORDER — BUPIVACAINE HCL (PF) 0.25 % IJ SOLN
INTRAMUSCULAR | Status: AC
  Filled 2013-05-02: qty 30

## 2013-05-02 MED ORDER — MENTHOL 3 MG MT LOZG: 1.0000 | LOZENGE | OROMUCOSAL | Status: DC | PRN

## 2013-05-02 MED ORDER — HYDROMORPHONE HCL PF 1 MG/ML IJ SOLN
0.2000 mg | INTRAMUSCULAR | Status: DC | PRN
  Administered 2013-05-02 – 2013-05-03 (×2): 0.6 mg via INTRAVENOUS
  Filled 2013-05-02 (×2): qty 1

## 2013-05-02 MED ORDER — LIDOCAINE HCL (CARDIAC) 20 MG/ML IV SOLN
INTRAVENOUS | Status: DC | PRN
  Administered 2013-05-02: 80 mg via INTRAVENOUS

## 2013-05-02 MED ORDER — LACTATED RINGERS IV SOLN
INTRAVENOUS | Status: DC
  Administered 2013-05-02 (×2): via INTRAVENOUS

## 2013-05-02 MED ORDER — PANTOPRAZOLE SODIUM 40 MG PO TBEC
40.0000 mg | DELAYED_RELEASE_TABLET | Freq: Every day | ORAL | Status: DC
  Administered 2013-05-03: 40 mg via ORAL
  Filled 2013-05-02 (×2): qty 1

## 2013-05-02 MED ORDER — HYDROMORPHONE HCL PF 1 MG/ML IJ SOLN
0.2500 mg | INTRAMUSCULAR | Status: DC | PRN
  Administered 2013-05-02 (×4): 0.5 mg via INTRAVENOUS

## 2013-05-02 MED ORDER — KETOROLAC TROMETHAMINE 30 MG/ML IJ SOLN: 30.0000 mg | Freq: Four times a day (QID) | INTRAMUSCULAR | Status: DC

## 2013-05-02 MED ORDER — FLUTICASONE PROPIONATE 50 MCG/ACT NA SUSP: 1.0000 | Freq: Every day | NASAL | Status: DC | PRN

## 2013-05-02 MED ORDER — INSULIN ASPART 100 UNIT/ML ~~LOC~~ SOLN
4.0000 [IU] | Freq: Once | SUBCUTANEOUS | Status: AC
  Administered 2013-05-02: 4 [IU] via SUBCUTANEOUS

## 2013-05-02 MED ORDER — LACTATED RINGERS IV SOLN
INTRAVENOUS | Status: DC
  Administered 2013-05-02: 14:00:00 via INTRAVENOUS

## 2013-05-02 MED ORDER — DEXAMETHASONE SODIUM PHOSPHATE 10 MG/ML IJ SOLN
INTRAMUSCULAR | Status: DC | PRN
  Administered 2013-05-02: 10 mg via INTRAVENOUS

## 2013-05-02 MED ORDER — OXYCODONE-ACETAMINOPHEN 5-325 MG PO TABS
1.0000 | ORAL_TABLET | ORAL | Status: DC | PRN
  Administered 2013-05-02 – 2013-05-03 (×3): 2 via ORAL
  Filled 2013-05-02 (×3): qty 2

## 2013-05-02 MED ORDER — FOLIC ACID 1 MG PO TABS
2.0000 mg | ORAL_TABLET | Freq: Every day | ORAL | Status: DC
  Administered 2013-05-03: 2 mg via ORAL
  Filled 2013-05-02 (×2): qty 2

## 2013-05-02 SURGICAL SUPPLY — 59 items
ADH SKN CLS APL DERMABOND .7 (GAUZE/BANDAGES/DRESSINGS) ×3
BAG URINE DRAINAGE (UROLOGICAL SUPPLIES) ×4 IMPLANT
CATH FOLEY LF 3WAY 5CC16FR (CATHETERS) ×2 IMPLANT
CHLORAPREP W/TINT 26ML (MISCELLANEOUS) ×4 IMPLANT
CLOTH BEACON ORANGE TIMEOUT ST (SAFETY) ×4 IMPLANT
CONT PATH 16OZ SNAP LID 3702 (MISCELLANEOUS) ×4 IMPLANT
COVER MAYO STAND STRL (DRAPES) ×4 IMPLANT
COVER TABLE BACK 60X90 (DRAPES) ×8 IMPLANT
COVER TIP SHEARS 8 DVNC (MISCELLANEOUS) ×3 IMPLANT
COVER TIP SHEARS 8MM DA VINCI (MISCELLANEOUS) ×1
DECANTER SPIKE VIAL GLASS SM (MISCELLANEOUS) ×4 IMPLANT
DERMABOND ADVANCED (GAUZE/BANDAGES/DRESSINGS) ×1
DERMABOND ADVANCED .7 DNX12 (GAUZE/BANDAGES/DRESSINGS) ×3 IMPLANT
DRAPE HUG U DISPOSABLE (DRAPE) ×4 IMPLANT
DRAPE LG THREE QUARTER DISP (DRAPES) ×8 IMPLANT
DRAPE WARM FLUID 44X44 (DRAPE) ×4 IMPLANT
ELECT REM PT RETURN 9FT ADLT (ELECTROSURGICAL) ×8
ELECTRODE REM PT RTRN 9FT ADLT (ELECTROSURGICAL) ×4 IMPLANT
EVACUATOR SMOKE 8.L (FILTER) ×4 IMPLANT
GLOVE BIOGEL PI IND STRL 7.0 (GLOVE) ×20 IMPLANT
GLOVE BIOGEL PI INDICATOR 7.0 (GLOVE) ×16
GLOVE SURG SS PI 6.0 STRL IVOR (GLOVE) ×18 IMPLANT
GLOVE SURG SS PI 6.5 STRL IVOR (GLOVE) ×20 IMPLANT
GLOVE SURG SS PI 7.0 STRL IVOR (GLOVE) ×16 IMPLANT
GOWN STRL REIN XL XLG (GOWN DISPOSABLE) ×26 IMPLANT
KIT ACCESSORY DA VINCI DISP (KITS) ×1
KIT ACCESSORY DVNC DISP (KITS) ×3 IMPLANT
LEGGING LITHOTOMY PAIR STRL (DRAPES) ×4 IMPLANT
NDL INSUFFLATION 14GA 120MM (NEEDLE) ×2 IMPLANT
NEEDLE INSUFFLATION 14GA 120MM (NEEDLE) ×4 IMPLANT
OCCLUDER COLPOPNEUMO (BALLOONS) ×2 IMPLANT
PACK LAVH (CUSTOM PROCEDURE TRAY) ×4 IMPLANT
PAD OB MATERNITY 4.3X12.25 (PERSONAL CARE ITEMS) ×2 IMPLANT
PAD PREP 24X48 CUFFED NSTRL (MISCELLANEOUS) ×8 IMPLANT
PLUG CATH AND CAP STER (CATHETERS) ×4 IMPLANT
PROTECTOR NERVE ULNAR (MISCELLANEOUS) ×8 IMPLANT
SET CYSTO W/LG BORE CLAMP LF (SET/KITS/TRAYS/PACK) ×2 IMPLANT
SET IRRIG TUBING LAPAROSCOPIC (IRRIGATION / IRRIGATOR) ×4 IMPLANT
SOLUTION ELECTROLUBE (MISCELLANEOUS) ×4 IMPLANT
SUT VIC AB 0 CT1 36 (SUTURE) ×8 IMPLANT
SUT VICRYL 0 UR6 27IN ABS (SUTURE) ×4 IMPLANT
SUT VICRYL 4-0 PS2 18IN ABS (SUTURE) ×8 IMPLANT
SUT VLOC 180 0 9IN  GS21 (SUTURE) ×4
SUT VLOC 180 0 9IN GS21 (SUTURE) ×10 IMPLANT
SYR 50ML LL SCALE MARK (SYRINGE) ×4 IMPLANT
SYRINGE 10CC LL (SYRINGE) ×4 IMPLANT
TIP UTERINE 6.7X8CM BLUE DISP (MISCELLANEOUS) ×2 IMPLANT
TOWEL OR 17X24 6PK STRL BLUE (TOWEL DISPOSABLE) ×14 IMPLANT
TRAY FOLEY BAG SILVER LF 16FR (CATHETERS) ×2 IMPLANT
TROCAR 12M 150ML BLUNT (TROCAR) ×2 IMPLANT
TROCAR 5M 150ML BLDLS (TROCAR) ×2 IMPLANT
TROCAR DISP BLADELESS 8 DVNC (TROCAR) ×1 IMPLANT
TROCAR DISP BLADELESS 8MM (TROCAR) ×1
TROCAR XCEL 12X100 BLDLESS (ENDOMECHANICALS) ×4 IMPLANT
TROCAR XCEL NON-BLD 5MMX100MML (ENDOMECHANICALS) ×4 IMPLANT
TROCAR Z-THREAD 12X150 (TROCAR) ×4 IMPLANT
TUBING FILTER THERMOFLATOR (ELECTROSURGICAL) ×4 IMPLANT
WARMER LAPAROSCOPE (MISCELLANEOUS) ×4 IMPLANT
WATER STERILE IRR 1000ML POUR (IV SOLUTION) ×12 IMPLANT

## 2013-05-02 NOTE — Anesthesia Preprocedure Evaluation (Signed)
Anesthesia Evaluation  Patient identified by MRN, date of birth, ID band Patient awake    Reviewed: Allergy & Precautions, H&P , Patient's Chart, lab work & pertinent test results, reviewed documented beta blocker date and time   History of Anesthesia Complications Negative for: history of anesthetic complications  Airway Mallampati: III TM Distance: >3 FB Neck ROM: full    Dental no notable dental hx.    Pulmonary neg pulmonary ROS,  breath sounds clear to auscultation  Pulmonary exam normal       Cardiovascular Exercise Tolerance: Good + Peripheral Vascular Disease negative cardio ROS  Rhythm:regular Rate:Normal     Neuro/Psych  Headaches, Seizures -,  PSYCHIATRIC DISORDERS Depression negative neurological ROS  negative psych ROS   GI/Hepatic negative GI ROS, Neg liver ROS,   Endo/Other  negative endocrine ROSdiabetes  Renal/GU Renal diseasenegative Renal ROS     Musculoskeletal   Abdominal   Peds  Hematology negative hematology ROS (+) anemia ,   Anesthesia Other Findings Thalassemia  Reproductive/Obstetrics negative OB ROS                           Anesthesia Physical Anesthesia Plan  ASA: III  Anesthesia Plan: General ETT   Post-op Pain Management:    Induction:   Airway Management Planned:   Additional Equipment:   Intra-op Plan:   Post-operative Plan:   Informed Consent: I have reviewed the patients History and Physical, chart, labs and discussed the procedure including the risks, benefits and alternatives for the proposed anesthesia with the patient or authorized representative who has indicated his/her understanding and acceptance.   Dental Advisory Given  Plan Discussed with: CRNA and Surgeon  Anesthesia Plan Comments:         Anesthesia Quick Evaluation

## 2013-05-02 NOTE — Transfer of Care (Signed)
Immediate Anesthesia Transfer of Care Note  Patient: Abigail Hall  Procedure(s) Performed: Procedure(s): ROBOTIC ASSISTED TOTAL HYSTERECTOMY (N/A) UNILATERAL SALPINGECTOMY (Right)  Patient Location: PACU  Anesthesia Type:General  Level of Consciousness: awake, alert  and oriented  Airway & Oxygen Therapy: Patient Spontanous Breathing and Patient connected to nasal cannula oxygen  Post-op Assessment: Report given to PACU RN, Post -op Vital signs reviewed and stable and Patient moving all extremities X 4  Post vital signs: Reviewed and stable  Complications: No apparent anesthesia complications

## 2013-05-02 NOTE — Anesthesia Postprocedure Evaluation (Signed)
  Anesthesia Post-op Note  Patient: Abigail Hall  Procedure(s) Performed: Procedure(s): ROBOTIC ASSISTED TOTAL HYSTERECTOMY (N/A) UNILATERAL SALPINGECTOMY (Right)  Patient Location: [facility]  Anesthesia Type:General  Level of Consciousness: awake, alert  and oriented  Airway and Oxygen Therapy: Patient Spontanous Breathing  Post-op Pain: none  Post-op Assessment: Post-op Vital signs reviewed and Patient's Cardiovascular Status Stable  Post-op Vital Signs: Reviewed and stable  Complications: No apparent anesthesia complications

## 2013-05-02 NOTE — Anesthesia Postprocedure Evaluation (Signed)
  Anesthesia Post Note  Patient: Abigail Hall  Procedure(s) Performed: Procedure(s) (LRB): ROBOTIC ASSISTED TOTAL HYSTERECTOMY (N/A) UNILATERAL SALPINGECTOMY (Right)  Anesthesia type: GA  Patient location: PACU  Post pain: Pain level controlled  Post assessment: Post-op Vital signs reviewed  Last Vitals:  Filed Vitals:   05/02/13 0753  BP: 126/66  Pulse: 74  Temp: 36.5 C  Resp: 20    Post vital signs: Reviewed  Level of consciousness: sedated  Complications: No apparent anesthesia complications

## 2013-05-02 NOTE — OR Nursing (Signed)
Pt's extremities and position assessed at 0950.  Pt's extremities and position appropriate for surgical procedure and within normal limits.

## 2013-05-02 NOTE — Progress Notes (Signed)
Pt with cbg elevation, Dr Cherly Hensen and Dr. Jean Rosenthal made aware, pt was given decadron in the or. Dr cousins will order cbg's on the floor

## 2013-05-02 NOTE — Preoperative (Signed)
Beta Blockers   Reason not to administer Beta Blockers:Not Applicable 

## 2013-05-02 NOTE — Brief Op Note (Signed)
05/02/2013  11:51 AM  PATIENT:  Abigail Hall  32 y.o. female  PRE-OPERATIVE DIAGNOSIS:  History of endometrial ablation, Menorrhagia, Dysmenorrhea , severe anemia, previous cesarean section  POST-OPERATIVE DIAGNOSIS:  History of endometrial ablation, Menorrhagia, Dysmenorrhea, severe anemia, previous cesarean section  PROCEDURE:  Procedure(s): ROBOTIC ASSISTED TOTAL HYSTERECTOMY (N/A) UNILATERAL SALPINGECTOMY (Right)  SURGEON:  Surgeon(s) and Role:    * [name], MD - Primary  PHYSICIAN ASSISTANT:   ASSISTANTS: Marlinda Mike, CNM   ANESTHESIA:   general  EBL:  Total I/O In: 1300 [I.V.:1300] Out: 200 [Urine:150; Blood:50]  BLOOD ADMINISTERED:none  DRAINS: none   LOCAL MEDICATIONS USED:  MARCAINE     SPECIMEN:  Source of Specimen:  uterus, right tube  DISPOSITION OF SPECIMEN:  PATHOLOGY  COUNTS:  YES  TOURNIQUET:  * No tourniquets in log *  DICTATION: .Other Dictation: Dictation Number [id number]  PLAN OF CARE: Admit for overnight observation  PATIENT DISPOSITION:  PACU - hemodynamically stable.   Delay start of Pharmacological VTE agent (>24hrs) due to surgical blood loss or risk of bleeding: no

## 2013-05-02 NOTE — Progress Notes (Signed)
Dr. Billy Coast notified of CBG 251, was informed to call Dr. Cherly Hensen.  No new order given

## 2013-05-03 ENCOUNTER — Encounter (HOSPITAL_COMMUNITY): Payer: Self-pay | Admitting: Obstetrics and Gynecology

## 2013-05-03 LAB — CBC
HCT: 25.5 % — ABNORMAL LOW (ref 36.0–46.0)
Hemoglobin: 8.1 g/dL — ABNORMAL LOW (ref 12.0–15.0)
MCH: 19 pg — ABNORMAL LOW (ref 26.0–34.0)
MCHC: 31.8 g/dL (ref 30.0–36.0)
MCV: 59.9 fL — ABNORMAL LOW (ref 78.0–100.0)
Platelets: 298 10*3/uL (ref 150–400)
RBC: 4.26 MIL/uL (ref 3.87–5.11)
RDW: 17.9 % — ABNORMAL HIGH (ref 11.5–15.5)
WBC: 11.6 10*3/uL — ABNORMAL HIGH (ref 4.0–10.5)

## 2013-05-03 LAB — BASIC METABOLIC PANEL
BUN: 9 mg/dL (ref 6–23)
CO2: 24 mEq/L (ref 19–32)
Calcium: 9.7 mg/dL (ref 8.4–10.5)
Chloride: 101 mEq/L (ref 96–112)
Creatinine, Ser: 0.59 mg/dL (ref 0.50–1.10)
GFR calc Af Amer: >90 mL/min (ref >90)
GFR calc non Af Amer: >90 mL/min (ref >90)
Glucose, Bld: 164 mg/dL — ABNORMAL HIGH (ref 70–99)
Potassium: 4.2 mEq/L (ref 3.5–5.1)
Sodium: 136 mEq/L (ref 135–145)

## 2013-05-03 LAB — GLUCOSE, CAPILLARY: Glucose-Capillary: 168 mg/dL — ABNORMAL HIGH (ref 70–99)

## 2013-05-03 MED ORDER — HYDROMORPHONE HCL 4 MG PO TABS: 4.0000 mg | ORAL_TABLET | ORAL | Status: DC | PRN

## 2013-05-03 MED ORDER — SIMETHICONE 80 MG PO CHEW
80.0000 mg | CHEWABLE_TABLET | Freq: Four times a day (QID) | ORAL | Status: DC
  Administered 2013-05-03: 80 mg via ORAL

## 2013-05-03 MED ORDER — SIMETHICONE 80 MG PO CHEW: 80.0000 mg | CHEWABLE_TABLET | Freq: Four times a day (QID) | ORAL | Status: DC

## 2013-05-03 MED ORDER — METFORMIN HCL 500 MG PO TABS: 500.0000 mg | ORAL_TABLET | Freq: Every evening | ORAL | Status: DC

## 2013-05-03 MED ORDER — OXYCODONE-ACETAMINOPHEN 5-325 MG PO TABS: 1.0000 | ORAL_TABLET | ORAL | Status: DC | PRN

## 2013-05-03 MED ORDER — IBUPROFEN 800 MG PO TABS: 800.0000 mg | ORAL_TABLET | Freq: Three times a day (TID) | ORAL | Status: DC | PRN

## 2013-05-03 MED ORDER — HYDROMORPHONE HCL 2 MG PO TABS
4.0000 mg | ORAL_TABLET | ORAL | Status: DC | PRN
  Administered 2013-05-03: 4 mg via ORAL
  Filled 2013-05-03: qty 2

## 2013-05-03 NOTE — Progress Notes (Addendum)
Unable to contact Dr Cherly Hensen.  Dr Billy Coast called at [phone] no new orders, stated need to page  Dr. Cherly Hensen and he gave the pager number.  Dr. Cherly Hensen paged x [phone], no response.

## 2013-05-03 NOTE — Progress Notes (Signed)
Subjective: Patient reports tolerating PO, + flatus and no problems voiding.    Objective: I have reviewed patient's vital signs.  vital signs, intake and output and labs. Filed Vitals:   05/03/13 0553  BP: 120/63  Pulse: 89  Temp: 97.9 F (36.6 C)  Resp: 20   I/O last 3 completed shifts: In: 5481.7 [P.O.:3000; I.V.:2481.7] Out: 3550 [Urine:3500; Blood:50]    Lab Results  Component Value Date   WBC 11.6* 05/03/2013   HGB 8.1* 05/03/2013   HCT 25.5* 05/03/2013   MCV 59.9* 05/03/2013   PLT 298 05/03/2013   Lab Results  Component Value Date   CREATININE 0.59 05/03/2013    EXAM General: alert, cooperative and no distress Resp: clear to auscultation bilaterally Cardio: regular rate and rhythm, S1, S2 normal, no murmur, click, rub or gallop GI: soft, non-tender; bowel sounds normal; no masses,  no organomegaly and incision: clean, dry and intact Extremities: no edema, redness or tenderness in the calves or thighs Vaginal Bleeding: none  Assessment: s/p Procedure(s):DAVINCI ROBOTIC ASSISTED TOTAL HYSTERECTOMY UNILATERAL SALPINGECTOMY: stable, progressing well, tolerating diet and anemia 2) Type 2 Diabetes aggravated by IV decadron given at the time of surgery start  Plan: Advance diet Encourage ambulation Discharge home  LOS: 1 day  D/c instructions reviewed.   Allyanna Appleman A, MD 05/03/2013 8:37 AM    05/03/2013, 8:37 AM

## 2013-05-03 NOTE — Plan of Care (Signed)
Problem: Consults Goal: Diabetes Guidelines if Diabetic/Glucose > 140 If diabetic or lab glucose is > 140 mg/dl - Initiate Diabetes/Hyperglycemia Guidelines & Document Interventions  Outcome: Not Progressing Per Dr. Cherly Hensen we did not give sliding scale insulin for elevated CBG levels. Patient has been given a prescription for Metformin to start taking at home.  Patient is to make a follow-up appt. with MD about surgery and plan of care for diabetes.

## 2013-05-03 NOTE — Op Note (Signed)
NAME:  Abigail Hall, PLAYER NO.:  [id number]  MEDICAL RECORD NO.:  [id number]  LOCATION:  9317                          FACILITY:  [facility]  PHYSICIAN:  Maxie Better, M.D.DATE OF BIRTH:  1980/10/22  DATE OF PROCEDURE:  05/02/2013 DATE OF DISCHARGE:                              OPERATIVE REPORT   PREOPERATIVE DIAGNOSIS:  Menorrhagia, dysmenorrhea, history of endometrial ablation, severe anemia, previous cesarean section.  PROCEDURE:  Da Vinci robotic total abdominal hysterectomy, right salpingectomy.  POSTOPERATIVE DIAGNOSIS:  History of endometrial ablation, menorrhagia, dysmenorrhea, severe anemia, previous cesarean section.  ANESTHESIA:  General.  SURGEON:  Maxie Better, MD.  ASSISTANTMarlinda Mike, C.N.M.  DESCRIPTION OF PROCEDURE:  Under adequate general anesthesia, the patient was placed in the dorsal lithotomy position.  She was sterilely prepped and draped in usual fashion.  A three-way Foley catheter was sterilely placed.  A large weighted speculum was placed in the vagina. Sims retractor was used anteriorly.  The cervix had a 0 Vicryl figure-of- eight suture was placed on the anterior and posterior lip of the cervix. The cervix was then serially dilated up to #31 [facility] dilator.  Uterus sounded to 9 cm.  A #8 uterine manipulator with a small RUMI cup was introduced into the uterine cavity without incident.  A weighted speculum and retractor were removed.  Attention was then turned to the abdomen.  An 0.25% Marcaine was injected in supraumbilical site.  A supraumbilical incision was then made.  Veress needle was introduced, tested with normal saline, 3 L of CO2 was insufflated.  Veress needle was then removed.  The 10 mm disposable trocar with sleeve was introduced into the abdomen without incident.  The robotic camera was inserted and pelvis was inspected.  There was some upper abdominal scar tissue.  The patient was placed in Trendelenburg  position.  The uterus had small fibroids.  Both ovaries were slightly enlarged, but otherwise normal.  The left side had some adhesions of the ovary to the left sidewall and to the proximal bowel. On the right, the distal end of the fallopian tube could be seen which is the fimbriated end.  On the left, the tube could not be seen.  Additional robotic port sites were placed: 2 on the left and 1 on the right, and a 5-mm assistant port was placed in the right lower quadrant.  The robot was then docked to the patient's left side. However, we had difficulty with initial docking due to the patient's large body habitus, and her pannus, abdominal girth, so the abdomen was partially deflated, less Trendelenburg was used, and subsequently the robot was able to be docked to the patient's left side.  In arm#1 Monopolar scissors, arm #2 PK dissector, arm #3, the Prograsp was then placed.  I then went to the surgical console.  At the surgical console, the pelvis was further inspected.  Both ureters could be seen peristalsing well deep in the pelvis.  On the left side, the blunt to sharp dissection was done to remove the ovary off of the left side as well as the proximal attachments.  There was no evidence of a left tube. The distal end of the right tube was noted.  The procedure was started by removing the fimbriated remaining portion of fallopian tube.  The retroperitoneal space was opened bilaterally.  The right utero-ovarian ligament was serially clamped, cauterized, and cut.  Round ligament was then serially clamped, cauterized, and cut.  The anterior leaf of the broad ligament was opened.  The anterior vesicouterine peritoneum was opened transversely and the bladder was then sharply dissected of the lower uterine segment.  Uterine vessels on the right was then cauterized and then cut.  Attention was then turned to the opposite side where the left retroperitoneal space was opened.  The left  utero-ovarian ligament was then clamped, cauterized, and cut.  Round ligament was clamped, cauterized, and cut.  It was then noted that there was a posterior cystic appearing mass in the left inferior aspect of the uterus.  The remaining bladder was further dissected off the core ring.  Opening the area cystic structure was then noted that this was actually the balloon which had displaced a very thin uterine wall and was posterior to the uterine vessels.  The attempted repositioning the balloon into the uterus was not successful.  The balloon was deflated allow for the uterine vessels could be seen, and to be clamped, cauterized, and then cut.  Once this was done, the vesicouterine peritoneum being displaced distally, the  cervicovaginal junction was then opened circumferentially and the uterus was then removed through the vagina.  The small piece of fallopian tube was then removed as well.  Good hemostasis was noted.  The monopolar scissors and the PK dissector were replaced by the large needle driver and the long- Tip forceps.  A 0 V-Loc suture was then inserted and the vaginal cuff was then closed. Digital exploration of the vaginal cuff showed good approximation.  Abdomen was irrigated and suctioned.  Small bleeders cauterized.  The needle was then removed. The robotic instruments removed and the robot was then undocked.  I then went back to the patient's bedside.  Pelvis was inspected.  Good hemostasis noted.  The robotic instruments were then removed under direct visualization and the incisions were closed with 4-0 Vicryl subcuticular stitch of the small incisions.  Fascia  in the supraumbilical site  Closed with 0 Vicryl, and skin approximated with 4-0 Vicryl subcuticular stitch.  SPECIMEN:  Uterus with cervix and right tube also sent to Pathology.  ESTIMATED BLOOD LOSS:  50 mL.  INTRAOPERATIVE FLUIDS:  1300 mL.  URINE OUTPUT:  150 mL concentrated urine.  Sponge and  instrument counts x2 was correct.  COMPLICATION:  None.  The patient tolerated the procedure well, was transferred to recovery in stable condition.     Maxie Better, M.D.     [place]/MEDQ  D:  05/03/2013  T:  05/03/2013  Job:  [id number]

## 2013-05-03 NOTE — Discharge Summary (Signed)
Physician Discharge Summary  Patient ID: Abigail Hall MRN: [id number] DOB/AGE: 1981-06-16 32 y.o.  Admit date: 05/02/2013 Discharge date: 05/03/2013  Admission Diagnoses: menorrhagia, dysmenorrhea, severe anemia, hx beta thalassemia minor, seizure disorder, diabetes, previous cesarean section  Discharge Diagnoses: menorrhagia, severe anemia, beta thalassemia minor, seizure disorder, uterine fibroid, diabetes, previous cesarean section  Active problem: seizure disorder, diabetes( type 2)  beta thalassemia minor, morbid obesity  Discharged Condition: stable  Hospital Course: Pt was diagnosed with diabetes during preop lab evaluation and was cleared for surgery by her internist. Pt had a hx of previous cesarean section, endometrial ablation and TL. She was also seen prior to admission by heme/onc for evaluation and management of her severe anemia. Pt was taken to OR where she underwent Davinci robotic total hysterectomy, right salpingectomy. Postop course was notable only for elevated BS for which she was given insulin coverage. Unfortunately the pt had received IV decadron at the time of surgery which has markedly influence the degree of BS elevation. She was given IV Feraheme. Her blood loss was minimal. She is being discharged on metformin. Pt was continued on her seizure medications  Consults: None  Significant Diagnostic Studies: labs:  CBC    Component Value Date/Time   WBC 11.6* 05/03/2013 0530   WBC 9.4 04/17/2013 1233   RBC 4.26 05/03/2013 0530   RBC 4.68 04/17/2013 1233   HGB 8.1* 05/03/2013 0530   HGB 9.0* 04/17/2013 1233   HCT 25.5* 05/03/2013 0530   HCT 28.1* 04/17/2013 1233   PLT 298 05/03/2013 0530   PLT 313 04/17/2013 1233   MCV 59.9* 05/03/2013 0530   MCV 62* 04/17/2013 1233   MCH 19.0* 05/03/2013 0530   MCH 19.8* 04/17/2013 1233   MCHC 31.8 05/03/2013 0530   MCHC 32.0 04/17/2013 1233   RDW 17.9* 05/03/2013 0530   RDW 20.5* 04/17/2013 1233   LYMPHSABS 3.9* 04/17/2013 1233   LYMPHSABS  3.3 03/02/2013 2330   MONOABS 0.8 03/02/2013 2330   EOSABS 0.1 04/17/2013 1233   EOSABS 0.2 03/02/2013 2330   BASOSABS 0.0 04/17/2013 1233   BASOSABS 0.0 03/02/2013 2330   BMET    Component Value Date/Time   NA 136 05/03/2013 0530   K 4.2 05/03/2013 0530   CL 101 05/03/2013 0530   CO2 24 05/03/2013 0530   GLUCOSE 164* 05/03/2013 0530   BUN 9 05/03/2013 0530   CREATININE 0.59 05/03/2013 0530   CREATININE 0.53 11/04/2011 1800   CALCIUM 9.7 05/03/2013 0530   GFRNONAA >90 05/03/2013 0530   GFRAA >90 05/03/2013 0530       Treatments: insulin: regular: surgery: davinci robotic total hysterectomy, right salpingectomy  Discharge Exam: Blood pressure 120/63, pulse 89, temperature 97.9 F (36.6 C), temperature source Oral, resp. rate 20, height 5\' 4"  (1.626 m), weight 124.739 kg (275 lb), last menstrual period 04/25/2013, SpO2 99.00%. General appearance: alert, cooperative and no distress Back: (-)CVAT Resp: clear to auscultation bilaterally GI: soft, non-tender; bowel sounds normal; no masses,  no organomegaly and obese Pelvic: deferred Extremities: no edema, redness or tenderness in the calves or thighs Incision/Wound: incisions well approximated, no erythema or induration  Disposition: 01-Home or Self Care  Discharge Orders   Future Orders Complete By Expires   Diet Carb Modified  As directed    Discharge instructions  As directed    Comments:     Call if temperature greater than equal to 100.4, nothing per vagina for 4-6 weeks or severe nausea vomiting, increased incisional pain , drainage  or redness in the incision site, no straining with bowel movements, showers no bath   Discharge patient  As directed    Increase activity slowly  As directed    May walk up steps  As directed    No wound care  As directed        Medication List         divalproex 500 MG 24 hr tablet  Commonly known as:  DEPAKOTE ER  Take 500 mg by mouth 2 (two) times daily. Must be brand     fluticasone 50  MCG/ACT nasal spray  Commonly known as:  FLONASE  Place 1 spray into the nose daily as needed for allergies.     folic acid 1 MG tablet  Commonly known as:  FOLVITE  Take 2 tablets (2 mg total) by mouth daily.     ibuprofen 800 MG tablet  Commonly known as:  ADVIL,MOTRIN  Take 1 tablet (800 mg total) by mouth every 8 (eight) hours as needed (mild pain).     lacosamide 50 MG Tabs tablet  Commonly known as:  VIMPAT  Take 100 mg by mouth 2 (two) times daily.     metFORMIN 500 MG tablet  Commonly known as:  GLUCOPHAGE  Take 1 tablet (500 mg total) by mouth every evening.     oxyCODONE-acetaminophen 5-325 MG per tablet  Commonly known as:  PERCOCET/ROXICET  Take 1-2 tablets by mouth every 4 (four) hours as needed.     prenatal multivitamin Tabs tablet  Take 1 tablet by mouth daily at 12 noon.     vitamin C 500 MG tablet  Commonly known as:  ASCORBIC ACID  Take 500 mg by mouth daily.     zolpidem 5 MG tablet  Commonly known as:  AMBIEN  Take 2 tablets (10 mg total) by mouth at bedtime as needed. As needed for insomnia      Dilaudid 4mg  po q 4 hr prn script given Cancelled percocet script     Follow-up Information   Follow up with Zalma Channing A, MD In 2 weeks.   Specialty:  Obstetrics and Gynecology   Contact information:   [address] [place] [address] [phone]       Signed: Maxie Better A 05/03/2013, 8:37 AM

## 2013-05-03 NOTE — Plan of Care (Signed)
Problem: Discharge Progression Outcomes Goal: Barriers To Progression Addressed/Resolved Outcome: Progressing Patient working on controlling diabetes with diet control and exercise.

## 2013-05-04 NOTE — Addendum Note (Signed)
Addendum created 05/04/13 0700 by Turner Daniels, CRNA   Modules edited: Anesthesia Medication Administration

## 2013-05-06 LAB — TYPE AND SCREEN
ABO/RH(D): O POS
Antibody Screen: NEGATIVE
Unit division: 0
Unit division: 0

## 2013-05-09 ENCOUNTER — Encounter: Payer: Self-pay | Admitting: Hematology & Oncology

## 2013-05-14 ENCOUNTER — Encounter (INDEPENDENT_AMBULATORY_CARE_PROVIDER_SITE_OTHER): Payer: Self-pay | Admitting: Surgery

## 2013-05-14 ENCOUNTER — Ambulatory Visit (INDEPENDENT_AMBULATORY_CARE_PROVIDER_SITE_OTHER): Payer: Managed Care, Other (non HMO) | Admitting: Surgery

## 2013-05-14 VITALS — BP 106/58 | HR 105 | Temp 97.0°F | Ht 64.0 in | Wt 278.0 lb

## 2013-05-14 DIAGNOSIS — K648 Other hemorrhoids: Secondary | ICD-10-CM

## 2013-05-14 DIAGNOSIS — K5909 Other constipation: Secondary | ICD-10-CM

## 2013-05-14 DIAGNOSIS — K5900 Constipation, unspecified: Secondary | ICD-10-CM

## 2013-05-14 DIAGNOSIS — K644 Residual hemorrhoidal skin tags: Secondary | ICD-10-CM

## 2013-05-14 DIAGNOSIS — E669 Obesity, unspecified: Secondary | ICD-10-CM | POA: Insufficient documentation

## 2013-05-14 MED ORDER — POLYETHYLENE GLYCOL 3350 17 G PO PACK: 17.0000 g | PACK | Freq: Two times a day (BID) | ORAL | Status: DC

## 2013-05-14 MED ORDER — POLYETHYLENE GLYCOL 3350 17 GM/SCOOP PO POWD: 17.0000 g | Freq: Two times a day (BID) | ORAL | Status: DC

## 2013-05-14 NOTE — Progress Notes (Signed)
Subjective:     Patient ID: Abigail Hall, female   DOB: December 09, 1980, 32 y.o.   MRN: [id number]  HPI  Abigail Hall  09-May-1981 [id number]  Patient Care Team: [name], MD as PCP - General (Obstetrics and Gynecology) [name], MD as Consulting Physician (Internal Medicine) [name], MD as Consulting Physician (Gastroenterology)  This patient is a 32 y.o.female who presents today for surgical evaluation at the request of Dr. Cherly Hensen.   Reason for visit: Painful hemorrhoids  Is a morbidly obese female struggled with hemorrhoids.  I saw her 18 months ago.  She was pregnant.  She had hemorrhoid flare.  I recommended warm soaks on a bowel regimen.  Then reevaluate after pregnancy to see if something more aggressive such as banding or surgery needed to be done.  Most of her symptoms seem to be related to her external hemorrhoids.  Pain and discomfort.  She has also struggled with some rectal bleeding.  She claims she has had a colonoscopy in the past that was normal.  Since I saw her in 2013, she has required numerous pelvic operations.  Now has a hysterectomy.  She is almost 2 weeks out from that.  She still struggles with hemorrhoids.  Pain and discomfort.  She still has severe constipation.  Takes MiraLAX often.  She recalls some medication that worked great for her in the spring.  She cannot recall the name.  She was hoping I could look that up.  I have not had any success.  Patient Active Problem List   Diagnosis Date Noted  . Microcytic anemia 03/02/2013  . UTI (urinary tract infection) 03/02/2013  . Anemia, iron deficiency 04/10/2012  . Hemorrhoids, internal, with bleeding 12/13/2011  . External hemorrhoids with pain 12/13/2011  . Anemia 12/13/2011  . Beta thalassemia minor 07/15/2011  . Medication exposure during first trimester of pregnancy 07/15/2011    Past Medical History  Diagnosis Date  . Hemorrhoids   . Rectal bleeding   . Constipation    . Nausea & vomiting     resolved after delivery - C/S  . Rectal pain   . Weakness     resolved after C/S delivery  . Generalized headaches   . Anemia   . Thalassemia   . Diabetes mellitus     gestational DM 2013 pregnancy-resolved  . Anemia, iron deficiency 04/10/2012  . Depression 04/26/13    history - no current problems  . Seasonal allergies   . Seizures     last seizure was  08/2012  . History of blood transfusion 02/2013    [facility]  2 units transfused     Past Surgical History  Procedure Laterality Date  . Tongue surgery    . Carpal tunnel release  01/2009    right  . Wisdom tooth extraction    . Cesarean section  12/31/2011    Procedure: CESAREAN SECTION;  Surgeon: Delbert Harness, MD;  Location: [facility];  Service: Gynecology;  Laterality: N/A;  . Laparoscopic tubal ligation  03/27/2012    Procedure: LAPAROSCOPIC TUBAL LIGATION;  Surgeon: Delbert Harness, MD;  Location: [facility];  Service: Gynecology;  Laterality: Bilateral;  fallopian tubes caurtery  . Labioplasty  03/27/2012    Procedure: LABIAPLASTY;  Surgeon: Delbert Harness, MD;  Location: [facility];  Service: Gynecology;  Laterality: N/A;  labia  . Tubal ligation    . Laparoscopy      removal of cyst  . Dilation  and curettage of uterus      endometrial ablation  . Abdominal hysterectomy    . Robotic assisted total hysterectomy N/A 05/02/2013    Procedure: ROBOTIC ASSISTED TOTAL HYSTERECTOMY;  Surgeon: [name], MD;  Location: [facility];  Service: Gynecology;  Laterality: N/A;  . Unilateral salpingectomy Right 05/02/2013    Procedure: UNILATERAL SALPINGECTOMY;  Surgeon: [name], MD;  Location: [facility];  Service: Gynecology;  Laterality: Right;    History   Social History  . Marital Status: Single    Spouse Name: N/A    Number of Children: N/A  . Years of Education: N/A   Occupational History  . Not on file.   Social History Main Topics  . Smoking status: Never Smoker   . Smokeless tobacco:  Never Used  . Alcohol Use: No  . Drug Use: No  . Sexual Activity: Yes    Birth Control/ Protection: None   Other Topics Concern  . Not on file   Social History Narrative  . No narrative on file    Family History  Problem Relation Age of Onset  . Anesthesia problems Neg Hx   . Hypotension Neg Hx   . Malignant hyperthermia Neg Hx   . Pseudochol deficiency Neg Hx   . Diabetes Mother   . Hyperlipidemia Father   . Diabetes Maternal Grandmother   . Cancer Maternal Grandfather   . Hyperlipidemia Paternal Grandfather     Current Outpatient Prescriptions  Medication Sig Dispense Refill  . divalproex (DEPAKOTE ER) 500 MG 24 hr tablet Take 500 mg by mouth 2 (two) times daily. Must be brand      . fluticasone (FLONASE) 50 MCG/ACT nasal spray Place 1 spray into the nose daily as needed for allergies.       . folic acid (FOLVITE) 1 MG tablet Take 2 tablets (2 mg total) by mouth daily.  60 tablet  12  . HYDROmorphone (DILAUDID) 4 MG tablet Take 1 tablet (4 mg total) by mouth every 4 (four) hours as needed.  30 tablet  0  . ibuprofen (ADVIL,MOTRIN) 800 MG tablet Take 1 tablet (800 mg total) by mouth every 8 (eight) hours as needed (mild pain).  30 tablet  1  . lacosamide (VIMPAT) 50 MG TABS Take 100 mg by mouth 2 (two) times daily.      . metFORMIN (GLUCOPHAGE) 500 MG tablet Take 1 tablet (500 mg total) by mouth every evening.  30 tablet  5  . oxyCODONE-acetaminophen (PERCOCET/ROXICET) 5-325 MG per tablet Take 1-2 tablets by mouth every 4 (four) hours as needed.  30 tablet  0  . Prenatal Vit-Fe Fumarate-FA (PRENATAL MULTIVITAMIN) TABS tablet Take 1 tablet by mouth daily at 12 noon.      . simethicone (MYLICON) 80 MG chewable tablet Chew 1 tablet (80 mg total) by mouth 4 (four) times daily.  30 tablet  0  . vitamin C (ASCORBIC ACID) 500 MG tablet Take 500 mg by mouth daily.      Marland Kitchen zolpidem (AMBIEN) 5 MG tablet Take 2 tablets (10 mg total) by mouth at bedtime as needed. As needed for insomnia   30 tablet  0   No current facility-administered medications for this visit.     Allergies  Allergen Reactions  . Latex Itching, Swelling and Rash    Where touched.  . Keflex [Cephalexin] Hives    BP 106/58  Pulse 105  Temp(Src) 97 F (36.1 C) (Temporal)  Ht 5\' 4"  (1.626 m)  Wt 278 lb (126.1 kg)  BMI 47.7 kg/m2  SpO2 97%  LMP 04/25/2013  No results found.   Review of Systems  Constitutional: Negative for fever, chills, diaphoresis, appetite change and fatigue.  HENT: Negative for ear pain, sore throat, trouble swallowing, neck pain and ear discharge.   Eyes: Negative for photophobia, discharge and visual disturbance.  Respiratory: Negative for cough, choking, chest tightness and shortness of breath.   Cardiovascular: Negative for chest pain and palpitations.  Gastrointestinal: Positive for constipation, anal bleeding and rectal pain. Negative for nausea, vomiting, abdominal pain and diarrhea.  Endocrine: Negative for cold intolerance and heat intolerance.  Genitourinary: Negative for dysuria, frequency and difficulty urinating.  Musculoskeletal: Negative for myalgias and gait problem.  Skin: Negative for color change, pallor and rash.  Allergic/Immunologic: Negative for environmental allergies, food allergies and immunocompromised state.  Neurological: Negative for dizziness, speech difficulty, weakness and numbness.  Hematological: Negative for adenopathy.  Psychiatric/Behavioral: Negative for confusion and agitation. The patient is not nervous/anxious.        Objective:   Physical Exam  Constitutional: She is oriented to person, place, and time. She appears well-developed and well-nourished. No distress.  HENT:  Head: Normocephalic.  Mouth/Throat: Oropharynx is clear and moist. No oropharyngeal exudate.  Eyes: Conjunctivae and EOM are normal. Pupils are equal, round, and reactive to light. No scleral icterus.  Neck: Normal range of motion. Neck supple. No tracheal  deviation present.  Cardiovascular: Normal rate, regular rhythm and intact distal pulses.   Pulmonary/Chest: Effort normal and breath sounds normal. No stridor. No respiratory distress. She exhibits no tenderness.  Abdominal: Soft. She exhibits no distension and no mass. There is no tenderness. Hernia confirmed negative in the right inguinal area and confirmed negative in the left inguinal area.  Genitourinary: No vaginal discharge found.  Exam done with assistance of female Medical Assistant in the room.  Perianal skin clean with good hygiene.  No pruritis.  No pilonidal disease.  Small anterior midline fissure.  No abscess/fistula.    R ant/posterior external hemorrhoids enlarged & sensitive.  Tolerates digital and anoscopic rectal exam.  Normal sphincter tone.  No rectal masses.  Hemorrhoidal piles enlarged R anterior > Posterior.  L int hem WNL   Musculoskeletal: Normal range of motion. She exhibits no tenderness.       Right elbow: She exhibits normal range of motion.       Left elbow: She exhibits normal range of motion.       Right wrist: She exhibits normal range of motion.       Left wrist: She exhibits normal range of motion.       Right hand: Normal strength noted.       Left hand: Normal strength noted.  Lymphadenopathy:       Head (right side): No posterior auricular adenopathy present.       Head (left side): No posterior auricular adenopathy present.    She has no cervical adenopathy.    She has no axillary adenopathy.       Right: No inguinal adenopathy present.       Left: No inguinal adenopathy present.  Neurological: She is alert and oriented to person, place, and time. No cranial nerve deficit. She exhibits normal muscle tone. Coordination normal.  Skin: Skin is warm and dry. No rash noted. She is not diaphoretic. No erythema.  Psychiatric: She has a normal mood and affect. Her behavior is normal. Judgment and thought content normal.  Assessment:     Worsening  hemorrhoids in the setting of severe chronic constipation.     Plan:     This point, I think the external hemorrhoids are too sensitive to manage in the office.  Moderately enlarged hemorrhoids internally as well.  I think she would benefit from surgery.  I recommended THD ligation For the internal hemorrhoids and external hemorrhoidectomy and the remaining tissue:  The anatomy & physiology of the anorectal region was discussed.  The pathophysiology of hemorrhoids and differential diagnosis was discussed.  Natural history risks without surgery was discussed.   I stressed the importance of a bowel regimen to have daily soft bowel movements to minimize progression of disease.  Interventions such as sclerotherapy & banding were discussed.  The patient's symptoms are not adequately controlled by medicines and other non-operative treatments.  I feel the risks & problems of no surgery outweigh the operative risks; therefore, I recommended surgery to treat the hemorrhoids by ligation, pexy, and possible resection.  Risks such as bleeding, infection, urinary difficulties, need for further treatment, heart attack, death, and other risks were discussed.   I noted a good likelihood this will help address the problem.  Goals of post-operative recovery were discussed as well.  Possibility that this will not correct all symptoms was explained.  Post-operative pain, bleeding, constipation, and other problems after surgery were discussed.  We will work to minimize complications.   Educational handouts further explaining the pathology, treatment options, and bowel regimen were given as well.  Questions were answered.  The patient expresses understanding & wishes to proceed with surgery.  I strongly recommend she get back on a bowel regimen.  She recalls some medication given to her last year that worked well as a prescription.  She cannot remember what it is. She is hoping I knew.   Eventually after coming around it  seems like it was just MiraLAX.  She wished packets.  I tried to light.  Perhaps she needs to see Dr. Elnoria Howard to help sort out constipation.  I would increase her MiraLAX to four times a day as needed (double dose twice a day) until she is having one soft bowel movement a day.  I stressed to her that until her severe constipation is under control, the hemorrhoidectomy will be just a temporary help.

## 2013-05-14 NOTE — Patient Instructions (Signed)
HEMORRHOIDS  The rectum is the last foot of your colon, and it naturally stretches to hold stool.  Hemorrhoidal piles are natural clusters of blood vessels that help the rectum and anal canal stretch to hold stool and allow bowel movements to eliminate feces.   Hemorrhoids are abnormally swollen blood vessels in the rectum.  Too much pressure in the rectum causes hemorrhoids by forcing blood to stretch and bulge the walls of the veins, sometimes even rupturing them.  Hemorrhoids can become like varicose veins you might see on a person's legs.  Most people will develop a flare of hemorrhoids in their lifetime.  When bulging hemorrhoidal veins are irritated, they can swell, burn, itch, cause pain, and bleed.  Most flares will calm down gradually own within a few weeks.  However, once hemorrhoids are created, they are difficult to get rid of completely and tend to flare more easily than the first flare.   Fortunately, good habits and simple medical treatment usually control hemorrhoids well, and surgery is needed only in severe cases. Types of Hemorrhoids:  Internal hemorrhoids usually don't initially hurt or itch; they are deep inside the rectum and usually have no sensation. If they begin to push out (prolapse), pain and burning can occur.  However, internal hemorrhoids can bleed.  Anal bleeding should not be ignored since bleeding could come from a dangerous source like colorectal cancer, so persistent rectal bleeding should be investigated by a doctor, sometimes with a colonoscopy.  External hemorrhoids cause most of the symptoms - pain, burning, and itching. Nonirritated hemorrhoids can look like small skin tags coming out of the anus.   Thrombosed hemorrhoids can form when a hemorrhoid blood vessel bursts and causes the hemorrhoid to suddenly swell.  A purple blood clot can form in it and become an excruciatingly painful lump at the anus. Because of these unpleasant symptoms, immediate incision and  drainage by a surgeon at an office visit can provide much relief of the pain.    PREVENTION Avoiding the most frequent causes listed below will prevent most cases of hemorrhoids: Constipation Hard stools Diarrhea  Constant sitting  Straining with bowel movements Sitting on the toilet for a long time  Severe coughing  episodes Pregnancy / Childbirth  Heavy Lifting  Sometimes avoiding the above triggers is difficult:  How can you avoid sitting all day if you have a seated job? Also, we try to avoid coughing and diarrhea, but sometimes it's beyond your control.  Still, there are some practical hints to help: Keep the anal and genital area clean.  Moistened tissues such as flushable wet wipes are less irritating than toilet paper.  Using irrigating showers or bottle irrigation washing gently cleans this sensitive area.   Avoid dry toilet paper when cleaning after bowel movements.  Marland Kitchen Keep the anal and genital area dry.  Lightly pat the rectal area dry.  Avoid rubbing.  Talcum or baby powders can help GET YOUR STOOLS SOFT.   This is the most important way to prevent irritated hemorrhoids.  Hard stools are like sandpaper to the anorectal canal and will cause more problems.  The goal: ONE SOFT BOWEL MOVEMENT A DAY!  BMs from every other day to 3 times a day is a tolerable range Treat coughing, diarrhea and constipation early since irritated hemorrhoids may soon follow.  If your main job activity is seated, always stand or walk during your breaks. Make it a point to stand and walk at least 5 minutes every hour  and try to shift frequently in your chair to avoid direct rectal pressure.  Always exhale as you strain or lift. Don't hold your breath.  Do not delay or try to prevent a bowel movement when the urge is present. Exercise regularly (walking or jogging 60 minutes a day) to stimulate the bowels to move. No reading or other activity while on the toilet. If bowel movements take longer than 5 minutes,  you are too constipated. AVOID CONSTIPATION Drink plenty of liquids (1 1/2 to 2 quarts of water and other fluids a day unless fluid restricted for another medical condition). Liquids that contain caffeine (coffee a, tea, soft drinks) can be dehydrating and should be avoided until constipation is controlled. Consider minimizing milk, as dairy products may be constipating. Eat plenty of fiber (30g a day ideal, more if needed).  Fiber is the undigested part of plant food that passes into the colon, acting as "natures broom" to encourage bowel motility and movement.  Fiber can absorb and hold large amounts of water. This results in a larger, bulkier stool, which is soft and easier to pass.  Eating foods high in fiber - 12 servings - such as  Vegetables: Root (potatoes, carrots, turnips), Leafy green (lettuce, salad greens, celery, spinach), High residue (cabbage, broccoli, etc.) Fruit: Fresh, Dried (prunes, apricots, cherries), Stewed (applesauce)  Whole grain breads, pasta, whole wheat Bran cereals, muffins, etc. Consider adding supplemental bulking fiber which retains large volumes of water: Psyllium ground seeds (native plant from [place])--available as Metamucil, Konsyl, Effersyllium, Per Diem Fiber, or the less expensive generic forms.  Citrucel  (methylcellulose wood fiber) . FiberCon (Polycarbophil) Polyethylene Glycol - and "artificial" fiber commonly called Miralax or Glycolax.  It is helpful for people with gassy or bloated feelings with regular fiber Flax Seed - a less gassy natural fiber  Laxatives can be useful for a short period if constipation is severe Osmotics (Milk of Magnesia, Fleets Phospho-Soda, Magnesium Citrate)  Stimulants (Senokot,   Castor Oil,  Dulcolax, Ex-Lax)    Laxatives are not a good long-term solution as it can stress the bowels and cause too much mineral loss and dehydration.   Avoid taking laxatives for more than 7 days in a row.  AVOID DIARRHEA Switch to  liquids and simpler foods for a few days to avoid stressing your intestines further. Avoid dairy products (especially milk & ice cream) for a short time.  The intestines often can lose the ability to digest lactose when stressed. Avoid foods that cause gassiness or bloating.  Typical foods include beans and other legumes, cabbage, broccoli, and dairy foods.  Every person has some sensitivity to other foods, so listen to your body and avoid those foods that trigger problems for you. Adding fiber (Citrucel, Metamucil, FiberCon, Flax seed, Miralax) gradually can help thicken stools by absorbing excess fluid and retrain the intestines to act more normally.  Slowly increase the dose over a few weeks.  Too much fiber too soon can backfire and cause cramping & bloating. Probiotics (such as active yogurt, Align, etc) may help repopulate the intestines and colon with normal bacteria and calm down a sensitive digestive tract.  Most studies show it to be of mild help, though, and such products can be costly. Medicines: Bismuth subsalicylate (ex. Kayopectate, Pepto Bismol) every 30 minutes for up to 6 doses can help control diarrhea.  Avoid if pregnant. Loperamide (Immodium) can slow down diarrhea.  Start with two tablets (57m total) first and then try one tablet  every 6 hours.  Avoid if you are having fevers or severe pain.  If you are not better or start feeling worse, stop all medicines and call your doctor for advice Call your doctor if you are getting worse or not better.  Sometimes further testing (cultures, endoscopy, X-ray studies, bloodwork, etc) may be needed to help diagnose and treat the cause of the diarrhea. TREATMENT OF HEMORRHOID FLARE If these preventive measures fail, you must take action right away! Hemorrhoids are one condition that can be mild in the morning and become intolerable by nightfall. Most hemorrhoidal flares take several weeks to calm down.  These suggestions can help: Warm soaks.   This helps more than any topical medication.  Use up to 8 times a day.  Usually sitz baths or sitting in a warm bathtub helps.  Sitting on moist warm towels are helpful.  Switching to ice packs/cool compresses can be helpful Normalize your bowels.  Extremes of diarrhea or constipation will make hemorrhoids worse.  One soft bowel movement a day is the goal.  Fiber can help get your bowels regular Wet wipes instead of toilet paper Pain control with a NSAID such as ibuprofen (Advil) or naproxen (Aleve) or acetaminophen (Tylenol) around the clock.  Narcotics are constipating and should be minimized if possible Topical creams contain steroids (bydrocortisone) or local anesthetic (xylocaine) can help make pain and itching more tolerable.   EVALUATION If hemorrhoids are still causing problems, you could benefit by an evaluation by a surgeon.  The surgeon will obtain a history and examine you.  If hemorrhoids are diagnosed, some therapies can be offered in the office, usually with an anoscope into the less sensitive area of the rectum: -injection of hemorrhoids (sclerotherapy) can scar the blood vessels of the swollen/enlarged hemorrhoids to help shrink them down to a more normal size -rubber banding of the enlarged hemorrhoids to help shrink them down to a more normal size -drainage of the blood clot causing a thrombosed hemorrhoid,  to relieve the severe pain   While 90% of the time such problems from hemorrhoids can be managed without preceding to surgery, sometimes the hemorrhoids require a operation to control the problem (uncontrolled bleeding, prolapse, pain, etc.).   This involves being placed under general anesthesia where the surgeon can confirm the diagnosis and remove, suture, or staple the hemorrhoid(s).  Your surgeon can help you treat the problem appropriately.    GETTING TO GOOD BOWEL HEALTH. Irregular bowel habits such as constipation and diarrhea can lead to many problems over time.  Having  one soft bowel movement a day is the most important way to prevent further problems.  The anorectal canal is designed to handle stretching and feces to safely manage our ability to get rid of solid waste (feces, poop, stool) out of our body.  BUT, hard constipated stools can act like ripping concrete bricks and diarrhea can be a burning fire to this very sensitive area of our body, causing inflamed hemorrhoids, anal fissures, increasing risk is perirectal abscesses, abdominal pain/bloating, an making irritable bowel worse.     The goal: ONE SOFT BOWEL MOVEMENT A DAY!  To have soft, regular bowel movements:    Drink at least 8 tall glasses of water a day.     Take plenty of fiber.  Fiber is the undigested part of plant food that passes into the colon, acting s "natures broom" to encourage bowel motility and movement.  Fiber can absorb and hold large amounts of water.  This results in a larger, bulkier stool, which is soft and easier to pass. Work gradually over several weeks up to 6 servings a day of fiber (25g a day even more if needed) in the form of: o Vegetables -- Root (potatoes, carrots, turnips), leafy green (lettuce, salad greens, celery, spinach), or cooked high residue (cabbage, broccoli, etc) o Fruit -- Fresh (unpeeled skin & pulp), Dried (prunes, apricots, cherries, etc ),  or stewed ( applesauce)  o Whole grain breads, pasta, etc (whole wheat)  o Bran cereals    Bulking Agents -- This type of water-retaining fiber generally is easily obtained each day by one of the following:  o Psyllium bran -- The psyllium plant is remarkable because its ground seeds can retain so much water. This product is available as Metamucil, Konsyl, Effersyllium, Per Diem Fiber, or the less expensive generic preparation in drug and health food stores. Although labeled a laxative, it really is not a laxative.  o Methylcellulose -- This is another fiber derived from wood which also retains water. It is available as  Citrucel. o Polyethylene Glycol - and "artificial" fiber commonly called Miralax or Glycolax.  It is helpful for people with gassy or bloated feelings with regular fiber o Flax Seed - a less gassy fiber than psyllium   No reading or other relaxing activity while on the toilet. If bowel movements take longer than 5 minutes, you are too constipated   AVOID CONSTIPATION.  High fiber and water intake usually takes care of this.  Sometimes a laxative is needed to stimulate more frequent bowel movements, but    Laxatives are not a good long-term solution as it can wear the colon out. o Osmotics (Milk of Magnesia, Fleets phosphosoda, Magnesium citrate, MiraLax, GoLytely) are safer than  o Stimulants (Senokot, Castor Oil, Dulcolax, Ex Lax)    o Do not take laxatives for more than 7days in a row.    IF SEVERELY CONSTIPATED, try a Bowel Retraining Program: o Do not use laxatives.  o Eat a diet high in roughage, such as bran cereals and leafy vegetables.  o Drink six (6) ounces of prune or apricot juice each morning.  o Eat two (2) large servings of stewed fruit each day.  o Take one (1) heaping tablespoon of a psyllium-based bulking agent twice a day. Use sugar-free sweetener when possible to avoid excessive calories.  o Eat a normal breakfast.  o Set aside 15 minutes after breakfast to sit on the toilet, but do not strain to have a bowel movement.  o If you do not have a bowel movement by the third day, use an enema and repeat the above steps.    Controlling diarrhea o Switch to liquids and simpler foods for a few days to avoid stressing your intestines further. o Avoid dairy products (especially milk & ice cream) for a short time.  The intestines often can lose the ability to digest lactose when stressed. o Avoid foods that cause gassiness or bloating.  Typical foods include beans and other legumes, cabbage, broccoli, and dairy foods.  Every person has some sensitivity to other foods, so listen to our  body and avoid those foods that trigger problems for you. o Adding fiber (Citrucel, Metamucil, psyllium, Miralax) gradually can help thicken stools by absorbing excess fluid and retrain the intestines to act more normally.  Slowly increase the dose over a few weeks.  Too much fiber too soon can backfire and cause cramping & bloating. o Probiotics (  such as active yogurt, Align, etc) may help repopulate the intestines and colon with normal bacteria and calm down a sensitive digestive tract.  Most studies show it to be of mild help, though, and such products can be costly. o Medicines:   Bismuth subsalicylate (ex. Kayopectate, Pepto Bismol) every 30 minutes for up to 6 doses can help control diarrhea.  Avoid if pregnant.   Loperamide (Immodium) can slow down diarrhea.  Start with two tablets (4mg  total) first and then try one tablet every 6 hours.  Avoid if you are having fevers or severe pain.  If you are not better or start feeling worse, stop all medicines and call your doctor for advice o Call your doctor if you are getting worse or not better.  Sometimes further testing (cultures, endoscopy, X-ray studies, bloodwork, etc) may be needed to help diagnose and treat the cause of the diarrhea.  ANORECTAL SURGERY: POST OP INSTRUCTIONS  1. Take your usually prescribed home medications unless otherwise directed. 2. DIET: Follow a light bland diet the first 24 hours after arrival home, such as soup, liquids, crackers, etc.  Be sure to include lots of fluids daily.  Avoid fast food or heavy meals as your are more likely to get nauseated.  Eat a low fat the next few days after surgery.   3. PAIN CONTROL: a. Pain is best controlled by a usual combination of three different methods TOGETHER: i. Ice/Heat ii. Over the counter pain medication iii. Prescription pain medication b. Most patients will experience some swelling and discomfort in the anus/rectal area. and incisions.  Ice packs or heat (30-60 minutes up  to 6 times a day) will help. Use ice for the first few days to help decrease swelling and bruising, then switch to heat such as warm towels, sitz baths, warm baths, etc to help relax tight/sore spots and speed recovery.  Some people prefer to use ice alone, heat alone, alternating between ice & heat.  Experiment to what works for you.  Swelling and bruising can take several weeks to resolve.   c. It is helpful to take an over-the-counter pain medication regularly for the first few weeks.  Choose one of the following that works best for you: i. Naproxen (Aleve, etc)  Two 220mg  tabs twice a day ii. Ibuprofen (Advil, etc) Three 200mg  tabs four times a day (every meal & bedtime) iii. Acetaminophen (Tylenol, etc) 500-650mg  four times a day (every meal & bedtime) d. A  prescription for pain medication (such as oxycodone, hydrocodone, etc) should be given to you upon discharge.  Take your pain medication as prescribed.  i. If you are having problems/concerns with the prescription medicine (does not control pain, nausea, vomiting, rash, itching, etc), please call us [phone] to see if we need to switch you to a different pain medicine that will work better for you and/or control your side effect better. ii. If you need a refill on your pain medication, please contact your pharmacy.  They will contact our office to request authorization. Prescriptions will not be filled after 5 pm or on week-ends. 4. KEEP YOUR BOWELS REGULAR a. The goal is one bowel movement a day b. Avoid getting constipated.  Between the surgery and the pain medications, it is common to experience some constipation.  Increasing fluid intake and taking a fiber supplement (such as Metamucil, Citrucel, FiberCon, MiraLax, etc) 1-2 times a day regularly will usually help prevent this problem from occurring.  A mild laxative (prune juice,  Milk of Magnesia, MiraLax, etc) should be taken according to package directions if there are no bowel  movements after 48 hours. c. Watch out for diarrhea.  If you have many loose bowel movements, simplify your diet to bland foods & liquids for a few days.  Stop any stool softeners and decrease your fiber supplement.  Switching to mild anti-diarrheal medications (Kayopectate, Pepto Bismol) can help.  If this worsens or does not improve, please call us.  5. Wound Care a. Remove your bandages the day after surgery.  Unless discharge instructions indicate otherwise, leave your bandage dry and in place overnight.  Remove the bandage during your first bowel movement.   b. Allow the wound packing to fall out over the next few days.  You can trim exposed gauze / ribbon as it falls out.  You do not need to repack the wound unless instructed otherwise.  Wear an absorbent pad or soft cotton gauze in your underwear as needed to catch any drainage and help keep the area  c. Keep the area clean and dry.  Bathe / shower every day.  Keep the area clean by showering / bathing over the incision / wound.   It is okay to soak an open wound to help wash it.  Wet wipes or showers / gentle washing after bowel movements is often less traumatic than regular toilet paper. d. Bonita Quin may have some styrofoam-like soft packing in the rectum which will come out with the first bowel movement.  e. You will often notice bleeding with bowel movements.  This should slow down by the end of the first week of surgery f. Expect some drainage.  This should slow down, too, by the end of the first week of surgery.  Wear an absorbent pad or soft cotton gauze in your underwear until the drainage stops. 6. ACTIVITIES as tolerated:   a. You may resume regular (light) daily activities beginning the next day-such as daily self-care, walking, climbing stairs-gradually increasing activities as tolerated.  If you can walk 30 minutes without difficulty, it is safe to try more intense activity such as jogging, treadmill, bicycling, low-impact aerobics,  swimming, etc. b. Save the most intensive and strenuous activity for last such as sit-ups, heavy lifting, contact sports, etc  Refrain from any heavy lifting or straining until you are off narcotics for pain control.   c. DO NOT PUSH THROUGH PAIN.  Let pain be your guide: If it hurts to do something, don't do it.  Pain is your body warning you to avoid that activity for another week until the pain goes down. d. You may drive when you are no longer taking prescription pain medication, you can comfortably sit for long periods of time, and you can safely maneuver your car and apply brakes. e. Bonita Quin may have sexual intercourse when it is comfortable.  7. FOLLOW UP in our office a. Please call [facility] at [phone] to set up an appointment to see your surgeon in the office for a follow-up appointment approximately 2 weeks after your surgery. b. Make sure that you call for this appointment the day you arrive home to insure a convenient appointment time. 10. IF YOU HAVE DISABILITY OR FAMILY LEAVE FORMS, BRING THEM TO THE OFFICE FOR PROCESSING.  DO NOT GIVE THEM TO YOUR DOCTOR.        WHEN TO CALL us [phone]: 1. Poor pain control 2. Reactions / problems with new medications (rash/itching, nausea, etc)  3. Fever over  101.5 F (38.5 C) 4. Inability to urinate 5. Nausea and/or vomiting 6. Worsening swelling or bruising 7. Continued bleeding from incision. 8. Increased pain, redness, or drainage from the incision  The clinic staff is available to answer your questions during regular business hours (8:30am-5pm).  Please don't hesitate to call and ask to speak to one of our nurses for clinical concerns.   A surgeon from [facility] is always on call at the hospitals   If you have a medical emergency, go to the nearest emergency room or call 911.    [facility] [address], [place], [place]  [address] ? MAIN: [phone] ? TOLL FREE:  [phone] ? FAX [phone] [email or web address]

## 2013-05-23 ENCOUNTER — Encounter (HOSPITAL_COMMUNITY): Payer: Self-pay | Admitting: Pharmacy Technician

## 2013-05-24 NOTE — Patient Instructions (Addendum)
20 Abigail Hall  05/24/2013   Your procedure is scheduled on: 05/31/13  Report to [facility] at 9:45 AM.  Call this number if you have problems the morning of surgery 336-: [phone]   Remember:   Do not eat food or drink liquids After Midnight.     Take these medicines the morning of surgery with A SIP OF WATER: depakote, vimpat   Do not wear jewelry, make-up or nail polish.  Do not wear lotions, powders, or perfumes. You may wear deodorant.  Do not shave 48 hours prior to surgery. Men may shave face and neck.  Do not bring valuables to the hospital.     Patients discharged the day of surgery will not be allowed to drive home.  Name and phone number of your driver: [name] [phone]    Birdie Sons, RN  pre op nurse call if needed [phone]    FAILURE TO FOLLOW THESE INSTRUCTIONS MAY RESULT IN CANCELLATION OF YOUR SURGERY   Patient Signature: ___________________________________________

## 2013-05-24 NOTE — Progress Notes (Signed)
Chest x-ray 02/27/13 on [facility], EKG 02/26/13 on [facility]

## 2013-05-25 ENCOUNTER — Encounter (HOSPITAL_COMMUNITY): Payer: Self-pay

## 2013-05-25 ENCOUNTER — Encounter (HOSPITAL_COMMUNITY)
Admission: RE | Admit: 2013-05-25 | Discharge: 2013-05-25 | Disposition: A | Discharge status: Home / Self Care | Payer: Managed Care, Other (non HMO) | Source: Ambulatory Visit | Attending: Surgery | Admitting: Surgery

## 2013-05-25 DIAGNOSIS — Z01812 Encounter for preprocedural laboratory examination: Secondary | ICD-10-CM | POA: Insufficient documentation

## 2013-05-25 LAB — BASIC METABOLIC PANEL
BUN: 10 mg/dL (ref 6–23)
CO2: 24 mEq/L (ref 19–32)
Calcium: 9.6 mg/dL (ref 8.4–10.5)
Chloride: 102 mEq/L (ref 96–112)
Creatinine, Ser: 0.67 mg/dL (ref 0.50–1.10)
GFR calc Af Amer: >90 mL/min (ref >90)
GFR calc non Af Amer: >90 mL/min (ref >90)
Glucose, Bld: 114 mg/dL — ABNORMAL HIGH (ref 70–99)
Potassium: 4.2 mEq/L (ref 3.5–5.1)
Sodium: 136 mEq/L (ref 135–145)

## 2013-05-25 LAB — CBC
HCT: 28.9 % — ABNORMAL LOW (ref 36.0–46.0)
Hemoglobin: 9 g/dL — ABNORMAL LOW (ref 12.0–15.0)
MCH: 18.8 pg — ABNORMAL LOW (ref 26.0–34.0)
MCHC: 31.1 g/dL (ref 30.0–36.0)
MCV: 60.2 fL — ABNORMAL LOW (ref 78.0–100.0)
Platelets: 284 10*3/uL (ref 150–400)
RBC: 4.8 MIL/uL (ref 3.87–5.11)
RDW: 17.6 % — ABNORMAL HIGH (ref 11.5–15.5)
WBC: 8.6 10*3/uL (ref 4.0–10.5)

## 2013-05-30 MED ORDER — GENTAMICIN SULFATE 40 MG/ML IJ SOLN
400.0000 mg | INTRAVENOUS | Status: AC
  Administered 2013-05-31: 400 mg via INTRAVENOUS
  Filled 2013-05-30: qty 10

## 2013-05-31 ENCOUNTER — Ambulatory Visit (HOSPITAL_COMMUNITY): Payer: Managed Care, Other (non HMO) | Admitting: Anesthesiology

## 2013-05-31 ENCOUNTER — Encounter (HOSPITAL_COMMUNITY): Payer: Self-pay | Admitting: *Deleted

## 2013-05-31 ENCOUNTER — Ambulatory Visit (HOSPITAL_COMMUNITY)
Admission: RE | Admit: 2013-05-31 | Discharge: 2013-05-31 | Disposition: A | Discharge status: Home / Self Care | Payer: Managed Care, Other (non HMO) | Source: Ambulatory Visit | Attending: Surgery | Admitting: Surgery

## 2013-05-31 ENCOUNTER — Encounter (HOSPITAL_COMMUNITY): Payer: Managed Care, Other (non HMO) | Admitting: Anesthesiology

## 2013-05-31 ENCOUNTER — Encounter (HOSPITAL_COMMUNITY)
Admission: RE | Disposition: A | Discharge status: Home / Self Care | Payer: Self-pay | Source: Ambulatory Visit | Attending: Surgery

## 2013-05-31 DIAGNOSIS — K644 Residual hemorrhoidal skin tags: Secondary | ICD-10-CM | POA: Insufficient documentation

## 2013-05-31 DIAGNOSIS — K5900 Constipation, unspecified: Secondary | ICD-10-CM | POA: Insufficient documentation

## 2013-05-31 DIAGNOSIS — K648 Other hemorrhoids: Secondary | ICD-10-CM

## 2013-05-31 DIAGNOSIS — Z90710 Acquired absence of both cervix and uterus: Secondary | ICD-10-CM | POA: Insufficient documentation

## 2013-05-31 HISTORY — PX: TRANSANAL HEMORRHOIDAL DEARTERIALIZATION: SHX6136

## 2013-05-31 HISTORY — PX: EVALUATION UNDER ANESTHESIA WITH HEMORRHOIDECTOMY: SHX5624

## 2013-05-31 LAB — GLUCOSE, CAPILLARY
Glucose-Capillary: 153 mg/dL — ABNORMAL HIGH (ref 70–99)
Glucose-Capillary: 145 mg/dL — ABNORMAL HIGH (ref 70–99)

## 2013-05-31 SURGERY — TRANSANAL HEMORRHOIDAL DEARTERIALIZATION
Anesthesia: General | Site: Rectum | Wound class: Contaminated

## 2013-05-31 MED ORDER — PRAMOXINE HCL 1 % RE FOAM: RECTAL | Status: DC | PRN

## 2013-05-31 MED ORDER — LACTATED RINGERS IV SOLN
INTRAVENOUS | Status: DC | PRN
  Administered 2013-05-31 (×2): via INTRAVENOUS

## 2013-05-31 MED ORDER — PROMETHAZINE HCL 25 MG/ML IJ SOLN: 6.2500 mg | INTRAMUSCULAR | Status: DC | PRN

## 2013-05-31 MED ORDER — LACTATED RINGERS IV SOLN: INTRAVENOUS | Status: DC

## 2013-05-31 MED ORDER — LIDOCAINE HCL (CARDIAC) 20 MG/ML IV SOLN
INTRAVENOUS | Status: DC | PRN
  Administered 2013-05-31: 30 mg via INTRAVENOUS

## 2013-05-31 MED ORDER — BUPIVACAINE LIPOSOME 1.3 % IJ SUSP
INTRAMUSCULAR | Status: DC | PRN
  Administered 2013-05-31: 20 mL

## 2013-05-31 MED ORDER — SODIUM CHLORIDE 0.9 % IJ SOLN: Freq: Once | INTRAMUSCULAR | Status: DC

## 2013-05-31 MED ORDER — HYDROMORPHONE HCL PF 1 MG/ML IJ SOLN
INTRAMUSCULAR | Status: AC
  Filled 2013-05-31: qty 1

## 2013-05-31 MED ORDER — OXYCODONE HCL 5 MG PO TABS
ORAL_TABLET | ORAL | Status: AC
  Administered 2013-05-31: 5 mg via ORAL
  Filled 2013-05-31: qty 1

## 2013-05-31 MED ORDER — HYDROMORPHONE HCL PF 1 MG/ML IJ SOLN
0.2500 mg | INTRAMUSCULAR | Status: DC | PRN
  Administered 2013-05-31 (×4): 0.5 mg via INTRAVENOUS

## 2013-05-31 MED ORDER — LIDOCAINE HCL 2 % EX GEL
CUTANEOUS | Status: DC | PRN
  Administered 2013-05-31: 1

## 2013-05-31 MED ORDER — CLINDAMYCIN PHOSPHATE 900 MG/50ML IV SOLN
INTRAVENOUS | Status: DC | PRN
  Administered 2013-05-31: 900 mg via INTRAVENOUS

## 2013-05-31 MED ORDER — OXYCODONE HCL 5 MG PO TABS: 5.0000 mg | ORAL_TABLET | ORAL | Status: DC | PRN

## 2013-05-31 MED ORDER — GLYCOPYRROLATE 0.2 MG/ML IJ SOLN
INTRAMUSCULAR | Status: DC | PRN
  Administered 2013-05-31: 0.6 mg via INTRAVENOUS

## 2013-05-31 MED ORDER — ONDANSETRON HCL 4 MG/2ML IJ SOLN
INTRAMUSCULAR | Status: DC | PRN
  Administered 2013-05-31: 2 mg via INTRAMUSCULAR

## 2013-05-31 MED ORDER — SODIUM CHLORIDE 0.9 % IJ SOLN
INTRAMUSCULAR | Status: DC | PRN
  Administered 2013-05-31: 10 mL via INTRAVENOUS

## 2013-05-31 MED ORDER — IBUPROFEN 800 MG PO TABS: 800.0000 mg | ORAL_TABLET | Freq: Four times a day (QID) | ORAL | Status: DC | PRN

## 2013-05-31 MED ORDER — SUCCINYLCHOLINE CHLORIDE 20 MG/ML IJ SOLN
INTRAMUSCULAR | Status: DC | PRN
  Administered 2013-05-31: 100 mg via INTRAVENOUS

## 2013-05-31 MED ORDER — OXYCODONE HCL 5 MG PO TABS
5.0000 mg | ORAL_TABLET | ORAL | Status: DC | PRN
  Administered 2013-05-31: 5 mg via ORAL

## 2013-05-31 MED ORDER — FENTANYL CITRATE 0.05 MG/ML IJ SOLN
INTRAMUSCULAR | Status: DC | PRN
  Administered 2013-05-31 (×4): 50 ug via INTRAVENOUS
  Administered 2013-05-31 (×2): 25 ug via INTRAVENOUS

## 2013-05-31 MED ORDER — LIDOCAINE HCL 2 % EX GEL
CUTANEOUS | Status: AC
  Filled 2013-05-31: qty 10

## 2013-05-31 MED ORDER — NEOSTIGMINE METHYLSULFATE 1 MG/ML IJ SOLN
INTRAMUSCULAR | Status: DC | PRN
  Administered 2013-05-31: 4 mg via INTRAVENOUS

## 2013-05-31 MED ORDER — MIDAZOLAM HCL 5 MG/5ML IJ SOLN
INTRAMUSCULAR | Status: DC | PRN
  Administered 2013-05-31: 2 mg via INTRAVENOUS

## 2013-05-31 MED ORDER — CLINDAMYCIN PHOSPHATE 900 MG/50ML IV SOLN
INTRAVENOUS | Status: AC
  Filled 2013-05-31: qty 50

## 2013-05-31 MED ORDER — CISATRACURIUM BESYLATE (PF) 10 MG/5ML IV SOLN
INTRAVENOUS | Status: DC | PRN
  Administered 2013-05-31: 3 mg via INTRAVENOUS
  Administered 2013-05-31: 6 mg via INTRAVENOUS

## 2013-05-31 MED ORDER — BUPIVACAINE LIPOSOME 1.3 % IJ SUSP
20.0000 mL | Freq: Once | INTRAMUSCULAR | Status: DC
  Filled 2013-05-31: qty 20

## 2013-05-31 MED ORDER — 0.9 % SODIUM CHLORIDE (POUR BTL) OPTIME
TOPICAL | Status: DC | PRN
  Administered 2013-05-31: 1000 mL

## 2013-05-31 MED ORDER — PROPOFOL INFUSION 10 MG/ML OPTIME
INTRAVENOUS | Status: DC | PRN
  Administered 2013-05-31: 180 mL via INTRAVENOUS

## 2013-05-31 SURGICAL SUPPLY — 31 items
BLADE HEX COATED 2.75 (ELECTRODE) ×2 IMPLANT
BLADE SURG 15 STRL LF DISP TIS (BLADE) ×1 IMPLANT
BLADE SURG 15 STRL SS (BLADE) ×2
BRIEF STRETCH FOR OB PAD LRG (UNDERPADS AND DIAPERS) ×2 IMPLANT
CANISTER SUCTION 2500CC (MISCELLANEOUS) ×2 IMPLANT
DECANTER SPIKE VIAL GLASS SM (MISCELLANEOUS) ×1 IMPLANT
DRAPE LAPAROTOMY T 102X78X121 (DRAPES) ×2 IMPLANT
DRSG PAD ABDOMINAL 8X10 ST (GAUZE/BANDAGES/DRESSINGS) ×1 IMPLANT
ELECT REM PT RETURN 9FT ADLT (ELECTROSURGICAL) ×2
ELECTRODE REM PT RTRN 9FT ADLT (ELECTROSURGICAL) ×1 IMPLANT
GAUZE SPONGE 4X4 16PLY XRAY LF (GAUZE/BANDAGES/DRESSINGS) ×2 IMPLANT
GLOVE BIOGEL PI IND STRL 8 (GLOVE) ×1 IMPLANT
GLOVE BIOGEL PI INDICATOR 8 (GLOVE) ×2
GLOVE ECLIPSE 8.0 STRL XLNG CF (GLOVE) ×1 IMPLANT
GLOVE INDICATOR 8.0 STRL GRN (GLOVE) ×1 IMPLANT
GOWN STRL REIN XL XLG (GOWN DISPOSABLE) ×5 IMPLANT
KIT BASIN OR (CUSTOM PROCEDURE TRAY) ×2 IMPLANT
KIT SLIDE ONE PROLAPS HEMORR (KITS) ×2 IMPLANT
LUBRICANT JELLY K Y 4OZ (MISCELLANEOUS) ×2 IMPLANT
NEEDLE HYPO 22GX1.5 SAFETY (NEEDLE) ×2 IMPLANT
NS IRRIG 1000ML POUR BTL (IV SOLUTION) ×2 IMPLANT
PACK BASIC VI WITH GOWN DISP (CUSTOM PROCEDURE TRAY) ×2 IMPLANT
PENCIL BUTTON HOLSTER BLD 10FT (ELECTRODE) ×1 IMPLANT
SPONGE GAUZE 4X4 12PLY (GAUZE/BANDAGES/DRESSINGS) ×1 IMPLANT
SUT CHROMIC 2 0 SH (SUTURE) IMPLANT
SUT CHROMIC 3 0 SH 27 (SUTURE) IMPLANT
SUT VIC AB 2-0 UR6 27 (SUTURE) IMPLANT
SYR 20CC LL (SYRINGE) ×2 IMPLANT
TOWEL OR 17X26 10 PK STRL BLUE (TOWEL DISPOSABLE) ×2 IMPLANT
TOWEL OR NON WOVEN STRL DISP B (DISPOSABLE) ×2 IMPLANT
YANKAUER SUCT BULB TIP 10FT TU (MISCELLANEOUS) ×2 IMPLANT

## 2013-05-31 NOTE — Anesthesia Postprocedure Evaluation (Signed)
Anesthesia Post Note  Patient: Abigail Hall  Procedure(s) Performed: Procedure(s) (LRB): THD HEMORRHOIDAL LIGATION/PEXY (N/A) EXAM UNDER ANESTHESIA WITH EXTERNAL HEMORRHOIDECTOMY (N/A)  Anesthesia type: General  Patient location: PACU  Post pain: Pain level controlled  Post assessment: Post-op Vital signs reviewed  Last Vitals: BP 144/81  Pulse 106  Temp(Src) 36.5 C (Oral)  Resp 14  SpO2 99%  LMP 04/25/2013  Breastfeeding? No  Post vital signs: Reviewed  Level of consciousness: sedated  Complications: No apparent anesthesia complications

## 2013-05-31 NOTE — H&P (View-Only) (Signed)
Subjective:     Patient ID: Abigail Hall, female   DOB: 1980-08-31, 32 y.o.   MRN: [id number]  HPI  Abigail Hall  Mar 06, 1981 [id number]  Patient Care Team: [name], MD as PCP - General (Obstetrics and Gynecology) [name], MD as Consulting Physician (Internal Medicine) [name], MD as Consulting Physician (Gastroenterology)  This patient is a 32 y.o.female who presents today for surgical evaluation at the request of Dr. Cherly Hensen.   Reason for visit: Painful hemorrhoids  Is a morbidly obese female struggled with hemorrhoids.  I saw her 18 months ago.  She was pregnant.  She had hemorrhoid flare.  I recommended warm soaks on a bowel regimen.  Then reevaluate after pregnancy to see if something more aggressive such as banding or surgery needed to be done.  Most of her symptoms seem to be related to her external hemorrhoids.  Pain and discomfort.  She has also struggled with some rectal bleeding.  She claims she has had a colonoscopy in the past that was normal.  Since I saw her in 2013, she has required numerous pelvic operations.  Now has a hysterectomy.  She is almost 2 weeks out from that.  She still struggles with hemorrhoids.  Pain and discomfort.  She still has severe constipation.  Takes MiraLAX often.  She recalls some medication that worked great for her in the spring.  She cannot recall the name.  She was hoping I could look that up.  I have not had any success.  Patient Active Problem List   Diagnosis Date Noted  . Microcytic anemia 03/02/2013  . UTI (urinary tract infection) 03/02/2013  . Anemia, iron deficiency 04/10/2012  . Hemorrhoids, internal, with bleeding 12/13/2011  . External hemorrhoids with pain 12/13/2011  . Anemia 12/13/2011  . Beta thalassemia minor 07/15/2011  . Medication exposure during first trimester of pregnancy 07/15/2011    Past Medical History  Diagnosis Date  . Hemorrhoids   . Rectal bleeding   . Constipation    . Nausea & vomiting     resolved after delivery - C/S  . Rectal pain   . Weakness     resolved after C/S delivery  . Generalized headaches   . Anemia   . Thalassemia   . Diabetes mellitus     gestational DM 2013 pregnancy-resolved  . Anemia, iron deficiency 04/10/2012  . Depression 04/26/13    history - no current problems  . Seasonal allergies   . Seizures     last seizure was  08/2012  . History of blood transfusion 02/2013    [facility]  2 units transfused     Past Surgical History  Procedure Laterality Date  . Tongue surgery    . Carpal tunnel release  01/2009    right  . Wisdom tooth extraction    . Cesarean section  12/31/2011    Procedure: CESAREAN SECTION;  Surgeon: Delbert Harness, MD;  Location: [facility];  Service: Gynecology;  Laterality: N/A;  . Laparoscopic tubal ligation  03/27/2012    Procedure: LAPAROSCOPIC TUBAL LIGATION;  Surgeon: Delbert Harness, MD;  Location: [facility];  Service: Gynecology;  Laterality: Bilateral;  fallopian tubes caurtery  . Labioplasty  03/27/2012    Procedure: LABIAPLASTY;  Surgeon: Delbert Harness, MD;  Location: [facility];  Service: Gynecology;  Laterality: N/A;  labia  . Tubal ligation    . Laparoscopy      removal of cyst  . Dilation  and curettage of uterus      endometrial ablation  . Abdominal hysterectomy    . Robotic assisted total hysterectomy N/A 05/02/2013    Procedure: ROBOTIC ASSISTED TOTAL HYSTERECTOMY;  Surgeon: [name], MD;  Location: [facility];  Service: Gynecology;  Laterality: N/A;  . Unilateral salpingectomy Right 05/02/2013    Procedure: UNILATERAL SALPINGECTOMY;  Surgeon: [name], MD;  Location: [facility];  Service: Gynecology;  Laterality: Right;    History   Social History  . Marital Status: Single    Spouse Name: N/A    Number of Children: N/A  . Years of Education: N/A   Occupational History  . Not on file.   Social History Main Topics  . Smoking status: Never Smoker   . Smokeless tobacco:  Never Used  . Alcohol Use: No  . Drug Use: No  . Sexual Activity: Yes    Birth Control/ Protection: None   Other Topics Concern  . Not on file   Social History Narrative  . No narrative on file    Family History  Problem Relation Age of Onset  . Anesthesia problems Neg Hx   . Hypotension Neg Hx   . Malignant hyperthermia Neg Hx   . Pseudochol deficiency Neg Hx   . Diabetes Mother   . Hyperlipidemia Father   . Diabetes Maternal Grandmother   . Cancer Maternal Grandfather   . Hyperlipidemia Paternal Grandfather     Current Outpatient Prescriptions  Medication Sig Dispense Refill  . divalproex (DEPAKOTE ER) 500 MG 24 hr tablet Take 500 mg by mouth 2 (two) times daily. Must be brand      . fluticasone (FLONASE) 50 MCG/ACT nasal spray Place 1 spray into the nose daily as needed for allergies.       . folic acid (FOLVITE) 1 MG tablet Take 2 tablets (2 mg total) by mouth daily.  60 tablet  12  . HYDROmorphone (DILAUDID) 4 MG tablet Take 1 tablet (4 mg total) by mouth every 4 (four) hours as needed.  30 tablet  0  . ibuprofen (ADVIL,MOTRIN) 800 MG tablet Take 1 tablet (800 mg total) by mouth every 8 (eight) hours as needed (mild pain).  30 tablet  1  . lacosamide (VIMPAT) 50 MG TABS Take 100 mg by mouth 2 (two) times daily.      . metFORMIN (GLUCOPHAGE) 500 MG tablet Take 1 tablet (500 mg total) by mouth every evening.  30 tablet  5  . oxyCODONE-acetaminophen (PERCOCET/ROXICET) 5-325 MG per tablet Take 1-2 tablets by mouth every 4 (four) hours as needed.  30 tablet  0  . Prenatal Vit-Fe Fumarate-FA (PRENATAL MULTIVITAMIN) TABS tablet Take 1 tablet by mouth daily at 12 noon.      . simethicone (MYLICON) 80 MG chewable tablet Chew 1 tablet (80 mg total) by mouth 4 (four) times daily.  30 tablet  0  . vitamin C (ASCORBIC ACID) 500 MG tablet Take 500 mg by mouth daily.      Marland Kitchen zolpidem (AMBIEN) 5 MG tablet Take 2 tablets (10 mg total) by mouth at bedtime as needed. As needed for insomnia   30 tablet  0   No current facility-administered medications for this visit.     Allergies  Allergen Reactions  . Latex Itching, Swelling and Rash    Where touched.  . Keflex [Cephalexin] Hives    BP 106/58  Pulse 105  Temp(Src) 97 F (36.1 C) (Temporal)  Ht 5\' 4"  (1.626 m)  Wt 278 lb (126.1 kg)  BMI 47.7 kg/m2  SpO2 97%  LMP 04/25/2013  No results found.   Review of Systems  Constitutional: Negative for fever, chills, diaphoresis, appetite change and fatigue.  HENT: Negative for ear pain, sore throat, trouble swallowing, neck pain and ear discharge.   Eyes: Negative for photophobia, discharge and visual disturbance.  Respiratory: Negative for cough, choking, chest tightness and shortness of breath.   Cardiovascular: Negative for chest pain and palpitations.  Gastrointestinal: Positive for constipation, anal bleeding and rectal pain. Negative for nausea, vomiting, abdominal pain and diarrhea.  Endocrine: Negative for cold intolerance and heat intolerance.  Genitourinary: Negative for dysuria, frequency and difficulty urinating.  Musculoskeletal: Negative for myalgias and gait problem.  Skin: Negative for color change, pallor and rash.  Allergic/Immunologic: Negative for environmental allergies, food allergies and immunocompromised state.  Neurological: Negative for dizziness, speech difficulty, weakness and numbness.  Hematological: Negative for adenopathy.  Psychiatric/Behavioral: Negative for confusion and agitation. The patient is not nervous/anxious.        Objective:   Physical Exam  Constitutional: She is oriented to person, place, and time. She appears well-developed and well-nourished. No distress.  HENT:  Head: Normocephalic.  Mouth/Throat: Oropharynx is clear and moist. No oropharyngeal exudate.  Eyes: Conjunctivae and EOM are normal. Pupils are equal, round, and reactive to light. No scleral icterus.  Neck: Normal range of motion. Neck supple. No tracheal  deviation present.  Cardiovascular: Normal rate, regular rhythm and intact distal pulses.   Pulmonary/Chest: Effort normal and breath sounds normal. No stridor. No respiratory distress. She exhibits no tenderness.  Abdominal: Soft. She exhibits no distension and no mass. There is no tenderness. Hernia confirmed negative in the right inguinal area and confirmed negative in the left inguinal area.  Genitourinary: No vaginal discharge found.  Exam done with assistance of female Medical Assistant in the room.  Perianal skin clean with good hygiene.  No pruritis.  No pilonidal disease.  Small anterior midline fissure.  No abscess/fistula.    R ant/posterior external hemorrhoids enlarged & sensitive.  Tolerates digital and anoscopic rectal exam.  Normal sphincter tone.  No rectal masses.  Hemorrhoidal piles enlarged R anterior > Posterior.  L int hem WNL   Musculoskeletal: Normal range of motion. She exhibits no tenderness.       Right elbow: She exhibits normal range of motion.       Left elbow: She exhibits normal range of motion.       Right wrist: She exhibits normal range of motion.       Left wrist: She exhibits normal range of motion.       Right hand: Normal strength noted.       Left hand: Normal strength noted.  Lymphadenopathy:       Head (right side): No posterior auricular adenopathy present.       Head (left side): No posterior auricular adenopathy present.    She has no cervical adenopathy.    She has no axillary adenopathy.       Right: No inguinal adenopathy present.       Left: No inguinal adenopathy present.  Neurological: She is alert and oriented to person, place, and time. No cranial nerve deficit. She exhibits normal muscle tone. Coordination normal.  Skin: Skin is warm and dry. No rash noted. She is not diaphoretic. No erythema.  Psychiatric: She has a normal mood and affect. Her behavior is normal. Judgment and thought content normal.  Assessment:     Worsening  hemorrhoids in the setting of severe chronic constipation.     Plan:     This point, I think the external hemorrhoids are too sensitive to manage in the office.  Moderately enlarged hemorrhoids internally as well.  I think she would benefit from surgery.  I recommended THD ligation For the internal hemorrhoids and external hemorrhoidectomy and the remaining tissue:  The anatomy & physiology of the anorectal region was discussed.  The pathophysiology of hemorrhoids and differential diagnosis was discussed.  Natural history risks without surgery was discussed.   I stressed the importance of a bowel regimen to have daily soft bowel movements to minimize progression of disease.  Interventions such as sclerotherapy & banding were discussed.  The patient's symptoms are not adequately controlled by medicines and other non-operative treatments.  I feel the risks & problems of no surgery outweigh the operative risks; therefore, I recommended surgery to treat the hemorrhoids by ligation, pexy, and possible resection.  Risks such as bleeding, infection, urinary difficulties, need for further treatment, heart attack, death, and other risks were discussed.   I noted a good likelihood this will help address the problem.  Goals of post-operative recovery were discussed as well.  Possibility that this will not correct all symptoms was explained.  Post-operative pain, bleeding, constipation, and other problems after surgery were discussed.  We will work to minimize complications.   Educational handouts further explaining the pathology, treatment options, and bowel regimen were given as well.  Questions were answered.  The patient expresses understanding & wishes to proceed with surgery.  I strongly recommend she get back on a bowel regimen.  She recalls some medication given to her last year that worked well as a prescription.  She cannot remember what it is. She is hoping I knew.   Eventually after coming around it  seems like it was just MiraLAX.  She wished packets.  I tried to light.  Perhaps she needs to see Dr. Elnoria Howard to help sort out constipation.  I would increase her MiraLAX to four times a day as needed (double dose twice a day) until she is having one soft bowel movement a day.  I stressed to her that until her severe constipation is under control, the hemorrhoidectomy will be just a temporary help.

## 2013-05-31 NOTE — Op Note (Signed)
05/31/2013  2:31 PM  PATIENT:  Abigail Hall  32 y.o. female  Patient Care Team: [name], MD as PCP - General (Obstetrics and Gynecology) [name], MD as Consulting Physician (Internal Medicine) [name], MD as Consulting Physician (Gastroenterology)  PRE-OPERATIVE DIAGNOSIS:  painful internal/external hemorrhoids  POST-OPERATIVE DIAGNOSIS:  painful internal/external hemorrhoids  PROCEDURE:  Procedure(s): THD HEMORRHOIDAL LIGATION/PEXY EXAM UNDER ANESTHESIA EXTERNAL HEMORRHOIDECTOMY x3  *SURGEON:  Surgeon(s): Ardeth Sportsman, MD  ASSISTANT: Chelsea Aus, PA student   ANESTHESIA:   local and general  EBL:  Total I/O In: 1000 [I.V.:1000] Out: -   Delay start of Pharmacological VTE agent (>24hrs) due to surgical blood loss or risk of bleeding:  no  DRAINS: none   SPECIMEN:  Source of Specimen:  external hemorrhoids  DISPOSITION OF SPECIMEN:  PATHOLOGY  COUNTS:  YES  PLAN OF CARE: Discharge to home after PACU  PATIENT DISPOSITION:  PACU - hemodynamically stable.  INDICATION: Woman struggling with external hemorrhoids.  Moderate size internal hemorrhoids as well.  I recommended surgical treatment.  THD ligation/pexy.  Removal of any external hemorrhoids:  The anatomy & physiology of the anorectal region was discussed.  The pathophysiology of hemorrhoids and differential diagnosis was discussed.  Natural history risks without surgery was discussed.   I stressed the importance of a bowel regimen to have daily soft bowel movements to minimize progression of disease.  Interventions such as sclerotherapy & banding were discussed.  The patient's symptoms are not adequately controlled by medicines and other non-operative treatments.  I feel the risks & problems of no surgery outweigh the operative risks; therefore, I recommended surgery to treat the hemorrhoids by ligation, pexy, and possible resection.  Risks such as bleeding, infection, urinary  difficulties, need for further treatment, heart attack, death, and other risks were discussed.   I noted a good likelihood this will help address the problem.  Goals of post-operative recovery were discussed as well.  Possibility that this will not correct all symptoms was explained.  Post-operative pain, bleeding, constipation, and other problems after surgery were discussed.  We will work to minimize complications.   Educational handouts further explaining the pathology, treatment options, and bowel regimen were given as well.  Questions were answered.  The patient expresses understanding & wishes to proceed with surgery.   OR FINDINGS: Moderate internal hemorrhoidal enlargement.  Left lateral greater than right anterior greater than right posterior.  Small left lateral and right posterior external hemorrhoid skin tags.  Medium size right anterior external hemorrhoid tending to go anterior midline.  DESCRIPTION:   Informed consent was confirmed. Patient underwent general anesthesia without difficulty. Patient was placed into prone positioning.  The perianal region was prepped and draped in sterile fashion. Surgical time-out confirmed our plan.  I did digital rectal examination and then transitioned over to anoscopy to get a sense of the anatomy.  I switched over to the [facility] fiberoptically lit Doppler anocope.   Using the Doppler on the tip of the THD anoscope, I identified the arterial hemorrhoidal vessels coming in in the classic hexagonal anatomical pattern (right posterior/lateral/anterior, left posterior /lateral/anterior).    I proceeded to ligate the hemorrhoidal arteries. I used a 2-0 Vicryl suture on a UR-6 needle in a figure-of-eight fashion over the signal around 6 cm proximal to the anal verge. I then ran that stitch longitudinally more distally to the white line of [place]. I then tied that stitch down to cause a hemorrhoidopexy. I did that  for all 6 locations.    I redid Doppler anoscopy.  I Identified a signal at the right lateral location.  I isolated and ligated this with a figure-of-eight stitch. Signals went away.  At completion of this, internal hemorrhoids were reduced into the rectum. There is no more prolapse. I went in and removed the cystic small external hemorrhoidal tags on the left lateral and right posterior piles.  I left the wound open.  They were 7 x 5 mm.  On the right anterior aspect I removed the larger external hemorrhoid.  Again I left the wound open.  That was more by 2 x 0.7 cm.  Long ellipsoid sliver.  Hemostasis is excellent at the end of the case  I repeated anoscopy and examination.  Hemostasis was good.  Patient is being extubated go to recovery room.  I had discussed postop care in detail with the patient in the preop holding area.  Instructions are written.  I am about to discuss the patient's status to the family.

## 2013-05-31 NOTE — Anesthesia Preprocedure Evaluation (Signed)
Anesthesia Evaluation  Patient identified by MRN, date of birth, ID band Patient awake    Reviewed: Allergy & Precautions, H&P , NPO status , Patient's Chart, lab work & pertinent test results  Airway Mallampati: II TM Distance: >3 FB Neck ROM: Full    Dental no notable dental hx. (+) Teeth Intact   Pulmonary  breath sounds clear to auscultation  Pulmonary exam normal       Cardiovascular + Peripheral Vascular Disease negative cardio ROS  Rhythm:Regular Rate:Normal     Neuro/Psych  Headaches, Seizures -, Well Controlled,  PSYCHIATRIC DISORDERS Depression    GI/Hepatic negative GI ROS, Neg liver ROS,   Endo/Other  diabetes (DM with gestation), Well Controlled, GestationalMorbid obesity  Renal/GU negative Renal ROS Bladder dysfunction      Musculoskeletal negative musculoskeletal ROS (+)   Abdominal (+) + obese,   Peds  Hematology negative hematology ROS (+) Blood dyscrasia, anemia ,   Anesthesia Other Findings   Reproductive/Obstetrics negative OB ROS                           Anesthesia Physical Anesthesia Plan  ASA: III  Anesthesia Plan: General   Post-op Pain Management:    Induction: Intravenous  Airway Management Planned: Oral ETT  Additional Equipment:   Intra-op Plan:   Post-operative Plan: Extubation in OR  Informed Consent: I have reviewed the patients History and Physical, chart, labs and discussed the procedure including the risks, benefits and alternatives for the proposed anesthesia with the patient or authorized representative who has indicated his/her understanding and acceptance.   Dental advisory given  Plan Discussed with: CRNA  Anesthesia Plan Comments:         Anesthesia Quick Evaluation

## 2013-05-31 NOTE — Interval H&P Note (Signed)
History and Physical Interval Note:  05/31/2013 12:12 PM  Abigail Hall  has presented today for surgery, with the diagnosis of painful internal/external hemorrhoids  The various methods of treatment have been discussed with the patient and family. After consideration of risks, benefits and other options for treatment, the patient has consented to  Procedure(s): THD HEMORRHOIDAL LIGATION/PEXY (N/A) EXAM UNDER ANESTHESIA WITH EXTERNAL HEMORRHOIDECTOMY (N/A) as a surgical intervention .  The patient's history has been reviewed, patient examined, no change in status, stable for surgery.  I have reviewed the patient's chart and labs.  I updated the patient's status to the patient.  Post-op recommendations were made.  The patient expressed understanding & appreciation.  Questions were answered to the patient's satisfaction.     Aundra Espin C.

## 2013-05-31 NOTE — Transfer of Care (Signed)
Immediate Anesthesia Transfer of Care Note  Patient: Abigail Hall  Procedure(s) Performed: Procedure(s) (LRB): THD HEMORRHOIDAL LIGATION/PEXY (N/A) EXAM UNDER ANESTHESIA WITH EXTERNAL HEMORRHOIDECTOMY (N/A)  Patient Location: PACU  Anesthesia Type: General  Level of Consciousness: sedated, patient cooperative and responds to stimulation  Airway & Oxygen Therapy: Patient Spontanous Breathing and Patient connected to face mask oxgen  Post-op Assessment: Report given to PACU RN and Post -op Vital signs reviewed and stable  Post vital signs: Reviewed and stable  Complications: No apparent anesthesia complications

## 2013-06-01 ENCOUNTER — Encounter (HOSPITAL_COMMUNITY): Payer: Self-pay | Admitting: Surgery

## 2013-06-05 ENCOUNTER — Telehealth (INDEPENDENT_AMBULATORY_CARE_PROVIDER_SITE_OTHER): Payer: Self-pay

## 2013-06-05 ENCOUNTER — Telehealth (INDEPENDENT_AMBULATORY_CARE_PROVIDER_SITE_OTHER): Payer: Self-pay | Admitting: *Deleted

## 2013-06-05 ENCOUNTER — Other Ambulatory Visit (INDEPENDENT_AMBULATORY_CARE_PROVIDER_SITE_OTHER): Payer: Self-pay | Admitting: *Deleted

## 2013-06-05 MED ORDER — OXYCODONE HCL 5 MG PO TABS: 10.0000 mg | ORAL_TABLET | ORAL | Status: DC | PRN

## 2013-06-05 NOTE — Telephone Encounter (Signed)
Patient walked into the office this afternoon stating severe pain and asking for a refill of pain medication.  Patient denies fever, signs of infection, difficulty with BMs or trouble with urination.  Spoke to Dr. Luisa Hart who spoke to Dr. Michaell Cowing.  Dr. Luisa Hart wrote prescription for Oxycodone 10mg  Take 1 tablet every 4 hours as needed for pain #25 no refills.  Prescription given to front desk for patient to pickup.

## 2013-06-05 NOTE — Telephone Encounter (Signed)
LMOM for pt to call our office to give her the good news on her pathology report just showing anal tags c/w external hemorrhoids per Dr Michaell Cowing.

## 2013-06-05 NOTE — Telephone Encounter (Signed)
Pt returned call and was given the pathology report.

## 2013-06-13 ENCOUNTER — Ambulatory Visit (INDEPENDENT_AMBULATORY_CARE_PROVIDER_SITE_OTHER): Payer: Managed Care, Other (non HMO) | Admitting: Surgery

## 2013-06-13 ENCOUNTER — Encounter (INDEPENDENT_AMBULATORY_CARE_PROVIDER_SITE_OTHER): Payer: Self-pay | Admitting: Surgery

## 2013-06-13 ENCOUNTER — Encounter (INDEPENDENT_AMBULATORY_CARE_PROVIDER_SITE_OTHER): Payer: Self-pay

## 2013-06-13 VITALS — BP 128/80 | HR 76 | Temp 97.2°F | Resp 16 | Ht 64.0 in | Wt 265.0 lb

## 2013-06-13 DIAGNOSIS — K648 Other hemorrhoids: Secondary | ICD-10-CM

## 2013-06-13 DIAGNOSIS — K5900 Constipation, unspecified: Secondary | ICD-10-CM

## 2013-06-13 DIAGNOSIS — K644 Residual hemorrhoidal skin tags: Secondary | ICD-10-CM

## 2013-06-13 DIAGNOSIS — K5909 Other constipation: Secondary | ICD-10-CM

## 2013-06-13 MED ORDER — HYDROCORTISONE 2.5 % RE CREA: TOPICAL_CREAM | Freq: Two times a day (BID) | RECTAL | Status: DC

## 2013-06-13 MED ORDER — OXYCODONE HCL 5 MG PO TABS: 5.0000 mg | ORAL_TABLET | Freq: Four times a day (QID) | ORAL | Status: DC | PRN

## 2013-06-13 NOTE — Patient Instructions (Signed)
ANORECTAL SURGERY: POST OP INSTRUCTIONS  1. Take your usually prescribed home medications unless otherwise directed. 2. DIET: Follow a light bland diet the first 24 hours after arrival home, such as soup, liquids, crackers, etc.  Be sure to include lots of fluids daily.  Avoid fast food or heavy meals as your are more likely to get nauseated.  Eat a low fat the next few days after surgery.   3. PAIN CONTROL: a. Pain is best controlled by a usual combination of three different methods TOGETHER: i. Ice/Heat ii. Over the counter pain medication iii. Prescription pain medication b. Most patients will experience some swelling and discomfort in the anus/rectal area. and incisions.  Ice packs or heat (30-60 minutes up to 6 times a day) will help. Use ice for the first few days to help decrease swelling and bruising, then switch to heat such as warm towels, sitz baths, warm baths, etc to help relax tight/sore spots and speed recovery.  Some people prefer to use ice alone, heat alone, alternating between ice & heat.  Experiment to what works for you.  Swelling and bruising can take several weeks to resolve.   c. It is helpful to take an over-the-counter pain medication regularly for the first few weeks.  Choose one of the following that works best for you: i. Naproxen (Aleve, etc)  Two 220mg tabs twice a day ii. Ibuprofen (Advil, etc) Three 200mg tabs four times a day (every meal & bedtime) iii. Acetaminophen (Tylenol, etc) 500-650mg four times a day (every meal & bedtime) d. A  prescription for pain medication (such as oxycodone, hydrocodone, etc) should be given to you upon discharge.  Take your pain medication as prescribed.  i. If you are having problems/concerns with the prescription medicine (does not control pain, nausea, vomiting, rash, itching, etc), please call us [phone] to see if we need to switch you to a different pain medicine that will work better for you and/or control your side effect  better. ii. If you need a refill on your pain medication, please contact your pharmacy.  They will contact our office to request authorization. Prescriptions will not be filled after 5 pm or on week-ends.  Use a Sitz Bath 4-8 times a day for relief A sitz bath is a warm water bath taken in the sitting position that covers only the hips and buttocks. It may be used for either healing or hygiene purposes. Sitz baths are also used to relieve pain, itching, or muscle spasms. The water may contain medicine. Moist heat will help you heal and relax.  HOME CARE INSTRUCTIONS  Take 3 to 4 sitz baths a day. 1. Fill the bathtub half full with warm water. 2. Sit in the water and open the drain a little. 3. Turn on the warm water to keep the tub half full. Keep the water running constantly. 4. Soak in the water for 15 to 20 minutes. 5. After the sitz bath, pat the affected area dry first. SEEK MEDICAL CARE IF:  You get worse instead of better. Stop the sitz baths if you get worse.   4. KEEP YOUR BOWELS REGULAR a. The goal is one bowel movement a day b. Avoid getting constipated.  Between the surgery and the pain medications, it is common to experience some constipation.  Increasing fluid intake and taking a fiber supplement (such as Metamucil, Citrucel, FiberCon, MiraLax, etc) 1-2 times a day regularly will usually help prevent this problem from occurring.  A mild   laxative (prune juice, Milk of Magnesia, MiraLax, etc) should be taken according to package directions if there are no bowel movements after 48 hours. c. Watch out for diarrhea.  If you have many loose bowel movements, simplify your diet to bland foods & liquids for a few days.  Stop any stool softeners and decrease your fiber supplement.  Switching to mild anti-diarrheal medications (Kayopectate, Pepto Bismol) can help.  If this worsens or does not improve, please call us.  5. Wound Care a. Remove your bandages the day after surgery.  Unless  discharge instructions indicate otherwise, leave your bandage dry and in place overnight.  Remove the bandage during your first bowel movement.   b. Allow the wound packing to fall out over the next few days.  You can trim exposed gauze / ribbon as it falls out.  You do not need to repack the wound unless instructed otherwise.  Wear an absorbent pad or soft cotton gauze in your underwear as needed to catch any drainage and help keep the area  c. Keep the area clean and dry.  Bathe / shower every day.  Keep the area clean by showering / bathing over the incision / wound.   It is okay to soak an open wound to help wash it.  Wet wipes or showers / gentle washing after bowel movements is often less traumatic than regular toilet paper. d. You may have some styrofoam-like soft packing in the rectum which will come out with the first bowel movement.  e. You will often notice bleeding with bowel movements.  This should slow down by the end of the first week of surgery f. Expect some drainage.  This should slow down, too, by the end of the first week of surgery.  Wear an absorbent pad or soft cotton gauze in your underwear until the drainage stops. 6. ACTIVITIES as tolerated:   a. You may resume regular (light) daily activities beginning the next day-such as daily self-care, walking, climbing stairs-gradually increasing activities as tolerated.  If you can walk 30 minutes without difficulty, it is safe to try more intense activity such as jogging, treadmill, bicycling, low-impact aerobics, swimming, etc. b. Save the most intensive and strenuous activity for last such as sit-ups, heavy lifting, contact sports, etc  Refrain from any heavy lifting or straining until you are off narcotics for pain control.   c. DO NOT PUSH THROUGH PAIN.  Let pain be your guide: If it hurts to do something, don't do it.  Pain is your body warning you to avoid that activity for another week until the pain goes down. d. You may drive when  you are no longer taking prescription pain medication, you can comfortably sit for long periods of time, and you can safely maneuver your car and apply brakes. e. You may have sexual intercourse when it is comfortable.  7. FOLLOW UP in our office a. Please call [facility] at [phone] to set up an appointment to see your surgeon in the office for a follow-up appointment approximately 2 weeks after your surgery. b. Make sure that you call for this appointment the day you arrive home to insure a convenient appointment time. 10. IF YOU HAVE DISABILITY OR FAMILY LEAVE FORMS, BRING THEM TO THE OFFICE FOR PROCESSING.  DO NOT GIVE THEM TO YOUR DOCTOR.        WHEN TO CALL US [phone]: 1. Poor pain control 2. Reactions / problems with new medications (rash/itching, nausea, etc)    3. Fever over 101.5 F (38.5 C) 4. Inability to urinate 5. Nausea and/or vomiting 6. Worsening swelling or bruising 7. Continued bleeding from incision. 8. Increased pain, redness, or drainage from the incision  The clinic staff is available to answer your questions during regular business hours (8:30am-5pm).  Please don't hesitate to call and ask to speak to one of our nurses for clinical concerns.   A surgeon from [facility] is always on call at the hospitals   If you have a medical emergency, go to the nearest emergency room or call 911.    [facility] [address], [address], [place],   [address] ? MAIN: [phone] ? TOLL FREE: [phone] ? FAX [phone] [email or web address]  HEMORRHOIDS  The rectum is the last foot of your colon, and it naturally stretches to hold stool.  Hemorrhoidal piles are natural clusters of blood vessels that help the rectum and anal canal stretch to hold stool and allow bowel movements to eliminate feces.   Hemorrhoids are abnormally swollen blood vessels in the rectum.  Too much pressure in the rectum causes  hemorrhoids by forcing blood to stretch and bulge the walls of the veins, sometimes even rupturing them.  Hemorrhoids can become like varicose veins you might see on a person's legs.  Most people will develop a flare of hemorrhoids in their lifetime.  When bulging hemorrhoidal veins are irritated, they can swell, burn, itch, cause pain, and bleed.  Most flares will calm down gradually own within a few weeks.  However, once hemorrhoids are created, they are difficult to get rid of completely and tend to flare more easily than the first flare.   Fortunately, good habits and simple medical treatment usually control hemorrhoids well, and surgery is needed only in severe cases. Types of Hemorrhoids:  Internal hemorrhoids usually don't initially hurt or itch; they are deep inside the rectum and usually have no sensation. If they begin to push out (prolapse), pain and burning can occur.  However, internal hemorrhoids can bleed.  Anal bleeding should not be ignored since bleeding could come from a dangerous source like colorectal cancer, so persistent rectal bleeding should be investigated by a doctor, sometimes with a colonoscopy.  External hemorrhoids cause most of the symptoms - pain, burning, and itching. Nonirritated hemorrhoids can look like small skin tags coming out of the anus.   Thrombosed hemorrhoids can form when a hemorrhoid blood vessel bursts and causes the hemorrhoid to suddenly swell.  A purple blood clot can form in it and become an excruciatingly painful lump at the anus. Because of these unpleasant symptoms, immediate incision and drainage by a surgeon at an office visit can provide much relief of the pain.    PREVENTION Avoiding the most frequent causes listed below will prevent most cases of hemorrhoids: Constipation Hard stools Diarrhea  Constant sitting  Straining with bowel movements Sitting on the toilet for a long time  Severe coughing  episodes Pregnancy / Childbirth  Heavy  Lifting  Sometimes avoiding the above triggers is difficult:  How can you avoid sitting all day if you have a seated job? Also, we try to avoid coughing and diarrhea, but sometimes it's beyond your control.  Still, there are some practical hints to help: Keep the anal and genital area clean.  Moistened tissues such as flushable wet wipes are less irritating than toilet paper.  Using irrigating showers or bottle irrigation washing gently cleans this sensitive area.     Avoid dry toilet paper when cleaning after bowel movements.  . Keep the anal and genital area dry.  Lightly pat the rectal area dry.  Avoid rubbing.  Talcum or baby powders can help GET YOUR STOOLS SOFT.   This is the most important way to prevent irritated hemorrhoids.  Hard stools are like sandpaper to the anorectal canal and will cause more problems.  The goal: ONE SOFT BOWEL MOVEMENT A DAY!  BMs from every other day to 3 times a day is a tolerable range Treat coughing, diarrhea and constipation early since irritated hemorrhoids may soon follow.  If your main job activity is seated, always stand or walk during your breaks. Make it a point to stand and walk at least 5 minutes every hour and try to shift frequently in your chair to avoid direct rectal pressure.  Always exhale as you strain or lift. Don't hold your breath.  Do not delay or try to prevent a bowel movement when the urge is present. Exercise regularly (walking or jogging 60 minutes a day) to stimulate the bowels to move. No reading or other activity while on the toilet. If bowel movements take longer than 5 minutes, you are too constipated. AVOID CONSTIPATION Drink plenty of liquids (1 1/2 to 2 quarts of water and other fluids a day unless fluid restricted for another medical condition). Liquids that contain caffeine (coffee a, tea, soft drinks) can be dehydrating and should be avoided until constipation is controlled. Consider minimizing milk, as dairy products may be  constipating. Eat plenty of fiber (30g a day ideal, more if needed).  Fiber is the undigested part of plant food that passes into the colon, acting as "natures broom" to encourage bowel motility and movement.  Fiber can absorb and hold large amounts of water. This results in a larger, bulkier stool, which is soft and easier to pass.  Eating foods high in fiber - 12 servings - such as  Vegetables: Root (potatoes, carrots, turnips), Leafy green (lettuce, salad greens, celery, spinach), High residue (cabbage, broccoli, etc.) Fruit: Fresh, Dried (prunes, apricots, cherries), Stewed (applesauce)  Whole grain breads, pasta, whole wheat Bran cereals, muffins, etc. Consider adding supplemental bulking fiber which retains large volumes of water: Psyllium ground seeds (native plant from [place])--available as Metamucil, Konsyl, Effersyllium, Per Diem Fiber, or the less expensive generic forms.  Citrucel  (methylcellulose wood fiber) . FiberCon (Polycarbophil) Polyethylene Glycol - and "artificial" fiber commonly called Miralax or Glycolax.  It is helpful for people with gassy or bloated feelings with regular fiber Flax Seed - a less gassy natural fiber  Laxatives can be useful for a short period if constipation is severe Osmotics (Milk of Magnesia, Fleets Phospho-Soda, Magnesium Citrate)  Stimulants (Senokot,   Castor Oil,  Dulcolax, Ex-Lax)    Laxatives are not a good long-term solution as it can stress the bowels and cause too much mineral loss and dehydration.   Avoid taking laxatives for more than 7 days in a row.  AVOID DIARRHEA Switch to liquids and simpler foods for a few days to avoid stressing your intestines further. Avoid dairy products (especially milk & ice cream) for a short time.  The intestines often can lose the ability to digest lactose when stressed. Avoid foods that cause gassiness or bloating.  Typical foods include beans and other legumes, cabbage, broccoli, and dairy foods.   Every person has some sensitivity to other foods, so listen to your body and avoid those foods that trigger problems for   you. Adding fiber (Citrucel, Metamucil, FiberCon, Flax seed, Miralax) gradually can help thicken stools by absorbing excess fluid and retrain the intestines to act more normally.  Slowly increase the dose over a few weeks.  Too much fiber too soon can backfire and cause cramping & bloating. Probiotics (such as active yogurt, Align, etc) may help repopulate the intestines and colon with normal bacteria and calm down a sensitive digestive tract.  Most studies show it to be of mild help, though, and such products can be costly. Medicines: Bismuth subsalicylate (ex. Kayopectate, Pepto Bismol) every 30 minutes for up to 6 doses can help control diarrhea.  Avoid if pregnant. Loperamide (Immodium) can slow down diarrhea.  Start with two tablets (4mg total) first and then try one tablet every 6 hours.  Avoid if you are having fevers or severe pain.  If you are not better or start feeling worse, stop all medicines and call your doctor for advice Call your doctor if you are getting worse or not better.  Sometimes further testing (cultures, endoscopy, X-ray studies, bloodwork, etc) may be needed to help diagnose and treat the cause of the diarrhea. TREATMENT OF HEMORRHOID FLARE If these preventive measures fail, you must take action right away! Hemorrhoids are one condition that can be mild in the morning and become intolerable by nightfall. Most hemorrhoidal flares take several weeks to calm down.  These suggestions can help: Warm soaks.  This helps more than any topical medication.  Use up to 8 times a day.  Usually sitz baths or sitting in a warm bathtub helps.  Sitting on moist warm towels are helpful.  Switching to ice packs/cool compresses can be helpful  Use a Sitz Bath 4-8 times a day for relief A sitz bath is a warm water bath taken in the sitting position that covers only the hips and  buttocks. It may be used for either healing or hygiene purposes. Sitz baths are also used to relieve pain, itching, or muscle spasms. The water may contain medicine. Moist heat will help you heal and relax.  HOME CARE INSTRUCTIONS  Take 3 to 4 sitz baths a day. 6. Fill the bathtub half full with warm water. 7. Sit in the water and open the drain a little. 8. Turn on the warm water to keep the tub half full. Keep the water running constantly. 9. Soak in the water for 15 to 20 minutes. 10. After the sitz bath, pat the affected area dry first. SEEK MEDICAL CARE IF:  You get worse instead of better. Stop the sitz baths if you get worse.  Normalize your bowels.  Extremes of diarrhea or constipation will make hemorrhoids worse.  One soft bowel movement a day is the goal.  Fiber can help get your bowels regular Wet wipes instead of toilet paper Pain control with a NSAID such as ibuprofen (Advil) or naproxen (Aleve) or acetaminophen (Tylenol) around the clock.  Narcotics are constipating and should be minimized if possible Topical creams contain steroids (bydrocortisone) or local anesthetic (xylocaine) can help make pain and itching more tolerable.   EVALUATION If hemorrhoids are still causing problems, you could benefit by an evaluation by a surgeon.  The surgeon will obtain a history and examine you.  If hemorrhoids are diagnosed, some therapies can be offered in the office, usually with an anoscope into the less sensitive area of the rectum: -injection of hemorrhoids (sclerotherapy) can scar the blood vessels of the swollen/enlarged hemorrhoids to help shrink them down to   a more normal size -rubber banding of the enlarged hemorrhoids to help shrink them down to a more normal size -drainage of the blood clot causing a thrombosed hemorrhoid,  to relieve the severe pain   While 90% of the time such problems from hemorrhoids can be managed without preceding to surgery, sometimes the hemorrhoids require a  operation to control the problem (uncontrolled bleeding, prolapse, pain, etc.).   This involves being placed under general anesthesia where the surgeon can confirm the diagnosis and remove, suture, or staple the hemorrhoid(s).  Your surgeon can help you treat the problem appropriately.    Managing Pain  Pain after surgery or related to activity is often due to strain/injury to muscle, tendon, nerves and/or incisions.  This pain is usually short-term and will improve in a few months.   Many people find it helpful to do the following things TOGETHER to help speed the process of healing and to get back to regular activity more quickly:  1. Avoid heavy physical activity a.  no lifting greater than 20 pounds b. Do not "push through" the pain.  Listen to your body and avoid positions and maneuvers than reproduce the pain c. Walking is okay as tolerated, but go slowly and stop when getting sore.  d. Remember: If it hurts to do it, then don't do it! 2. Take Anti-inflammatory medication  a. Take with food/snack around the clock for 1-2 weeks i. This helps the muscle and nerve tissues become less irritable and calm down faster b. Choose ONE of the following over-the-counter medications: i. Naproxen 220mg tabs (ex. Aleve) 1-2 pills twice a day  ii. Ibuprofen 200mg tabs (ex. Advil, Motrin) 3-4 pills with every meal and just before bedtime iii. Acetaminophen 500mg tabs (Tylenol) 1-2 pills with every meal and just before bedtime 3. Use a Heating pad or Ice/Cold Pack a. 4-6 times a day b. May use warm bath/hottub  or showers 4. Try Gentle Massage and/or Stretching  a. at the area of pain many times a day b. stop if you feel pain - do not overdo it  Try these steps together to help you body heal faster and avoid making things get worse.  Doing just one of these things may not be enough.    If you are not getting better after two weeks or are noticing you are getting worse, contact our office for further  advice; we may need to re-evaluate you & see what other things we can do to help.   

## 2013-06-13 NOTE — Progress Notes (Signed)
Subjective:     Patient ID: Abigail Hall, female   DOB: 10-08-1980, 32 y.o.   MRN: [id number]  HPI  Linde Wilensky  05-08-1981 [id number]  Patient Care Team: [name], MD as PCP - General (Obstetrics and Gynecology) [name], MD as Consulting Physician (Internal Medicine) [name], MD as Consulting Physician (Gastroenterology)  Procedure (Date: 05/31/2013):  POST-OPERATIVE DIAGNOSIS: painful internal/external hemorrhoids   PROCEDURE: Procedure(s):  THD HEMORRHOIDAL LIGATION/PEXY  EXAM UNDER ANESTHESIA  EXTERNAL HEMORRHOIDECTOMY x3  *SURGEON: Surgeon(s):  Ardeth Sportsman, MD   Diagnosis Hemorrhoids, external - FINDINGS CONSISTENT WITH HEMORRHOID TISSUE. Abigail Miyamoto MD Pathologist, Electronic [employer]  This patient returns for surgical re-evaluation.  Had severe pain initially but better now.  Constipated at first but not with small liquid BMs.  Taking liquids only.  Afraid to try solids although admiits BM better after sandwich yesterday.  Not on fiber/Miralax.  Using ibuprofen, warm moisture, and oxycodone.  Refilled x 1.  Wonders if top cram would help - could not get AnaMAntle filled.  Patient Active Problem List   Diagnosis Date Noted  . Constipation, chronic 05/14/2013  . Obesity, Class III, BMI 40-49.9 (morbid obesity) 05/14/2013  . Microcytic anemia 03/02/2013  . UTI (urinary tract infection) 03/02/2013  . Anemia, iron deficiency 04/10/2012  . Hemorrhoids, internal, with bleeding s/p [facility] 05/31/2013 12/13/2011  . External hemorrhoids with pain s/p removal 05/31/2013 12/13/2011  . Anemia 12/13/2011  . Beta thalassemia minor 07/15/2011  . Medication exposure during first trimester of pregnancy 07/15/2011    Past Medical History  Diagnosis Date  . Hemorrhoids   . Rectal bleeding   . Constipation   . Nausea & vomiting     resolved after delivery - C/S, extreme  . Rectal pain   . Weakness     resolved after C/S delivery  .  Generalized headaches   . Anemia   . Thalassemia   . Diabetes mellitus     gestational DM 2013 pregnancy-resolved  . Anemia, iron deficiency 04/10/2012  . Depression 04/26/13    history - no current problems  . Seasonal allergies   . Seizures     last seizure was  08/2012  . History of blood transfusion 02/2013    [facility]  2 units transfused     Past Surgical History  Procedure Laterality Date  . Tongue surgery    . Carpal tunnel release  01/2009    right  . Wisdom tooth extraction    . Cesarean section  12/31/2011    Procedure: CESAREAN SECTION;  Surgeon: Delbert Harness, MD;  Location: [facility];  Service: Gynecology;  Laterality: N/A;  . Laparoscopic tubal ligation  03/27/2012    Procedure: LAPAROSCOPIC TUBAL LIGATION;  Surgeon: Delbert Harness, MD;  Location: [facility];  Service: Gynecology;  Laterality: Bilateral;  fallopian tubes caurtery  . Labioplasty  03/27/2012    Procedure: LABIAPLASTY;  Surgeon: Delbert Harness, MD;  Location: [facility];  Service: Gynecology;  Laterality: N/A;  labia  . Tubal ligation    . Laparoscopy      removal of cyst  . Dilation and curettage of uterus      endometrial ablation  . Abdominal hysterectomy    . Robotic assisted total hysterectomy N/A 05/02/2013    Procedure: ROBOTIC ASSISTED TOTAL HYSTERECTOMY;  Surgeon: [name], MD;  Location: [facility];  Service: Gynecology;  Laterality: N/A;  . Unilateral salpingectomy Right 05/02/2013    Procedure: UNILATERAL SALPINGECTOMY;  Surgeon: [name], MD;  Location: [facility];  Service: Gynecology;  Laterality: Right;  . Transanal hemorrhoidal dearterialization N/A 05/31/2013    Procedure: THD HEMORRHOIDAL LIGATION/PEXY;  Surgeon: Ardeth Sportsman, MD;  Location: [facility];  Service: General;  Laterality: N/A;  . Evaluation under anesthesia with hemorrhoidectomy N/A 05/31/2013    Procedure: EXAM UNDER ANESTHESIA WITH EXTERNAL HEMORRHOIDECTOMY;  Surgeon: Ardeth Sportsman, MD;  Location: [facility];   Service: General;  Laterality: N/A;    History   Social History  . Marital Status: Single    Spouse Name: N/A    Number of Children: N/A  . Years of Education: N/A   Occupational History  . Not on file.   Social History Main Topics  . Smoking status: Never Smoker   . Smokeless tobacco: Never Used  . Alcohol Use: No  . Drug Use: No  . Sexual Activity: Yes    Birth Control/ Protection: None   Other Topics Concern  . Not on file   Social History Narrative  . No narrative on file    Family History  Problem Relation Age of Onset  . Anesthesia problems Neg Hx   . Hypotension Neg Hx   . Malignant hyperthermia Neg Hx   . Pseudochol deficiency Neg Hx   . Diabetes Mother   . Hyperlipidemia Father   . Diabetes Maternal Grandmother   . Cancer Maternal Grandfather   . Hyperlipidemia Paternal Grandfather     Current Outpatient Prescriptions  Medication Sig Dispense Refill  . divalproex (DEPAKOTE ER) 500 MG 24 hr tablet Take 500 mg by mouth 2 (two) times daily.       . folic acid (FOLVITE) 1 MG tablet Take 2 tablets (2 mg total) by mouth daily.  60 tablet  12  . hydrocortisone 2.5 % cream       . ibuprofen (ADVIL,MOTRIN) 800 MG tablet Take 1 tablet (800 mg total) by mouth every 6 (six) hours as needed for pain or fever (mild pain).  40 tablet  1  . Lacosamide (VIMPAT) 100 MG TABS Take 100 mg by mouth 2 (two) times daily.      . metFORMIN (GLUCOPHAGE) 500 MG tablet Take 1,000 mg by mouth every evening.       . polyethylene glycol (MIRALAX / GLYCOLAX) packet Take 17 g by mouth daily.      . vitamin C (ASCORBIC ACID) 500 MG tablet Take 500 mg by mouth daily.      Marland Kitchen zolpidem (AMBIEN) 5 MG tablet Take 2 tablets (10 mg total) by mouth at bedtime as needed. As needed for insomnia  30 tablet  0  . fluticasone (FLONASE) 50 MCG/ACT nasal spray Place 1 spray into the nose daily as needed for allergies.       Marland Kitchen oxyCODONE (OXY IR/ROXICODONE) 5 MG immediate release tablet Take 2 tablets (10  mg total) by mouth every 4 (four) hours as needed for pain.  25 tablet  0   No current facility-administered medications for this visit.     Allergies  Allergen Reactions  . Latex Itching, Swelling and Rash    Where touched.  . Keflex [Cephalexin] Hives    BP 128/80  Pulse 76  Temp(Src) 97.2 F (36.2 C) (Temporal)  Resp 16  Ht 5\' 4"  (1.626 m)  Wt 265 lb (120.203 kg)  BMI 45.46 kg/m2  LMP 04/25/2013  No results found.  Review of Systems  Constitutional: Negative for fever, chills and diaphoresis.  HENT: Negative  for ear pain, sore throat and trouble swallowing.   Eyes: Negative for photophobia and visual disturbance.  Respiratory: Negative for cough and choking.   Cardiovascular: Negative for chest pain and palpitations.  Gastrointestinal: Positive for diarrhea, constipation, anal bleeding and rectal pain. Negative for nausea, vomiting and abdominal pain.  Genitourinary: Negative for dysuria, frequency and difficulty urinating.  Musculoskeletal: Negative for gait problem and myalgias.  Skin: Negative for color change, pallor and rash.  Neurological: Negative for dizziness, speech difficulty, weakness and numbness.  Hematological: Negative for adenopathy.  Psychiatric/Behavioral: Negative for confusion and agitation. The patient is not nervous/anxious.        Objective:   Physical Exam  Constitutional: She is oriented to person, place, and time. She appears well-developed and well-nourished. No distress.  HENT:  Head: Normocephalic.  Mouth/Throat: Oropharynx is clear and moist. No oropharyngeal exudate.  Eyes: Conjunctivae and EOM are normal. Pupils are equal, round, and reactive to light. No scleral icterus.  Neck: Normal range of motion. No tracheal deviation present.  Cardiovascular: Normal rate and intact distal pulses.   Pulmonary/Chest: Effort normal. No respiratory distress. She exhibits no tenderness.  Abdominal: Soft. She exhibits no distension. There is no  tenderness. Hernia confirmed negative in the right inguinal area and confirmed negative in the left inguinal area.  Incisions clean with normal healing ridges.  No hernias  Genitourinary: No vaginal discharge found.  Exam done with assistance of female Medical Assistant in the room.  Perianal skin clean with good hygiene.  No pruritis.  No pilonidal disease.  No fissure.  No abscess/fistula.    No external skin tags / hemorrhoids of significance.  Ext hem wounds granulating.  No digital nor anoscopic rectal exam.  Normal sphincter tone.     Musculoskeletal: Normal range of motion. She exhibits no tenderness.  Lymphadenopathy:       Right: No inguinal adenopathy present.       Left: No inguinal adenopathy present.  Neurological: She is alert and oriented to person, place, and time. No cranial nerve deficit. She exhibits normal muscle tone. Coordination normal.  Skin: Skin is warm and dry. No rash noted. She is not diaphoretic.  Psychiatric: She has a normal mood and affect. Her behavior is normal.       Assessment:     Healing OK only 2 weeks s/p THD int hem ligation & external hemorrhoidectomies.     Plan:     Increase activity as tolerated to regular activity.  Low impact exercise such as walking an hour a day at least ideal.  Do not push through pain.  Heat/ibuprofen/oxycodone.  Anusol HC OK to try but hold if burning/irritating  Diet as tolerated.  Solid in = solid out.  Liquid in = liquid out.  Low fat high fiber diet ideal.  Bowel regimen with 30 g fiber a day and fiber supplement as needed to avoid problems.  Return to clinic 2-3 weeks to make sure wounds heal.   Instructions discussed.  Followup with primary care physician for other health issues as would normally be done.  Questions answered.  The patient expressed understanding and appreciation

## 2013-07-04 ENCOUNTER — Ambulatory Visit (INDEPENDENT_AMBULATORY_CARE_PROVIDER_SITE_OTHER): Payer: Managed Care, Other (non HMO) | Admitting: Surgery

## 2013-07-04 ENCOUNTER — Encounter (INDEPENDENT_AMBULATORY_CARE_PROVIDER_SITE_OTHER): Payer: Self-pay | Admitting: Surgery

## 2013-07-04 VITALS — BP 142/68 | HR 84 | Resp 20 | Ht 64.0 in | Wt 266.4 lb

## 2013-07-04 DIAGNOSIS — K5909 Other constipation: Secondary | ICD-10-CM

## 2013-07-04 DIAGNOSIS — K5900 Constipation, unspecified: Secondary | ICD-10-CM

## 2013-07-04 DIAGNOSIS — K644 Residual hemorrhoidal skin tags: Secondary | ICD-10-CM

## 2013-07-04 DIAGNOSIS — K648 Other hemorrhoids: Secondary | ICD-10-CM

## 2013-07-04 NOTE — Patient Instructions (Signed)
GETTING TO GOOD BOWEL HEALTH. Irregular bowel habits such as constipation and diarrhea can lead to many problems over time.  Having one soft bowel movement a day is the most important way to prevent further problems.  The anorectal canal is designed to handle stretching and feces to safely manage our ability to get rid of solid waste (feces, poop, stool) out of our body.  BUT, hard constipated stools can act like ripping concrete bricks and diarrhea can be a burning fire to this very sensitive area of our body, causing inflamed hemorrhoids, anal fissures, increasing risk is perirectal abscesses, abdominal pain/bloating, an making irritable bowel worse.     The goal: ONE SOFT BOWEL MOVEMENT A DAY!  To have soft, regular bowel movements:    Drink at least 8 tall glasses of water a day.     Take plenty of fiber.  Fiber is the undigested part of plant food that passes into the colon, acting s "natures broom" to encourage bowel motility and movement.  Fiber can absorb and hold large amounts of water. This results in a larger, bulkier stool, which is soft and easier to pass. Work gradually over several weeks up to 6 servings a day of fiber (25g a day even more if needed) in the form of: o Vegetables -- Root (potatoes, carrots, turnips), leafy green (lettuce, salad greens, celery, spinach), or cooked high residue (cabbage, broccoli, etc) o Fruit -- Fresh (unpeeled skin & pulp), Dried (prunes, apricots, cherries, etc ),  or stewed ( applesauce)  o Whole grain breads, pasta, etc (whole wheat)  o Bran cereals    Bulking Agents -- This type of water-retaining fiber generally is easily obtained each day by one of the following:  o Psyllium bran -- The psyllium plant is remarkable because its ground seeds can retain so much water. This product is available as Metamucil, Konsyl, Effersyllium, Per Diem Fiber, or the less expensive generic preparation in drug and health food stores. Although labeled a laxative, it really  is not a laxative.  o Methylcellulose -- This is another fiber derived from wood which also retains water. It is available as Citrucel. o Polyethylene Glycol - and "artificial" fiber commonly called Miralax or Glycolax.  It is helpful for people with gassy or bloated feelings with regular fiber o Flax Seed - a less gassy fiber than psyllium   No reading or other relaxing activity while on the toilet. If bowel movements take longer than 5 minutes, you are too constipated   AVOID CONSTIPATION.  High fiber and water intake usually takes care of this.  Sometimes a laxative is needed to stimulate more frequent bowel movements, but    Laxatives are not a good long-term solution as it can wear the colon out. o Osmotics (Milk of Magnesia, Fleets phosphosoda, Magnesium citrate, MiraLax, GoLytely) are safer than  o Stimulants (Senokot, Castor Oil, Dulcolax, Ex Lax)    o Do not take laxatives for more than 7days in a row.    IF SEVERELY CONSTIPATED, try a Bowel Retraining Program: o Do not use laxatives.  o Eat a diet high in roughage, such as bran cereals and leafy vegetables.  o Drink six (6) ounces of prune or apricot juice each morning.  o Eat two (2) large servings of stewed fruit each day.  o Take one (1) heaping tablespoon of a psyllium-based bulking agent twice a day. Use sugar-free sweetener when possible to avoid excessive calories.  o Eat a normal breakfast.  o   Set aside 15 minutes after breakfast to sit on the toilet, but do not strain to have a bowel movement.  o If you do not have a bowel movement by the third day, use an enema and repeat the above steps.    Controlling diarrhea o Switch to liquids and simpler foods for a few days to avoid stressing your intestines further. o Avoid dairy products (especially milk & ice cream) for a short time.  The intestines often can lose the ability to digest lactose when stressed. o Avoid foods that cause gassiness or bloating.  Typical foods include  beans and other legumes, cabbage, broccoli, and dairy foods.  Every person has some sensitivity to other foods, so listen to our body and avoid those foods that trigger problems for you. o Adding fiber (Citrucel, Metamucil, psyllium, Miralax) gradually can help thicken stools by absorbing excess fluid and retrain the intestines to act more normally.  Slowly increase the dose over a few weeks.  Too much fiber too soon can backfire and cause cramping & bloating. o Probiotics (such as active yogurt, Align, etc) may help repopulate the intestines and colon with normal bacteria and calm down a sensitive digestive tract.  Most studies show it to be of mild help, though, and such products can be costly. o Medicines:   Bismuth subsalicylate (ex. Kayopectate, Pepto Bismol) every 30 minutes for up to 6 doses can help control diarrhea.  Avoid if pregnant.   Loperamide (Immodium) can slow down diarrhea.  Start with two tablets (4mg  total) first and then try one tablet every 6 hours.  Avoid if you are having fevers or severe pain.  If you are not better or start feeling worse, stop all medicines and call your doctor for advice o Call your doctor if you are getting worse or not better.  Sometimes further testing (cultures, endoscopy, X-ray studies, bloodwork, etc) may be needed to help diagnose and treat the cause of the diarrhea.  HEMORRHOIDS  The rectum is the last foot of your colon, and it naturally stretches to hold stool.  Hemorrhoidal piles are natural clusters of blood vessels that help the rectum and anal canal stretch to hold stool and allow bowel movements to eliminate feces.   Hemorrhoids are abnormally swollen blood vessels in the rectum.  Too much pressure in the rectum causes hemorrhoids by forcing blood to stretch and bulge the walls of the veins, sometimes even rupturing them.  Hemorrhoids can become like varicose veins you might see on a person's legs.  Most people will develop a flare of hemorrhoids  in their lifetime.  When bulging hemorrhoidal veins are irritated, they can swell, burn, itch, cause pain, and bleed.  Most flares will calm down gradually own within a few weeks.  However, once hemorrhoids are created, they are difficult to get rid of completely and tend to flare more easily than the first flare.   Fortunately, good habits and simple medical treatment usually control hemorrhoids well, and surgery is needed only in severe cases. Types of Hemorrhoids:  Internal hemorrhoids usually don't initially hurt or itch; they are deep inside the rectum and usually have no sensation. If they begin to push out (prolapse), pain and burning can occur.  However, internal hemorrhoids can bleed.  Anal bleeding should not be ignored since bleeding could come from a dangerous source like colorectal cancer, so persistent rectal bleeding should be investigated by a doctor, sometimes with a colonoscopy.  External hemorrhoids cause most of the symptoms -  pain, burning, and itching. Nonirritated hemorrhoids can look like small skin tags coming out of the anus.   Thrombosed hemorrhoids can form when a hemorrhoid blood vessel bursts and causes the hemorrhoid to suddenly swell.  A purple blood clot can form in it and become an excruciatingly painful lump at the anus. Because of these unpleasant symptoms, immediate incision and drainage by a surgeon at an office visit can provide much relief of the pain.    PREVENTION Avoiding the most frequent causes listed below will prevent most cases of hemorrhoids: Constipation Hard stools Diarrhea  Constant sitting  Straining with bowel movements Sitting on the toilet for a long time  Severe coughing  episodes Pregnancy / Childbirth  Heavy Lifting  Sometimes avoiding the above triggers is difficult:  How can you avoid sitting all day if you have a seated job? Also, we try to avoid coughing and diarrhea, but sometimes it's beyond your control.  Still, there are some  practical hints to help: Keep the anal and genital area clean.  Moistened tissues such as flushable wet wipes are less irritating than toilet paper.  Using irrigating showers or bottle irrigation washing gently cleans this sensitive area.   Avoid dry toilet paper when cleaning after bowel movements.  Marland Kitchen Keep the anal and genital area dry.  Lightly pat the rectal area dry.  Avoid rubbing.  Talcum or baby powders can help GET YOUR STOOLS SOFT.   This is the most important way to prevent irritated hemorrhoids.  Hard stools are like sandpaper to the anorectal canal and will cause more problems.  The goal: ONE SOFT BOWEL MOVEMENT A DAY!  BMs from every other day to 3 times a day is a tolerable range Treat coughing, diarrhea and constipation early since irritated hemorrhoids may soon follow.  If your main job activity is seated, always stand or walk during your breaks. Make it a point to stand and walk at least 5 minutes every hour and try to shift frequently in your chair to avoid direct rectal pressure.  Always exhale as you strain or lift. Don't hold your breath.  Do not delay or try to prevent a bowel movement when the urge is present. Exercise regularly (walking or jogging 60 minutes a day) to stimulate the bowels to move. No reading or other activity while on the toilet. If bowel movements take longer than 5 minutes, you are too constipated. AVOID CONSTIPATION Drink plenty of liquids (1 1/2 to 2 quarts of water and other fluids a day unless fluid restricted for another medical condition). Liquids that contain caffeine (coffee a, tea, soft drinks) can be dehydrating and should be avoided until constipation is controlled. Consider minimizing milk, as dairy products may be constipating. Eat plenty of fiber (30g a day ideal, more if needed).  Fiber is the undigested part of plant food that passes into the colon, acting as "natures broom" to encourage bowel motility and movement.  Fiber can absorb and hold  large amounts of water. This results in a larger, bulkier stool, which is soft and easier to pass.  Eating foods high in fiber - 12 servings - such as  Vegetables: Root (potatoes, carrots, turnips), Leafy green (lettuce, salad greens, celery, spinach), High residue (cabbage, broccoli, etc.) Fruit: Fresh, Dried (prunes, apricots, cherries), Stewed (applesauce)  Whole grain breads, pasta, whole wheat Bran cereals, muffins, etc. Consider adding supplemental bulking fiber which retains large volumes of water: Psyllium ground seeds (native plant from [place])--available as Metamucil, Konsyl,  Effersyllium, Per Diem Fiber, or the less expensive generic forms.  Citrucel  (methylcellulose wood fiber) . FiberCon (Polycarbophil) Polyethylene Glycol - and "artificial" fiber commonly called Miralax or Glycolax.  It is helpful for people with gassy or bloated feelings with regular fiber Flax Seed - a less gassy natural fiber  Laxatives can be useful for a short period if constipation is severe Osmotics (Milk of Magnesia, Fleets Phospho-Soda, Magnesium Citrate)  Stimulants (Senokot,   Castor Oil,  Dulcolax, Ex-Lax)    Laxatives are not a good long-term solution as it can stress the bowels and cause too much mineral loss and dehydration.   Avoid taking laxatives for more than 7 days in a row.  AVOID DIARRHEA Switch to liquids and simpler foods for a few days to avoid stressing your intestines further. Avoid dairy products (especially milk & ice cream) for a short time.  The intestines often can lose the ability to digest lactose when stressed. Avoid foods that cause gassiness or bloating.  Typical foods include beans and other legumes, cabbage, broccoli, and dairy foods.  Every person has some sensitivity to other foods, so listen to your body and avoid those foods that trigger problems for you. Adding fiber (Citrucel, Metamucil, FiberCon, Flax seed, Miralax) gradually can help thicken stools by absorbing  excess fluid and retrain the intestines to act more normally.  Slowly increase the dose over a few weeks.  Too much fiber too soon can backfire and cause cramping & bloating. Probiotics (such as active yogurt, Align, etc) may help repopulate the intestines and colon with normal bacteria and calm down a sensitive digestive tract.  Most studies show it to be of mild help, though, and such products can be costly. Medicines: Bismuth subsalicylate (ex. Kayopectate, Pepto Bismol) every 30 minutes for up to 6 doses can help control diarrhea.  Avoid if pregnant. Loperamide (Immodium) can slow down diarrhea.  Start with two tablets (4mg  total) first and then try one tablet every 6 hours.  Avoid if you are having fevers or severe pain.  If you are not better or start feeling worse, stop all medicines and call your doctor for advice Call your doctor if you are getting worse or not better.  Sometimes further testing (cultures, endoscopy, X-ray studies, bloodwork, etc) may be needed to help diagnose and treat the cause of the diarrhea. TREATMENT OF HEMORRHOID FLARE If these preventive measures fail, you must take action right away! Hemorrhoids are one condition that can be mild in the morning and become intolerable by nightfall. Most hemorrhoidal flares take several weeks to calm down.  These suggestions can help: Warm soaks.  This helps more than any topical medication.  Use up to 8 times a day.  Usually sitz baths or sitting in a warm bathtub helps.  Sitting on moist warm towels are helpful.  Switching to ice packs/cool compresses can be helpful  Use a Sitz Bath 4-8 times a day for relief A sitz bath is a warm water bath taken in the sitting position that covers only the hips and buttocks. It may be used for either healing or hygiene purposes. Sitz baths are also used to relieve pain, itching, or muscle spasms. The water may contain medicine. Moist heat will help you heal and relax.  HOME CARE INSTRUCTIONS  Take  3 to 4 sitz baths a day. 1. Fill the bathtub half full with warm water. 2. Sit in the water and open the drain a little. 3. Turn on the warm  water to keep the tub half full. Keep the water running constantly. 4. Soak in the water for 15 to 20 minutes. 5. After the sitz bath, pat the affected area dry first. SEEK MEDICAL CARE IF:  You get worse instead of better. Stop the sitz baths if you get worse.  Normalize your bowels.  Extremes of diarrhea or constipation will make hemorrhoids worse.  One soft bowel movement a day is the goal.  Fiber can help get your bowels regular Wet wipes instead of toilet paper Pain control with a NSAID such as ibuprofen (Advil) or naproxen (Aleve) or acetaminophen (Tylenol) around the clock.  Narcotics are constipating and should be minimized if possible Topical creams contain steroids (bydrocortisone) or local anesthetic (xylocaine) can help make pain and itching more tolerable.   EVALUATION If hemorrhoids are still causing problems, you could benefit by an evaluation by a surgeon.  The surgeon will obtain a history and examine you.  If hemorrhoids are diagnosed, some therapies can be offered in the office, usually with an anoscope into the less sensitive area of the rectum: -injection of hemorrhoids (sclerotherapy) can scar the blood vessels of the swollen/enlarged hemorrhoids to help shrink them down to a more normal size -rubber banding of the enlarged hemorrhoids to help shrink them down to a more normal size -drainage of the blood clot causing a thrombosed hemorrhoid,  to relieve the severe pain   While 90% of the time such problems from hemorrhoids can be managed without preceding to surgery, sometimes the hemorrhoids require a operation to control the problem (uncontrolled bleeding, prolapse, pain, etc.).   This involves being placed under general anesthesia where the surgeon can confirm the diagnosis and remove, suture, or staple the hemorrhoid(s).  Your  surgeon can help you treat the problem appropriately.    

## 2013-07-04 NOTE — Progress Notes (Signed)
Subjective:     Patient ID: Abigail Hall, female   DOB: [birth year], 32 y.o.   MRN: [id number]  HPI   Abigail Hall  [birth year] [id number]  Patient Care Team: [name], MD as PCP - General (Obstetrics and Gynecology) [name], MD as Consulting Physician (Internal Medicine) [name], MD as Consulting Physician (Gastroenterology)  Procedure (Date: 05/31/2013):  POST-OPERATIVE DIAGNOSIS: painful internal/external hemorrhoids   PROCEDURE: Procedure(s):  THD HEMORRHOIDAL LIGATION/PEXY  EXAM UNDER ANESTHESIA  EXTERNAL HEMORRHOIDECTOMY x3  *SURGEON: Surgeon(s):  Ardeth Sportsman, MD   Diagnosis Hemorrhoids, external - FINDINGS CONSISTENT WITH HEMORRHOID TISSUE. Abigail Miyamoto MD Pathologist, Electronic Signature  This patient returns for surgical re-evaluation.  Pain much less -  better now.  On the solid diet.  Eating plenty of vegetables and fiber.  Taking MiraLax.  Having 3-4 mostly formed bowel movements a day.  Some hyper flatulence that she feels is embarrassing.  However, that she is not having constipation anymore.  Think she feels a small lump around the anus, ?new hemorrhoid?  Patient Active Problem List   Diagnosis Date Noted  . Constipation, chronic 05/14/2013  . Obesity, Class III, BMI 40-49.9 (morbid obesity) 05/14/2013  . Microcytic anemia 03/02/2013  . UTI (urinary tract infection) 03/02/2013  . Anemia, iron deficiency 04/10/2012  . Hemorrhoids, internal, with bleeding s/p [facility] 05/31/2013 12/13/2011  . External hemorrhoids with pain s/p removal 05/31/2013 12/13/2011  . Anemia 12/13/2011  . Beta thalassemia minor 07/15/2011  . Medication exposure during first trimester of pregnancy 07/15/2011    Past Medical History  Diagnosis Date  . Hemorrhoids   . Rectal bleeding   . Constipation   . Nausea & vomiting     resolved after delivery - C/S, extreme  . Rectal pain   . Weakness     resolved after C/S delivery  . Generalized  headaches   . Anemia   . Thalassemia   . Diabetes mellitus     gestational DM 2013 pregnancy-resolved  . Anemia, iron deficiency 04/10/2012  . Depression 04/26/13    history - no current problems  . Seasonal allergies   . Seizures     last seizure was  08/2012  . History of blood transfusion 02/2013    [facility]  2 units transfused     Past Surgical History  Procedure Laterality Date  . Tongue surgery    . Carpal tunnel release  01/2009    right  . Wisdom tooth extraction    . Cesarean section  12/31/2011    Procedure: CESAREAN SECTION;  Surgeon: Delbert Harness, MD;  Location: [facility];  Service: Gynecology;  Laterality: N/A;  . Laparoscopic tubal ligation  03/27/2012    Procedure: LAPAROSCOPIC TUBAL LIGATION;  Surgeon: Delbert Harness, MD;  Location: [facility];  Service: Gynecology;  Laterality: Bilateral;  fallopian tubes caurtery  . Labioplasty  03/27/2012    Procedure: LABIAPLASTY;  Surgeon: Delbert Harness, MD;  Location: [facility];  Service: Gynecology;  Laterality: N/A;  labia  . Tubal ligation    . Laparoscopy      removal of cyst  . Dilation and curettage of uterus      endometrial ablation  . Abdominal hysterectomy    . Robotic assisted total hysterectomy N/A 05/02/2013    Procedure: ROBOTIC ASSISTED TOTAL HYSTERECTOMY;  Surgeon: [name], MD;  Location: [facility];  Service: Gynecology;  Laterality: N/A;  . Unilateral salpingectomy Right 05/02/2013    Procedure: UNILATERAL  SALPINGECTOMY;  Surgeon: [name], MD;  Location: [facility];  Service: Gynecology;  Laterality: Right;  . Transanal hemorrhoidal dearterialization N/A 05/31/2013    Procedure: THD HEMORRHOIDAL LIGATION/PEXY;  Surgeon: Ardeth Sportsman, MD;  Location: [facility];  Service: General;  Laterality: N/A;  . Evaluation under anesthesia with hemorrhoidectomy N/A 05/31/2013    Procedure: EXAM UNDER ANESTHESIA WITH EXTERNAL HEMORRHOIDECTOMY;  Surgeon: Ardeth Sportsman, MD;  Location: [facility];  Service: General;   Laterality: N/A;    History   Social History  . Marital Status: Single    Spouse Name: N/A    Number of Children: N/A  . Years of Education: N/A   Occupational History  . Not on file.   Social History Main Topics  . Smoking status: Never Smoker   . Smokeless tobacco: Never Used  . Alcohol Use: No  . Drug Use: No  . Sexual Activity: Yes    Birth Control/ Protection: None   Other Topics Concern  . Not on file   Social History Narrative  . No narrative on file    Family History  Problem Relation Age of Onset  . Anesthesia problems Neg Hx   . Hypotension Neg Hx   . Malignant hyperthermia Neg Hx   . Pseudochol deficiency Neg Hx   . Diabetes Mother   . Hyperlipidemia Father   . Diabetes Maternal Grandmother   . Cancer Maternal Grandfather   . Hyperlipidemia Paternal Grandfather     Current Outpatient Prescriptions  Medication Sig Dispense Refill  . divalproex (DEPAKOTE ER) 500 MG 24 hr tablet Take 500 mg by mouth 2 (two) times daily.       . fluticasone (FLONASE) 50 MCG/ACT nasal spray Place 1 spray into the nose daily as needed for allergies.       . folic acid (FOLVITE) 1 MG tablet Take 2 tablets (2 mg total) by mouth daily.  60 tablet  12  . hydrocortisone (ANUSOL-HC) 2.5 % rectal cream Place rectally 2 (two) times daily.  30 g  0  . hydrocortisone 2.5 % cream       . ibuprofen (ADVIL,MOTRIN) 800 MG tablet Take 1 tablet (800 mg total) by mouth every 6 (six) hours as needed for pain or fever (mild pain).  40 tablet  1  . Lacosamide (VIMPAT) 100 MG TABS Take 100 mg by mouth 2 (two) times daily.      . metFORMIN (GLUCOPHAGE) 500 MG tablet Take 1,000 mg by mouth every evening.       Marland Kitchen oxyCODONE (OXY IR/ROXICODONE) 5 MG immediate release tablet Take 1-2 tablets (5-10 mg total) by mouth every 6 (six) hours as needed for pain.  40 tablet  0  . polyethylene glycol (MIRALAX / GLYCOLAX) packet Take 17 g by mouth daily.      . vitamin C (ASCORBIC ACID) 500 MG tablet Take 500  mg by mouth daily.      Marland Kitchen zolpidem (AMBIEN) 5 MG tablet Take 2 tablets (10 mg total) by mouth at bedtime as needed. As needed for insomnia  30 tablet  0   No current facility-administered medications for this visit.     Allergies  Allergen Reactions  . Latex Itching, Swelling and Rash    Where touched.  . Keflex [Cephalexin] Hives    BP 142/68  Pulse 84  Resp 20  Ht 5\' 4"  (1.626 m)  Wt 266 lb 6.4 oz (120.838 kg)  BMI 45.70 kg/m2  LMP 04/25/2013  No results  found.  Review of Systems  Constitutional: Negative for fever, chills and diaphoresis.  HENT: Negative for ear pain, sore throat and trouble swallowing.   Eyes: Negative for photophobia and visual disturbance.  Respiratory: Negative for cough and choking.   Cardiovascular: Negative for chest pain and palpitations.  Gastrointestinal: Positive for anal bleeding and rectal pain. Negative for nausea, vomiting, abdominal pain, diarrhea and constipation.       Blood only occ w wipe - improved  Genitourinary: Negative for dysuria, frequency and difficulty urinating.  Musculoskeletal: Negative for gait problem and myalgias.  Skin: Negative for color change, pallor and rash.  Neurological: Negative for dizziness, speech difficulty, weakness and numbness.  Hematological: Negative for adenopathy.  Psychiatric/Behavioral: Negative for confusion and agitation. The patient is not nervous/anxious.        Objective:   Physical Exam  Constitutional: She is oriented to person, place, and time. She appears well-developed and well-nourished. No distress.  HENT:  Head: Normocephalic.  Mouth/Throat: Oropharynx is clear and moist. No oropharyngeal exudate.  Eyes: Conjunctivae and EOM are normal. Pupils are equal, round, and reactive to light. No scleral icterus.  Neck: Normal range of motion. No tracheal deviation present.  Cardiovascular: Normal rate and intact distal pulses.   Pulmonary/Chest: Effort normal. No respiratory distress. She  exhibits no tenderness.  Abdominal: Soft. She exhibits no distension. There is no tenderness. Hernia confirmed negative in the right inguinal area and confirmed negative in the left inguinal area.  Incisions clean with normal healing ridges.  No hernias  Genitourinary: No vaginal discharge found.  Exam done with assistance of female Medical Assistant in the room.  Perianal skin clean with good hygiene.  No pruritis.  No pilonidal disease.  No fissure.  No abscess/fistula.    Small external skin tag.  No other hemorrhoids.  Ext hem wounds granulating.  No digital nor anoscopic rectal exam today.  Normal sphincter tone.     Musculoskeletal: Normal range of motion. She exhibits no tenderness.  Lymphadenopathy:       Right: No inguinal adenopathy present.       Left: No inguinal adenopathy present.  Neurological: She is alert and oriented to person, place, and time. No cranial nerve deficit. She exhibits normal muscle tone. Coordination normal.  Skin: Skin is warm and dry. No rash noted. She is not diaphoretic.  Psychiatric: She has a normal mood and affect. Her behavior is normal.  Smiling       Assessment:     Healing better 1 month s/p THD int hem ligation & external hemorrhoidectomies.     Plan:     Increase activity as tolerated to regular activity.  Low impact exercise such as walking an hour a day at least ideal.  Do not push through pain.   Diet as tolerated.  Low fat high fiber diet ideal.  Bowel regimen with 30 g fiber a day and fiber supplement as needed to avoid problems.  Consider using MiraLax only or switching to flaxseed given problems with hyper flatulence.  Small anal tag.  Hopefully will shrink down over time.    Return to clinic 1 month to make sure wounds heal.   Instructions discussed.  Followup with primary care physician for other health issues as would normally be done.  Questions answered.  The patient expressed understanding and appreciation

## 2013-08-01 ENCOUNTER — Encounter (INDEPENDENT_AMBULATORY_CARE_PROVIDER_SITE_OTHER): Payer: Managed Care, Other (non HMO) | Admitting: Surgery

## 2013-08-13 ENCOUNTER — Encounter (INDEPENDENT_AMBULATORY_CARE_PROVIDER_SITE_OTHER): Payer: Self-pay | Admitting: Surgery

## 2013-08-13 ENCOUNTER — Encounter (INDEPENDENT_AMBULATORY_CARE_PROVIDER_SITE_OTHER): Payer: Self-pay

## 2013-08-13 ENCOUNTER — Ambulatory Visit (INDEPENDENT_AMBULATORY_CARE_PROVIDER_SITE_OTHER): Payer: Managed Care, Other (non HMO) | Admitting: Surgery

## 2013-08-13 VITALS — BP 132/78 | HR 67 | Temp 98.0°F | Resp 18 | Ht 64.0 in | Wt 271.0 lb

## 2013-08-13 DIAGNOSIS — K644 Residual hemorrhoidal skin tags: Secondary | ICD-10-CM

## 2013-08-13 DIAGNOSIS — K648 Other hemorrhoids: Secondary | ICD-10-CM

## 2013-08-13 DIAGNOSIS — K5909 Other constipation: Secondary | ICD-10-CM

## 2013-08-13 DIAGNOSIS — K5900 Constipation, unspecified: Secondary | ICD-10-CM

## 2013-08-13 NOTE — Progress Notes (Signed)
Subjective:     Patient ID: Abigail Hall, female   DOB: 04/16/1981, 32 y.o.   MRN: [id number]  HPI   Abigail Hall  [birth year] [id number]  Patient Care Team: Abigail Kyle, Hall as PCP - General (Obstetrics and Gynecology) Abigail Macho, Hall as Consulting Physician (Internal Medicine) Abigail Belfast, Hall as Consulting Physician (Gastroenterology)  Procedure (Date: 05/31/2013):  POST-OPERATIVE DIAGNOSIS: painful internal/external hemorrhoids   PROCEDURE: Procedure(s):  THD HEMORRHOIDAL LIGATION/PEXY  EXAM UNDER ANESTHESIA  EXTERNAL HEMORRHOIDECTOMY x3  *SURGEON: Surgeon(s):  Abigail Sportsman, Hall   Diagnosis Hemorrhoids, external - FINDINGS CONSISTENT WITH HEMORRHOID TISSUE. Abigail Hall Pathologist, Electronic Signature  This patient returns for surgical re-evaluation.  Pain is gone.  Eating plenty of vegetables and fiber.  Taking MiraLax & flax seed.  She still struggles with some fluctuance, but it is a little better.  Having 2 bowel movements a day.    Patient Active Problem List   Diagnosis Date Noted  . Constipation, chronic 05/14/2013  . Obesity, Class III, BMI 40-49.9 (morbid obesity) 05/14/2013  . Microcytic anemia 03/02/2013  . UTI (urinary tract infection) 03/02/2013  . Anemia, iron deficiency 04/10/2012  . Hemorrhoids, internal, with bleeding s/p [facility] 05/31/2013 12/13/2011  . External hemorrhoids with pain s/p removal 05/31/2013 12/13/2011  . Anemia 12/13/2011  . Beta thalassemia minor 07/15/2011  . Medication exposure during first trimester of pregnancy 07/15/2011    Past Medical History  Diagnosis Date  . Hemorrhoids   . Rectal bleeding   . Constipation   . Nausea & vomiting     resolved after delivery - C/S, extreme  . Rectal pain   . Weakness     resolved after C/S delivery  . Generalized headaches   . Anemia   . Thalassemia   . Diabetes mellitus     gestational DM 2013 pregnancy-resolved  . Anemia, iron deficiency 04/10/2012   . Depression 04/26/13    history - no current problems  . Seasonal allergies   . Seizures     last seizure was  08/2012  . History of blood transfusion 02/2013    [facility]  2 units transfused     Past Surgical History  Procedure Laterality Date  . Tongue surgery    . Carpal tunnel release  01/2009    right  . Wisdom tooth extraction    . Cesarean section  12/31/2011    Procedure: CESAREAN SECTION;  Surgeon: Delbert Harness, Hall;  Location: [facility];  Service: Gynecology;  Laterality: N/A;  . Laparoscopic tubal ligation  03/27/2012    Procedure: LAPAROSCOPIC TUBAL LIGATION;  Surgeon: Delbert Harness, Hall;  Location: [facility];  Service: Gynecology;  Laterality: Bilateral;  fallopian tubes caurtery  . Labioplasty  03/27/2012    Procedure: LABIAPLASTY;  Surgeon: Delbert Harness, Hall;  Location: [facility];  Service: Gynecology;  Laterality: N/A;  labia  . Tubal ligation    . Laparoscopy      removal of cyst  . Dilation and curettage of uterus      endometrial ablation  . Abdominal hysterectomy    . Robotic assisted total hysterectomy N/A 05/02/2013    Procedure: ROBOTIC ASSISTED TOTAL HYSTERECTOMY;  Surgeon: Abigail Kyle, Hall;  Location: [facility];  Service: Gynecology;  Laterality: N/A;  . Unilateral salpingectomy Right 05/02/2013    Procedure: UNILATERAL SALPINGECTOMY;  Surgeon: Abigail Kyle, Hall;  Location: [facility];  Service: Gynecology;  Laterality: Right;  . Transanal hemorrhoidal dearterialization N/A  05/31/2013    Procedure: THD HEMORRHOIDAL LIGATION/PEXY;  Surgeon: Abigail Sportsman, Hall;  Location: [facility];  Service: General;  Laterality: N/A;  . Evaluation under anesthesia with hemorrhoidectomy N/A 05/31/2013    Procedure: EXAM UNDER ANESTHESIA WITH EXTERNAL HEMORRHOIDECTOMY;  Surgeon: Abigail Sportsman, Hall;  Location: [facility];  Service: General;  Laterality: N/A;    History   Social History  . Marital Status: Single    Spouse Name: N/A    Number of Children: N/A  . Years of  Education: N/A   Occupational History  . Not on file.   Social History Main Topics  . Smoking status: Never Smoker   . Smokeless tobacco: Never Used  . Alcohol Use: No  . Drug Use: No  . Sexual Activity: Yes    Birth Control/ Protection: None   Other Topics Concern  . Not on file   Social History Narrative  . No narrative on file    Family History  Problem Relation Age of Onset  . Anesthesia problems Neg Hx   . Hypotension Neg Hx   . Malignant hyperthermia Neg Hx   . Pseudochol deficiency Neg Hx   . Diabetes Mother   . Hyperlipidemia Father   . Diabetes Maternal Grandmother   . Cancer Maternal Grandfather   . Hyperlipidemia Paternal Grandfather     Current Outpatient Prescriptions  Medication Sig Dispense Refill  . divalproex (DEPAKOTE ER) 500 MG 24 hr tablet Take 500 mg by mouth 2 (two) times daily.       . fluticasone (FLONASE) 50 MCG/ACT nasal spray Place 1 spray into the nose daily as needed for allergies.       . folic acid (FOLVITE) 1 MG tablet Take 2 tablets (2 mg total) by mouth daily.  60 tablet  12  . hydrocortisone (ANUSOL-HC) 2.5 % rectal cream Place rectally 2 (two) times daily.  30 g  0  . hydrocortisone 2.5 % cream       . ibuprofen (ADVIL,MOTRIN) 800 MG tablet Take 1 tablet (800 mg total) by mouth every 6 (six) hours as needed for pain or fever (mild pain).  40 tablet  1  . Lacosamide (VIMPAT) 100 MG TABS Take 100 mg by mouth 2 (two) times daily.      . metFORMIN (GLUCOPHAGE) 500 MG tablet Take 1,000 mg by mouth every evening.       Marland Kitchen oxyCODONE (OXY IR/ROXICODONE) 5 MG immediate release tablet Take 1-2 tablets (5-10 mg total) by mouth every 6 (six) hours as needed for pain.  40 tablet  0  . polyethylene glycol (MIRALAX / GLYCOLAX) packet Take 17 g by mouth daily.      . vitamin C (ASCORBIC ACID) 500 MG tablet Take 500 mg by mouth daily.      Marland Kitchen zolpidem (AMBIEN) 5 MG tablet Take 2 tablets (10 mg total) by mouth at bedtime as needed. As needed for insomnia   30 tablet  0   No current facility-administered medications for this visit.     Allergies  Allergen Reactions  . Latex Itching, Swelling and Rash    Where touched.  . Keflex [Cephalexin] Hives    BP 132/78  Pulse 67  Temp(Src) 98 F (36.7 C)  Resp 18  Ht 5\' 4"  (1.626 m)  Wt 271 lb (122.925 kg)  BMI 46.49 kg/m2  LMP 04/25/2013  No results found.  Review of Systems  Constitutional: Negative for fever, chills and diaphoresis.  HENT: Negative for ear pain,  sore throat and trouble swallowing.   Eyes: Negative for photophobia and visual disturbance.  Respiratory: Negative for cough and choking.   Cardiovascular: Negative for chest pain and palpitations.  Gastrointestinal: Positive for anal bleeding and rectal pain. Negative for nausea, vomiting, abdominal pain, diarrhea and constipation.       Blood only occ w wipe - improved  Genitourinary: Negative for dysuria, frequency and difficulty urinating.  Musculoskeletal: Negative for gait problem and myalgias.  Skin: Negative for color change, pallor and rash.  Neurological: Negative for dizziness, speech difficulty, weakness and numbness.  Hematological: Negative for adenopathy.  Psychiatric/Behavioral: Negative for confusion and agitation. The patient is not nervous/anxious.        Objective:   Physical Exam  Constitutional: She is oriented to person, place, and time. She appears well-developed and well-nourished. No distress.  HENT:  Head: Normocephalic.  Mouth/Throat: Oropharynx is clear and moist. No oropharyngeal exudate.  Eyes: Conjunctivae and EOM are normal. Pupils are equal, round, and reactive to light. No scleral icterus.  Neck: Normal range of motion. No tracheal deviation present.  Cardiovascular: Normal rate and intact distal pulses.   Pulmonary/Chest: Effort normal. No respiratory distress. She exhibits no tenderness.  Abdominal: Soft. She exhibits no distension. There is no tenderness. Hernia confirmed  negative in the right inguinal area and confirmed negative in the left inguinal area.  Incisions clean with normal healing ridges.  No hernias  Genitourinary: No vaginal discharge found.  Exam done with assistance of female Medical Assistant in the room.  Perianal skin clean with good hygiene.  No pruritis.  No pilonidal disease.  No fissure.  No abscess/fistula.    Small external skin tags R post/L lat.  Normal sphincter tone.     Musculoskeletal: Normal range of motion. She exhibits no tenderness.  Lymphadenopathy:       Right: No inguinal adenopathy present.       Left: No inguinal adenopathy present.  Neurological: She is alert and oriented to person, place, and time. No cranial nerve deficit. She exhibits normal muscle tone. Coordination normal.  Skin: Skin is warm and dry. No rash noted. She is not diaphoretic.  Psychiatric: She has a normal mood and affect. Her behavior is normal.  Smiling       Assessment:     Healing better 2 month s/p THD int hem ligation & external hemorrhoidectomies, recovering well     Plan:     Increase activity as tolerated to regular activity.  Low impact exercise such as walking an hour a day at least ideal.  Do not push through pain.   Diet as tolerated.  Low fat high fiber diet ideal.  Bowel regimen with 30 g fiber a day and fiber supplement as needed to avoid problems.  Consider using MiraLax only or switching to flaxseed given problems with hyper flatulence.  Small anal tags x2.   If they do worsen or bother her, to do external hemorrhoidectomy x2.  They are not bothering her, so I would leave it alone for now.  She feels reassured.   Return to clinic as needed.   Instructions discussed.  Followup with primary care physician for other health issues as would normally be done.  Questions answered.  The patient expressed understanding and appreciation

## 2013-08-13 NOTE — Patient Instructions (Signed)
HEMORRHOIDS  The rectum is the last foot of your colon, and it naturally stretches to hold stool.  Hemorrhoidal piles are natural clusters of blood vessels that help the rectum and anal canal stretch to hold stool and allow bowel movements to eliminate feces.   Hemorrhoids are abnormally swollen blood vessels in the rectum.  Too much pressure in the rectum causes hemorrhoids by forcing blood to stretch and bulge the walls of the veins, sometimes even rupturing them.  Hemorrhoids can become like varicose veins you might see on a person's legs.  Most people will develop a flare of hemorrhoids in their lifetime.  When bulging hemorrhoidal veins are irritated, they can swell, burn, itch, cause pain, and bleed.  Most flares will calm down gradually own within a few weeks.  However, once hemorrhoids are created, they are difficult to get rid of completely and tend to flare more easily than the first flare.   Fortunately, good habits and simple medical treatment usually control hemorrhoids well, and surgery is needed only in severe cases. Types of Hemorrhoids:  Internal hemorrhoids usually don't initially hurt or itch; they are deep inside the rectum and usually have no sensation. If they begin to push out (prolapse), pain and burning can occur.  However, internal hemorrhoids can bleed.  Anal bleeding should not be ignored since bleeding could come from a dangerous source like colorectal cancer, so persistent rectal bleeding should be investigated by a doctor, sometimes with a colonoscopy.  External hemorrhoids cause most of the symptoms - pain, burning, and itching. Nonirritated hemorrhoids can look like small skin tags coming out of the anus.   Thrombosed hemorrhoids can form when a hemorrhoid blood vessel bursts and causes the hemorrhoid to suddenly swell.  A purple blood clot can form in it and become an excruciatingly painful lump at the anus. Because of these unpleasant symptoms, immediate incision and  drainage by a surgeon at an office visit can provide much relief of the pain.    PREVENTION Avoiding the most frequent causes listed below will prevent most cases of hemorrhoids: Constipation Hard stools Diarrhea  Constant sitting  Straining with bowel movements Sitting on the toilet for a long time  Severe coughing  episodes Pregnancy / Childbirth  Heavy Lifting  Sometimes avoiding the above triggers is difficult:  How can you avoid sitting all day if you have a seated job? Also, we try to avoid coughing and diarrhea, but sometimes it's beyond your control.  Still, there are some practical hints to help: Keep the anal and genital area clean.  Moistened tissues such as flushable wet wipes are less irritating than toilet paper.  Using irrigating showers or bottle irrigation washing gently cleans this sensitive area.   Avoid dry toilet paper when cleaning after bowel movements.  . Keep the anal and genital area dry.  Lightly pat the rectal area dry.  Avoid rubbing.  Talcum or baby powders can help GET YOUR STOOLS SOFT.   This is the most important way to prevent irritated hemorrhoids.  Hard stools are like sandpaper to the anorectal canal and will cause more problems.  The goal: ONE SOFT BOWEL MOVEMENT A DAY!  BMs from every other day to 3 times a day is a tolerable range Treat coughing, diarrhea and constipation early since irritated hemorrhoids may soon follow.  If your main job activity is seated, always stand or walk during your breaks. Make it a point to stand and walk at least 5 minutes every hour   and try to shift frequently in your chair to avoid direct rectal pressure.  Always exhale as you strain or lift. Don't hold your breath.  Do not delay or try to prevent a bowel movement when the urge is present. Exercise regularly (walking or jogging 60 minutes a day) to stimulate the bowels to move. No reading or other activity while on the toilet. If bowel movements take longer than 5 minutes,  you are too constipated. AVOID CONSTIPATION Drink plenty of liquids (1 1/2 to 2 quarts of water and other fluids a day unless fluid restricted for another medical condition). Liquids that contain caffeine (coffee a, tea, soft drinks) can be dehydrating and should be avoided until constipation is controlled. Consider minimizing milk, as dairy products may be constipating. Eat plenty of fiber (30g a day ideal, more if needed).  Fiber is the undigested part of plant food that passes into the colon, acting as "natures broom" to encourage bowel motility and movement.  Fiber can absorb and hold large amounts of water. This results in a larger, bulkier stool, which is soft and easier to pass.  Eating foods high in fiber - 12 servings - such as  Vegetables: Root (potatoes, carrots, turnips), Leafy green (lettuce, salad greens, celery, spinach), High residue (cabbage, broccoli, etc.) Fruit: Fresh, Dried (prunes, apricots, cherries), Stewed (applesauce)  Whole grain breads, pasta, whole wheat Bran cereals, muffins, etc. Consider adding supplemental bulking fiber which retains large volumes of water: Psyllium ground seeds (native plant from [place])--available as Metamucil, Konsyl, Effersyllium, Per Diem Fiber, or the less expensive generic forms.  Citrucel  (methylcellulose wood fiber) . FiberCon (Polycarbophil) Polyethylene Glycol - and "artificial" fiber commonly called Miralax or Glycolax.  It is helpful for people with gassy or bloated feelings with regular fiber Flax Seed - a less gassy natural fiber  Laxatives can be useful for a short period if constipation is severe Osmotics (Milk of Magnesia, Fleets Phospho-Soda, Magnesium Citrate)  Stimulants (Senokot,   Castor Oil,  Dulcolax, Ex-Lax)    Laxatives are not a good long-term solution as it can stress the bowels and cause too much mineral loss and dehydration.   Avoid taking laxatives for more than 7 days in a row.  AVOID DIARRHEA Switch to  liquids and simpler foods for a few days to avoid stressing your intestines further. Avoid dairy products (especially milk & ice cream) for a short time.  The intestines often can lose the ability to digest lactose when stressed. Avoid foods that cause gassiness or bloating.  Typical foods include beans and other legumes, cabbage, broccoli, and dairy foods.  Every person has some sensitivity to other foods, so listen to your body and avoid those foods that trigger problems for you. Adding fiber (Citrucel, Metamucil, FiberCon, Flax seed, Miralax) gradually can help thicken stools by absorbing excess fluid and retrain the intestines to act more normally.  Slowly increase the dose over a few weeks.  Too much fiber too soon can backfire and cause cramping & bloating. Probiotics (such as active yogurt, Align, etc) may help repopulate the intestines and colon with normal bacteria and calm down a sensitive digestive tract.  Most studies show it to be of mild help, though, and such products can be costly. Medicines: Bismuth subsalicylate (ex. Kayopectate, Pepto Bismol) every 30 minutes for up to 6 doses can help control diarrhea.  Avoid if pregnant. Loperamide (Immodium) can slow down diarrhea.  Start with two tablets (4mg total) first and then try one tablet   every 6 hours.  Avoid if you are having fevers or severe pain.  If you are not better or start feeling worse, stop all medicines and call your doctor for advice Call your doctor if you are getting worse or not better.  Sometimes further testing (cultures, endoscopy, X-ray studies, bloodwork, etc) may be needed to help diagnose and treat the cause of the diarrhea. TREATMENT OF HEMORRHOID FLARE If these preventive measures fail, you must take action right away! Hemorrhoids are one condition that can be mild in the morning and become intolerable by nightfall. Most hemorrhoidal flares take several weeks to calm down.  These suggestions can help: Warm soaks.   This helps more than any topical medication.  Use up to 8 times a day.  Usually sitz baths or sitting in a warm bathtub helps.  Sitting on moist warm towels are helpful.  Switching to ice packs/cool compresses can be helpful  Use a Sitz Bath 4-8 times a day for relief A sitz bath is a warm water bath taken in the sitting position that covers only the hips and buttocks. It may be used for either healing or hygiene purposes. Sitz baths are also used to relieve pain, itching, or muscle spasms. The water may contain medicine. Moist heat will help you heal and relax.  HOME CARE INSTRUCTIONS  Take 3 to 4 sitz baths a day. 1. Fill the bathtub half full with warm water. 2. Sit in the water and open the drain a little. 3. Turn on the warm water to keep the tub half full. Keep the water running constantly. 4. Soak in the water for 15 to 20 minutes. 5. After the sitz bath, pat the affected area dry first. SEEK MEDICAL CARE IF:  You get worse instead of better. Stop the sitz baths if you get worse.  Normalize your bowels.  Extremes of diarrhea or constipation will make hemorrhoids worse.  One soft bowel movement a day is the goal.  Fiber can help get your bowels regular Wet wipes instead of toilet paper Pain control with a NSAID such as ibuprofen (Advil) or naproxen (Aleve) or acetaminophen (Tylenol) around the clock.  Narcotics are constipating and should be minimized if possible Topical creams contain steroids (bydrocortisone) or local anesthetic (xylocaine) can help make pain and itching more tolerable.   EVALUATION If hemorrhoids are still causing problems, you could benefit by an evaluation by a surgeon.  The surgeon will obtain a history and examine you.  If hemorrhoids are diagnosed, some therapies can be offered in the office, usually with an anoscope into the less sensitive area of the rectum: -injection of hemorrhoids (sclerotherapy) can scar the blood vessels of the swollen/enlarged hemorrhoids to  help shrink them down to a more normal size -rubber banding of the enlarged hemorrhoids to help shrink them down to a more normal size -drainage of the blood clot causing a thrombosed hemorrhoid,  to relieve the severe pain   While 90% of the time such problems from hemorrhoids can be managed without preceding to surgery, sometimes the hemorrhoids require a operation to control the problem (uncontrolled bleeding, prolapse, pain, etc.).   This involves being placed under general anesthesia where the surgeon can confirm the diagnosis and remove, suture, or staple the hemorrhoid(s).  Your surgeon can help you treat the problem appropriately.   GETTING TO GOOD BOWEL HEALTH. Irregular bowel habits such as constipation and diarrhea can lead to many problems over time.  Having one soft bowel movement a   day is the most important way to prevent further problems.  The anorectal canal is designed to handle stretching and feces to safely manage our ability to get rid of solid waste (feces, poop, stool) out of our body.  BUT, hard constipated stools can act like ripping concrete bricks and diarrhea can be a burning fire to this very sensitive area of our body, causing inflamed hemorrhoids, anal fissures, increasing risk is perirectal abscesses, abdominal pain/bloating, an making irritable bowel worse.     The goal: ONE SOFT BOWEL MOVEMENT A DAY!  To have soft, regular bowel movements:    Drink at least 8 tall glasses of water a day.     Take plenty of fiber.  Fiber is the undigested part of plant food that passes into the colon, acting s "natures broom" to encourage bowel motility and movement.  Fiber can absorb and hold large amounts of water. This results in a larger, bulkier stool, which is soft and easier to pass. Work gradually over several weeks up to 6 servings a day of fiber (25g a day even more if needed) in the form of: o Vegetables -- Root (potatoes, carrots, turnips), leafy green (lettuce, salad greens,  celery, spinach), or cooked high residue (cabbage, broccoli, etc) o Fruit -- Fresh (unpeeled skin & pulp), Dried (prunes, apricots, cherries, etc ),  or stewed ( applesauce)  o Whole grain breads, pasta, etc (whole wheat)  o Bran cereals    Bulking Agents -- This type of water-retaining fiber generally is easily obtained each day by one of the following:  o Psyllium bran -- The psyllium plant is remarkable because its ground seeds can retain so much water. This product is available as Metamucil, Konsyl, Effersyllium, Per Diem Fiber, or the less expensive generic preparation in drug and health food stores. Although labeled a laxative, it really is not a laxative.  o Methylcellulose -- This is another fiber derived from wood which also retains water. It is available as Citrucel. o Polyethylene Glycol - and "artificial" fiber commonly called Miralax or Glycolax.  It is helpful for people with gassy or bloated feelings with regular fiber o Flax Seed - a less gassy fiber than psyllium   No reading or other relaxing activity while on the toilet. If bowel movements take longer than 5 minutes, you are too constipated   AVOID CONSTIPATION.  High fiber and water intake usually takes care of this.  Sometimes a laxative is needed to stimulate more frequent bowel movements, but    Laxatives are not a good long-term solution as it can wear the colon out. o Osmotics (Milk of Magnesia, Fleets phosphosoda, Magnesium citrate, MiraLax, GoLytely) are safer than  o Stimulants (Senokot, Castor Oil, Dulcolax, Ex Lax)    o Do not take laxatives for more than 7days in a row.    IF SEVERELY CONSTIPATED, try a Bowel Retraining Program: o Do not use laxatives.  o Eat a diet high in roughage, such as bran cereals and leafy vegetables.  o Drink six (6) ounces of prune or apricot juice each morning.  o Eat two (2) large servings of stewed fruit each day.  o Take one (1) heaping tablespoon of a psyllium-based bulking agent  twice a day. Use sugar-free sweetener when possible to avoid excessive calories.  o Eat a normal breakfast.  o Set aside 15 minutes after breakfast to sit on the toilet, but do not strain to have a bowel movement.  o If you do not   have a bowel movement by the third day, use an enema and repeat the above steps.    Controlling diarrhea o Switch to liquids and simpler foods for a few days to avoid stressing your intestines further. o Avoid dairy products (especially milk & ice cream) for a short time.  The intestines often can lose the ability to digest lactose when stressed. o Avoid foods that cause gassiness or bloating.  Typical foods include beans and other legumes, cabbage, broccoli, and dairy foods.  Every person has some sensitivity to other foods, so listen to our body and avoid those foods that trigger problems for you. o Adding fiber (Citrucel, Metamucil, psyllium, Miralax) gradually can help thicken stools by absorbing excess fluid and retrain the intestines to act more normally.  Slowly increase the dose over a few weeks.  Too much fiber too soon can backfire and cause cramping & bloating. o Probiotics (such as active yogurt, Align, etc) may help repopulate the intestines and colon with normal bacteria and calm down a sensitive digestive tract.  Most studies show it to be of mild help, though, and such products can be costly. o Medicines:   Bismuth subsalicylate (ex. Kayopectate, Pepto Bismol) every 30 minutes for up to 6 doses can help control diarrhea.  Avoid if pregnant.   Loperamide (Immodium) can slow down diarrhea.  Start with two tablets (4mg total) first and then try one tablet every 6 hours.  Avoid if you are having fevers or severe pain.  If you are not better or start feeling worse, stop all medicines and call your doctor for advice o Call your doctor if you are getting worse or not better.  Sometimes further testing (cultures, endoscopy, X-ray studies, bloodwork, etc) may be needed  to help diagnose and treat the cause of the diarrhea. o   

## 2014-02-27 ENCOUNTER — Emergency Department (HOSPITAL_BASED_OUTPATIENT_CLINIC_OR_DEPARTMENT_OTHER): Payer: Managed Care, Other (non HMO)

## 2014-02-27 ENCOUNTER — Emergency Department (HOSPITAL_BASED_OUTPATIENT_CLINIC_OR_DEPARTMENT_OTHER)
Admission: EM | Admit: 2014-02-27 | Discharge: 2014-02-28 | Disposition: A | Discharge status: Home / Self Care | Payer: Managed Care, Other (non HMO) | Attending: Emergency Medicine | Admitting: Emergency Medicine

## 2014-02-27 ENCOUNTER — Encounter (HOSPITAL_BASED_OUTPATIENT_CLINIC_OR_DEPARTMENT_OTHER): Payer: Self-pay | Admitting: Emergency Medicine

## 2014-02-27 DIAGNOSIS — M5416 Radiculopathy, lumbar region: Secondary | ICD-10-CM

## 2014-02-27 DIAGNOSIS — Z8659 Personal history of other mental and behavioral disorders: Secondary | ICD-10-CM | POA: Insufficient documentation

## 2014-02-27 DIAGNOSIS — Z8719 Personal history of other diseases of the digestive system: Secondary | ICD-10-CM | POA: Insufficient documentation

## 2014-02-27 DIAGNOSIS — D509 Iron deficiency anemia, unspecified: Secondary | ICD-10-CM | POA: Insufficient documentation

## 2014-02-27 DIAGNOSIS — Z91040 Latex allergy status: Secondary | ICD-10-CM | POA: Insufficient documentation

## 2014-02-27 DIAGNOSIS — G40909 Epilepsy, unspecified, not intractable, without status epilepticus: Secondary | ICD-10-CM | POA: Insufficient documentation

## 2014-02-27 DIAGNOSIS — Z79899 Other long term (current) drug therapy: Secondary | ICD-10-CM | POA: Insufficient documentation

## 2014-02-27 DIAGNOSIS — IMO0002 Reserved for concepts with insufficient information to code with codable children: Secondary | ICD-10-CM | POA: Insufficient documentation

## 2014-02-27 DIAGNOSIS — E119 Type 2 diabetes mellitus without complications: Secondary | ICD-10-CM | POA: Insufficient documentation

## 2014-02-27 NOTE — ED Notes (Signed)
Patient transported to Ultrasound 

## 2014-02-27 NOTE — ED Provider Notes (Addendum)
CSN: [id number]     Arrival date & time 02/27/14  2136 History   None    This chart was scribed for Wynetta Fines, MD by Forrestine Him, ED Scribe. This patient was seen in room MH08/MH08 and the patient's care was started 11:55 PM.   Chief Complaint  Patient presents with  . Leg Pain   HPI  HPI Comments: Abigail Hall is a 33 y.o. Female with a PMHx of anemia, DM who presents to the Emergency Department complaining of constant, moderate R lateral thigh pain x 2 days that has worsened. She currently rates this pain 9/10. No known injury or trauma. She also reports pain to the R lower back that radiates down the R lateral thigh. Pain is exacerbated with ambulation, weight bearing, and pressure. She has taken ibuprofen without relief. No fever, chills, loss of sensation, or paresthesia. She reports subjective swelling of right medial thigh.  Past Medical History  Diagnosis Date  . Hemorrhoids   . Rectal bleeding   . Constipation   . Nausea & vomiting     resolved after delivery - C/S, extreme  . Rectal pain   . Weakness     resolved after C/S delivery  . Generalized headaches   . Anemia   . Thalassemia   . Diabetes mellitus     gestational DM 2013 pregnancy-resolved  . Anemia, iron deficiency 04/10/2012  . Depression 04/26/13    history - no current problems  . Seasonal allergies   . Seizures     last seizure was  08/2012  . History of blood transfusion 02/2013    [facility]  2 units transfused    Past Surgical History  Procedure Laterality Date  . Tongue surgery    . Carpal tunnel release  01/2009    right  . Wisdom tooth extraction    . Cesarean section  12/31/2011    Procedure: CESAREAN SECTION;  Surgeon: Jolayne Haines, MD;  Location: [facility];  Service: Gynecology;  Laterality: N/A;  . Laparoscopic tubal ligation  03/27/2012    Procedure: LAPAROSCOPIC TUBAL LIGATION;  Surgeon: Jolayne Haines, MD;  Location: [facility];  Service: Gynecology;  Laterality: Bilateral;  fallopian  tubes caurtery  . Labioplasty  03/27/2012    Procedure: LABIAPLASTY;  Surgeon: Jolayne Haines, MD;  Location: [facility];  Service: Gynecology;  Laterality: N/A;  labia  . Tubal ligation    . Laparoscopy      removal of cyst  . Dilation and curettage of uterus      endometrial ablation  . Abdominal hysterectomy    . Robotic assisted total hysterectomy N/A 05/02/2013    Procedure: ROBOTIC ASSISTED TOTAL HYSTERECTOMY;  Surgeon: Marvene Staff, MD;  Location: [facility];  Service: Gynecology;  Laterality: N/A;  . Unilateral salpingectomy Right 05/02/2013    Procedure: UNILATERAL SALPINGECTOMY;  Surgeon: Marvene Staff, MD;  Location: [facility];  Service: Gynecology;  Laterality: Right;  . Transanal hemorrhoidal dearterialization N/A 05/31/2013    Procedure: [place] HEMORRHOIDAL LIGATION/PEXY;  Surgeon: Adin Hector, MD;  Location: [facility];  Service: General;  Laterality: N/A;  . Evaluation under anesthesia with hemorrhoidectomy N/A 05/31/2013    Procedure: EXAM UNDER ANESTHESIA WITH EXTERNAL HEMORRHOIDECTOMY;  Surgeon: Adin Hector, MD;  Location: [facility];  Service: General;  Laterality: N/A;   Family History  Problem Relation Age of Onset  . Anesthesia problems Neg Hx   . Hypotension Neg Hx   . Malignant hyperthermia Neg  Hx   . Pseudochol deficiency Neg Hx   . Diabetes Mother   . Hyperlipidemia Father   . Diabetes Maternal Grandmother   . Cancer Maternal Grandfather   . Hyperlipidemia Paternal Grandfather    History  Substance Use Topics  . Smoking status: Never Smoker   . Smokeless tobacco: Never Used  . Alcohol Use: No   OB History   Grav Para Term Preterm Abortions TAB SAB Ect Mult Living   3 1 0 1 2 1 1 0 0 1      Review of Systems  All other systems reviewed and are negative.  Allergies  Latex and Keflex  Home Medications   Prior to Admission medications   Medication Sig Start Date End Date Taking? Authorizing Provider  divalproex (DEPAKOTE ER) 500 MG 24 hr tablet  Take 500 mg by mouth 2 (two) times daily.     Historical Provider, MD  fluticasone (FLONASE) 50 MCG/ACT nasal spray Place 1 spray into the nose daily as needed for allergies.  11/18/12   Historical Provider, MD  folic acid (FOLVITE) 1 MG tablet Take 2 tablets (2 mg total) by mouth daily. 03/16/13   [name], MD  hydrocortisone (ANUSOL-HC) 2.5 % rectal cream Place rectally 2 (two) times daily. 06/13/13   Adin Hector, MD  hydrocortisone 2.5 % cream  06/01/13   Historical Provider, MD  ibuprofen (ADVIL,MOTRIN) 800 MG tablet Take 1 tablet (800 mg total) by mouth every 6 (six) hours as needed for pain or fever (mild pain). 05/31/13   Adin Hector, MD  Lacosamide (VIMPAT) 100 MG TABS Take 100 mg by mouth 2 (two) times daily.    Historical Provider, MD  metFORMIN (GLUCOPHAGE) 500 MG tablet Take 1,000 mg by mouth every evening.     Historical Provider, MD  oxyCODONE (OXY IR/ROXICODONE) 5 MG immediate release tablet Take 1-2 tablets (5-10 mg total) by mouth every 6 (six) hours as needed for pain. 06/13/13   Adin Hector, MD  polyethylene glycol [facility] Floria Raveling) packet Take 17 g by mouth daily.    Historical Provider, MD  vitamin C (ASCORBIC ACID) 500 MG tablet Take 500 mg by mouth daily.    Historical Provider, MD  zolpidem (AMBIEN) 5 MG tablet Take 2 tablets (10 mg total) by mouth at bedtime as needed. As needed for insomnia 11/05/11   Jolayne Haines, MD   Triage Vitals: BP 135/79  Pulse 89  Temp(Src) 98.2 F (36.8 C) (Oral)  Resp 16  Ht 5\' 4"  (1.626 m)  Wt 253 lb (114.76 kg)  BMI 43.41 kg/m2  SpO2 98%  LMP 04/25/2013   Physical Exam  General: Well-developed, well-nourished female in no acute distress; appearance consistent with age of record HENT: normocephalic; atraumatic Eyes: pupils equal, round and reactive to light; extraocular muscles intact Neck: supple Heart: regular rate and rhythm; no murmurs, rubs or gallops Lungs: clear to auscultation bilaterally Abdomen: soft;  nondistended; nontender; no masses or hepatosplenomegaly; bowel sounds present Extremities: No deformity; full range of motion; pulses normal; no edema appeciated Back: tenderness to palpation over R lower back; negative straight leg raise on the L, positive straight leg raise on the R at 20 degrees Neurologic: Awake, alert and oriented; motor function intact in all extremities and symmetric; no facial droop Skin: Warm and dry Psychiatric: Normal mood and affect   ED Course  Procedures (including critical care time)  DIAGNOSTIC STUDIES: Oxygen Saturation is 98% on RA, Normal by my interpretation.  COORDINATION OF CARE: 11:58 PM- Will give pain medication to help manage symptoms. Discussed treatment plan with pt at bedside and pt agreed to plan.    MDM  Nursing notes and vitals signs, including pulse oximetry, reviewed.  Summary of this visit's results, reviewed by myself:   Imaging Studies: US Venous Img Lower Unilateral Right  02/28/2014   CLINICAL DATA:  Leg pain for 3 days.  EXAM: Right LOWER EXTREMITY VENOUS DOPPLER ULTRASOUND  TECHNIQUE: Gray-scale sonography with graded compression, as well as color Doppler and duplex ultrasound were performed to evaluate the lower extremity deep venous systems from the level of the common femoral vein and including the common femoral, femoral, profunda femoral, popliteal and calf veins including the posterior tibial, peroneal and gastrocnemius veins when visible. The superficial great saphenous vein was also interrogated. Spectral Doppler was utilized to evaluate flow at rest and with distal augmentation maneuvers in the common femoral, femoral and popliteal veins.  COMPARISON:  None.  FINDINGS: Common Femoral Vein: No evidence of thrombus.  Saphenofemoral Junction: No evidence of thrombus.  Profunda Femoral Vein: No evidence of thrombus.  Femoral Vein: No evidence of thrombus.  Popliteal Vein: No evidence of thrombus.  Calf Veins: No evidence of  thrombus.  Superficial Great Saphenous Vein: No evidence of thrombus.  Venous Reflux:  None.  Other Findings:  Preserved respiratory phasicity.  IMPRESSION: No evidence of deep venous thrombosis in the right lower extremity.   Electronically Signed   By: Jorje Guild M.D.   On: 02/28/2014 00:24    I personally performed the services described in this documentation, which was scribed in my presence. The recorded information has been reviewed and is accurate.    Wynetta Fines, MD 02/28/14 [id number]  Wynetta Fines, MD 02/28/14 [phone]

## 2014-02-27 NOTE — ED Notes (Signed)
Pt c/o right knee and thigh pain w/o injury x 2 d ays

## 2014-02-28 MED ORDER — HYDROCODONE-ACETAMINOPHEN 5-325 MG PO TABS: 1.0000 | ORAL_TABLET | Freq: Four times a day (QID) | ORAL | Status: DC | PRN

## 2014-02-28 MED ORDER — NAPROXEN 250 MG PO TABS
500.0000 mg | ORAL_TABLET | Freq: Once | ORAL | Status: AC
  Administered 2014-02-28: 500 mg via ORAL
  Filled 2014-02-28: qty 2

## 2014-03-01 ENCOUNTER — Ambulatory Visit (INDEPENDENT_AMBULATORY_CARE_PROVIDER_SITE_OTHER): Payer: Managed Care, Other (non HMO) | Admitting: Family Medicine

## 2014-03-01 ENCOUNTER — Encounter: Payer: Self-pay | Admitting: Family Medicine

## 2014-03-01 VITALS — BP 113/74 | HR 79 | Ht 64.0 in | Wt 250.0 lb

## 2014-03-01 DIAGNOSIS — M5430 Sciatica, unspecified side: Secondary | ICD-10-CM

## 2014-03-01 DIAGNOSIS — M5416 Radiculopathy, lumbar region: Secondary | ICD-10-CM

## 2014-03-01 DIAGNOSIS — IMO0002 Reserved for concepts with insufficient information to code with codable children: Secondary | ICD-10-CM

## 2014-03-01 DIAGNOSIS — M5441 Lumbago with sciatica, right side: Secondary | ICD-10-CM

## 2014-03-01 MED ORDER — PREDNISONE (PAK) 10 MG PO TABS: ORAL_TABLET | ORAL | Status: DC

## 2014-03-01 MED ORDER — CYCLOBENZAPRINE HCL 10 MG PO TABS: 10.0000 mg | ORAL_TABLET | Freq: Three times a day (TID) | ORAL | Status: DC | PRN

## 2014-03-01 MED ORDER — OXYCODONE-ACETAMINOPHEN 5-325 MG PO TABS: 1.0000 | ORAL_TABLET | ORAL | Status: DC | PRN

## 2014-03-01 NOTE — Patient Instructions (Signed)
You have lumbar radiculopathy (a pinched nerve in your low back). A prednisone dose pack is the best option for immediate relief and may be prescribed. Day after finishing prednisone start aleve 2 tabs twice a day with food for pain and inflammation. Percocet as needed for severe pain (no driving on this medicine). Flexeril as needed for muscle spasms (no driving on this medicine if it makes you sleepy). Stay as active as possible. Physical therapy has been shown to be helpful as well. Strengthening of low back muscles, abdominal musculature are key for long term pain relief. If not improving, will consider further imaging (MRI). Call me Monday if you're unable to go to work. Call me in 1 week at the latest to let me know how you're feeling.

## 2014-03-04 ENCOUNTER — Telehealth: Payer: Self-pay | Admitting: Family Medicine

## 2014-03-04 NOTE — Telephone Encounter (Signed)
Ok but if she's still feeling like this on Friday as we discussed I would consider an MRI - she should call then also to let us know how she's doing when she's completed the medicine.  Thanks!

## 2014-03-04 NOTE — Telephone Encounter (Signed)
Spoke with patient and she is having papers faxed to the office to be filled out. Told the patient that it would take several days to complete.

## 2014-03-05 ENCOUNTER — Encounter: Payer: Self-pay | Admitting: Family Medicine

## 2014-03-05 DIAGNOSIS — M545 Low back pain, unspecified: Secondary | ICD-10-CM | POA: Insufficient documentation

## 2014-03-05 NOTE — Progress Notes (Addendum)
Patient ID: Abigail Hall, female   DOB: 11-02-1980, 33 y.o.   MRN: [id number]  PCP: Marvene Staff, MD  Subjective:   HPI: Patient is a 33 y.o. female here for low back pain.  Patient reports she has had low back pain since Monday. Pain mostly radiates down into right thigh. Difficulty staying in one position. Tried pain medicine, ibuprofen. No left sided issues. Pain is constant. Slight numbness into right leg also. No bowel/bladder dysfunction. Pain level 9/10.  Past Medical History  Diagnosis Date  . Hemorrhoids   . Rectal bleeding   . Constipation   . Nausea & vomiting     resolved after delivery - C/S, extreme  . Rectal pain   . Weakness     resolved after C/S delivery  . Generalized headaches   . Anemia   . Thalassemia   . Diabetes mellitus     gestational DM 2013 pregnancy-resolved  . Anemia, iron deficiency 04/10/2012  . Depression 04/26/13    history - no current problems  . Seasonal allergies   . Seizures     last seizure was  08/2012  . History of blood transfusion 02/2013    [facility]  2 units transfused     Current Outpatient Prescriptions on File Prior to Visit  Medication Sig Dispense Refill  . divalproex (DEPAKOTE ER) 500 MG 24 hr tablet Take 500 mg by mouth 2 (two) times daily.       . fluticasone (FLONASE) 50 MCG/ACT nasal spray Place 1 spray into the nose daily as needed for allergies.       . folic acid (FOLVITE) 1 MG tablet Take 2 tablets (2 mg total) by mouth daily.  60 tablet  12  . Lacosamide (VIMPAT) 100 MG TABS Take 100 mg by mouth 2 (two) times daily.      . metFORMIN (GLUCOPHAGE) 500 MG tablet Take 1,000 mg by mouth every evening.       . vitamin C (ASCORBIC ACID) 500 MG tablet Take 500 mg by mouth daily.       No current facility-administered medications on file prior to visit.    Past Surgical History  Procedure Laterality Date  . Tongue surgery    . Carpal tunnel release  01/2009    right  . Wisdom tooth extraction     . Cesarean section  12/31/2011    Procedure: CESAREAN SECTION;  Surgeon: Jolayne Haines, MD;  Location: [place] [facility];  Service: Gynecology;  Laterality: N/A;  . Laparoscopic tubal ligation  03/27/2012    Procedure: LAPAROSCOPIC TUBAL LIGATION;  Surgeon: Jolayne Haines, MD;  Location: [place] [facility];  Service: Gynecology;  Laterality: Bilateral;  fallopian tubes caurtery  . Labioplasty  03/27/2012    Procedure: LABIAPLASTY;  Surgeon: Jolayne Haines, MD;  Location: [facility];  Service: Gynecology;  Laterality: N/A;  labia  . Tubal ligation    . Laparoscopy      removal of cyst  . Dilation and curettage of uterus      endometrial ablation  . Abdominal hysterectomy    . Robotic assisted total hysterectomy N/A 05/02/2013    Procedure: ROBOTIC ASSISTED TOTAL HYSTERECTOMY;  Surgeon: Marvene Staff, MD;  Location: [facility];  Service: Gynecology;  Laterality: N/A;  . Unilateral salpingectomy Right 05/02/2013    Procedure: UNILATERAL SALPINGECTOMY;  Surgeon: Marvene Staff, MD;  Location: [facility];  Service: Gynecology;  Laterality: Right;  . Transanal hemorrhoidal dearterialization N/A 05/31/2013    Procedure: [place]  LIGATION/PEXY;  Surgeon: Adin Hector, MD;  Location: [facility];  Service: General;  Laterality: N/A;  . Evaluation under anesthesia with hemorrhoidectomy N/A 05/31/2013    Procedure: EXAM UNDER ANESTHESIA WITH EXTERNAL HEMORRHOIDECTOMY;  Surgeon: Adin Hector, MD;  Location: [facility];  Service: General;  Laterality: N/A;    Allergies  Allergen Reactions  . Latex Itching, Swelling and Rash    Where touched.  . Keflex [Cephalexin] Hives    History   Social History  . Marital Status: Single    Spouse Name: N/A    Number of Children: N/A  . Years of Education: N/A   Occupational History  . Not on file.   Social History Main Topics  . Smoking status: Never Smoker   . Smokeless tobacco: Never Used  . Alcohol Use: No  . Drug Use: No  . Sexual Activity: Yes    Birth Control/  Protection: None   Other Topics Concern  . Not on file   Social History Narrative  . No narrative on file    Family History  Problem Relation Age of Onset  . Anesthesia problems Neg Hx   . Hypotension Neg Hx   . Malignant hyperthermia Neg Hx   . Pseudochol deficiency Neg Hx   . Diabetes Mother   . Hyperlipidemia Father   . Diabetes Maternal Grandmother   . Cancer Maternal Grandfather   . Hyperlipidemia Paternal Grandfather     BP 113/74  Pulse 79  Ht 5\' 4"  (1.626 m)  Wt 250 lb (113.399 kg)  BMI 42.89 kg/m2  LMP 04/25/2013  Review of Systems: See HPI above.    Objective:  Physical Exam:  Gen: NAD  Back: No gross deformity, scoliosis. TTP right lumbar paraspinal region.  No midline or bony TTP. FROM with pain on flexion. Strength LEs 5/5 all muscle groups.   2+ MSRs in patellar and achilles tendons, equal bilaterally. Negative SLRs. Sensation intact to light touch bilaterally. Negative logroll bilateral hips Negative fabers and piriformis stretches.    Assessment & Plan:  1. Low back pain - consistent with lumbar radiculopathy from disc bulge into right leg.  Start with prednisone dose pack then transition to aleve.  Percocet, flexeril as needed.  Call us in 1 week - add PT if improving.  Imaging if not improving.  Addendum:  MRI reviewed and discussed with patient.  She's still getting symptoms into right leg that are fairly severe.  On MRI she has a broad based disc bulge at L4-5 with mild foraminal narrowing bilaterally.  Discussed this is only mild, may not be the cause of her symptoms but no other findings and her history and exam are consistent with radiculopathy. Will go ahead with trial of ESI at L4-5 on right side.  F/u in 2 weeks.

## 2014-03-05 NOTE — Assessment & Plan Note (Signed)
consistent with lumbar radiculopathy from disc bulge into right leg.  Start with prednisone dose pack then transition to aleve.  Percocet, flexeril as needed.  Call us in 1 week - add PT if improving.  Imaging if not improving.

## 2014-03-11 ENCOUNTER — Other Ambulatory Visit: Payer: Self-pay | Admitting: *Deleted

## 2014-03-11 DIAGNOSIS — M5441 Lumbago with sciatica, right side: Secondary | ICD-10-CM

## 2014-03-11 NOTE — Telephone Encounter (Signed)
We have received the paperwork but without any information.  What is this paperwork for?  We didn't write her a letter with restrictions and she hasn't been written for physical therapy.  Has she been out of work?    Further, is she improving?  If so we should add physical therapy.  If not, we should go ahead with an MRI.

## 2014-03-11 NOTE — Telephone Encounter (Signed)
Spoke with patient and she stated that she is still having pain in her back and right leg. Would like to go ahead with imaging. Not improving.

## 2014-03-11 NOTE — Telephone Encounter (Signed)
Ok to go ahead with x-rays of lumbar spine (2-3 views lumbar spine) then MRI.  Will not fill out paperwork until we have these results and discuss next steps as they would likely change following the imaging with her not improving.

## 2014-03-12 ENCOUNTER — Ambulatory Visit (HOSPITAL_BASED_OUTPATIENT_CLINIC_OR_DEPARTMENT_OTHER)
Admission: RE | Admit: 2014-03-12 | Discharge: 2014-03-12 | Disposition: A | Discharge status: Home / Self Care | Payer: Managed Care, Other (non HMO) | Source: Ambulatory Visit | Attending: Family Medicine | Admitting: Family Medicine

## 2014-03-12 DIAGNOSIS — M5430 Sciatica, unspecified side: Secondary | ICD-10-CM | POA: Insufficient documentation

## 2014-03-12 DIAGNOSIS — M5441 Lumbago with sciatica, right side: Secondary | ICD-10-CM

## 2014-03-13 ENCOUNTER — Other Ambulatory Visit: Payer: Self-pay | Admitting: *Deleted

## 2014-03-13 DIAGNOSIS — M5441 Lumbago with sciatica, right side: Secondary | ICD-10-CM

## 2014-03-16 ENCOUNTER — Ambulatory Visit (HOSPITAL_BASED_OUTPATIENT_CLINIC_OR_DEPARTMENT_OTHER)
Admission: RE | Admit: 2014-03-16 | Discharge: 2014-03-16 | Disposition: A | Discharge status: Home / Self Care | Payer: Managed Care, Other (non HMO) | Source: Ambulatory Visit | Attending: Family Medicine | Admitting: Family Medicine

## 2014-03-16 DIAGNOSIS — M5441 Lumbago with sciatica, right side: Secondary | ICD-10-CM

## 2014-03-16 DIAGNOSIS — M5126 Other intervertebral disc displacement, lumbar region: Secondary | ICD-10-CM | POA: Insufficient documentation

## 2014-03-20 ENCOUNTER — Other Ambulatory Visit: Payer: Self-pay | Admitting: Family Medicine

## 2014-03-20 ENCOUNTER — Encounter: Payer: Self-pay | Admitting: *Deleted

## 2014-03-20 DIAGNOSIS — M5416 Radiculopathy, lumbar region: Secondary | ICD-10-CM

## 2014-03-25 ENCOUNTER — Ambulatory Visit
Admission: RE | Admit: 2014-03-25 | Discharge: 2014-03-25 | Disposition: A | Discharge status: Home / Self Care | Payer: Managed Care, Other (non HMO) | Source: Ambulatory Visit | Attending: Family Medicine | Admitting: Family Medicine

## 2014-03-25 VITALS — BP 128/67 | HR 91

## 2014-03-25 DIAGNOSIS — M5416 Radiculopathy, lumbar region: Secondary | ICD-10-CM

## 2014-03-25 MED ORDER — METHYLPREDNISOLONE ACETATE 40 MG/ML INJ SUSP (RADIOLOG
120.0000 mg | Freq: Once | INTRAMUSCULAR | Status: AC
  Administered 2014-03-25: 120 mg via EPIDURAL

## 2014-03-25 MED ORDER — IOHEXOL 180 MG/ML  SOLN
1.0000 mL | Freq: Once | INTRAMUSCULAR | Status: AC | PRN
  Administered 2014-03-25: 1 mL via EPIDURAL

## 2014-03-25 NOTE — Discharge Instructions (Signed)

## 2014-04-01 ENCOUNTER — Ambulatory Visit: Payer: Managed Care, Other (non HMO) | Admitting: Family Medicine

## 2014-04-01 ENCOUNTER — Encounter: Payer: Self-pay | Admitting: Family Medicine

## 2014-04-01 ENCOUNTER — Other Ambulatory Visit: Payer: Self-pay | Admitting: Family Medicine

## 2014-04-01 ENCOUNTER — Ambulatory Visit (INDEPENDENT_AMBULATORY_CARE_PROVIDER_SITE_OTHER): Payer: Managed Care, Other (non HMO) | Admitting: Family Medicine

## 2014-04-01 VITALS — BP 113/77 | HR 97 | Ht 64.0 in | Wt 250.0 lb

## 2014-04-01 DIAGNOSIS — M5441 Lumbago with sciatica, right side: Secondary | ICD-10-CM

## 2014-04-01 DIAGNOSIS — M5430 Sciatica, unspecified side: Secondary | ICD-10-CM

## 2014-04-01 DIAGNOSIS — M545 Low back pain, unspecified: Secondary | ICD-10-CM

## 2014-04-01 DIAGNOSIS — G8929 Other chronic pain: Secondary | ICD-10-CM

## 2014-04-01 NOTE — Patient Instructions (Signed)
We will go ahead with setting up a second injection for your back. In about 2 weeks start physical therapy as well. Out of work for 1 more month. Follow up with me at that time.

## 2014-04-02 ENCOUNTER — Encounter: Payer: Self-pay | Admitting: Family Medicine

## 2014-04-02 NOTE — Addendum Note (Signed)
Addended by: Sherrie George F on: 04/02/2014 02:45 PM   Modules accepted: Orders

## 2014-04-02 NOTE — Assessment & Plan Note (Signed)
consistent with lumbar radiculopathy from disc bulge into right leg, likely from the L4-5 bulge on MRI.  Discussed her options.  Will try second ESI and then start physical therapy.  Percocet, flexeril as needed.  Follow up in about 1 month to 6 weeks.  Note provided for work.

## 2014-04-02 NOTE — Progress Notes (Signed)
Patient ID: Abigail Hall, female   DOB: [birth year], 33 y.o.   MRN: [id number]  PCP: Marvene Staff, MD  Subjective:   HPI: Patient is a 33 y.o. female here for low back pain.  7/17: Patient reports she has had low back pain since Monday. Pain mostly radiates down into right thigh. Difficulty staying in one position. Tried pain medicine, ibuprofen. No left sided issues. Pain is constant. Slight numbness into right leg also. No bowel/bladder dysfunction. Pain level 9/10.  8/17: Patient reports mild improvement since last visit. Right leg feels better. Able to walk better also. Hard to stay in same position for a long time. Using muscle relaxant, aleve, pain medication as needed. No bowel/bladder dysfunction.  Past Medical History  Diagnosis Date  . Hemorrhoids   . Rectal bleeding   . Constipation   . Nausea & vomiting     resolved after delivery - C/S, extreme  . Rectal pain   . Weakness     resolved after C/S delivery  . Generalized headaches   . Anemia   . Thalassemia   . Diabetes mellitus     gestational DM 2013 pregnancy-resolved  . Anemia, iron deficiency 04/10/2012  . Depression 04/26/13    history - no current problems  . Seasonal allergies   . Seizures     last seizure was  08/2012  . History of blood transfusion 02/2013    [facility]  2 units transfused     Current Outpatient Prescriptions on File Prior to Visit  Medication Sig Dispense Refill  . cyclobenzaprine (FLEXERIL) 10 MG tablet Take 1 tablet (10 mg total) by mouth every 8 (eight) hours as needed for muscle spasms.  60 tablet  1  . divalproex (DEPAKOTE ER) 500 MG 24 hr tablet Take 500 mg by mouth 2 (two) times daily.       . fluticasone (FLONASE) 50 MCG/ACT nasal spray Place 1 spray into the nose daily as needed for allergies.       . folic acid (FOLVITE) 1 MG tablet Take 2 tablets (2 mg total) by mouth daily.  60 tablet  12  . Lacosamide (VIMPAT) 100 MG TABS Take 100 mg by mouth 2  (two) times daily.      . metFORMIN (GLUCOPHAGE) 500 MG tablet Take 1,000 mg by mouth every evening.       Marland Kitchen oxyCODONE-acetaminophen (PERCOCET/ROXICET) 5-325 MG per tablet Take 1 tablet by mouth every 4 (four) hours as needed for severe pain.  60 tablet  0  . vitamin C (ASCORBIC ACID) 500 MG tablet Take 500 mg by mouth daily.       No current facility-administered medications on file prior to visit.    Past Surgical History  Procedure Laterality Date  . Tongue surgery    . Carpal tunnel release  01/2009    right  . Wisdom tooth extraction    . Cesarean section  12/31/2011    Procedure: CESAREAN SECTION;  Surgeon: Jolayne Haines, MD;  Location: [facility];  Service: Gynecology;  Laterality: N/A;  . Laparoscopic tubal ligation  03/27/2012    Procedure: LAPAROSCOPIC TUBAL LIGATION;  Surgeon: Jolayne Haines, MD;  Location: [place] [facility];  Service: Gynecology;  Laterality: Bilateral;  fallopian tubes caurtery  . Labioplasty  03/27/2012    Procedure: LABIAPLASTY;  Surgeon: Jolayne Haines, MD;  Location: [place] [facility];  Service: Gynecology;  Laterality: N/A;  labia  . Tubal ligation    . Laparoscopy  removal of cyst  . Dilation and curettage of uterus      endometrial ablation  . Abdominal hysterectomy    . Robotic assisted total hysterectomy N/A 05/02/2013    Procedure: ROBOTIC ASSISTED TOTAL HYSTERECTOMY;  Surgeon: Marvene Staff, MD;  Location: [place] [facility];  Service: Gynecology;  Laterality: N/A;  . Unilateral salpingectomy Right 05/02/2013    Procedure: UNILATERAL SALPINGECTOMY;  Surgeon: Marvene Staff, MD;  Location: [facility];  Service: Gynecology;  Laterality: Right;  . Transanal hemorrhoidal dearterialization N/A 05/31/2013    Procedure: [place] HEMORRHOIDAL LIGATION/PEXY;  Surgeon: Adin Hector, MD;  Location: [facility];  Service: General;  Laterality: N/A;  . Evaluation under anesthesia with hemorrhoidectomy N/A 05/31/2013    Procedure: EXAM UNDER ANESTHESIA WITH EXTERNAL HEMORRHOIDECTOMY;  Surgeon:  Adin Hector, MD;  Location: [facility];  Service: General;  Laterality: N/A;    Allergies  Allergen Reactions  . Latex Itching, Swelling and Rash    Where touched.  . Keflex [Cephalexin] Hives    History   Social History  . Marital Status: Single    Spouse Name: N/A    Number of Children: N/A  . Years of Education: N/A   Occupational History  . Not on file.   Social History Main Topics  . Smoking status: Never Smoker   . Smokeless tobacco: Never Used  . Alcohol Use: No  . Drug Use: No  . Sexual Activity: Yes    Birth Control/ Protection: None   Other Topics Concern  . Not on file   Social History Narrative  . No narrative on file    Family History  Problem Relation Age of Onset  . Anesthesia problems Neg Hx   . Hypotension Neg Hx   . Malignant hyperthermia Neg Hx   . Pseudochol deficiency Neg Hx   . Diabetes Mother   . Hyperlipidemia Father   . Diabetes Maternal Grandmother   . Cancer Maternal Grandfather   . Hyperlipidemia Paternal Grandfather     BP 113/77  Pulse 97  Ht 5\' 4"  (1.626 m)  Wt 250 lb (113.399 kg)  BMI 42.89 kg/m2  LMP 04/25/2013  Review of Systems: See HPI above.    Objective:  Physical Exam:  Gen: NAD  Back: No gross deformity, scoliosis. TTP right lumbar paraspinal region.  No midline or bony TTP. FROM with pain on flexion. Strength LEs 5/5 all muscle groups.   2+ MSRs in patellar and achilles tendons, equal bilaterally. Negative SLRs. Sensation intact to light touch bilaterally. Negative logroll bilateral hips Negative fabers and piriformis stretches.    Assessment & Plan:  1. Low back pain - consistent with lumbar radiculopathy from disc bulge into right leg, likely from the L4-5 bulge on MRI.  Discussed her options.  Will try second ESI and then start physical therapy.  Percocet, flexeril as needed.  Follow up in about 1 month to 6 weeks.  Note provided for work.

## 2014-04-08 ENCOUNTER — Ambulatory Visit
Admission: RE | Admit: 2014-04-08 | Discharge: 2014-04-08 | Disposition: A | Discharge status: Home / Self Care | Payer: Managed Care, Other (non HMO) | Source: Ambulatory Visit | Attending: Family Medicine | Admitting: Family Medicine

## 2014-04-08 DIAGNOSIS — M545 Low back pain, unspecified: Secondary | ICD-10-CM

## 2014-04-08 DIAGNOSIS — G8929 Other chronic pain: Secondary | ICD-10-CM

## 2014-04-08 MED ORDER — IOHEXOL 180 MG/ML  SOLN
1.0000 mL | Freq: Once | INTRAMUSCULAR | Status: AC | PRN
  Administered 2014-04-08: 1 mL via EPIDURAL

## 2014-04-08 MED ORDER — METHYLPREDNISOLONE ACETATE 40 MG/ML INJ SUSP (RADIOLOG
120.0000 mg | Freq: Once | INTRAMUSCULAR | Status: AC
  Administered 2014-04-08: 120 mg via EPIDURAL

## 2014-04-18 ENCOUNTER — Ambulatory Visit: Payer: Managed Care, Other (non HMO) | Attending: Family Medicine | Admitting: Rehabilitation

## 2014-04-18 DIAGNOSIS — IMO0001 Reserved for inherently not codable concepts without codable children: Secondary | ICD-10-CM | POA: Diagnosis not present

## 2014-04-18 DIAGNOSIS — R293 Abnormal posture: Secondary | ICD-10-CM | POA: Diagnosis not present

## 2014-04-18 DIAGNOSIS — M545 Low back pain, unspecified: Secondary | ICD-10-CM | POA: Insufficient documentation

## 2014-04-26 ENCOUNTER — Ambulatory Visit: Payer: Managed Care, Other (non HMO)

## 2014-04-26 ENCOUNTER — Telehealth: Payer: Self-pay | Admitting: Hematology & Oncology

## 2014-04-26 DIAGNOSIS — IMO0001 Reserved for inherently not codable concepts without codable children: Secondary | ICD-10-CM | POA: Diagnosis not present

## 2014-04-26 NOTE — Telephone Encounter (Signed)
Pt scheduled 10-9 appointment

## 2014-04-29 ENCOUNTER — Encounter: Payer: Self-pay | Admitting: Family Medicine

## 2014-04-29 ENCOUNTER — Ambulatory Visit: Payer: Managed Care, Other (non HMO) | Admitting: Physical Therapy

## 2014-04-29 ENCOUNTER — Ambulatory Visit (INDEPENDENT_AMBULATORY_CARE_PROVIDER_SITE_OTHER): Payer: Managed Care, Other (non HMO) | Admitting: Family Medicine

## 2014-04-29 VITALS — BP 118/79 | HR 118 | Ht 64.0 in | Wt 250.0 lb

## 2014-04-29 DIAGNOSIS — M5441 Lumbago with sciatica, right side: Secondary | ICD-10-CM

## 2014-04-29 DIAGNOSIS — M5430 Sciatica, unspecified side: Secondary | ICD-10-CM

## 2014-04-29 MED ORDER — METHOCARBAMOL 500 MG PO TABS: 500.0000 mg | ORAL_TABLET | Freq: Three times a day (TID) | ORAL | Status: DC | PRN

## 2014-04-29 MED ORDER — HYDROCODONE-ACETAMINOPHEN 5-325 MG PO TABS: 1.0000 | ORAL_TABLET | Freq: Four times a day (QID) | ORAL | Status: DC | PRN

## 2014-04-29 NOTE — Patient Instructions (Signed)
Continue with physical therapy and home exercises. Norco as needed for severe pain. Robaxin as needed for muscle spasms. Ibuprofen or aleve as needed for pain, inflammation. Out of work 2 more weeks then return to light duty (see note). Follow up with me in 5-6 weeks.

## 2014-04-30 ENCOUNTER — Ambulatory Visit: Payer: Managed Care, Other (non HMO) | Admitting: Physical Therapy

## 2014-04-30 DIAGNOSIS — IMO0001 Reserved for inherently not codable concepts without codable children: Secondary | ICD-10-CM | POA: Diagnosis not present

## 2014-05-01 ENCOUNTER — Encounter: Payer: Self-pay | Admitting: Family Medicine

## 2014-05-01 NOTE — Assessment & Plan Note (Signed)
consistent with lumbar radiculopathy from disc bulge into right leg, likely from the L4-5 bulge on MRI.  S/p 2 ESIs and only done a couple visits PT to date but improving.  Will return to light duty in 2 weeks to allow for more physical therapy before her return.  NSAIDs with norco and robaxin as needed.  Follow-up in 5-6 weeks.

## 2014-05-01 NOTE — Progress Notes (Signed)
Patient ID: Abigail Hall, female   DOB: 01/18/1981, 33 y.o.   MRN: [id number]  PCP: Marvene Staff, MD  Subjective:   HPI: Patient is a 33 y.o. female here for low back pain.  7/17: Patient reports she has had low back pain since Monday. Pain mostly radiates down into right thigh. Difficulty staying in one position. Tried pain medicine, ibuprofen. No left sided issues. Pain is constant. Slight numbness into right leg also. No bowel/bladder dysfunction. Pain level 9/10.  8/17: Patient reports mild improvement since last visit. Right leg feels better. Able to walk better also. Hard to stay in same position for a long time. Using muscle relaxant, aleve, pain medication as needed. No bowel/bladder dysfunction.  9/14: Patient reports she feels 40% improved from last visit. Doing physical therapy but only a couple visits to date. Some radiation into right leg still. Back is tight. Has done well with 2 ESIs also. No bowel/bladder dysfunction. Flexeril makes her feel sick - no longer taking.  Past Medical History  Diagnosis Date  . Hemorrhoids   . Rectal bleeding   . Constipation   . Nausea & vomiting     resolved after delivery - C/S, extreme  . Rectal pain   . Weakness     resolved after C/S delivery  . Generalized headaches   . Anemia   . Thalassemia   . Diabetes mellitus     gestational DM 2013 pregnancy-resolved  . Anemia, iron deficiency 04/10/2012  . Depression 04/26/13    history - no current problems  . Seasonal allergies   . Seizures     last seizure was  08/2012  . History of blood transfusion 02/2013    [facility]  2 units transfused     Current Outpatient Prescriptions on File Prior to Visit  Medication Sig Dispense Refill  . divalproex (DEPAKOTE ER) 500 MG 24 hr tablet Take 500 mg by mouth 2 (two) times daily.       . fluticasone (FLONASE) 50 MCG/ACT nasal spray Place 1 spray into the nose daily as needed for allergies.       . folic  acid (FOLVITE) 1 MG tablet Take 2 tablets (2 mg total) by mouth daily.  60 tablet  12  . Lacosamide (VIMPAT) 100 MG TABS Take 100 mg by mouth 2 (two) times daily.      . metFORMIN (GLUCOPHAGE) 500 MG tablet Take 1,000 mg by mouth every evening.       . vitamin C (ASCORBIC ACID) 500 MG tablet Take 500 mg by mouth daily.       No current facility-administered medications on file prior to visit.    Past Surgical History  Procedure Laterality Date  . Tongue surgery    . Carpal tunnel release  01/2009    right  . Wisdom tooth extraction    . Cesarean section  12/31/2011    Procedure: CESAREAN SECTION;  Surgeon: Jolayne Haines, MD;  Location: [facility];  Service: Gynecology;  Laterality: N/A;  . Laparoscopic tubal ligation  03/27/2012    Procedure: LAPAROSCOPIC TUBAL LIGATION;  Surgeon: Jolayne Haines, MD;  Location: [place];  Service: Gynecology;  Laterality: Bilateral;  fallopian tubes caurtery  . Labioplasty  03/27/2012    Procedure: LABIAPLASTY;  Surgeon: Jolayne Haines, MD;  Location: [facility];  Service: Gynecology;  Laterality: N/A;  labia  . Tubal ligation    . Laparoscopy      removal of cyst  . Dilation and  curettage of uterus      endometrial ablation  . Abdominal hysterectomy    . Robotic assisted total hysterectomy N/A 05/02/2013    Procedure: ROBOTIC ASSISTED TOTAL HYSTERECTOMY;  Surgeon: Marvene Staff, MD;  Location: [facility];  Service: Gynecology;  Laterality: N/A;  . Unilateral salpingectomy Right 05/02/2013    Procedure: UNILATERAL SALPINGECTOMY;  Surgeon: Marvene Staff, MD;  Location: [place];  Service: Gynecology;  Laterality: Right;  . Transanal hemorrhoidal dearterialization N/A 05/31/2013    Procedure: [place] HEMORRHOIDAL LIGATION/PEXY;  Surgeon: Adin Hector, MD;  Location: [facility];  Service: General;  Laterality: N/A;  . Evaluation under anesthesia with hemorrhoidectomy N/A 05/31/2013    Procedure: EXAM UNDER ANESTHESIA WITH EXTERNAL HEMORRHOIDECTOMY;  Surgeon: Adin Hector, MD;  Location: [facility];  Service: General;  Laterality: N/A;    Allergies  Allergen Reactions  . Latex Itching, Swelling and Rash    Where touched.  . Keflex [Cephalexin] Hives    History   Social History  . Marital Status: Single    Spouse Name: N/A    Number of Children: N/A  . Years of Education: N/A   Occupational History  . Not on file.   Social History Main Topics  . Smoking status: Never Smoker   . Smokeless tobacco: Never Used  . Alcohol Use: No  . Drug Use: No  . Sexual Activity: Yes    Birth Control/ Protection: None   Other Topics Concern  . Not on file   Social History Narrative  . No narrative on file    Family History  Problem Relation Age of Onset  . Anesthesia problems Neg Hx   . Hypotension Neg Hx   . Malignant hyperthermia Neg Hx   . Pseudochol deficiency Neg Hx   . Diabetes Mother   . Hyperlipidemia Father   . Diabetes Maternal Grandmother   . Cancer Maternal Grandfather   . Hyperlipidemia Paternal Grandfather     BP 118/79  Pulse 118  Ht 5\' 4"  (1.626 m)  Wt 250 lb (113.399 kg)  BMI 42.89 kg/m2  LMP 04/25/2013  Review of Systems: See HPI above.    Objective:  Physical Exam:  Gen: NAD  Back: No gross deformity, scoliosis. TTP mildly right lumbar paraspinal region.  No midline or bony TTP. FROM with pain on flexion. Strength LEs 5/5 all muscle groups.   2+ MSRs in patellar and achilles tendons, equal bilaterally. Negative SLRs. Sensation intact to light touch bilaterally. Negative logroll bilateral hips Negative fabers and piriformis stretches.    Assessment & Plan:  1. Low back pain - consistent with lumbar radiculopathy from disc bulge into right leg, likely from the L4-5 bulge on MRI.  S/p 2 ESIs and only done a couple visits PT to date but improving.  Will return to light duty in 2 weeks to allow for more physical therapy before her return.  NSAIDs with norco and robaxin as needed.  Follow-up in 5-6 weeks.

## 2014-05-02 ENCOUNTER — Ambulatory Visit: Payer: Managed Care, Other (non HMO) | Admitting: Rehabilitation

## 2014-05-02 DIAGNOSIS — IMO0001 Reserved for inherently not codable concepts without codable children: Secondary | ICD-10-CM | POA: Diagnosis not present

## 2014-05-06 ENCOUNTER — Ambulatory Visit: Payer: Managed Care, Other (non HMO) | Admitting: Physical Therapy

## 2014-05-06 DIAGNOSIS — IMO0001 Reserved for inherently not codable concepts without codable children: Secondary | ICD-10-CM | POA: Diagnosis not present

## 2014-05-08 ENCOUNTER — Telehealth: Payer: Self-pay | Admitting: *Deleted

## 2014-05-08 NOTE — Telephone Encounter (Signed)
She was already provided with 2 weeks out to do additional therapy and is returning to light duty.  She should follow directions as written on letter and return to light duty on 9/28.

## 2014-05-08 NOTE — Telephone Encounter (Signed)
Patient called and stated that she is supposed to go back to work on 05-13-14 (per letter). She would like to go back on 05-20-14. This would allow her to get several more PT sessions in without having to leave and/or miss work.

## 2014-05-09 ENCOUNTER — Ambulatory Visit: Payer: Managed Care, Other (non HMO) | Admitting: Physical Therapy

## 2014-05-13 ENCOUNTER — Ambulatory Visit: Payer: Managed Care, Other (non HMO) | Admitting: Rehabilitation

## 2014-05-13 DIAGNOSIS — IMO0001 Reserved for inherently not codable concepts without codable children: Secondary | ICD-10-CM | POA: Diagnosis not present

## 2014-05-15 ENCOUNTER — Ambulatory Visit: Payer: Managed Care, Other (non HMO) | Admitting: Rehabilitation

## 2014-05-15 DIAGNOSIS — IMO0001 Reserved for inherently not codable concepts without codable children: Secondary | ICD-10-CM | POA: Diagnosis not present

## 2014-05-16 ENCOUNTER — Ambulatory Visit: Payer: Managed Care, Other (non HMO) | Admitting: Physical Therapy

## 2014-05-20 ENCOUNTER — Ambulatory Visit: Payer: Managed Care, Other (non HMO) | Attending: Family Medicine | Admitting: Rehabilitation

## 2014-05-20 DIAGNOSIS — M545 Low back pain: Secondary | ICD-10-CM | POA: Diagnosis not present

## 2014-05-20 DIAGNOSIS — R293 Abnormal posture: Secondary | ICD-10-CM | POA: Diagnosis not present

## 2014-05-20 DIAGNOSIS — Z5189 Encounter for other specified aftercare: Secondary | ICD-10-CM | POA: Insufficient documentation

## 2014-05-23 ENCOUNTER — Ambulatory Visit: Payer: Managed Care, Other (non HMO) | Admitting: Physical Therapy

## 2014-05-23 ENCOUNTER — Other Ambulatory Visit: Payer: Self-pay | Admitting: *Deleted

## 2014-05-23 DIAGNOSIS — D509 Iron deficiency anemia, unspecified: Secondary | ICD-10-CM

## 2014-05-23 DIAGNOSIS — Z5189 Encounter for other specified aftercare: Secondary | ICD-10-CM | POA: Diagnosis not present

## 2014-05-24 ENCOUNTER — Encounter: Payer: Self-pay | Admitting: Hematology & Oncology

## 2014-05-24 ENCOUNTER — Other Ambulatory Visit (HOSPITAL_BASED_OUTPATIENT_CLINIC_OR_DEPARTMENT_OTHER): Payer: Managed Care, Other (non HMO) | Admitting: Lab

## 2014-05-24 ENCOUNTER — Telehealth: Payer: Self-pay | Admitting: Hematology & Oncology

## 2014-05-24 ENCOUNTER — Ambulatory Visit (HOSPITAL_BASED_OUTPATIENT_CLINIC_OR_DEPARTMENT_OTHER): Payer: Managed Care, Other (non HMO) | Admitting: Hematology & Oncology

## 2014-05-24 VITALS — BP 119/68 | HR 84 | Temp 98.8°F | Resp 14 | Ht 64.0 in | Wt 271.0 lb

## 2014-05-24 DIAGNOSIS — D509 Iron deficiency anemia, unspecified: Secondary | ICD-10-CM

## 2014-05-24 DIAGNOSIS — D563 Thalassemia minor: Secondary | ICD-10-CM

## 2014-05-24 LAB — CBC WITH DIFFERENTIAL (CANCER CENTER ONLY)
BASO#: 0 10*3/uL (ref 0.0–0.2)
BASO%: 0.4 % (ref 0.0–2.0)
EOS%: 0.9 % (ref 0.0–7.0)
Eosinophils Absolute: 0.1 10*3/uL (ref 0.0–0.5)
HCT: 28.6 % — ABNORMAL LOW (ref 34.8–46.6)
HGB: 9 g/dL — ABNORMAL LOW (ref 11.6–15.9)
LYMPH#: 2.7 10*3/uL (ref 0.9–3.3)
LYMPH%: 35.4 % (ref 14.0–48.0)
MCH: 18.9 pg — ABNORMAL LOW (ref 26.0–34.0)
MCHC: 31.5 g/dL — ABNORMAL LOW (ref 32.0–36.0)
MCV: 60 fL — ABNORMAL LOW (ref 81–101)
MONO#: 0.6 10*3/uL (ref 0.1–0.9)
MONO%: 7.9 % (ref 0.0–13.0)
NEUT#: 4.2 10*3/uL (ref 1.5–6.5)
NEUT%: 55.4 % (ref 39.6–80.0)
Platelets: 304 10*3/uL (ref 145–400)
RBC: 4.77 10*6/uL (ref 3.70–5.32)
RDW: 17.5 % — ABNORMAL HIGH (ref 11.1–15.7)
WBC: 7.6 10*3/uL (ref 3.9–10.0)

## 2014-05-24 LAB — COMPREHENSIVE METABOLIC PANEL
ALT: 16 U/L (ref 0–35)
AST: 13 U/L (ref 0–37)
Albumin: 4.1 g/dL (ref 3.5–5.2)
Alkaline Phosphatase: 62 U/L (ref 39–117)
BUN: 8 mg/dL (ref 6–23)
CO2: 22 mEq/L (ref 19–32)
Calcium: 9.4 mg/dL (ref 8.4–10.5)
Chloride: 101 mEq/L (ref 96–112)
Creatinine, Ser: 0.65 mg/dL (ref 0.50–1.10)
Glucose, Bld: 169 mg/dL — ABNORMAL HIGH (ref 70–99)
Potassium: 3.8 mEq/L (ref 3.5–5.3)
Sodium: 137 mEq/L (ref 135–145)
Total Bilirubin: 0.7 mg/dL (ref 0.2–1.2)
Total Protein: 6.7 g/dL (ref 6.0–8.3)

## 2014-05-24 LAB — TECHNOLOGIST REVIEW CHCC SATELLITE: Tech Review: 4

## 2014-05-24 MED ORDER — FOLIC ACID 1 MG PO TABS
2.0000 mg | ORAL_TABLET | Freq: Every day | ORAL | Status: DC
Start: 2014-05-24 — End: 2016-02-24

## 2014-05-24 NOTE — Telephone Encounter (Signed)
Mailed feb. schedule °
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
## 2014-05-27 ENCOUNTER — Ambulatory Visit: Payer: Managed Care, Other (non HMO) | Admitting: Physical Therapy

## 2014-05-27 LAB — IRON AND TIBC CHCC
%SAT: 23 % (ref 21–57)
Iron: 67 ug/dL (ref 41–142)
TIBC: 296 ug/dL (ref 236–444)
UIBC: 229 ug/dL (ref 120–384)

## 2014-05-27 LAB — FERRITIN CHCC: Ferritin: 1639 ng/ml — ABNORMAL HIGH (ref 9–269)

## 2014-05-28 LAB — HEMOGLOBINOPATHY EVALUATION
Hemoglobin Other: 0 %
Hgb A2 Quant: 4.8 % — ABNORMAL HIGH (ref 2.2–3.2)
Hgb A: 93.7 % — ABNORMAL LOW (ref 96.8–97.8)
Hgb F Quant: 1.5 % (ref 0.0–2.0)
Hgb S Quant: 0 %

## 2014-05-28 LAB — RETICULOCYTES (CHCC)
ABS Retic: 202.8 10*3/uL — ABNORMAL HIGH (ref 19.0–186.0)
RBC.: 5.07 MIL/uL (ref 3.87–5.11)
Retic Ct Pct: 4 % — ABNORMAL HIGH (ref 0.4–2.3)

## 2014-05-28 LAB — ERYTHROPOIETIN: Erythropoietin: 34.4 m[IU]/mL — ABNORMAL HIGH (ref 2.6–18.5)

## 2014-05-30 ENCOUNTER — Ambulatory Visit: Payer: Managed Care, Other (non HMO) | Admitting: Rehabilitation

## 2014-05-30 DIAGNOSIS — Z5189 Encounter for other specified aftercare: Secondary | ICD-10-CM | POA: Diagnosis not present

## 2014-06-05 ENCOUNTER — Ambulatory Visit: Payer: Managed Care, Other (non HMO) | Admitting: Rehabilitation

## 2014-06-06 ENCOUNTER — Ambulatory Visit: Payer: Managed Care, Other (non HMO) | Admitting: Family Medicine

## 2014-06-07 ENCOUNTER — Encounter: Payer: Self-pay | Admitting: Family Medicine

## 2014-06-07 ENCOUNTER — Encounter (INDEPENDENT_AMBULATORY_CARE_PROVIDER_SITE_OTHER): Payer: Self-pay

## 2014-06-07 ENCOUNTER — Ambulatory Visit (INDEPENDENT_AMBULATORY_CARE_PROVIDER_SITE_OTHER): Payer: Managed Care, Other (non HMO) | Admitting: Family Medicine

## 2014-06-07 VITALS — BP 126/78 | HR 84 | Ht 64.0 in | Wt 250.0 lb

## 2014-06-07 DIAGNOSIS — M5441 Lumbago with sciatica, right side: Secondary | ICD-10-CM

## 2014-06-07 NOTE — Patient Instructions (Signed)
Continue with physical therapy for 2 weeks and home exercises for at least 6 more weeks. Robaxin as needed for muscle spasms. Ibuprofen or aleve as needed for pain, inflammation. Return to full duty but leave for physical therapy appointments for next 2 weeks. Follow up with me in 6 weeks or as needed.

## 2014-06-12 ENCOUNTER — Encounter: Payer: Self-pay | Admitting: Family Medicine

## 2014-06-12 NOTE — Assessment & Plan Note (Signed)
consistent with lumbar radiculopathy from disc bulge into right leg, likely from the L4-5 bulge on MRI.  S/p 2 ESIs, physical therapy and doing home exercises/working out at gym also.  Continue PT for 2 more weeks - return to full duty at work but allow for her to go to PT visits.  Robaxin, nsaids if needed.  F/u in 6 weeks or prn.

## 2014-06-12 NOTE — Progress Notes (Signed)
Patient ID: Abigail Hall, female   DOB: May 02, 1981, 33 y.o.   MRN: [id number]  PCP: No primary provider on file.  Subjective:   HPI: Patient is a 33 y.o. female here for low back pain.  7/17: Patient reports she has had low back pain since Monday. Pain mostly radiates down into right thigh. Difficulty staying in one position. Tried pain medicine, ibuprofen. No left sided issues. Pain is constant. Slight numbness into right leg also. No bowel/bladder dysfunction. Pain level 9/10.  8/17: Patient reports mild improvement since last visit. Right leg feels better. Able to walk better also. Hard to stay in same position for a long time. Using muscle relaxant, aleve, pain medication as needed. No bowel/bladder dysfunction.  9/14: Patient reports she feels 40% improved from last visit. Doing physical therapy but only a couple visits to date. Some radiation into right leg still. Back is tight. Has done well with 2 ESIs also. No bowel/bladder dysfunction. Flexeril makes her feel sick - no longer taking.  10/23: Patient reports she is doing better. Currently has no pain. Doing physical therapy, home exercises, and working out at the gym. Right leg much better as well.  Past Medical History  Diagnosis Date  . Hemorrhoids   . Rectal bleeding   . Constipation   . Nausea & vomiting     resolved after delivery - C/S, extreme  . Rectal pain   . Weakness     resolved after C/S delivery  . Generalized headaches   . Anemia   . Thalassemia   . Diabetes mellitus     gestational DM 2013 pregnancy-resolved  . Anemia, iron deficiency 04/10/2012  . Depression 04/26/13    history - no current problems  . Seasonal allergies   . Seizures     last seizure was  08/2012  . History of blood transfusion 02/2013    [facility]  2 units transfused     Current Outpatient Prescriptions on File Prior to Visit  Medication Sig Dispense Refill  . divalproex (DEPAKOTE ER) 500 MG 24 hr  tablet Take 500 mg by mouth 2 (two) times daily.       . fluticasone (FLONASE) 50 MCG/ACT nasal spray Place 1 spray into the nose daily as needed for allergies.       . folic acid (FOLVITE) 1 MG tablet Take 2 tablets (2 mg total) by mouth daily.  60 tablet  12  . HYDROcodone-acetaminophen (NORCO) 5-325 MG per tablet Take 1 tablet by mouth every 6 (six) hours as needed for moderate pain.  40 tablet  0  . Lacosamide (VIMPAT) 100 MG TABS Take 100 mg by mouth 2 (two) times daily.      . methocarbamol (ROBAXIN) 500 MG tablet Take 1 tablet (500 mg total) by mouth every 8 (eight) hours as needed for muscle spasms.  60 tablet  1  . vitamin C (ASCORBIC ACID) 500 MG tablet Take 500 mg by mouth daily.       No current facility-administered medications on file prior to visit.    Past Surgical History  Procedure Laterality Date  . Tongue surgery    . Carpal tunnel release  01/2009    right  . Wisdom tooth extraction    . Cesarean section  12/31/2011    Procedure: CESAREAN SECTION;  Surgeon: Jolayne Haines, MD;  Location: [facility];  Service: Gynecology;  Laterality: N/A;  . Laparoscopic tubal ligation  03/27/2012    Procedure: LAPAROSCOPIC TUBAL LIGATION;  Surgeon: Jolayne Haines, MD;  Location: [facility];  Service: Gynecology;  Laterality: Bilateral;  fallopian tubes caurtery  . Labioplasty  03/27/2012    Procedure: LABIAPLASTY;  Surgeon: Jolayne Haines, MD;  Location: [facility];  Service: Gynecology;  Laterality: N/A;  labia  . Tubal ligation    . Laparoscopy      removal of cyst  . Dilation and curettage of uterus      endometrial ablation  . Abdominal hysterectomy    . Robotic assisted total hysterectomy N/A 05/02/2013    Procedure: ROBOTIC ASSISTED TOTAL HYSTERECTOMY;  Surgeon: Marvene Staff, MD;  Location: [facility];  Service: Gynecology;  Laterality: N/A;  . Unilateral salpingectomy Right 05/02/2013    Procedure: UNILATERAL SALPINGECTOMY;  Surgeon: Marvene Staff, MD;  Location: [facility];   Service: Gynecology;  Laterality: Right;  . Transanal hemorrhoidal dearterialization N/A 05/31/2013    Procedure: [place] HEMORRHOIDAL LIGATION/PEXY;  Surgeon: Adin Hector, MD;  Location: [facility];  Service: General;  Laterality: N/A;  . Evaluation under anesthesia with hemorrhoidectomy N/A 05/31/2013    Procedure: EXAM UNDER ANESTHESIA WITH EXTERNAL HEMORRHOIDECTOMY;  Surgeon: Adin Hector, MD;  Location: [facility];  Service: General;  Laterality: N/A;    Allergies  Allergen Reactions  . Latex Itching, Swelling and Rash    Where touched.  . Keflex [Cephalexin] Hives    History   Social History  . Marital Status: Single    Spouse Name: N/A    Number of Children: N/A  . Years of Education: N/A   Occupational History  . Not on file.   Social History Main Topics  . Smoking status: Never Smoker   . Smokeless tobacco: Never Used     Comment: never used tobacco  . Alcohol Use: No  . Drug Use: No  . Sexual Activity: Yes    Birth Control/ Protection: None   Other Topics Concern  . Not on file   Social History Narrative  . No narrative on file    Family History  Problem Relation Age of Onset  . Anesthesia problems Neg Hx   . Hypotension Neg Hx   . Malignant hyperthermia Neg Hx   . Pseudochol deficiency Neg Hx   . Diabetes Mother   . Hyperlipidemia Father   . Diabetes Maternal Grandmother   . Cancer Maternal Grandfather   . Hyperlipidemia Paternal Grandfather     BP 126/78  Pulse 84  Ht 5\' 4"  (1.626 m)  Wt 250 lb (113.399 kg)  BMI 42.89 kg/m2  LMP 04/25/2013  Review of Systems: See HPI above.    Objective:  Physical Exam:  Gen: NAD  Back: No gross deformity, scoliosis. Minimal TTP right lumbar paraspinal region.  No midline or bony TTP. FROM with pain on flexion. Strength LEs 5/5 all muscle groups.   2+ MSRs in patellar and achilles tendons, equal bilaterally. Negative SLRs. Sensation intact to light touch bilaterally. Negative logroll bilateral hips     Assessment & Plan:  1. Low back pain - consistent with lumbar radiculopathy from disc bulge into right leg, likely from the L4-5 bulge on MRI.  S/p 2 ESIs, physical therapy and doing home exercises/working out at gym also.  Continue PT for 2 more weeks - return to full duty at work but allow for her to go to PT visits.  Robaxin, nsaids if needed.  F/u in 6 weeks or prn.

## 2014-06-17 ENCOUNTER — Encounter: Payer: Self-pay | Admitting: Family Medicine

## 2014-06-19 IMAGING — CR DG CHEST 2V
2 series · 2 of 2 positions shown · non-contrast
Comparison: 02/27/2013

CLINICAL DATA: Shortness of breath, crackles at bases, nausea,
vomiting, fever

CHEST - 2 VIEW

[w chest pa]
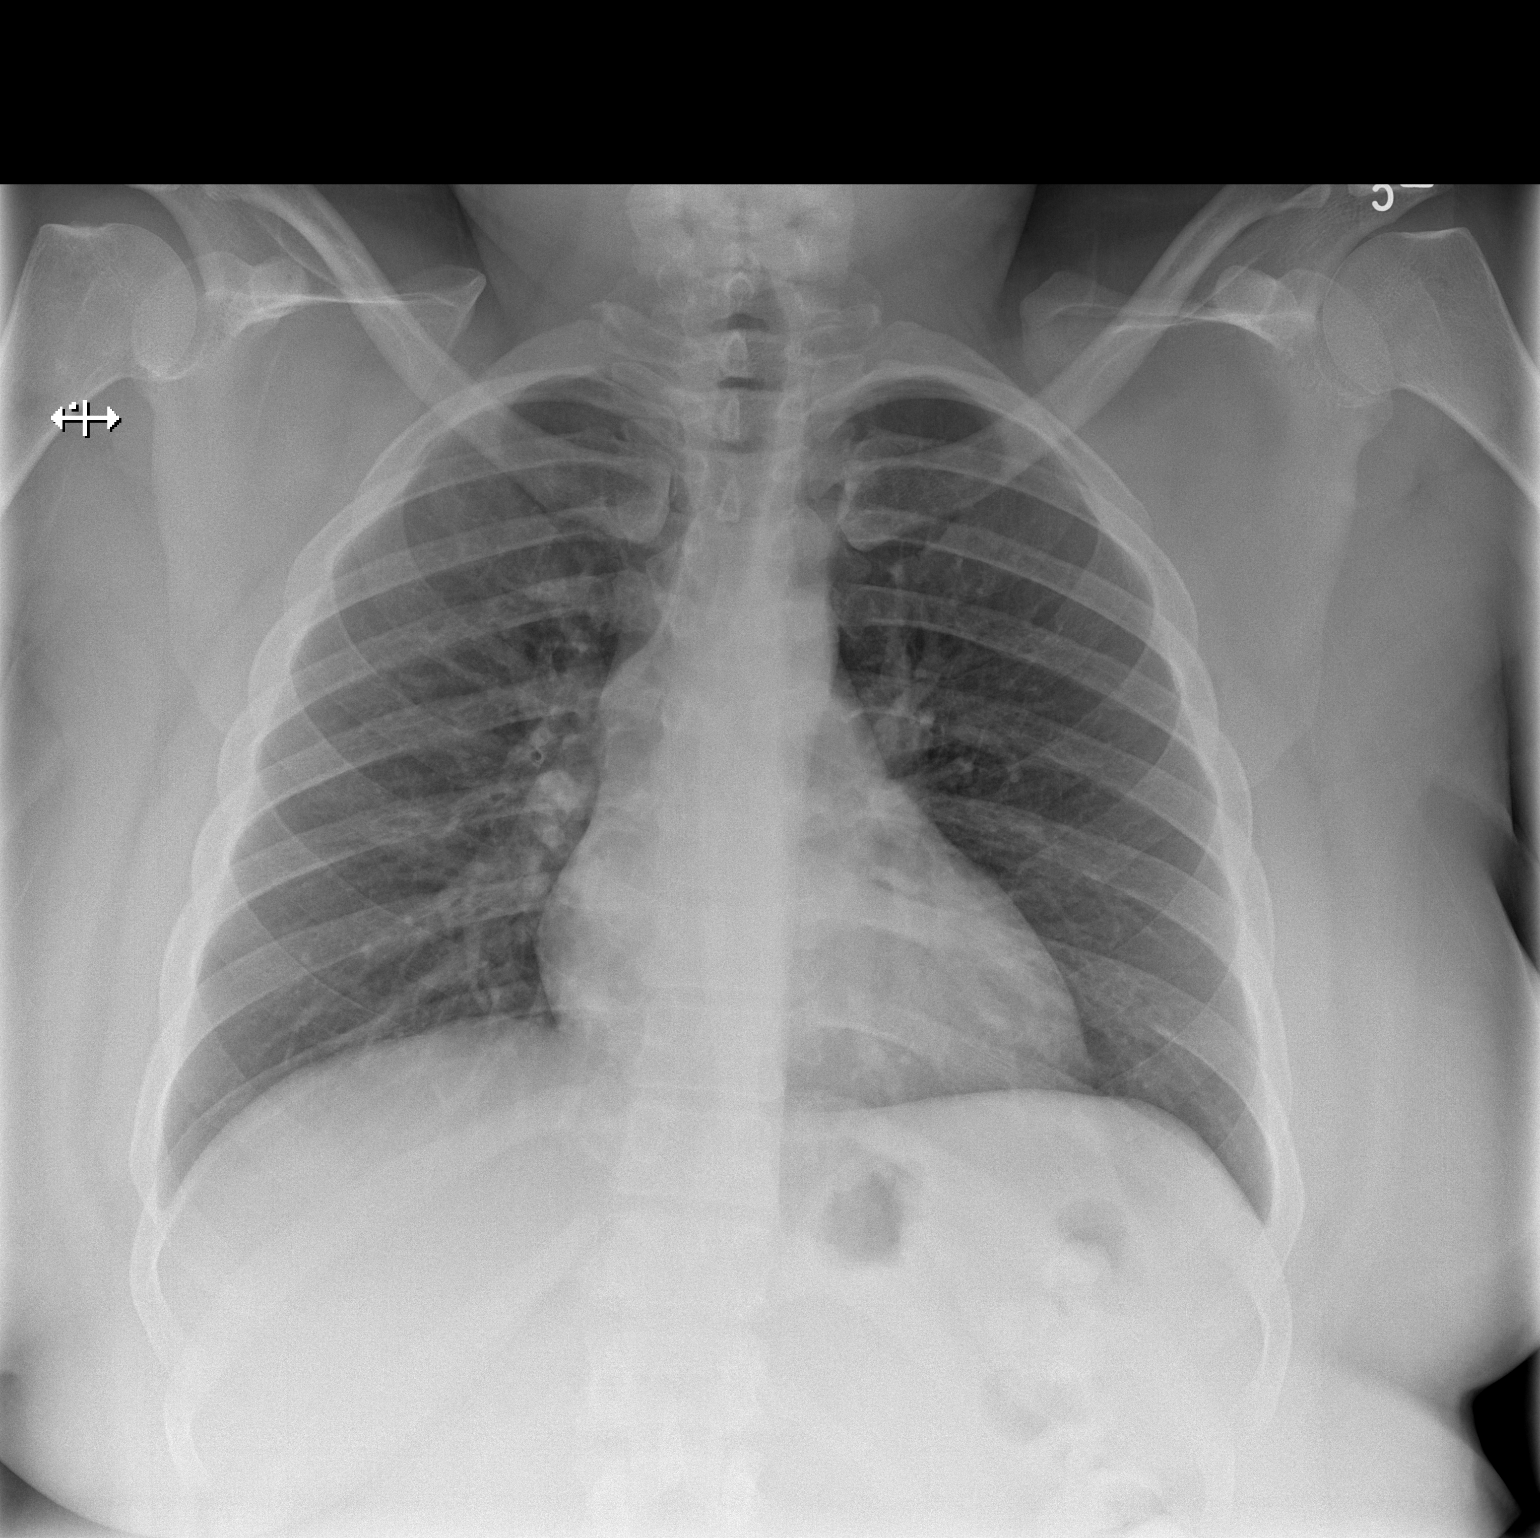

[w chest lat]
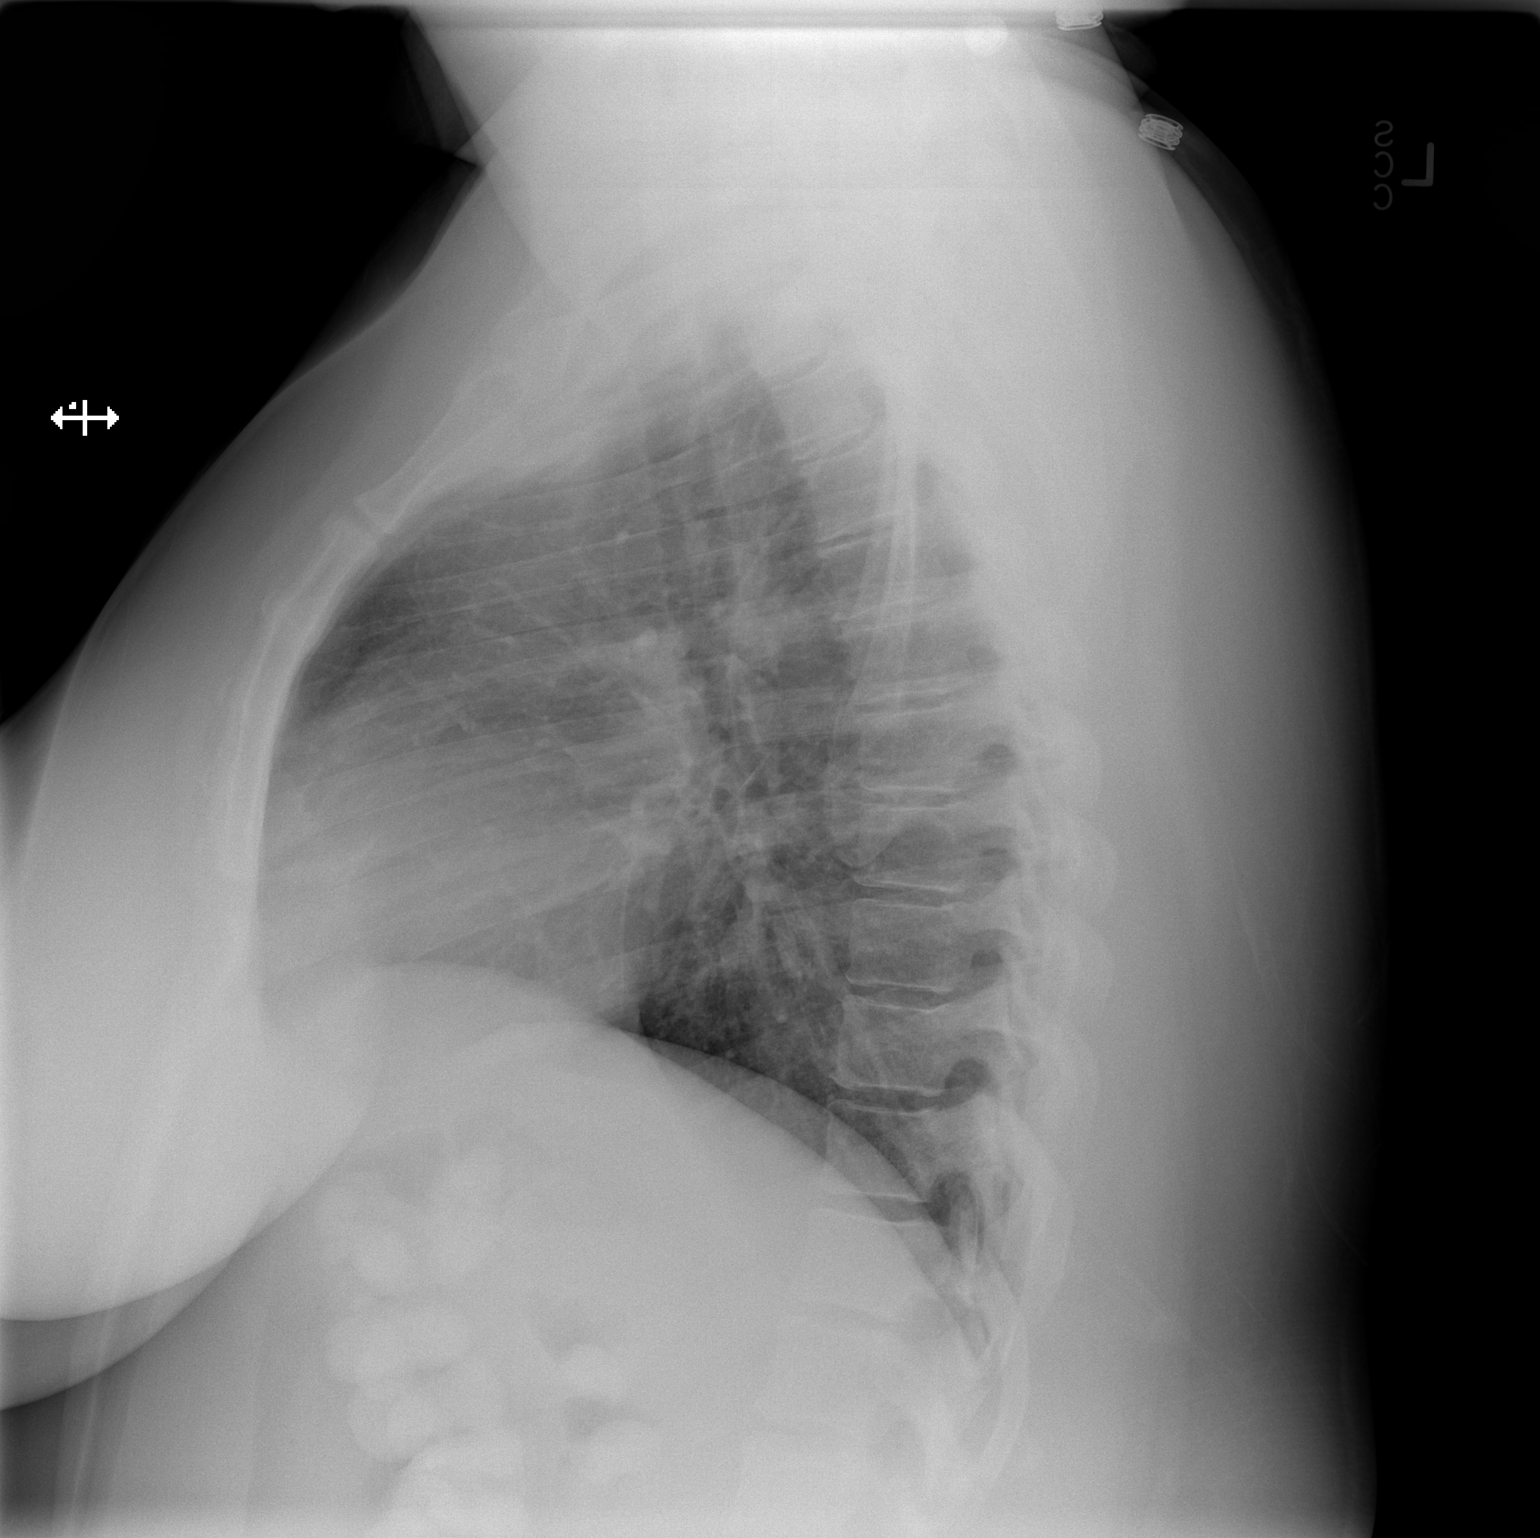

[2 of 2 positions shown; findings below may reference images not displayed]

FINDINGS: Normal heart size, mediastinal contours, and pulmonary vascularity.
Minimal bronchitic changes.
No pulmonary infiltrate, pleural effusion or pneumothorax.
No acute osseous findings.
IMPRESSION: Minimal bronchitic changes.

## 2014-08-15 ENCOUNTER — Other Ambulatory Visit: Payer: Self-pay | Admitting: *Deleted

## 2014-08-15 MED ORDER — HYDROCODONE-ACETAMINOPHEN 5-325 MG PO TABS: 1.0000 | ORAL_TABLET | Freq: Four times a day (QID) | ORAL | Status: DC | PRN

## 2014-08-15 NOTE — Addendum Note (Signed)
Addended by: Dene Gentry on: 08/15/2014 11:56 AM   Modules accepted: Orders

## 2014-09-27 ENCOUNTER — Ambulatory Visit: Payer: Managed Care, Other (non HMO) | Admitting: Hematology & Oncology

## 2014-09-27 ENCOUNTER — Other Ambulatory Visit: Payer: Managed Care, Other (non HMO) | Admitting: Lab

## 2014-09-27 ENCOUNTER — Telehealth: Payer: Self-pay | Admitting: Hematology & Oncology

## 2014-09-27 NOTE — Telephone Encounter (Signed)
Left pt message to call and reschedule missed appointment °

## 2014-10-15 ENCOUNTER — Telehealth: Payer: Self-pay | Admitting: *Deleted

## 2014-10-15 ENCOUNTER — Other Ambulatory Visit: Payer: Self-pay | Admitting: *Deleted

## 2014-10-15 MED ORDER — METAXALONE 800 MG PO TABS: ORAL_TABLET | ORAL | Status: DC

## 2014-10-15 NOTE — Telephone Encounter (Signed)
Patient called and wanted to know about getting a refill on the Norco. Having pain in the right leg. Last OV was on 06-07-14.

## 2014-10-15 NOTE — Telephone Encounter (Signed)
Spoke to patient and told her the options. She wanted to do the muscle relaxant. Called in prescription to the pharmacy.

## 2014-10-15 NOTE — Telephone Encounter (Signed)
I do not recommend additional narcotic pain medication for this issue with or without an appointment.  We could do a prescription muscle relaxant or anti-inflammatory if she would like.  Repeating an ESI, physical therapy are options if this is the same pain as before.  If different would see her back for an appointment.

## 2015-05-22 ENCOUNTER — Ambulatory Visit: Payer: Managed Care, Other (non HMO) | Admitting: Family Medicine

## 2015-05-26 ENCOUNTER — Ambulatory Visit (INDEPENDENT_AMBULATORY_CARE_PROVIDER_SITE_OTHER): Payer: BLUE CROSS/BLUE SHIELD | Admitting: Family Medicine

## 2015-05-26 ENCOUNTER — Encounter: Payer: Self-pay | Admitting: Family Medicine

## 2015-05-26 VITALS — BP 124/75 | HR 91 | Ht 64.0 in | Wt 225.0 lb

## 2015-05-26 DIAGNOSIS — G5602 Carpal tunnel syndrome, left upper limb: Secondary | ICD-10-CM

## 2015-05-26 DIAGNOSIS — M5441 Lumbago with sciatica, right side: Secondary | ICD-10-CM

## 2015-05-26 MED ORDER — DICLOFENAC SODIUM 75 MG PO TBEC: 75.0000 mg | DELAYED_RELEASE_TABLET | Freq: Two times a day (BID) | ORAL | Status: DC

## 2015-05-26 MED ORDER — METHOCARBAMOL 500 MG PO TABS: 500.0000 mg | ORAL_TABLET | Freq: Three times a day (TID) | ORAL | Status: DC | PRN

## 2015-05-26 NOTE — Patient Instructions (Addendum)
You have carpal tunnel syndrome. Wear the wrist brace at nighttime and as often as possible during the day A prednisone dose pack is a consideration. Corticosteroid injection is a consideration to help with pain and inflammation  We will set you up with another epidural steroid injection. I have sent in the muscle relaxant as well. Voltaren twice a day with food for pain and inflammation.  Do home exercises for right elbow (hammer exercise, wrist extension) and shoulder (theraband 3 sets of 10). Follow up with me in 1 month.

## 2015-05-27 ENCOUNTER — Other Ambulatory Visit: Payer: Self-pay | Admitting: Family Medicine

## 2015-05-27 DIAGNOSIS — G5602 Carpal tunnel syndrome, left upper limb: Secondary | ICD-10-CM | POA: Insufficient documentation

## 2015-05-27 DIAGNOSIS — M5416 Radiculopathy, lumbar region: Secondary | ICD-10-CM

## 2015-05-27 NOTE — Assessment & Plan Note (Signed)
wrist brace at night and as often as possible during the day.  Consider prednisone, steroid injection.

## 2015-05-27 NOTE — Progress Notes (Signed)
Patient ID: Abigail Hall, female   DOB: Apr 28, 1981, 34 y.o.   MRN: [id number]  PCP: No primary care provider on file.  Subjective:   HPI: Patient is a 34 y.o. female here for low back pain.  7/17: Patient reports she has had low back pain since Monday. Pain mostly radiates down into right thigh. Difficulty staying in one position. Tried pain medicine, ibuprofen. No left sided issues. Pain is constant. Slight numbness into right leg also. No bowel/bladder dysfunction. Pain level 9/10.  8/17: Patient reports mild improvement since last visit. Right leg feels better. Able to walk better also. Hard to stay in same position for a long time. Using muscle relaxant, aleve, pain medication as needed. No bowel/bladder dysfunction.  9/14: Patient reports she feels 40% improved from last visit. Doing physical therapy but only a couple visits to date. Some radiation into right leg still. Back is tight. Has done well with 2 ESIs also. No bowel/bladder dysfunction. Flexeril makes her feel sick - no longer taking.  06/07/14: Patient reports she is doing better. Currently has no pain. Doing physical therapy, home exercises, and working out at the gym. Right leg much better as well.  05/26/15: Patient returns with persistent right sided low back pain. Radiation into right leg. Pain level 8/10, sharp up to 10/10 at times. No bowel/bladder dysfunction. Worse with all motions, prolonged sitting. Also reporting sharp pain in left hand also to 10/10 level from forearm into fingers. Right handed. Worse with wrist motions. Has a wrist brace for right hand only.  Past Medical History  Diagnosis Date  . Hemorrhoids   . Rectal bleeding   . Constipation   . Nausea & vomiting     resolved after delivery - C/S, extreme  . Rectal pain   . Weakness     resolved after C/S delivery  . Generalized headaches   . Anemia   . Thalassemia   . Diabetes mellitus     gestational DM 2013  pregnancy-resolved  . Anemia, iron deficiency 04/10/2012  . Depression 04/26/13    history - no current problems  . Seasonal allergies   . Seizures ([place])     last seizure was  08/2012  . History of blood transfusion 02/2013    [facility]  2 units transfused     Current Outpatient Prescriptions on File Prior to Visit  Medication Sig Dispense Refill  . divalproex (DEPAKOTE ER) 500 MG 24 hr tablet Take 500 mg by mouth 2 (two) times daily.     . fluticasone (FLONASE) 50 MCG/ACT nasal spray Place 1 spray into the nose daily as needed for allergies.     . folic acid (FOLVITE) 1 MG tablet Take 2 tablets (2 mg total) by mouth daily. 60 tablet 12  . HYDROcodone-acetaminophen (NORCO) 5-325 MG per tablet Take 1 tablet by mouth every 6 (six) hours as needed for moderate pain. 40 tablet 0  . Lacosamide (VIMPAT) 100 MG TABS Take 100 mg by mouth 2 (two) times daily.    . metaxalone (SKELAXIN) 800 MG tablet Take one tablet by mouth every 8 hours as needed 60 tablet 1  . vitamin C (ASCORBIC ACID) 500 MG tablet Take 500 mg by mouth daily.     No current facility-administered medications on file prior to visit.    Past Surgical History  Procedure Laterality Date  . Tongue surgery    . Carpal tunnel release  01/2009    right  . Wisdom tooth extraction    .  Cesarean section  12/31/2011    Procedure: CESAREAN SECTION;  Surgeon: Jolayne Haines, MD;  Location: [facility];  Service: Gynecology;  Laterality: N/A;  . Laparoscopic tubal ligation  03/27/2012    Procedure: LAPAROSCOPIC TUBAL LIGATION;  Surgeon: Jolayne Haines, MD;  Location: [facility];  Service: Gynecology;  Laterality: Bilateral;  fallopian tubes caurtery  . Labioplasty  03/27/2012    Procedure: LABIAPLASTY;  Surgeon: Jolayne Haines, MD;  Location: [facility];  Service: Gynecology;  Laterality: N/A;  labia  . Tubal ligation    . Laparoscopy      removal of cyst  . Dilation and curettage of uterus      endometrial ablation  . Abdominal hysterectomy    .  Robotic assisted total hysterectomy N/A 05/02/2013    Procedure: ROBOTIC ASSISTED TOTAL HYSTERECTOMY;  Surgeon: Marvene Staff, MD;  Location: [facility];  Service: Gynecology;  Laterality: N/A;  . Unilateral salpingectomy Right 05/02/2013    Procedure: UNILATERAL SALPINGECTOMY;  Surgeon: Marvene Staff, MD;  Location: [facility];  Service: Gynecology;  Laterality: Right;  . Transanal hemorrhoidal dearterialization N/A 05/31/2013    Procedure: [place] HEMORRHOIDAL LIGATION/PEXY;  Surgeon: Adin Hector, MD;  Location: [facility];  Service: General;  Laterality: N/A;  . Evaluation under anesthesia with hemorrhoidectomy N/A 05/31/2013    Procedure: EXAM UNDER ANESTHESIA WITH EXTERNAL HEMORRHOIDECTOMY;  Surgeon: Adin Hector, MD;  Location: [facility];  Service: General;  Laterality: N/A;    Allergies  Allergen Reactions  . Latex Itching, Swelling and Rash    Where touched.  . Keflex [Cephalexin] Hives    Social History   Social History  . Marital Status: Single    Spouse Name: N/A  . Number of Children: N/A  . Years of Education: N/A   Occupational History  . Not on file.   Social History Main Topics  . Smoking status: Never Smoker   . Smokeless tobacco: Never Used     Comment: never used tobacco  . Alcohol Use: No  . Drug Use: No  . Sexual Activity: Yes    Birth Control/ Protection: None   Other Topics Concern  . Not on file   Social History Narrative    Family History  Problem Relation Age of Onset  . Anesthesia problems Neg Hx   . Hypotension Neg Hx   . Malignant hyperthermia Neg Hx   . Pseudochol deficiency Neg Hx   . Diabetes Mother   . Hyperlipidemia Father   . Diabetes Maternal Grandmother   . Cancer Maternal Grandfather   . Hyperlipidemia Paternal Grandfather     BP 124/75 mmHg  Pulse 91  Ht 5\' 4"  (1.626 m)  Wt 225 lb (102.059 kg)  BMI 38.60 kg/m2  LMP 04/25/2013  Review of Systems: See HPI above.    Objective:  Physical Exam:  Gen:  NAD  Back: No gross deformity, scoliosis. Mild TTP right lumbar paraspinal region.  No midline or bony TTP. FROM with pain on flexion. Strength LEs 5/5 all muscle groups.   2+ MSRs in patellar and achilles tendons, equal bilaterally. Negative SLRs. Sensation intact to light touch bilaterally.  Bilateral hips: Negative logroll bilateral hips Negative fabers, piriformis stretches.  Left wrist/hand: No gross deformity, swelling, bruising. FROM all digits, wrist without pain. Mild positive tinels, negative phalens. Strength 4/5 with thumb opposition, 5/5 other motions. Sensation intact currently all digits.    Assessment & Plan:  1. Low back pain - consistent with lumbar radiculopathy from  disc bulge into right leg, likely from the L4-5 bulge on MRI.  S/p 2 ESIs, physical therapy and doing home exercises/working out at gym also.  Set up a repeat ESI, try voltaren twice a day with food and robaxin as needed for spasms.  Consider repeating PT.    2. Left carpal tunnel syndrome - wrist brace at night and as often as possible during the day.  Consider prednisone, steroid injection.  Note at end of visit patient asked me for narcotic pain medication twice - according to narcotic database she has been getting 120 oxycodone roughly monthly from a physician in [place] - she did not notify me of this.  Advised her we would not be filling narcotic pain medication.

## 2015-05-27 NOTE — Assessment & Plan Note (Signed)
consistent with lumbar radiculopathy from disc bulge into right leg, likely from the L4-5 bulge on MRI.  S/p 2 ESIs, physical therapy and doing home exercises/working out at gym also.  Set up a repeat ESI, try voltaren twice a day with food and robaxin as needed for spasms.  Consider repeating PT.

## 2015-06-06 ENCOUNTER — Ambulatory Visit
Admission: RE | Admit: 2015-06-06 | Discharge: 2015-06-06 | Disposition: A | Discharge status: Home / Self Care | Payer: BLUE CROSS/BLUE SHIELD | Source: Ambulatory Visit | Attending: Family Medicine | Admitting: Family Medicine

## 2015-06-06 DIAGNOSIS — M5416 Radiculopathy, lumbar region: Secondary | ICD-10-CM

## 2015-06-06 MED ORDER — IOHEXOL 180 MG/ML  SOLN
1.0000 mL | Freq: Once | INTRAMUSCULAR | Status: DC | PRN
  Administered 2015-06-06: 1 mL via EPIDURAL

## 2015-06-06 MED ORDER — METHYLPREDNISOLONE ACETATE 40 MG/ML INJ SUSP (RADIOLOG
120.0000 mg | Freq: Once | INTRAMUSCULAR | Status: AC
  Administered 2015-06-06: 120 mg via EPIDURAL

## 2015-06-06 NOTE — Discharge Instructions (Signed)

## 2015-06-20 ENCOUNTER — Telehealth: Payer: Self-pay | Admitting: *Deleted

## 2015-06-20 NOTE — Telephone Encounter (Signed)
Patient called stating that the robaxin and voltaren are not helping. Wants to know what else to take. Stated that flexeril helped before.

## 2015-06-23 NOTE — Telephone Encounter (Signed)
Ok to send or call in flexeril 10mg  three times a day as needed for muscle spasms.  #60 with 1 refill.  Thanks!

## 2015-06-25 ENCOUNTER — Encounter: Payer: Self-pay | Admitting: Family Medicine

## 2015-06-25 ENCOUNTER — Ambulatory Visit (INDEPENDENT_AMBULATORY_CARE_PROVIDER_SITE_OTHER): Payer: Self-pay | Admitting: Family Medicine

## 2015-06-25 ENCOUNTER — Encounter (INDEPENDENT_AMBULATORY_CARE_PROVIDER_SITE_OTHER): Payer: Self-pay

## 2015-06-25 VITALS — BP 119/75 | HR 90 | Ht 64.0 in | Wt 230.0 lb

## 2015-06-25 DIAGNOSIS — M5441 Lumbago with sciatica, right side: Secondary | ICD-10-CM

## 2015-06-25 MED ORDER — CYCLOBENZAPRINE HCL 10 MG PO TABS: ORAL_TABLET | ORAL | Status: DC

## 2015-06-25 NOTE — Patient Instructions (Signed)
Take flexeril as needed. We will go ahead with an ESI on the left side this time. We can repeat this a third time (you can get 3 in 6 months if needed). Call me if you want to do the third one OR if you want to get a new MRI if this shot doesn't work.

## 2015-06-26 NOTE — Assessment & Plan Note (Signed)
consistent with lumbar radiculopathy likely from the L4-5 bulge on MRI.  S/p 3 ESIs over the past year, physical therapy and doing home exercises/working out at gym also.  She would like to try ESI on left side again - will arrange.  Could get a third if incomplete relief.  Other option would be to repeat her imaging (MRI).  Flexeril as needed.

## 2015-06-26 NOTE — Progress Notes (Signed)
Patient ID: Abigail Hall, female   DOB: May 26, 1981, 34 y.o.   MRN: [id number]  PCP: No primary care provider on file.  Subjective:   HPI: Patient is a 34 y.o. female here for low back pain.  7/17: Patient reports she has had low back pain since Monday. Pain mostly radiates down into right thigh. Difficulty staying in one position. Tried pain medicine, ibuprofen. No left sided issues. Pain is constant. Slight numbness into right leg also. No bowel/bladder dysfunction. Pain level 9/10.  8/17: Patient reports mild improvement since last visit. Right leg feels better. Able to walk better also. Hard to stay in same position for a long time. Using muscle relaxant, aleve, pain medication as needed. No bowel/bladder dysfunction.  9/14: Patient reports she feels 40% improved from last visit. Doing physical therapy but only a couple visits to date. Some radiation into right leg still. Back is tight. Has done well with 2 ESIs also. No bowel/bladder dysfunction. Flexeril makes her feel sick - no longer taking.  06/07/14: Patient reports she is doing better. Currently has no pain. Doing physical therapy, home exercises, and working out at the gym. Right leg much better as well.  05/26/15: Patient returns with persistent right sided low back pain. Radiation into right leg. Pain level 8/10, sharp up to 10/10 at times. No bowel/bladder dysfunction. Worse with all motions, prolonged sitting. Also reporting sharp pain in left hand also to 10/10 level from forearm into fingers. Right handed. Worse with wrist motions. Has a wrist brace for right hand only.  11/9: Patient reports she's still having low back pain at 8/10 level mainly on left side now more than the right. Had [facility] 10/21 which made pain less harsh but it's still sharp. Worse with all motions. No bowel/bladder dysfunction. No skin changes, fever, other complaints.  Past Medical History  Diagnosis Date  .  Hemorrhoids   . Rectal bleeding   . Constipation   . Nausea & vomiting     resolved after delivery - C/S, extreme  . Rectal pain   . Weakness     resolved after C/S delivery  . Generalized headaches   . Anemia   . Thalassemia   . Diabetes mellitus     gestational DM 2013 pregnancy-resolved  . Anemia, iron deficiency 04/10/2012  . Depression 04/26/13    history - no current problems  . Seasonal allergies   . Seizures ([place])     last seizure was  08/2012  . History of blood transfusion 02/2013    [facility]  2 units transfused     Current Outpatient Prescriptions on File Prior to Visit  Medication Sig Dispense Refill  . ALPRAZolam (XANAX) 1 MG tablet TK 1 T PO TID PRN  2  . diclofenac (VOLTAREN) 75 MG EC tablet Take 1 tablet (75 mg total) by mouth 2 (two) times daily. 60 tablet 1  . divalproex (DEPAKOTE ER) 500 MG 24 hr tablet Take 500 mg by mouth 2 (two) times daily.     . fluticasone (FLONASE) 50 MCG/ACT nasal spray Place 1 spray into the nose daily as needed for allergies.     . folic acid (FOLVITE) 1 MG tablet Take 2 tablets (2 mg total) by mouth daily. 60 tablet 12  . Lacosamide (VIMPAT) 100 MG TABS Take 100 mg by mouth 2 (two) times daily.    . methocarbamol (ROBAXIN) 500 MG tablet Take 1 tablet (500 mg total) by mouth every 8 (eight) hours as needed for  muscle spasms. 60 tablet 1  . oxyCODONE (OXY IR/ROXICODONE) 5 MG immediate release tablet     . vitamin C (ASCORBIC ACID) 500 MG tablet Take 500 mg by mouth daily.     No current facility-administered medications on file prior to visit.    Past Surgical History  Procedure Laterality Date  . Tongue surgery    . Carpal tunnel release  01/2009    right  . Wisdom tooth extraction    . Cesarean section  12/31/2011    Procedure: CESAREAN SECTION;  Surgeon: Jolayne Haines, MD;  Location: [place] [facility];  Service: Gynecology;  Laterality: N/A;  . Laparoscopic tubal ligation  03/27/2012    Procedure: LAPAROSCOPIC TUBAL LIGATION;   Surgeon: Jolayne Haines, MD;  Location: [place] [facility];  Service: Gynecology;  Laterality: Bilateral;  fallopian tubes caurtery  . Labioplasty  03/27/2012    Procedure: LABIAPLASTY;  Surgeon: Jolayne Haines, MD;  Location: [place] [facility];  Service: Gynecology;  Laterality: N/A;  labia  . Tubal ligation    . Laparoscopy      removal of cyst  . Dilation and curettage of uterus      endometrial ablation  . Abdominal hysterectomy    . Robotic assisted total hysterectomy N/A 05/02/2013    Procedure: ROBOTIC ASSISTED TOTAL HYSTERECTOMY;  Surgeon: Marvene Staff, MD;  Location: [place] [facility];  Service: Gynecology;  Laterality: N/A;  . Unilateral salpingectomy Right 05/02/2013    Procedure: UNILATERAL SALPINGECTOMY;  Surgeon: Marvene Staff, MD;  Location: [place] [facility];  Service: Gynecology;  Laterality: Right;  . Transanal hemorrhoidal dearterialization N/A 05/31/2013    Procedure: [place] HEMORRHOIDAL LIGATION/PEXY;  Surgeon: Adin Hector, MD;  Location: [facility];  Service: General;  Laterality: N/A;  . Evaluation under anesthesia with hemorrhoidectomy N/A 05/31/2013    Procedure: EXAM UNDER ANESTHESIA WITH EXTERNAL HEMORRHOIDECTOMY;  Surgeon: Adin Hector, MD;  Location: [facility];  Service: General;  Laterality: N/A;    Allergies  Allergen Reactions  . Latex Itching, Swelling and Rash    Where touched.  . Keflex [Cephalexin] Hives    Social History   Social History  . Marital Status: Single    Spouse Name: N/A  . Number of Children: N/A  . Years of Education: N/A   Occupational History  . Not on file.   Social History Main Topics  . Smoking status: Never Smoker   . Smokeless tobacco: Never Used     Comment: never used tobacco  . Alcohol Use: No  . Drug Use: No  . Sexual Activity: Yes    Birth Control/ Protection: None   Other Topics Concern  . Not on file   Social History Narrative    Family History  Problem Relation Age of Onset  . Anesthesia problems Neg Hx   . Hypotension Neg Hx   .  Malignant hyperthermia Neg Hx   . Pseudochol deficiency Neg Hx   . Diabetes Mother   . Hyperlipidemia Father   . Diabetes Maternal Grandmother   . Cancer Maternal Grandfather   . Hyperlipidemia Paternal Grandfather     BP 119/75 mmHg  Pulse 90  Ht 5\' 4"  (1.626 m)  Wt 230 lb (104.327 kg)  BMI 39.46 kg/m2  LMP 04/25/2013  Review of Systems: See HPI above.    Objective:  Physical Exam:  Gen: NAD  Back: No gross deformity, scoliosis. Mild TTP bilateral lumbar paraspinal regions.  No midline or bony TTP. FROM with pain on flexion. Strength LEs 5/5  all muscle groups.   2+ MSRs in patellar and achilles tendons, equal bilaterally. Negative SLRs. Sensation intact to light touch bilaterally.  Bilateral hips: Negative logroll bilateral hips Negative fabers, piriformis stretches.    Assessment & Plan:  1. Low back pain - consistent with lumbar radiculopathy likely from the L4-5 bulge on MRI.  S/p 3 ESIs over the past year, physical therapy and doing home exercises/working out at gym also.  She would like to try ESI on left side again - will arrange.  Could get a third if incomplete relief.  Other option would be to repeat her imaging (MRI).  Flexeril as needed.

## 2015-06-27 ENCOUNTER — Other Ambulatory Visit: Payer: Self-pay | Admitting: Family Medicine

## 2015-06-27 DIAGNOSIS — G8929 Other chronic pain: Secondary | ICD-10-CM

## 2015-06-27 DIAGNOSIS — M545 Other chronic pain: Secondary | ICD-10-CM

## 2015-07-02 ENCOUNTER — Ambulatory Visit
Admission: RE | Admit: 2015-07-02 | Discharge: 2015-07-02 | Disposition: A | Discharge status: Home / Self Care | Payer: BLUE CROSS/BLUE SHIELD | Source: Ambulatory Visit | Attending: Family Medicine | Admitting: Family Medicine

## 2015-07-02 ENCOUNTER — Other Ambulatory Visit: Payer: Self-pay | Admitting: Family Medicine

## 2015-07-02 DIAGNOSIS — M545 Low back pain, unspecified: Secondary | ICD-10-CM

## 2015-07-02 DIAGNOSIS — G8929 Other chronic pain: Secondary | ICD-10-CM

## 2015-07-02 MED ORDER — IOHEXOL 180 MG/ML  SOLN
1.0000 mL | Freq: Once | INTRAMUSCULAR | Status: DC | PRN
  Administered 2015-07-02: 1 mL via EPIDURAL

## 2015-07-02 MED ORDER — METHYLPREDNISOLONE ACETATE 40 MG/ML INJ SUSP (RADIOLOG
120.0000 mg | Freq: Once | INTRAMUSCULAR | Status: AC
  Administered 2015-07-02: 120 mg via EPIDURAL

## 2016-02-24 ENCOUNTER — Emergency Department (HOSPITAL_BASED_OUTPATIENT_CLINIC_OR_DEPARTMENT_OTHER): Payer: BLUE CROSS/BLUE SHIELD

## 2016-02-24 ENCOUNTER — Encounter (HOSPITAL_BASED_OUTPATIENT_CLINIC_OR_DEPARTMENT_OTHER): Payer: Self-pay

## 2016-02-24 ENCOUNTER — Emergency Department (HOSPITAL_BASED_OUTPATIENT_CLINIC_OR_DEPARTMENT_OTHER)
Admission: EM | Admit: 2016-02-24 | Discharge: 2016-02-24 | Disposition: A | Discharge status: Home / Self Care | Payer: BLUE CROSS/BLUE SHIELD | Attending: Emergency Medicine | Admitting: Emergency Medicine

## 2016-02-24 DIAGNOSIS — S9032XA Contusion of left foot, initial encounter: Secondary | ICD-10-CM | POA: Diagnosis not present

## 2016-02-24 DIAGNOSIS — Y999 Unspecified external cause status: Secondary | ICD-10-CM | POA: Insufficient documentation

## 2016-02-24 DIAGNOSIS — M79672 Pain in left foot: Secondary | ICD-10-CM

## 2016-02-24 DIAGNOSIS — F329 Major depressive disorder, single episode, unspecified: Secondary | ICD-10-CM | POA: Diagnosis not present

## 2016-02-24 DIAGNOSIS — Y939 Activity, unspecified: Secondary | ICD-10-CM | POA: Diagnosis not present

## 2016-02-24 DIAGNOSIS — T148XXA Other injury of unspecified body region, initial encounter: Secondary | ICD-10-CM

## 2016-02-24 DIAGNOSIS — Y929 Unspecified place or not applicable: Secondary | ICD-10-CM | POA: Diagnosis not present

## 2016-02-24 DIAGNOSIS — M79605 Pain in left leg: Secondary | ICD-10-CM

## 2016-02-24 DIAGNOSIS — E119 Type 2 diabetes mellitus without complications: Secondary | ICD-10-CM | POA: Diagnosis not present

## 2016-02-24 DIAGNOSIS — X58XXXA Exposure to other specified factors, initial encounter: Secondary | ICD-10-CM | POA: Insufficient documentation

## 2016-02-24 MED ORDER — IBUPROFEN 800 MG PO TABS
800.0000 mg | ORAL_TABLET | Freq: Once | ORAL | Status: AC
  Administered 2016-02-24: 800 mg via ORAL
  Filled 2016-02-24: qty 1

## 2016-02-24 MED ORDER — IBUPROFEN 600 MG PO TABS: 600.0000 mg | ORAL_TABLET | Freq: Four times a day (QID) | ORAL | Status: DC | PRN

## 2016-02-24 MED FILL — IBUPROFEN 600 MG TABLET: 600 | 7 days supply | Qty: 30 | Fill #0

## 2016-02-24 NOTE — ED Notes (Signed)
Pain to left LE-started last week-denies injury-NAD-steady gait

## 2016-02-24 NOTE — ED Provider Notes (Addendum)
CSN: [id number]     Arrival date & time 02/24/16  1346 History   First MD Initiated Contact with Patient 02/24/16 1401     Chief Complaint  Patient presents with  . Leg Pain     (Consider location/radiation/quality/duration/timing/severity/associated sxs/prior Treatment) HPI   1 week of foot and left lower leg pain, swelling in left lower leg has decreased however pain still present. 7.5/10. Worse with walking. No trauma however walks a lot. No fevers.   Past Medical History  Diagnosis Date  . Hemorrhoids   . Rectal bleeding   . Constipation   . Nausea & vomiting     resolved after delivery - C/S, extreme  . Rectal pain   . Weakness     resolved after C/S delivery  . Generalized headaches   . Anemia   . Thalassemia   . Diabetes mellitus     gestational DM 2013 pregnancy-resolved  . Anemia, iron deficiency 04/10/2012  . Depression 04/26/13    history - no current problems  . Seasonal allergies   . Seizures ([place])     last seizure was  08/2012  . History of blood transfusion 02/2013    [facility]  2 units transfused    Past Surgical History  Procedure Laterality Date  . Tongue surgery    . Carpal tunnel release  01/2009    right  . Wisdom tooth extraction    . Cesarean section  12/31/2011    Procedure: CESAREAN SECTION;  Surgeon: Jolayne Haines, MD;  Location: [place] [facility];  Service: Gynecology;  Laterality: N/A;  . Laparoscopic tubal ligation  03/27/2012    Procedure: LAPAROSCOPIC TUBAL LIGATION;  Surgeon: Jolayne Haines, MD;  Location: [place] [facility];  Service: Gynecology;  Laterality: Bilateral;  fallopian tubes caurtery  . Labioplasty  03/27/2012    Procedure: LABIAPLASTY;  Surgeon: Jolayne Haines, MD;  Location: [place] [facility];  Service: Gynecology;  Laterality: N/A;  labia  . Tubal ligation    . Laparoscopy      removal of cyst  . Dilation and curettage of uterus      endometrial ablation  . Abdominal hysterectomy    . Robotic assisted total hysterectomy N/A 05/02/2013    Procedure:  ROBOTIC ASSISTED TOTAL HYSTERECTOMY;  Surgeon: Marvene Staff, MD;  Location: [place] [facility];  Service: Gynecology;  Laterality: N/A;  . Unilateral salpingectomy Right 05/02/2013    Procedure: UNILATERAL SALPINGECTOMY;  Surgeon: Marvene Staff, MD;  Location: [place] [facility];  Service: Gynecology;  Laterality: Right;  . Transanal hemorrhoidal dearterialization N/A 05/31/2013    Procedure: [place] HEMORRHOIDAL LIGATION/PEXY;  Surgeon: Adin Hector, MD;  Location: [facility];  Service: General;  Laterality: N/A;  . Evaluation under anesthesia with hemorrhoidectomy N/A 05/31/2013    Procedure: EXAM UNDER ANESTHESIA WITH EXTERNAL HEMORRHOIDECTOMY;  Surgeon: Adin Hector, MD;  Location: [facility];  Service: General;  Laterality: N/A;   Family History  Problem Relation Age of Onset  . Anesthesia problems Neg Hx   . Hypotension Neg Hx   . Malignant hyperthermia Neg Hx   . Pseudochol deficiency Neg Hx   . Diabetes Mother   . Hyperlipidemia Father   . Diabetes Maternal Grandmother   . Cancer Maternal Grandfather   . Hyperlipidemia Paternal Grandfather    Social History  Substance Use Topics  . Smoking status: Never Smoker   . Smokeless tobacco: Never Used  . Alcohol Use: No   OB History    Gravida Para Term Preterm AB  TAB SAB Ectopic Multiple Living   3 1 0 1 2 1 1 0 0 1      Review of Systems  Constitutional: Negative for fever.  Respiratory: Negative for cough and shortness of breath.   Cardiovascular: Positive for leg swelling. Negative for chest pain.  Gastrointestinal: Negative for nausea, vomiting and abdominal pain.  Musculoskeletal: Positive for arthralgias and gait problem.  Skin: Negative for rash and wound.      Allergies  Latex and Keflex  Home Medications   Prior to Admission medications   Medication Sig Start Date End Date Taking? Authorizing Provider  ALPRAZolam Duanne Moron) 1 MG tablet TK 1 T PO TID PRN 05/22/15   Historical Provider, MD  cyclobenzaprine (FLEXERIL) 10 MG tablet  Take one tablet by mouth three (3) times a day as needed for muscle spasms. 06/25/15   Dene Gentry, MD  divalproex (DEPAKOTE ER) 500 MG 24 hr tablet Take 500 mg by mouth 2 (two) times daily.     Historical Provider, MD  fluticasone (FLONASE) 50 MCG/ACT nasal spray Place 1 spray into the nose daily as needed for allergies.  11/18/12   Historical Provider, MD  ibuprofen (ADVIL,MOTRIN) 600 MG tablet Take 1 tablet (600 mg total) by mouth every 6 (six) hours as needed. 02/24/16   Gareth Morgan, MD  methocarbamol (ROBAXIN) 500 MG tablet Take 1 tablet (500 mg total) by mouth every 8 (eight) hours as needed for muscle spasms. 05/26/15   Dene Gentry, MD  vitamin C (ASCORBIC ACID) 500 MG tablet Take 500 mg by mouth daily.    Historical Provider, MD   BP 129/69 mmHg  Pulse 71  Temp(Src) 98.8 F (37.1 C) (Oral)  Resp 18  Ht 5\' 4"  (1.626 m)  Wt 245 lb (111.131 kg)  BMI 42.03 kg/m2  SpO2 100%  LMP 04/25/2013 Physical Exam  Constitutional: She is oriented to person, place, and time. She appears well-developed and well-nourished. No distress.  HENT:  Head: Normocephalic and atraumatic.  Eyes: Conjunctivae and EOM are normal.  Neck: Normal range of motion.  Cardiovascular: Normal rate, regular rhythm and intact distal pulses.   Pulmonary/Chest: Effort normal. No respiratory distress.  Musculoskeletal: She exhibits no edema.       Right ankle: She exhibits normal range of motion.       Left ankle: She exhibits normal range of motion.       Right lower leg: She exhibits no swelling.       Left lower leg: She exhibits no swelling.       Left foot: There is tenderness (arch of foot and medial midfoot). There is normal range of motion, normal capillary refill, no deformity and no laceration.  Neurological: She is alert and oriented to person, place, and time.  Skin: Skin is warm and dry. No rash noted. Ecchymosis: tenderness over superficial vein in foot, very slight appearance of ecchymosis  surrounding area. She is not diaphoretic. No erythema.  Nevus bottom of left foot dark central, lighter perimeter   Nursing note and vitals reviewed.   ED Course  Procedures (including critical care time) Labs Review Labs Reviewed - No data to display  Imaging Review US Venous Img Lower Unilateral Left  02/24/2016  CLINICAL DATA:  Left calf and foot pain and swelling for 1 week. EXAM: LEFT LOWER EXTREMITY VENOUS DOPPLER ULTRASOUND TECHNIQUE: Gray-scale sonography with graded compression, as well as color Doppler and duplex ultrasound were performed to evaluate the lower extremity deep venous systems from  the level of the common femoral vein and including the common femoral, femoral, profunda femoral, popliteal and calf veins including the posterior tibial, peroneal and gastrocnemius veins when visible. The superficial great saphenous vein was also interrogated. Spectral Doppler was utilized to evaluate flow at rest and with distal augmentation maneuvers in the common femoral, femoral and popliteal veins. COMPARISON:  None. FINDINGS: Contralateral Common Femoral Vein: Respiratory phasicity is normal and symmetric with the symptomatic side. No evidence of thrombus. Normal compressibility. Common Femoral Vein: No evidence of thrombus. Normal compressibility, respiratory phasicity and response to augmentation. Saphenofemoral Junction: No evidence of thrombus. Normal compressibility and flow on color Doppler imaging. Profunda Femoral Vein: No evidence of thrombus. Normal compressibility and flow on color Doppler imaging. Femoral Vein: No evidence of thrombus. Normal compressibility, respiratory phasicity and response to augmentation. Popliteal Vein: No evidence of thrombus. Normal compressibility, respiratory phasicity and response to augmentation. Calf Veins: No evidence of thrombus. Normal compressibility and flow on color Doppler imaging. Superficial Great Saphenous Vein: No evidence of thrombus. Normal  compressibility and flow on color Doppler imaging. Venous Reflux:  None. Other Findings:  None. IMPRESSION: Normal examination.  No evidence of deep venous thrombosis. Electronically Signed   By: Claudie Revering M.D.   On: 02/24/2016 16:04   Dg Foot Complete Left  02/24/2016  CLINICAL DATA:  Swelling about the mid aspect of the left foot on the medial side for 1 week. Redness. No known injury. EXAM: LEFT FOOT - COMPLETE 3+ VIEW COMPARISON:  None. FINDINGS: There is no evidence of fracture or dislocation. There is no evidence of arthropathy or other focal bone abnormality. Small calcaneal spurs are identified. Soft tissues are unremarkable. IMPRESSION: No acute abnormality. Electronically Signed   By: Inge Rise M.D.   On: 02/24/2016 14:59   I have personally reviewed and evaluated these images and lab results as part of my medical decision-making.   EKG Interpretation None      MDM   Final diagnoses:  Left leg pain  Left foot pain  Contusion   35yo female with history of seizures, gestational DM resolved, presents with concern for left foot and leg pain. XR shows no sign of stress fx. [place] of LLE shows no DVT.  Pt with good pulses and perfusion, doubt arterial occlusion. No sign of cellulitis or abscess.  Hx not classic for plantar fasciitis.  Tenderness may indicate contusion or other etiology.  Given nevus present on bottom of foot, referred to dermatology and given pain and tenderness of foot will refer to podiatry for further evaluation. Also recommend PCP follow up. Will give crutches for comfort, recommend ice/elevation, NSAIDs.  Gareth Morgan, MD 02/25/16 1338  Gareth Morgan, MD 02/25/16 1339

## 2016-02-24 NOTE — ED Notes (Signed)
Pt c/o L leg pain from mid calf to arch of foot, onset 1 week ago, no injury. Swelling noted to arch of L foot. Denies recent long car or airplane ride.

## 2016-04-13 ENCOUNTER — Encounter: Payer: Self-pay | Admitting: Family Medicine

## 2016-04-13 ENCOUNTER — Ambulatory Visit (INDEPENDENT_AMBULATORY_CARE_PROVIDER_SITE_OTHER): Payer: BLUE CROSS/BLUE SHIELD | Admitting: Family Medicine

## 2016-04-13 VITALS — BP 108/72 | HR 82 | Ht 64.0 in | Wt 249.0 lb

## 2016-04-13 DIAGNOSIS — M5441 Lumbago with sciatica, right side: Secondary | ICD-10-CM

## 2016-04-13 MED ORDER — METHOCARBAMOL 500 MG PO TABS: 500.0000 mg | ORAL_TABLET | Freq: Three times a day (TID) | ORAL | 1 refills | Status: DC | PRN

## 2016-04-13 MED ORDER — HYDROCODONE-ACETAMINOPHEN 7.5-325 MG PO TABS: 1.0000 | ORAL_TABLET | Freq: Four times a day (QID) | ORAL | 0 refills | Status: DC | PRN

## 2016-04-13 MED ORDER — PREDNISONE 10 MG PO TABS: ORAL_TABLET | ORAL | 0 refills | Status: DC

## 2016-04-13 MED FILL — METHOCARBAMOL 500 MG TABLET: 500 | 20 days supply | Qty: 60 | Fill #0

## 2016-04-13 MED FILL — predniSONE 10 MG TABS: 10 | 6 days supply | Qty: 21 | Fill #0

## 2016-04-13 NOTE — Patient Instructions (Addendum)
You have lumbar radiculopathy (a pinched nerve in your low back). A prednisone dose pack is the best option for immediate relief and may be prescribed. Day after finishing prednisone start aleve 2 tabs twice a day with food for pain and inflammation. Norco as needed for severe pain. Robaxin as needed for muscle spasms (no driving on this medicine if it makes you sleepy). Stay as active as possible. Physical therapy has been shown to be helpful as well. Strengthening of low back muscles, abdominal musculature are key for long term pain relief. We will repeat your MRI also.

## 2016-04-15 NOTE — Progress Notes (Addendum)
Patient ID: Abigail Hall, female   DOB: May 17, 1981, 35 y.o.   MRN: [id number]  PCP: No primary care provider on file.  Subjective:   HPI: Patient is a 35 y.o. female here for low back pain.  7/17: Patient reports she has had low back pain since Monday. Pain mostly radiates down into right thigh. Difficulty staying in one position. Tried pain medicine, ibuprofen. No left sided issues. Pain is constant. Slight numbness into right leg also. No bowel/bladder dysfunction. Pain level 9/10.  8/17: Patient reports mild improvement since last visit. Right leg feels better. Able to walk better also. Hard to stay in same position for a long time. Using muscle relaxant, aleve, pain medication as needed. No bowel/bladder dysfunction.  9/14: Patient reports she feels 40% improved from last visit. Doing physical therapy but only a couple visits to date. Some radiation into right leg still. Back is tight. Has done well with 2 ESIs also. No bowel/bladder dysfunction. Flexeril makes her feel sick - no longer taking.  06/07/14: Patient reports she is doing better. Currently has no pain. Doing physical therapy, home exercises, and working out at the gym. Right leg much better as well.  05/26/15: Patient returns with persistent right sided low back pain. Radiation into right leg. Pain level 8/10, sharp up to 10/10 at times. No bowel/bladder dysfunction. Worse with all motions, prolonged sitting. Also reporting sharp pain in left hand also to 10/10 level from forearm into fingers. Right handed. Worse with wrist motions. Has a wrist brace for right hand only.  11/9: Patient reports she's still having low back pain at 8/10 level mainly on left side now more than the right. Had [facility] 10/21 which made pain less harsh but it's still sharp. Worse with all motions. No bowel/bladder dysfunction. No skin changes, fever, other complaints.  04/13/16: Patient returns with 3 weeks of right  sided low back pain radiating to her feet. Pain is 10/10, worse with walking, sharp. Difficulty getting comfortable. Right foot is numb. Tried muscle relaxant, tylenol pm. Difficulty sleeping, prolonged sitting also. No skin changes. No bowel/bladder dysfunction.  Past Medical History:  Diagnosis Date  . Anemia   . Anemia, iron deficiency 04/10/2012  . Constipation   . Depression 04/26/13   history - no current problems  . Diabetes mellitus    gestational DM 2013 pregnancy-resolved  . Generalized headaches   . Hemorrhoids   . History of blood transfusion 02/2013   [facility]  2 units transfused   . Nausea & vomiting    resolved after delivery - C/S, extreme  . Rectal bleeding   . Rectal pain   . Seasonal allergies   . Seizures ([place])    last seizure was  08/2012  . Thalassemia   . Weakness    resolved after C/S delivery    Current Outpatient Prescriptions on File Prior to Visit  Medication Sig Dispense Refill  . ALPRAZolam (XANAX) 1 MG tablet TK 1 T PO TID PRN  2  . divalproex (DEPAKOTE ER) 500 MG 24 hr tablet Take 500 mg by mouth 2 (two) times daily.     . fluticasone (FLONASE) 50 MCG/ACT nasal spray Place 1 spray into the nose daily as needed for allergies.     . vitamin C (ASCORBIC ACID) 500 MG tablet Take 500 mg by mouth daily.     No current facility-administered medications on file prior to visit.     Past Surgical History:  Procedure Laterality Date  . ABDOMINAL  HYSTERECTOMY    . CARPAL TUNNEL RELEASE  01/2009   right  . CESAREAN SECTION  12/31/2011   Procedure: CESAREAN SECTION;  Surgeon: Jolayne Haines, MD;  Location: [facility];  Service: Gynecology;  Laterality: N/A;  . DILATION AND CURETTAGE OF UTERUS     endometrial ablation  . EVALUATION UNDER ANESTHESIA WITH HEMORRHOIDECTOMY N/A 05/31/2013   Procedure: EXAM UNDER ANESTHESIA WITH EXTERNAL HEMORRHOIDECTOMY;  Surgeon: Adin Hector, MD;  Location: [facility];  Service: General;  Laterality: N/A;  . LABIOPLASTY   03/27/2012   Procedure: LABIAPLASTY;  Surgeon: Jolayne Haines, MD;  Location: [place] [facility];  Service: Gynecology;  Laterality: N/A;  labia  . LAPAROSCOPIC TUBAL LIGATION  03/27/2012   Procedure: LAPAROSCOPIC TUBAL LIGATION;  Surgeon: Jolayne Haines, MD;  Location: [place] [facility];  Service: Gynecology;  Laterality: Bilateral;  fallopian tubes caurtery  . LAPAROSCOPY     removal of cyst  . ROBOTIC ASSISTED TOTAL HYSTERECTOMY N/A 05/02/2013   Procedure: ROBOTIC ASSISTED TOTAL HYSTERECTOMY;  Surgeon: Marvene Staff, MD;  Location: [place] [facility];  Service: Gynecology;  Laterality: N/A;  . TONGUE SURGERY    . TRANSANAL HEMORRHOIDAL DEARTERIALIZATION N/A 05/31/2013   Procedure: [place] HEMORRHOIDAL LIGATION/PEXY;  Surgeon: Adin Hector, MD;  Location: [facility];  Service: General;  Laterality: N/A;  . TUBAL LIGATION    . UNILATERAL SALPINGECTOMY Right 05/02/2013   Procedure: UNILATERAL SALPINGECTOMY;  Surgeon: Marvene Staff, MD;  Location: [place] [facility];  Service: Gynecology;  Laterality: Right;  . WISDOM TOOTH EXTRACTION      Allergies  Allergen Reactions  . Latex Itching, Swelling and Rash    Where touched.  . Keflex [Cephalexin] Hives    Social History   Social History  . Marital status: Single    Spouse name: N/A  . Number of children: N/A  . Years of education: N/A   Occupational History  . Not on file.   Social History Main Topics  . Smoking status: Never Smoker  . Smokeless tobacco: Never Used  . Alcohol use No  . Drug use: No  . Sexual activity: Not on file   Other Topics Concern  . Not on file   Social History Narrative  . No narrative on file    Family History  Problem Relation Age of Onset  . Anesthesia problems Neg Hx   . Hypotension Neg Hx   . Malignant hyperthermia Neg Hx   . Pseudochol deficiency Neg Hx   . Diabetes Mother   . Hyperlipidemia Father   . Diabetes Maternal Grandmother   . Cancer Maternal Grandfather   . Hyperlipidemia Paternal Grandfather     BP 108/72    Pulse 82   Ht 5\' 4"  (1.626 m)   Wt 249 lb (112.9 kg)   LMP 04/25/2013   BMI 42.74 kg/m   Review of Systems: See HPI above.    Objective:  Physical Exam:  Gen: NAD  Back: No gross deformity, scoliosis. Mild TTP bilateral lumbar paraspinal regions.  No midline or bony TTP. FROM with pain on flexion. Strength LEs 5/5 all muscle groups.   2+ MSRs in patellar and achilles tendons, equal bilaterally. Negative SLRs. Sensation intact to light touch bilaterally.  Bilateral hips: Negative logroll bilateral hips Negative fabers, piriformis stretches.    Assessment & Plan:  1. Low back pain - consistent with lumbar radiculopathy likely from the L4-5 bulge on MRI.  She has had 3 ESIs, done physical therapy in the past.  Has done well  over past several months.  Advised we go ahead with an updated MRI, will start prednisone dose pack with norco and robaxin as needed.    Addendum:  MRI reviewed and discussed with patient - no changes noted from MRI 2 years ago.  Will go ahead with ESIs bilaterally at L4-5, patient to call us a week later to update [place] on her status.

## 2016-04-15 NOTE — Assessment & Plan Note (Signed)
consistent with lumbar radiculopathy likely from the L4-5 bulge on MRI.  She has had 3 ESIs, done physical therapy in the past.  Has done well over past several months.  Advised we go ahead with an updated MRI, will start prednisone dose pack with norco and robaxin as needed.

## 2016-04-21 ENCOUNTER — Telehealth: Payer: Self-pay | Admitting: *Deleted

## 2016-04-21 NOTE — Telephone Encounter (Signed)
She is already taking a strong pain medication and muscle relaxant.  She could add something topical like capsaicin and an anti-inflamatory like aleve OR ibuprofen.

## 2016-04-21 NOTE — Telephone Encounter (Signed)
Patient called and said she has finished taking the steroids and foot is still numb and having pain. The pain medication is not helping much. Has MRI on 04-24-16. Is there anything she can to do before MRI to help with the pain.

## 2016-04-22 NOTE — Telephone Encounter (Signed)
Spoke to patient and she stated that she wanted to do the MRI earlier. Got patient an earlier appointment for MRI.

## 2016-04-23 ENCOUNTER — Ambulatory Visit (HOSPITAL_COMMUNITY)
Admission: RE | Admit: 2016-04-23 | Discharge: 2016-04-23 | Disposition: A | Discharge status: Home / Self Care | Payer: BLUE CROSS/BLUE SHIELD | Source: Ambulatory Visit | Attending: Family Medicine | Admitting: Family Medicine

## 2016-04-23 ENCOUNTER — Encounter (HOSPITAL_COMMUNITY): Payer: Self-pay | Admitting: Radiology

## 2016-04-23 DIAGNOSIS — M47816 Spondylosis without myelopathy or radiculopathy, lumbar region: Secondary | ICD-10-CM | POA: Diagnosis not present

## 2016-04-23 DIAGNOSIS — M5126 Other intervertebral disc displacement, lumbar region: Secondary | ICD-10-CM | POA: Insufficient documentation

## 2016-04-23 DIAGNOSIS — M5441 Lumbago with sciatica, right side: Secondary | ICD-10-CM | POA: Diagnosis present

## 2016-04-24 ENCOUNTER — Other Ambulatory Visit (HOSPITAL_BASED_OUTPATIENT_CLINIC_OR_DEPARTMENT_OTHER): Payer: BLUE CROSS/BLUE SHIELD

## 2016-04-28 ENCOUNTER — Other Ambulatory Visit: Payer: Self-pay | Admitting: Family Medicine

## 2016-04-28 DIAGNOSIS — M5416 Radiculopathy, lumbar region: Secondary | ICD-10-CM

## 2016-05-04 ENCOUNTER — Other Ambulatory Visit: Payer: Self-pay | Admitting: Family Medicine

## 2016-05-04 ENCOUNTER — Ambulatory Visit
Admission: RE | Admit: 2016-05-04 | Discharge: 2016-05-04 | Disposition: A | Discharge status: Home / Self Care | Payer: BLUE CROSS/BLUE SHIELD | Source: Ambulatory Visit | Attending: Family Medicine | Admitting: Family Medicine

## 2016-05-04 DIAGNOSIS — M5416 Radiculopathy, lumbar region: Secondary | ICD-10-CM

## 2016-05-04 MED ORDER — METHYLPREDNISOLONE ACETATE 40 MG/ML INJ SUSP (RADIOLOG
120.0000 mg | Freq: Once | INTRAMUSCULAR | Status: AC
  Administered 2016-05-04: 120 mg via EPIDURAL

## 2016-05-04 MED ORDER — IOPAMIDOL (ISOVUE-M 200) INJECTION 41%
1.0000 mL | Freq: Once | INTRAMUSCULAR | Status: AC
  Administered 2016-05-04: 1 mL via EPIDURAL

## 2016-05-04 NOTE — Discharge Instructions (Signed)

## 2016-05-10 ENCOUNTER — Telehealth: Payer: Self-pay | Admitting: *Deleted

## 2016-05-10 NOTE — Telephone Encounter (Signed)
Patient called and left a voice mail on 9-22-17stating that she had the [facility] on Tuesday (9-19). Stated that her foot still feels numb and also wanted to know if she could get a refill on the pain medication. Called patient and she stated that her right foot is still numb and still having pain as of today (05-10-16).

## 2016-05-10 NOTE — Telephone Encounter (Signed)
We can't refill the hydrocodone.  There are a few things she could do: physical therapy, nerve conduction studies to see if there's something going on outside of the spinal cord (because her symptoms shouldn't be this severe given the MRI findings are mild - would expect the shot to have helped her more than it has), neurosurgeon referral but i'm confident they would recommend against surgery because the MRI findings are mild.

## 2016-05-10 NOTE — Telephone Encounter (Signed)
Spoke to patient and gave her the choices that were provided by the physician. Said she would have to think about them and would let us know.

## 2016-05-13 ENCOUNTER — Ambulatory Visit (INDEPENDENT_AMBULATORY_CARE_PROVIDER_SITE_OTHER): Payer: BLUE CROSS/BLUE SHIELD | Admitting: Family Medicine

## 2016-05-13 ENCOUNTER — Encounter: Payer: Self-pay | Admitting: Family Medicine

## 2016-05-13 DIAGNOSIS — M5441 Lumbago with sciatica, right side: Secondary | ICD-10-CM

## 2016-05-13 NOTE — Patient Instructions (Signed)
We will arrange for a second ESI on the right side only this time. We will also refer you to neurology for further evaluation, likely they will do nerve conduction studies. Follow up will depend on how those two things go

## 2016-05-14 NOTE — Progress Notes (Signed)
Patient ID: Abigail Hall, female   DOB: 11/10/1980, 35 y.o.   MRN: [id number]  PCP: No primary care provider on file.  Subjective:   HPI: Patient is a 35 y.o. female here for low back pain.  7/17: Patient reports she has had low back pain since Monday. Pain mostly radiates down into right thigh. Difficulty staying in one position. Tried pain medicine, ibuprofen. No left sided issues. Pain is constant. Slight numbness into right leg also. No bowel/bladder dysfunction. Pain level 9/10.  8/17: Patient reports mild improvement since last visit. Right leg feels better. Able to walk better also. Hard to stay in same position for a long time. Using muscle relaxant, aleve, pain medication as needed. No bowel/bladder dysfunction.  9/14: Patient reports she feels 40% improved from last visit. Doing physical therapy but only a couple visits to date. Some radiation into right leg still. Back is tight. Has done well with 2 ESIs also. No bowel/bladder dysfunction. Flexeril makes her feel sick - no longer taking.  06/07/14: Patient reports she is doing better. Currently has no pain. Doing physical therapy, home exercises, and working out at the gym. Right leg much better as well.  05/26/15: Patient returns with persistent right sided low back pain. Radiation into right leg. Pain level 8/10, sharp up to 10/10 at times. No bowel/bladder dysfunction. Worse with all motions, prolonged sitting. Also reporting sharp pain in left hand also to 10/10 level from forearm into fingers. Right handed. Worse with wrist motions. Has a wrist brace for right hand only.  11/9: Patient reports she's still having low back pain at 8/10 level mainly on left side now more than the right. Had [facility] 10/21 which made pain less harsh but it's still sharp. Worse with all motions. No bowel/bladder dysfunction. No skin changes, fever, other complaints.  04/13/16: Patient returns with 3 weeks of right  sided low back pain radiating to her feet. Pain is 10/10, worse with walking, sharp. Difficulty getting comfortable. Right foot is numb. Tried muscle relaxant, tylenol pm. Difficulty sleeping, prolonged sitting also. No skin changes. No bowel/bladder dysfunction.  9/28: Patient returns with 9/10 low back pain radiating to right leg. Pain is sharp, constant. Leg will feel tight at times. Numbness is also into right foot in 1st-3rd toes. Last ESI was 9/19 but not as much relief this time. No bowel/bladder dysfunction.  Past Medical History:  Diagnosis Date  . Anemia   . Anemia, iron deficiency 04/10/2012  . Constipation   . Depression 04/26/13   history - no current problems  . Diabetes mellitus    gestational DM 2013 pregnancy-resolved  . Generalized headaches   . Hemorrhoids   . History of blood transfusion 02/2013   [facility]  2 units transfused   . Nausea & vomiting    resolved after delivery - C/S, extreme  . Rectal bleeding   . Rectal pain   . Seasonal allergies   . Seizures ([place])    last seizure was  08/2012  . Thalassemia   . Weakness    resolved after C/S delivery    Current Outpatient Prescriptions on File Prior to Visit  Medication Sig Dispense Refill  . ALPRAZolam (XANAX) 1 MG tablet TK 1 T PO TID PRN  2  . divalproex (DEPAKOTE ER) 500 MG 24 hr tablet Take 500 mg by mouth 2 (two) times daily.     . fluticasone (FLONASE) 50 MCG/ACT nasal spray Place 1 spray into the nose daily as needed for  allergies.     Marland Kitchen HYDROcodone-acetaminophen (NORCO) 7.5-325 MG tablet Take 1 tablet by mouth every 6 (six) hours as needed for moderate pain. 40 tablet 0  . methocarbamol (ROBAXIN) 500 MG tablet Take 1 tablet (500 mg total) by mouth every 8 (eight) hours as needed for muscle spasms. 60 tablet 1  . predniSONE (DELTASONE) 10 MG tablet 6 tabs po day 1, 5 tabs po day 2, 4 tabs po day 3, 3 tabs po day 4, 2 tabs po day 5, 1 tab po day 6 21 tablet 0  . vitamin C (ASCORBIC ACID)  500 MG tablet Take 500 mg by mouth daily.     No current facility-administered medications on file prior to visit.     Past Surgical History:  Procedure Laterality Date  . ABDOMINAL HYSTERECTOMY    . CARPAL TUNNEL RELEASE  01/2009   right  . CESAREAN SECTION  12/31/2011   Procedure: CESAREAN SECTION;  Surgeon: Jolayne Haines, MD;  Location: [place] [facility];  Service: Gynecology;  Laterality: N/A;  . DILATION AND CURETTAGE OF UTERUS     endometrial ablation  . EVALUATION UNDER ANESTHESIA WITH HEMORRHOIDECTOMY N/A 05/31/2013   Procedure: EXAM UNDER ANESTHESIA WITH EXTERNAL HEMORRHOIDECTOMY;  Surgeon: Adin Hector, MD;  Location: [facility];  Service: General;  Laterality: N/A;  . LABIOPLASTY  03/27/2012   Procedure: LABIAPLASTY;  Surgeon: Jolayne Haines, MD;  Location: [place] [facility];  Service: Gynecology;  Laterality: N/A;  labia  . LAPAROSCOPIC TUBAL LIGATION  03/27/2012   Procedure: LAPAROSCOPIC TUBAL LIGATION;  Surgeon: Jolayne Haines, MD;  Location: [place] [facility];  Service: Gynecology;  Laterality: Bilateral;  fallopian tubes caurtery  . LAPAROSCOPY     removal of cyst  . ROBOTIC ASSISTED TOTAL HYSTERECTOMY N/A 05/02/2013   Procedure: ROBOTIC ASSISTED TOTAL HYSTERECTOMY;  Surgeon: Marvene Staff, MD;  Location: [place] [facility];  Service: Gynecology;  Laterality: N/A;  . TONGUE SURGERY    . TRANSANAL HEMORRHOIDAL DEARTERIALIZATION N/A 05/31/2013   Procedure: [place] HEMORRHOIDAL LIGATION/PEXY;  Surgeon: Adin Hector, MD;  Location: [facility];  Service: General;  Laterality: N/A;  . TUBAL LIGATION    . UNILATERAL SALPINGECTOMY Right 05/02/2013   Procedure: UNILATERAL SALPINGECTOMY;  Surgeon: Marvene Staff, MD;  Location: [place] [facility];  Service: Gynecology;  Laterality: Right;  . WISDOM TOOTH EXTRACTION      Allergies  Allergen Reactions  . Latex Itching, Swelling and Rash    Where touched.  . Keflex [Cephalexin] Hives    Social History   Social History  . Marital status: Single    Spouse name: N/A  . Number  of children: N/A  . Years of education: N/A   Occupational History  . Not on file.   Social History Main Topics  . Smoking status: Never Smoker  . Smokeless tobacco: Never Used  . Alcohol use No  . Drug use: No  . Sexual activity: Not on file   Other Topics Concern  . Not on file   Social History Narrative  . No narrative on file    Family History  Problem Relation Age of Onset  . Anesthesia problems Neg Hx   . Hypotension Neg Hx   . Malignant hyperthermia Neg Hx   . Pseudochol deficiency Neg Hx   . Diabetes Mother   . Hyperlipidemia Father   . Diabetes Maternal Grandmother   . Cancer Maternal Grandfather   . Hyperlipidemia Paternal Grandfather     BP 126/79   Pulse 78  Ht 5\' 4"  (1.626 m)   Wt 240 lb (108.9 kg)   LMP 04/25/2013   BMI 41.20 kg/m   Review of Systems: See HPI above.    Objective:  Physical Exam:  Gen: NAD  Back: No gross deformity, scoliosis. Mild TTP bilateral lumbar paraspinal regions.  No midline or bony TTP. FROM with pain on flexion. Strength LEs 5/5 all muscle groups.   2+ MSRs in patellar and achilles tendons, equal bilaterally. Negative SLRs.  Bilateral hips: Negative logroll bilateral hips Negative fabers, piriformis stretches.    Assessment & Plan:  1. Low back pain - consistent with lumbar radiculopathy likely from the L4-5 bulge on MRI.  She did not respond as well this time to [facility] and we discussed bulge at L4-5 is mild, I would expect more improvement than this.  She has previously done PT.  I think at this point we refer to neurology for evaluation and likely NCV/EMGs.  We will set up a second ESI as well though advised these are done in 2 week intervals for a max of 3.  Robaxin, tylenol if needed.

## 2016-05-14 NOTE — Assessment & Plan Note (Signed)
consistent with lumbar radiculopathy likely from the L4-5 bulge on MRI.  She did not respond as well this time to [facility] and we discussed bulge at L4-5 is mild, I would expect more improvement than this.  She has previously done PT.  I think at this point we refer to neurology for evaluation and likely NCV/EMGs.  We will set up a second ESI as well though advised these are done in 2 week intervals for a max of 3.  Robaxin, tylenol if needed.

## 2016-05-18 NOTE — Addendum Note (Signed)
Addended by: Sherrie George F on: 05/18/2016 08:12 AM   Modules accepted: Orders

## 2016-05-19 ENCOUNTER — Other Ambulatory Visit: Payer: Self-pay | Admitting: Family Medicine

## 2016-05-19 DIAGNOSIS — M545 Low back pain: Principal | ICD-10-CM

## 2016-05-19 DIAGNOSIS — G8929 Other chronic pain: Secondary | ICD-10-CM

## 2016-05-21 ENCOUNTER — Other Ambulatory Visit: Payer: Self-pay | Admitting: Gynecology

## 2016-05-21 DIAGNOSIS — D563 Thalassemia minor: Secondary | ICD-10-CM

## 2016-05-31 ENCOUNTER — Ambulatory Visit
Admission: RE | Admit: 2016-05-31 | Discharge: 2016-05-31 | Disposition: A | Discharge status: Home / Self Care | Payer: BLUE CROSS/BLUE SHIELD | Source: Ambulatory Visit | Attending: Family Medicine | Admitting: Family Medicine

## 2016-05-31 DIAGNOSIS — M545 Low back pain: Principal | ICD-10-CM

## 2016-05-31 DIAGNOSIS — G8929 Other chronic pain: Secondary | ICD-10-CM

## 2016-05-31 MED ORDER — METHYLPREDNISOLONE ACETATE 40 MG/ML INJ SUSP (RADIOLOG
120.0000 mg | Freq: Once | INTRAMUSCULAR | Status: AC
  Administered 2016-05-31: 120 mg via EPIDURAL

## 2016-05-31 MED ORDER — IOPAMIDOL (ISOVUE-M 200) INJECTION 41%
1.0000 mL | Freq: Once | INTRAMUSCULAR | Status: AC
  Administered 2016-05-31: 1 mL via EPIDURAL

## 2016-05-31 NOTE — Discharge Instructions (Signed)

## 2016-06-02 ENCOUNTER — Other Ambulatory Visit: Payer: BLUE CROSS/BLUE SHIELD

## 2016-06-02 ENCOUNTER — Other Ambulatory Visit: Payer: Self-pay | Admitting: Surgical Oncology

## 2016-06-02 ENCOUNTER — Other Ambulatory Visit: Payer: Self-pay | Admitting: Gynecology

## 2016-06-02 ENCOUNTER — Ambulatory Visit
Admission: RE | Admit: 2016-06-02 | Discharge: 2016-06-02 | Disposition: A | Discharge status: Home / Self Care | Payer: BLUE CROSS/BLUE SHIELD | Source: Ambulatory Visit | Attending: Gynecology | Admitting: Gynecology

## 2016-06-02 DIAGNOSIS — D563 Thalassemia minor: Secondary | ICD-10-CM

## 2016-06-28 ENCOUNTER — Encounter: Payer: Self-pay | Admitting: Family Medicine

## 2016-06-28 NOTE — Progress Notes (Signed)
We received fax from [facility] for patient on 06/16/16, stating it was a Disability Claim Form.   Staff called patient to ask about this - patient reported it was a form to help cover her bills since she has been out of work because of her back pain dating back to August 21st and that she plans to return on December 4th.  We asked patient who took her out of work as we did not write a letter or fill out disability paperwork that she was totally disabled and could not work.  I also told her this would not be typical for her medical condition.  [employer] requested office notes from [place] only.  She stated she told me that she couldn't work because of her back but no physician wrote her out of work.    We advised patient we cannot fill out paperwork unless we took her out of work.  At last office visit she was referred to neurology - neurology reported they called the patient 3 times and left messages, she never returned their calls.  Patient stated they had the wrong phone number.  Patient did not contact us to follow up on why she had not heard from neurology.

## 2016-07-12 ENCOUNTER — Ambulatory Visit (INDEPENDENT_AMBULATORY_CARE_PROVIDER_SITE_OTHER): Payer: BLUE CROSS/BLUE SHIELD | Admitting: Neurology

## 2016-07-12 ENCOUNTER — Encounter: Payer: Self-pay | Admitting: Neurology

## 2016-07-12 VITALS — BP 120/67 | HR 89 | Ht 64.0 in | Wt 261.8 lb

## 2016-07-12 DIAGNOSIS — M5417 Radiculopathy, lumbosacral region: Secondary | ICD-10-CM | POA: Diagnosis not present

## 2016-07-12 MED ORDER — GABAPENTIN 300 MG PO CAPS: 300.0000 mg | ORAL_CAPSULE | Freq: Three times a day (TID) | ORAL | 11 refills | Status: DC

## 2016-07-12 MED FILL — GABAPENTIN 300 MG CAPSULE: 300 | 30 days supply | Qty: 90 | Fill #0

## 2016-07-12 NOTE — Progress Notes (Signed)
[id number]    Provider:  Dr Jaynee Eagles Referring Provider:  Karlton Lemon MD  Primary Care Physician:  Karlton Lemon MD   CC:  Lumbar radiculopathy.  HPI:  Abigail Hall is a 35 y.o. female here as a referral from Dr. Karlton Lemon MD  for right leg pain and radiculopathy. Ongoing since 2015 no inciting events or trauma. She has had ESI which initially helped a little but not working anymore. She is having difficulty with weight gain since she cannot work out  especially on Depakote for her seizure disorder. She can't work out because of the right leg weakness and the pain. She has leg pain with radicular symptoms into the right leg with numbness of the foot. Foot numbness since August. The pain starts in the upper right leg and radiates down the side and back of the thigh and to the lateral lower leg and into the foot. Her leg gives out, weak. Most painful when in a position for a long time such as sitting in one position for a long time. At one point there was so much pain she couldn't even sit. Muscle relaxers don't help, hydrocodone help a little bit but not on it now and doesn't want to take it , hard to sleep because she wakes with pain. Her right foot is numb in the toes and in the lateral aspect of the foot. She can' t feel the bottom of her heel, continuously numb, she can;t drive for long periods. She gets cramps in the right foot. No changes in bowel or bladder.   Reviewed notes, labs and imaging from outside physicians, which showed:  BUN 8, creatinine 0.65 05/2014, TSH 1.552 10/2011  MRI lumbar spine 04/2016: personally reviewed images and agree with the following  Disc levels:  L1-2:  Negative.  L2-3: Normal interspace. No disc bulge or disc protrusion. Left-sided facet arthrosis. No canal or foraminal stenosis.  L3-4:  Negative.  L4-5: Mild diffuse degenerative disc bulge with disc desiccation, stable. No focal disc herniation. Mild bilateral  facet arthrosis, right worse than left. No canal stenosis. Mild bilateral foraminal narrowing, stable.  L5-S1:  Negative. Aic 6 IMPRESSION: 1. Shallow broad-based disc protrusion at L4-5 with resultant mild bilateral foraminal narrowing, stable from prior. No other significant degenerative disc disease. No new focal disc herniation. 2. Mild facet arthrosis at L2-3 and L4-5 as above.  Review of Systems: Patient complains of symptoms per HPI as well as the following symptoms: swelling in legs, anemia, joint pain, cramps, aching muscles, headache, numbness, weakness, restless legs. Pertinent negatives per HPI. All others negative.   Social History   Social History  . Marital status: Single    Spouse name: N/A  . Number of children: 1  . Years of education: Some College   Occupational History  . [employer]    Social History Main Topics  . Smoking status: Never Smoker  . Smokeless tobacco: Never Used  . Alcohol use 0.0 oz/week     Comment: Once a month  . Drug use: No  . Sexual activity: Not on file   Other Topics Concern  . Not on file   Social History Narrative   Lives with herself and son   Caffeine use: none    Family History  Problem Relation Age of Onset  . Diabetes Mother   . Hyperlipidemia Father   . Cancer Maternal Grandfather   . Hyperlipidemia Paternal Grandfather   . Diabetes Maternal Grandmother   . Anesthesia problems  Neg Hx   . Hypotension Neg Hx   . Malignant hyperthermia Neg Hx   . Pseudochol deficiency Neg Hx     Past Medical History:  Diagnosis Date  . Anemia   . Anemia, iron deficiency 04/10/2012  . Constipation   . Depression 04/26/13   history - no current problems  . Diabetes mellitus    gestational DM 2013 pregnancy-resolved  . Generalized headaches   . Hemorrhoids   . History of blood transfusion 02/2013   [facility]  2 units transfused   . Nausea & vomiting    resolved after delivery - C/S, extreme  . Rectal bleeding   . Rectal  pain   . Seasonal allergies   . Seizures ([place])    last seizure was  08/2012  . Thalassemia   . Weakness    resolved after C/S delivery    Past Surgical History:  Procedure Laterality Date  . ABDOMINAL HYSTERECTOMY    . CARPAL TUNNEL RELEASE  01/2009   right  . CESAREAN SECTION  12/31/2011   Procedure: CESAREAN SECTION;  Surgeon: Jolayne Haines, MD;  Location: [facility];  Service: Gynecology;  Laterality: N/A;  . DILATION AND CURETTAGE OF UTERUS     endometrial ablation  . EVALUATION UNDER ANESTHESIA WITH HEMORRHOIDECTOMY N/A 05/31/2013   Procedure: EXAM UNDER ANESTHESIA WITH EXTERNAL HEMORRHOIDECTOMY;  Surgeon: Adin Hector, MD;  Location: [facility];  Service: General;  Laterality: N/A;  . LABIOPLASTY  03/27/2012   Procedure: LABIAPLASTY;  Surgeon: Jolayne Haines, MD;  Location: [facility];  Service: Gynecology;  Laterality: N/A;  labia  . LAPAROSCOPIC TUBAL LIGATION  03/27/2012   Procedure: LAPAROSCOPIC TUBAL LIGATION;  Surgeon: Jolayne Haines, MD;  Location: [facility];  Service: Gynecology;  Laterality: Bilateral;  fallopian tubes caurtery  . LAPAROSCOPY     removal of cyst  . ROBOTIC ASSISTED TOTAL HYSTERECTOMY N/A 05/02/2013   Procedure: ROBOTIC ASSISTED TOTAL HYSTERECTOMY;  Surgeon: Marvene Staff, MD;  Location: [facility];  Service: Gynecology;  Laterality: N/A;  . TONGUE SURGERY    . TRANSANAL HEMORRHOIDAL DEARTERIALIZATION N/A 05/31/2013   Procedure: [place] HEMORRHOIDAL LIGATION/PEXY;  Surgeon: Adin Hector, MD;  Location: [facility];  Service: General;  Laterality: N/A;  . TUBAL LIGATION    . UNILATERAL SALPINGECTOMY Right 05/02/2013   Procedure: UNILATERAL SALPINGECTOMY;  Surgeon: Marvene Staff, MD;  Location: [facility];  Service: Gynecology;  Laterality: Right;  . WISDOM TOOTH EXTRACTION      Current Outpatient Prescriptions  Medication Sig Dispense Refill  . ALPRAZolam (XANAX) 1 MG tablet TK 1 T PO TID PRN  2  . divalproex (DEPAKOTE ER) 500 MG 24 hr tablet Take 500 mg by mouth 2  (two) times daily.     . fluticasone (FLONASE) 50 MCG/ACT nasal spray Place 1 spray into the nose daily as needed for allergies.     . vitamin C (ASCORBIC ACID) 500 MG tablet Take 500 mg by mouth daily.    Marland Kitchen gabapentin (NEURONTIN) 300 MG capsule Take 1 capsule (300 mg total) by mouth 3 (three) times daily. 90 capsule 11   No current facility-administered medications for this visit.     Allergies as of 07/12/2016 - Review Complete 07/12/2016  Allergen Reaction Noted  . Latex Itching, Swelling, and Rash 05/24/2011  . Keflex [cephalexin] Hives 03/01/2013    Vitals: BP 120/67 (BP Location: Right Arm, Patient Position: Sitting, Cuff Size: Large)   Pulse 89   Ht 5\' 4"  (1.626  m)   Wt 261 lb 12.8 oz (118.8 kg)   LMP 04/25/2013   BMI 44.94 kg/m  Last Weight:  Wt Readings from Last 1 Encounters:  07/12/16 261 lb 12.8 oz (118.8 kg)   Last Height:   Ht Readings from Last 1 Encounters:  07/12/16 5\' 4"  (1.626 m)   Physical exam: Exam: Gen: NAD, conversant, well nourised, obese, well groomed                     CV: RRR, no MRG. No Carotid Bruits. No peripheral edema, warm, nontender Eyes: Conjunctivae clear without exudates or hemorrhage  Neuro: Detailed Neurologic Exam  Speech:    Speech is normal; fluent and spontaneous with normal comprehension.  Cognition:    The patient is oriented to person, place, and time;     recent and remote memory intact;     language fluent;     normal attention, concentration,     fund of knowledge Cranial Nerves:    The pupils are equal, round, and reactive to light. The fundi are normal and spontaneous venous pulsations are present. Visual fields are full to finger confrontation. Extraocular movements are intact. Trigeminal sensation is intact and the muscles of mastication are normal. The face is symmetric. The palate elevates in the midline. Hearing intact. Voice is normal. Shoulder shrug is normal. The tongue has normal motion without  fasciculations.   Coordination:    Normal finger to nose and heel to shin. Normal rapid alternating movements.   Gait:    Heel-toe and tandem gait are normal.   Motor Observation:    No asymmetry, no atrophy, and no involuntary movements noted. Tone:    Normal muscle tone.    Posture:    Posture is normal. normal erect    Strength:   weakness right hip flexion, DF and leg flexion, inversion and eversion otherwise strength is V/V in the upper and lower limbs.      Sensation: decreased in an L5 distribution of the right leg     Reflex Exam:  DTR's: Possibly decreased reflex on the right AJ     Toes:    The toes are downgoing bilaterally.   Clonus:    Clonus is absent.  Assessment/Plan:  35 year old patient with likely L5 radiculopathy. She has  weakness right leg flexion, dorsiflexion and  inversion and eversion of the right foot, she has radicular pain and decreased sensation in an L5 distribution. We can perform an emg/ncs and if necessary a CT myelogram to try and confirm given MRI of the lumbar spine is not conclusive but her symptoms do suggest L5 radic. Will refer for evaluation of surgical options.  Cc: Karlton Lemon MD   Sarina Ill, MD  [facility] [address] [place], [place] [address]  Phone [phone] Fax [phone]

## 2016-07-12 NOTE — Patient Instructions (Addendum)
Remember to drink plenty of fluid, eat healthy meals and do not skip any meals. Try to eat protein with a every meal and eat a healthy snack such as fruit or nuts in between meals. Try to keep a regular sleep-wake schedule and try to exercise daily, particularly in the form of walking, 20-30 minutes a day, if you can.   As far as your medications are concerned, I would like to suggest: Neurontin (gabapentin) 300mg  three times a day  As far as diagnostic testing: Referral to [facility](Dr. Katherine Roan) and Orthopaedics (Dr. Rolena Infante) and emg/ncs late January or February  Please call us with any interim questions, concerns, problems, updates or refill requests.   Our phone number is [phone]. We also have an after hours call service for urgent matters and there is a physician on-call for urgent questions. For any emergencies you know to call 911 or go to the nearest emergency room  Gabapentin capsules or tablets What is this medicine? GABAPENTIN (GA ba pen tin) is used to control partial seizures in adults with epilepsy. It is also used to treat certain types of nerve pain. This medicine may be used for other purposes; ask your health care provider or pharmacist if you have questions. COMMON BRAND NAME(S): Active-PAC with Gabapentin, Gabarone, Neurontin What should I tell my health care provider before I take this medicine? They need to know if you have any of these conditions: -kidney disease -suicidal thoughts, plans, or attempt; a previous suicide attempt by you or a family member -an unusual or allergic reaction to gabapentin, other medicines, foods, dyes, or preservatives -pregnant or trying to get pregnant -breast-feeding How should I use this medicine? Take this medicine by mouth with a glass of water. Follow the directions on the prescription label. You can take it with or without food. If it upsets your stomach, take it with food.Take your medicine at regular intervals. Do not take it more  often than directed. Do not stop taking except on your doctor's advice. If you are directed to break the 600 or 800 mg tablets in half as part of your dose, the extra half tablet should be used for the next dose. If you have not used the extra half tablet within 28 days, it should be thrown away. A special MedGuide will be given to you by the pharmacist with each prescription and refill. Be sure to read this information carefully each time. Talk to your pediatrician regarding the use of this medicine in children. Special care may be needed. Overdosage: If you think you have taken too much of this medicine contact a poison control center or emergency room at once. NOTE: This medicine is only for you. Do not share this medicine with others. What if I miss a dose? If you miss a dose, take it as soon as you can. If it is almost time for your next dose, take only that dose. Do not take double or extra doses. What may interact with this medicine? Do not take this medicine with any of the following medications: -other gabapentin products This medicine may also interact with the following medications: -alcohol -antacids -antihistamines for allergy, cough and cold -certain medicines for anxiety or sleep -certain medicines for depression or psychotic disturbances -homatropine; hydrocodone -naproxen -narcotic medicines (opiates) for pain -phenothiazines like chlorpromazine, mesoridazine, prochlorperazine, thioridazine This list may not describe all possible interactions. Give your health care provider a list of all the medicines, herbs, non-prescription drugs, or dietary supplements you use. Also tell  them if you smoke, drink alcohol, or use illegal drugs. Some items may interact with your medicine. What should I watch for while using this medicine? Visit your doctor or health care professional for regular checks on your progress. You may want to keep a record at home of how you feel your condition is  responding to treatment. You may want to share this information with your doctor or health care professional at each visit. You should contact your doctor or health care professional if your seizures get worse or if you have any new types of seizures. Do not stop taking this medicine or any of your seizure medicines unless instructed by your doctor or health care professional. Stopping your medicine suddenly can increase your seizures or their severity. Wear a medical identification bracelet or chain if you are taking this medicine for seizures, and carry a card that lists all your medications. You may get drowsy, dizzy, or have blurred vision. Do not drive, use machinery, or do anything that needs mental alertness until you know how this medicine affects you. To reduce dizzy or fainting spells, do not sit or stand up quickly, especially if you are an older patient. Alcohol can increase drowsiness and dizziness. Avoid alcoholic drinks. Your mouth may get dry. Chewing sugarless gum or sucking hard candy, and drinking plenty of water will help. The use of this medicine may increase the chance of suicidal thoughts or actions. Pay special attention to how you are responding while on this medicine. Any worsening of mood, or thoughts of suicide or dying should be reported to your health care professional right away. Women who become pregnant while using this medicine may enroll in the [place] Pregnancy Registry by calling [phone]. This registry collects information about the safety of antiepileptic drug use during pregnancy. What side effects may I notice from receiving this medicine? Side effects that you should report to your doctor or health care professional as soon as possible: -allergic reactions like skin rash, itching or hives, swelling of the face, lips, or tongue -worsening of mood, thoughts or actions of suicide or dying Side effects that usually do not require medical  attention (report to your doctor or health care professional if they continue or are bothersome): -constipation -difficulty walking or controlling muscle movements -dizziness -nausea -slurred speech -tiredness -tremors -weight gain This list may not describe all possible side effects. Call your doctor for medical advice about side effects. You may report side effects to FDA at 1-800-FDA-1088. Where should I keep my medicine? Keep out of reach of children. This medicine may cause accidental overdose and death if it taken by other adults, children, or pets. Mix any unused medicine with a substance like cat litter or coffee grounds. Then throw the medicine away in a sealed container like a sealed bag or a coffee can with a lid. Do not use the medicine after the expiration date. Store at room temperature between 15 and 30 degrees C (59 and 86 degrees F). NOTE: This sheet is a summary. It may not cover all possible information. If you have questions about this medicine, talk to your doctor, pharmacist, or health care provider.  2017 Elsevier/Gold Standard (2013-09-28 15:26:50)

## 2016-07-15 ENCOUNTER — Telehealth: Payer: Self-pay | Admitting: *Deleted

## 2016-07-20 DIAGNOSIS — M5416 Radiculopathy, lumbar region: Secondary | ICD-10-CM | POA: Insufficient documentation

## 2016-07-20 DIAGNOSIS — M47816 Spondylosis without myelopathy or radiculopathy, lumbar region: Secondary | ICD-10-CM | POA: Insufficient documentation

## 2016-07-20 DIAGNOSIS — M5136 Other intervertebral disc degeneration, lumbar region: Secondary | ICD-10-CM | POA: Insufficient documentation

## 2016-07-30 ENCOUNTER — Telehealth: Payer: Self-pay | Admitting: Neurology

## 2016-07-30 NOTE — Telephone Encounter (Signed)
Abigail Hall, can u call and ask patient if she wanted to be seen earlier for emg/ncs? I saw she scheduled in February. I also saw her notes from [place] and I see they are ordering a CT myelogram, she may want to wait until that is completed. But if she does want to come in earlier, I can talk to [facility] and we can try to get her in sooner, let me know.

## 2016-08-02 NOTE — Telephone Encounter (Signed)
LVM for pt to call back. Gave [facility] phone number.  

## 2016-08-03 NOTE — Telephone Encounter (Signed)
Called and spoke with patient. Relayed AA,MD message below. She stated she went to hospital yesterday and has CT myelogram completed. She will find out results next Tuesday. She wants to wait and see what that shows first. She will call back once she knows more to possibly come in sooner for EMG.NCS.

## 2016-09-23 ENCOUNTER — Ambulatory Visit (INDEPENDENT_AMBULATORY_CARE_PROVIDER_SITE_OTHER): Payer: Managed Care, Other (non HMO) | Admitting: Neurology

## 2016-09-23 ENCOUNTER — Encounter: Payer: Self-pay | Admitting: Neurology

## 2016-09-23 VITALS — BP 133/78 | HR 86 | Ht 64.0 in | Wt 265.6 lb

## 2016-09-23 DIAGNOSIS — Z79899 Other long term (current) drug therapy: Secondary | ICD-10-CM

## 2016-09-23 DIAGNOSIS — G40909 Epilepsy, unspecified, not intractable, without status epilepticus: Secondary | ICD-10-CM

## 2016-09-23 MED ORDER — TRAMADOL HCL 50 MG PO TABS: ORAL_TABLET | ORAL | 4 refills | Status: DC

## 2016-09-23 MED ORDER — TOPIRAMATE ER 200 MG PO CAP24: 200.0000 mg | ORAL_CAPSULE | Freq: Every day | ORAL | 11 refills | Status: DC

## 2016-09-23 NOTE — Progress Notes (Signed)
[facility]    Provider:  Dr Jaynee Eagles Referring Provider: No ref. provider found Primary Care Physician:  No primary care provider on file.  CC:  Lumbar radiculopathy.  Interval history 09/23/2016: She was evaluated by [facility] for right L5 radic and has a CT myelogram and three is no surgical intervention at this time there is no nerve root impingement. No Seizures. She is doing well on her Depakote and no seizures. Gabapentin did not help and the pain comes and goes. The last time she had a seizure was last march. She has been on the Depakote "forever". She was on Keppra for her pregnancy, she is not planning on having anymore kids and had a hysterectomy. Depakote works for her but the weight gain is significant and she would like  to try a different AED. Discussed risks including breakthrough seizures, discussed choices, Topamax is a good AED but often needs higher doses that can cause side effects. Lamictal does not work for her.   HPI:  Abigail Hall is a 36 y.o. female here as a referral from Dr. Karlton Lemon MD  for right leg pain and radiculopathy. Ongoing since 2015 no inciting events or trauma. She has had ESI which initially helped a little but not working anymore. She is having difficulty with weight gain since she cannot work out  especially on Depakote for her seizure disorder. She can't work out because of the right leg weakness and the pain. She has leg pain with radicular symptoms into the right leg with numbness of the foot. Foot numbness since August. The pain starts in the upper right leg and radiates down the side and back of the thigh and to the lateral lower leg and into the foot. Her leg gives out, weak. Most painful when in a position for a long time such as sitting in one position for a long time. At one point there was so much pain she couldn't even sit. Muscle relaxers don't help, hydrocodone help a little bit but not on it now and doesn't want to take it ,  hard to sleep because she wakes with pain. Her right foot is numb in the toes and in the lateral aspect of the foot. She can' t feel the bottom of her heel, continuously numb, she can;t drive for long periods. She gets cramps in the right foot. No changes in bowel or bladder.   Reviewed notes, labs and imaging from outside physicians, which showed:  BUN 8, creatinine 0.65 05/2014, TSH 1.552 10/2011  MRI lumbar spine 04/2016: personally reviewed images and agree with the following  Disc levels:  L1-2: Negative.  L2-3: Normal interspace. No disc bulge or disc protrusion. Left-sided facet arthrosis. No canal or foraminal stenosis.  L3-4: Negative.  L4-5: Mild diffuse degenerative disc bulge with disc desiccation, stable. No focal disc herniation. Mild bilateral facet arthrosis, right worse than left. No canal stenosis. Mild bilateral foraminal narrowing, stable.  L5-S1: Negative. Aic 6 IMPRESSION: 1. Shallow broad-based disc protrusion at L4-5 with resultant mild bilateral foraminal narrowing, stable from prior. No other significant degenerative disc disease. No new focal disc herniation. 2. Mild facet arthrosis at L2-3 and L4-5 as above.  Review of Systems: Patient complains of symptoms per HPI as well as the following symptoms: swelling in legs, anemia, joint pain, cramps, aching muscles, headache, numbness, weakness, restless legs. Pertinent negatives per HPI. All others negative.  Social History   Social History  . Marital status: Single    Spouse  name: N/A  . Number of children: 1  . Years of education: Some College   Occupational History  . [employer]    Social History Main Topics  . Smoking status: Never Smoker  . Smokeless tobacco: Never Used  . Alcohol use 0.0 oz/week     Comment: Once a month  . Drug use: No  . Sexual activity: Not on file   Other Topics Concern  . Not on file   Social History Narrative   Lives with herself and son   Caffeine use:  none   Right-handed    Family History  Problem Relation Age of Onset  . Diabetes Mother   . Hyperlipidemia Father   . Cancer Maternal Grandfather   . Hyperlipidemia Paternal Grandfather   . Diabetes Maternal Grandmother   . Anesthesia problems Neg Hx   . Hypotension Neg Hx   . Malignant hyperthermia Neg Hx   . Pseudochol deficiency Neg Hx     Past Medical History:  Diagnosis Date  . Anemia   . Anemia, iron deficiency 04/10/2012  . Constipation   . Depression 04/26/13   history - no current problems  . Diabetes mellitus    gestational DM 2013 pregnancy-resolved  . Generalized headaches   . Hemorrhoids   . History of blood transfusion 02/2013   [facility]  2 units transfused   . Nausea & vomiting    resolved after delivery - C/S, extreme  . Rectal bleeding   . Rectal pain   . Seasonal allergies   . Seizures ([place])    last seizure was  08/2012  . Thalassemia   . Weakness    resolved after C/S delivery    Past Surgical History:  Procedure Laterality Date  . ABDOMINAL HYSTERECTOMY    . CARPAL TUNNEL RELEASE  01/2009   right  . CESAREAN SECTION  12/31/2011   Procedure: CESAREAN SECTION;  Surgeon: Jolayne Haines, MD;  Location: [facility];  Service: Gynecology;  Laterality: N/A;  . DILATION AND CURETTAGE OF UTERUS     endometrial ablation  . EVALUATION UNDER ANESTHESIA WITH HEMORRHOIDECTOMY N/A 05/31/2013   Procedure: EXAM UNDER ANESTHESIA WITH EXTERNAL HEMORRHOIDECTOMY;  Surgeon: Adin Hector, MD;  Location: [facility];  Service: General;  Laterality: N/A;  . LABIOPLASTY  03/27/2012   Procedure: LABIAPLASTY;  Surgeon: Jolayne Haines, MD;  Location: [facility];  Service: Gynecology;  Laterality: N/A;  labia  . LAPAROSCOPIC TUBAL LIGATION  03/27/2012   Procedure: LAPAROSCOPIC TUBAL LIGATION;  Surgeon: Jolayne Haines, MD;  Location: [facility];  Service: Gynecology;  Laterality: Bilateral;  fallopian tubes caurtery  . LAPAROSCOPY     removal of cyst  . ROBOTIC ASSISTED TOTAL  HYSTERECTOMY N/A 05/02/2013   Procedure: ROBOTIC ASSISTED TOTAL HYSTERECTOMY;  Surgeon: Marvene Staff, MD;  Location: [facility];  Service: Gynecology;  Laterality: N/A;  . TONGUE SURGERY    . TRANSANAL HEMORRHOIDAL DEARTERIALIZATION N/A 05/31/2013   Procedure: [place] HEMORRHOIDAL LIGATION/PEXY;  Surgeon: Adin Hector, MD;  Location: [facility];  Service: General;  Laterality: N/A;  . TUBAL LIGATION    . UNILATERAL SALPINGECTOMY Right 05/02/2013   Procedure: UNILATERAL SALPINGECTOMY;  Surgeon: Marvene Staff, MD;  Location: [facility];  Service: Gynecology;  Laterality: Right;  . WISDOM TOOTH EXTRACTION      Current Outpatient Prescriptions  Medication Sig Dispense Refill  . ALPRAZolam (XANAX) 1 MG tablet TK 1 T PO TID PRN  2  . divalproex (DEPAKOTE ER) 500 MG 24  hr tablet Take 500 mg by mouth 2 (two) times daily.     . fluticasone (FLONASE) 50 MCG/ACT nasal spray Place 1 spray into the nose daily as needed for allergies.     Marland Kitchen gabapentin (NEURONTIN) 300 MG capsule Take 1 capsule (300 mg total) by mouth 3 (three) times daily. 90 capsule 11  . vitamin C (ASCORBIC ACID) 500 MG tablet Take 500 mg by mouth daily.    . Topiramate ER (TROKENDI XR) 200 MG CP24 Take 200 mg by mouth at bedtime. 60 capsule 11  . traMADol (ULTRAM) 50 MG tablet Take 1-2 tablet (50-100 mg total) by mouth every 6 (six) hours as needed. 30 tablet 4   No current facility-administered medications for this visit.     Allergies as of 09/23/2016 - Review Complete 09/23/2016  Allergen Reaction Noted  . Latex Itching, Swelling, and Rash 05/24/2011  . Keflex [cephalexin] Hives 03/01/2013    Vitals: BP 133/78   Pulse 86   Ht 5\' 4"  (1.626 m)   Wt 265 lb 9.6 oz (120.5 kg)   LMP 04/25/2013   BMI 45.59 kg/m  Last Weight:  Wt Readings from Last 1 Encounters:  09/23/16 265 lb 9.6 oz (120.5 kg)   Last Height:   Ht Readings from Last 1 Encounters:  09/23/16 5\' 4"  (1.626 m)       Physical exam: Exam: Gen: NAD,  conversant, well nourised, obese, well groomed                     CV: RRR, no MRG. No Carotid Bruits. No peripheral edema, warm, nontender Eyes: Conjunctivae clear without exudates or hemorrhage  Neuro: Detailed Neurologic Exam  Speech:    Speech is normal; fluent and spontaneous with normal comprehension.  Cognition:    The patient is oriented to person, place, and time;     recent and remote memory intact;     language fluent;     normal attention, concentration,     fund of knowledge Cranial Nerves:    The pupils are equal, round, and reactive to light. The fundi are normal and spontaneous venous pulsations are present. Visual fields are full to finger confrontation. Extraocular movements are intact. Trigeminal sensation is intact and the muscles of mastication are normal. The face is symmetric. The palate elevates in the midline. Hearing intact. Voice is normal. Shoulder shrug is normal. The tongue has normal motion without fasciculations.   Coordination:    Normal finger to nose and heel to shin. Normal rapid alternating movements.   Gait:    Heel-toe and tandem gait are normal.   Motor Observation:    No asymmetry, no atrophy, and no involuntary movements noted. Tone:    Normal muscle tone.    Posture:    Posture is normal. normal erect    Strength:   weakness right hip flexion, DF and leg flexion, inversion and eversion otherwise strength is V/V in the upper and lower limbs.      Sensation: decreased in an L5 distribution of the right leg     Reflex Exam:  DTR's: Possibly decreased reflex on the right AJ     Toes:    The toes are downgoing bilaterally.   Clonus:    Clonus is absent.  Assessment/Plan:  36 year old patient with likely L5 radiculopathy. She has  weakness right leg flexion, dorsiflexion and  inversion and eversion of the right foot, she has radicular pain and decreased sensation in an  L5 distribution. She has followed at [facility] for this. Also  with seizure disorder. On Depakote but wants a different agent. She has tried and failed Keppra, Lamictal. Depakote causing significant weight gain. Advised that switching AEDs could cause breakthrough seizures but she still would like to proceed. We can try Topiramate.  Remember to drink plenty of fluid, eat healthy meals and do not skip any meals. Try to eat protein with a every meal and eat a healthy snack such as fruit or nuts in between meals. Try to keep a regular sleep-wake schedule and try to exercise daily, particularly in the form of walking, 20-30 minutes a day, if you can.   As far as your medications are concerned, I would like to suggest: Topiramate ER (Qudexy) Week one: 25mg  Week two 50mg  Week three 100mg  Week four 150mg  Week five 200mg   I would like to see you back in 2 months, sooner if we need to. Please call us with any interim questions, concerns, problems, updates or refill requests.     Cc: Karlton Lemon MD   Sarina Ill, MD  [facility] [address] [place], [place] [address]  Phone [phone] Fax [phone]  A total of 25 minutes was spent in with this patient. Over half this time was spent on counseling patient on the seizure diagnosis and different therapeutic options available.

## 2016-09-23 NOTE — Patient Instructions (Addendum)
Remember to drink plenty of fluid, eat healthy meals and do not skip any meals. Try to eat protein with a every meal and eat a healthy snack such as fruit or nuts in between meals. Try to keep a regular sleep-wake schedule and try to exercise daily, particularly in the form of walking, 20-30 minutes a day, if you can.   As far as your medications are concerned, I would like to suggest: Week one: 25mg  Week two 50mg  Week three 100mg  Week four 150mg  Week five 200mg   I would like to see you back in 2 months, sooner if we need to. Please call us with any interim questions, concerns, problems, updates or refill requests.   Please also call us for any test results so we can go over those with you on the phone.  My clinical assistant and will answer any of your questions and relay your messages to me and also relay most of my messages to you.   Our phone number is [phone]. We also have an after hours call service for urgent matters and there is a physician on-call for urgent questions. For any emergencies you know to call 911 or go to the nearest emergency room  Topiramate extended-release capsules What is this medicine? TOPIRAMATE (toe PYRE a mate) is used to treat seizures in adults or children with epilepsy. It is also used for the prevention of migraine headaches. COMMON BRAND NAME(S): Trokendi XR What should I tell my health care provider before I take this medicine? They need to know if you have any of these conditions: -cirrhosis of the liver or liver disease -diarrhea -glaucoma -kidney stones or kidney disease -lung disease like asthma, obstructive pulmonary disease, emphysema -metabolic acidosis -on a ketogenic diet -scheduled for surgery or a procedure -suicidal thoughts, plans, or attempt; a previous suicide attempt by you or a family member -an unusual or allergic reaction to topiramate, other medicines, foods, dyes, or preservatives -pregnant or trying to get  pregnant -breast-feeding How should I use this medicine? Take this medicine by mouth with a glass of water. Follow the directions on the prescription label. Trokendi XR capsules must be swallowed whole. Do not sprinkle on food, break, crush, dissolve, or chew. Qudexy XR capsules may be swallowed whole or opened and sprinkled on a small amount of soft food. This mixture must be swallowed immediately. Do not chew or store mixture for later use. You may take this medicine with meals. Take your medicine at regular intervals. Do not take it more often than directed. Talk to your pediatrician regarding the use of this medicine in children. Special care may be needed. While Trokendi XR may be prescribed for children as young as 6 years and Qudexy XR may be prescribed for children as young as 2 years for selected conditions, precautions do apply. What if I miss a dose? If you miss a dose, take it as soon as you can. If it is almost time for your next dose, take only that dose. Do not take double or extra doses. What may interact with this medicine? Do not take this medicine with any of the following medications: -probenecid This medicine may also interact with the following medications: -acetazolamide -alcohol -amitriptyline -birth control pills -digoxin -hydrochlorothiazide -lithium -medicines for pain, sleep, or muscle relaxation -metformin -methazolamide -other seizure or epilepsy medicines -pioglitazone -risperidone What should I watch for while using this medicine? Visit your doctor or health care professional for regular checks on your progress. Do not stop taking  this medicine suddenly. This increases the risk of seizures if you are using this medicine to control epilepsy. Wear a medical identification bracelet or chain to say you have epilepsy or seizures, and carry a card that lists all your medicines. This medicine can decrease sweating and increase your body temperature. Watch for signs  of deceased sweating or fever, especially in children. Avoid extreme heat, hot baths, and saunas. Be careful about exercising, especially in hot weather. Contact your health care provider right away if you notice a fever or decrease in sweating. You should drink plenty of fluids while taking this medicine. If you have had kidney stones in the past, this will help to reduce your chances of forming kidney stones. If you have stomach pain, with nausea or vomiting and yellowing of your eyes or skin, call your doctor immediately. You may get drowsy, dizzy, or have blurred vision. Do not drive, use machinery, or do anything that needs mental alertness until you know how this medicine affects you. To reduce dizziness, do not sit or stand up quickly, especially if you are an older patient. Alcohol can increase drowsiness and dizziness. Avoid alcoholic drinks. Do not drink alcohol for 6 hours before or 6 hours after taking Trokendi XR. If you notice blurred vision, eye pain, or other eye problems, seek medical attention at once for an eye exam. The use of this medicine may increase the chance of suicidal thoughts or actions. Pay special attention to how you are responding while on this medicine. Any worsening of mood, or thoughts of suicide or dying should be reported to your health care professional right away. This medicine may increase the chance of developing metabolic acidosis. If left untreated, this can cause kidney stones, bone disease, or slowed growth in children. Symptoms include breathing fast, fatigue, loss of appetite, irregular heartbeat, or loss of consciousness. Call your doctor immediately if you experience any of these side effects. Also, tell your doctor about any surgery you plan on having while taking this medicine since this may increase your risk for metabolic acidosis. Birth control pills may not work properly while you are taking this medicine. Talk to your doctor about using an extra method  of birth control. Women who become pregnant while using this medicine may enroll in the [place] Pregnancy Registry by calling [phone]. This registry collects information about the safety of antiepileptic drug use during pregnancy. What side effects may I notice from receiving this medicine? Side effects that you should report to your doctor or health care professional as soon as possible: -allergic reactions like skin rash, itching or hives, swelling of the face, lips, or tongue -decreased sweating and/or rise in body temperature -depression -difficulty breathing, fast or irregular breathing patterns -difficulty speaking -difficulty walking or controlling muscle movements -hearing impairment -redness, blistering, peeling or loosening of the skin, including inside the mouth -tingling, pain or numbness in the hands or feet -unusually weak or tired -worsening of mood, thoughts or actions of suicide or dying Side effects that usually do not require medical attention (report to your doctor or health care professional if they continue or are bothersome): -altered taste -back pain, joint or muscle aches and pains -diarrhea, or constipation -headache -loss of appetite -nausea -stomach upset, indigestion -tremors Where should I keep my medicine? Keep out of the reach of children. Store at room temperature between 15 and 30 degrees C (59 and 86 degrees F) in a tightly closed container. Protect from moisture. Throw away  any unused medicine after the expiration date.  2017 Elsevier/Gold Standard (2015-11-21 12:33:11)

## 2016-09-23 NOTE — Progress Notes (Signed)
Tramadol rx printed, signed, faxed to pharmacy [facility]).

## 2016-09-24 LAB — COMPREHENSIVE METABOLIC PANEL
ALT: 16 IU/L (ref 0–32)
AST: 16 IU/L (ref 0–40)
Albumin/Globulin Ratio: 1.5 (ref 1.2–2.2)
Albumin: 3.8 g/dL (ref 3.5–5.5)
Alkaline Phosphatase: 73 IU/L (ref 39–117)
BUN/Creatinine Ratio: 11 (ref 9–23)
BUN: 7 mg/dL (ref 6–20)
Bilirubin Total: 0.5 mg/dL (ref 0.0–1.2)
CO2: 24 mmol/L (ref 18–29)
Calcium: 9.1 mg/dL (ref 8.7–10.2)
Chloride: 100 mmol/L (ref 96–106)
Creatinine, Ser: 0.62 mg/dL (ref 0.57–1.00)
GFR calc Af Amer: 133 mL/min/{1.73_m2} (ref >59)
GFR calc non Af Amer: 116 mL/min/{1.73_m2} (ref >59)
Globulin, Total: 2.5 g/dL (ref 1.5–4.5)
Glucose: 177 mg/dL — ABNORMAL HIGH (ref 65–99)
Potassium: 4.5 mmol/L (ref 3.5–5.2)
Sodium: 138 mmol/L (ref 134–144)
Total Protein: 6.3 g/dL (ref 6.0–8.5)

## 2016-09-24 LAB — CBC
Hematocrit: 29.3 % — ABNORMAL LOW (ref 34.0–46.6)
Hemoglobin: 8.8 g/dL — ABNORMAL LOW (ref 11.1–15.9)
MCH: 17.5 pg — ABNORMAL LOW (ref 26.6–33.0)
MCHC: 30 g/dL — ABNORMAL LOW (ref 31.5–35.7)
MCV: 58 fL — ABNORMAL LOW (ref 79–97)
Platelets: 297 10*3/uL (ref 150–379)
RBC: 5.03 x10E6/uL (ref 3.77–5.28)
RDW: 17.6 % — ABNORMAL HIGH (ref 12.3–15.4)
WBC: 6.9 10*3/uL (ref 3.4–10.8)

## 2016-09-27 ENCOUNTER — Emergency Department (HOSPITAL_BASED_OUTPATIENT_CLINIC_OR_DEPARTMENT_OTHER)
Admission: EM | Admit: 2016-09-27 | Discharge: 2016-09-27 | Disposition: A | Discharge status: Home / Self Care | Payer: Managed Care, Other (non HMO) | Attending: Emergency Medicine | Admitting: Emergency Medicine

## 2016-09-27 ENCOUNTER — Encounter (HOSPITAL_BASED_OUTPATIENT_CLINIC_OR_DEPARTMENT_OTHER): Payer: Self-pay | Admitting: *Deleted

## 2016-09-27 DIAGNOSIS — R69 Illness, unspecified: Secondary | ICD-10-CM

## 2016-09-27 DIAGNOSIS — R52 Pain, unspecified: Secondary | ICD-10-CM | POA: Diagnosis not present

## 2016-09-27 DIAGNOSIS — J029 Acute pharyngitis, unspecified: Secondary | ICD-10-CM | POA: Insufficient documentation

## 2016-09-27 DIAGNOSIS — Z79899 Other long term (current) drug therapy: Secondary | ICD-10-CM | POA: Insufficient documentation

## 2016-09-27 DIAGNOSIS — R05 Cough: Secondary | ICD-10-CM | POA: Diagnosis not present

## 2016-09-27 DIAGNOSIS — J111 Influenza due to unidentified influenza virus with other respiratory manifestations: Secondary | ICD-10-CM

## 2016-09-27 DIAGNOSIS — E119 Type 2 diabetes mellitus without complications: Secondary | ICD-10-CM | POA: Diagnosis not present

## 2016-09-27 DIAGNOSIS — R509 Fever, unspecified: Secondary | ICD-10-CM | POA: Insufficient documentation

## 2016-09-27 MED ORDER — IBUPROFEN 800 MG PO TABS: 800.0000 mg | ORAL_TABLET | Freq: Three times a day (TID) | ORAL | 0 refills | Status: DC | PRN

## 2016-09-27 MED ORDER — PROMETHAZINE-DM 6.25-15 MG/5ML PO SYRP: 5.0000 mL | ORAL_SOLUTION | Freq: Four times a day (QID) | ORAL | 0 refills | Status: DC | PRN

## 2016-09-27 MED ORDER — GUAIFENESIN ER 1200 MG PO TB12: 1.0000 | ORAL_TABLET | Freq: Two times a day (BID) | ORAL | 0 refills | Status: DC

## 2016-09-27 MED FILL — MUCINEX ER 1,200 MG TABLET: 1200 | 14 days supply | Qty: 28 | Fill #0

## 2016-09-27 MED FILL — PROMETHAZINE-DM SYRUP: 6.25-15 | 6 days supply | Qty: 120 | Fill #0

## 2016-09-27 MED FILL — IBUPROFEN 800 MG TABLET: 800 | 7 days supply | Qty: 21 | Fill #0

## 2016-09-27 NOTE — ED Provider Notes (Signed)
[facility] Provider Note   CSN: [id number] Arrival date & time: 09/27/16  1114     History   Chief Complaint Chief Complaint  Patient presents with  . Cough  . Sore Throat    HPI Abigail Hall is a 36 y.o. female.  HPI Patient presents to the emergency department with flulike illness with fever, cough, sore throat and body aches for the last 5 days.  Patient states she took over-the-counter cough and cold medications with minimal relief of her symptoms.  Patient states nothing seems make her condition better or worse. The patient denies chest pain, shortness of breath, headache,blurred vision, neck pain, weakness, numbness, dizziness, anorexia, edema, abdominal pain, nausea, vomiting, diarrhea, rash, back pain, dysuria, hematemesis, bloody stool, near syncope, or syncope.  Past Medical History:  Diagnosis Date  . Anemia   . Anemia, iron deficiency 04/10/2012  . Constipation   . Depression 04/26/13   history - no current problems  . Diabetes mellitus    gestational DM 2013 pregnancy-resolved  . Generalized headaches   . Hemorrhoids   . History of blood transfusion 02/2013   [facility]  2 units transfused   . Nausea & vomiting    resolved after delivery - C/S, extreme  . Rectal bleeding   . Rectal pain   . Seasonal allergies   . Seizures ([place])    last seizure was  08/2012  . Thalassemia   . Weakness    resolved after C/S delivery    Patient Active Problem List   Diagnosis Date Noted  . Left carpal tunnel syndrome 05/27/2015  . Low back pain 03/05/2014  . Constipation, chronic 05/14/2013  . Obesity, Class III, BMI 40-49.9 (morbid obesity) ([place]) 05/14/2013  . Microcytic anemia 03/02/2013  . UTI (urinary tract infection) 03/02/2013  . Anemia, iron deficiency 04/10/2012  . 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 Hemorrhoids, internal, with bleeding s/p [facility] 05/31/2013 12/13/2011  . External hemorrhoids with pain s/p removal 05/31/2013  12/13/2011  . Anemia 12/13/2011  . Beta thalassemia minor 07/15/2011  . Medication exposure during first trimester of pregnancy 07/15/2011    Past Surgical History:  Procedure Laterality Date  . ABDOMINAL HYSTERECTOMY    . CARPAL TUNNEL RELEASE  01/2009   right  . CESAREAN SECTION  12/31/2011   Procedure: CESAREAN SECTION;  Surgeon: Jolayne Haines, MD;  Location: [facility];  Service: Gynecology;  Laterality: N/A;  . DILATION AND CURETTAGE OF UTERUS     endometrial ablation  . EVALUATION UNDER ANESTHESIA WITH HEMORRHOIDECTOMY N/A 05/31/2013   Procedure: EXAM UNDER ANESTHESIA WITH EXTERNAL HEMORRHOIDECTOMY;  Surgeon: Adin Hector, MD;  Location: [facility];  Service: General;  Laterality: N/A;  . LABIOPLASTY  03/27/2012   Procedure: LABIAPLASTY;  Surgeon: Jolayne Haines, MD;  Location: [facility];  Service: Gynecology;  Laterality: N/A;  labia  . LAPAROSCOPIC TUBAL LIGATION  03/27/2012   Procedure: LAPAROSCOPIC TUBAL LIGATION;  Surgeon: Jolayne Haines, MD;  Location: [facility];  Service: Gynecology;  Laterality: Bilateral;  fallopian tubes caurtery  . LAPAROSCOPY     removal of cyst  . ROBOTIC ASSISTED TOTAL HYSTERECTOMY N/A 05/02/2013   Procedure: ROBOTIC ASSISTED TOTAL HYSTERECTOMY;  Surgeon: Marvene Staff, MD;  Location: [facility];  Service: Gynecology;  Laterality: N/A;  . TONGUE SURGERY    . TRANSANAL HEMORRHOIDAL DEARTERIALIZATION N/A 05/31/2013   Procedure: [facility] HEMORRHOIDAL LIGATION/PEXY;  Surgeon: Adin Hector, MD;  Location: [facility];  Service: General;  Laterality: N/A;  .  TUBAL LIGATION    . UNILATERAL SALPINGECTOMY Right 05/02/2013   Procedure: UNILATERAL SALPINGECTOMY;  Surgeon: Marvene Staff, MD;  Location: [facility];  Service: Gynecology;  Laterality: Right;  . WISDOM TOOTH EXTRACTION      OB History    Gravida Para Term Preterm AB Living   3 1 0 1 2 1    SAB TAB Ectopic Multiple Live Births   1 1 0 0 1       Home Medications    Prior to Admission medications     Medication Sig Start Date End Date Taking? Authorizing Provider  ALPRAZolam Duanne Moron) 1 MG tablet TK 1 T PO TID PRN 05/22/15   Historical Provider, MD  divalproex (DEPAKOTE ER) 500 MG 24 hr tablet Take 500 mg by mouth 2 (two) times daily.     Historical Provider, MD  fluticasone (FLONASE) 50 MCG/ACT nasal spray Place 1 spray into the nose daily as needed for allergies.  11/18/12   Historical Provider, MD  gabapentin (NEURONTIN) 300 MG capsule Take 1 capsule (300 mg total) by mouth 3 (three) times daily. 07/12/16   Melvenia Beam, MD  Topiramate ER (TROKENDI XR) 200 MG CP24 Take 200 mg by mouth at bedtime. 09/23/16   Melvenia Beam, MD  traMADol (ULTRAM) 50 MG tablet Take 1-2 tablet (50-100 mg total) by mouth every 6 (six) hours as needed. 09/23/16   Melvenia Beam, MD  vitamin C (ASCORBIC ACID) 500 MG tablet Take 500 mg by mouth daily.    Historical Provider, MD    Family History Family History  Problem Relation Age of Onset  . Diabetes Mother   . Hyperlipidemia Father   . Cancer Maternal Grandfather   . Hyperlipidemia Paternal Grandfather   . Diabetes Maternal Grandmother   . Anesthesia problems Neg Hx   . Hypotension Neg Hx   . Malignant hyperthermia Neg Hx   . Pseudochol deficiency Neg Hx     Social History Social History  Substance Use Topics  . Smoking status: Never Smoker  . Smokeless tobacco: Never Used  . Alcohol use 0.0 oz/week     Comment: Once a month     Allergies   Latex and Keflex [cephalexin]   Review of Systems Review of Systems All other systems negative except as documented in the HPI. All pertinent positives and negatives as reviewed in the HPI.  Physical Exam Updated Vital Signs BP 123/75 (BP Location: Right Arm)   Pulse 84   Temp 98.5 F (36.9 C) (Oral)   Resp 20   Ht 5\' 4"  (1.626 m)   Wt 120.1 kg   LMP 04/25/2013   SpO2 100%   BMI 45.44 kg/m   Physical Exam  Constitutional: She is oriented to person, place, and time. She appears  well-developed and well-nourished. No distress.  HENT:  Head: Normocephalic and atraumatic.  Mouth/Throat: Oropharynx is clear and moist.  Eyes: Pupils are equal, round, and reactive to light.  Neck: Normal range of motion. Neck supple.  Cardiovascular: Normal rate, regular rhythm and normal heart sounds.  Exam reveals no gallop and no friction rub.   No murmur heard. Pulmonary/Chest: Effort normal and breath sounds normal. No respiratory distress. She has no wheezes.  Neurological: She is alert and oriented to person, place, and time. She exhibits normal muscle tone. Coordination normal.  Skin: Skin is warm and dry. Capillary refill takes less than 2 seconds. No rash noted. No erythema.  Psychiatric: She has a normal mood and  affect. Her behavior is normal.  Nursing note and vitals reviewed.    ED Treatments / Results  Labs (all labs ordered are listed, but only abnormal results are displayed) Labs Reviewed - No data to display  EKG  EKG Interpretation None       Radiology No results found.  Procedures Procedures (including critical care time)  Medications Ordered in ED Medications - No data to display   Initial Impression / Assessment and Plan / ED Course  I have reviewed the triage vital signs and the nursing notes.  Pertinent labs & imaging results that were available during my care of the patient were reviewed by me and considered in my medical decision making (see chart for details).    Patient be treated for influenza-like illness.  Told to return here as needed.  Patient agrees the plan and all questions were answered  Final Clinical Impressions(s) / ED Diagnoses   Final diagnoses:  None    New Prescriptions New Prescriptions   No medications on file     Dalia Heading, PA-C 09/27/16 [place], DO 09/28/16 IC:[id number]

## 2016-09-27 NOTE — ED Notes (Signed)
Pt directed to pharmacy. Worknote given

## 2016-09-27 NOTE — ED Triage Notes (Signed)
Fever, cough, sore throat and body aches x 5 days.

## 2016-09-27 NOTE — Discharge Instructions (Signed)
Return here as needed. Follow up with your doctor. Increase your fluid intake. Rest as much as possible.

## 2016-09-29 ENCOUNTER — Telehealth: Payer: Self-pay | Admitting: *Deleted

## 2016-09-29 NOTE — Telephone Encounter (Signed)
I have spoken with Abigail Hall and per AA, reviewed lab results as below.  She verbalized understanding of same, sts. is aware of anemia, will review elevated blood glucose with pcp/fim

## 2016-09-29 NOTE — Telephone Encounter (Signed)
-----   Message from Melvenia Beam, MD sent at 09/25/2016  8:41 PM EST ----- Labs show elevated glucose 177 which is non specific as she was not fasting for this lab and if she had recently eaten this could elevated glucose. She is anemic but this stable and she should continue to follow up with primary care on this thanks

## 2016-10-07 ENCOUNTER — Encounter: Payer: BLUE CROSS/BLUE SHIELD | Admitting: Neurology

## 2016-10-29 ENCOUNTER — Encounter: Payer: Self-pay | Admitting: Hematology & Oncology

## 2016-11-02 ENCOUNTER — Other Ambulatory Visit: Payer: Self-pay | Admitting: Family

## 2016-11-02 ENCOUNTER — Other Ambulatory Visit: Payer: Self-pay | Admitting: *Deleted

## 2016-11-02 DIAGNOSIS — D508 Other iron deficiency anemias: Secondary | ICD-10-CM

## 2016-11-02 DIAGNOSIS — D563 Thalassemia minor: Secondary | ICD-10-CM

## 2016-11-03 ENCOUNTER — Ambulatory Visit (HOSPITAL_BASED_OUTPATIENT_CLINIC_OR_DEPARTMENT_OTHER): Payer: 59 | Admitting: Family

## 2016-11-03 ENCOUNTER — Other Ambulatory Visit: Payer: Self-pay | Admitting: *Deleted

## 2016-11-03 ENCOUNTER — Other Ambulatory Visit (HOSPITAL_BASED_OUTPATIENT_CLINIC_OR_DEPARTMENT_OTHER): Payer: 59

## 2016-11-03 ENCOUNTER — Encounter: Payer: Self-pay | Admitting: Family

## 2016-11-03 VITALS — BP 130/66 | HR 78 | Temp 98.3°F | Resp 17 | Wt 264.8 lb

## 2016-11-03 DIAGNOSIS — D508 Other iron deficiency anemias: Secondary | ICD-10-CM

## 2016-11-03 DIAGNOSIS — D563 Thalassemia minor: Secondary | ICD-10-CM

## 2016-11-03 DIAGNOSIS — D561 Beta thalassemia: Secondary | ICD-10-CM

## 2016-11-03 DIAGNOSIS — D509 Iron deficiency anemia, unspecified: Secondary | ICD-10-CM | POA: Diagnosis not present

## 2016-11-03 DIAGNOSIS — K649 Unspecified hemorrhoids: Secondary | ICD-10-CM

## 2016-11-03 DIAGNOSIS — K5900 Constipation, unspecified: Secondary | ICD-10-CM

## 2016-11-03 LAB — CBC WITH DIFFERENTIAL (CANCER CENTER ONLY)
BASO#: 0 10*3/uL (ref 0.0–0.2)
BASO%: 0.3 % (ref 0.0–2.0)
EOS%: 1.2 % (ref 0.0–7.0)
Eosinophils Absolute: 0.1 10*3/uL (ref 0.0–0.5)
HCT: 31.4 % — ABNORMAL LOW (ref 34.8–46.6)
HGB: 10 g/dL — ABNORMAL LOW (ref 11.6–15.9)
LYMPH#: 3.9 10*3/uL — ABNORMAL HIGH (ref 0.9–3.3)
LYMPH%: 34.4 % (ref 14.0–48.0)
MCH: 18.3 pg — ABNORMAL LOW (ref 26.0–34.0)
MCHC: 31.8 g/dL — ABNORMAL LOW (ref 32.0–36.0)
MCV: 58 fL — ABNORMAL LOW (ref 81–101)
MONO#: 0.6 10*3/uL (ref 0.1–0.9)
MONO%: 5.1 % (ref 0.0–13.0)
NEUT#: 6.8 10*3/uL — ABNORMAL HIGH (ref 1.5–6.5)
NEUT%: 59 % (ref 39.6–80.0)
Platelets: 339 10*3/uL (ref 145–400)
RBC: 5.45 10*6/uL — ABNORMAL HIGH (ref 3.70–5.32)
RDW: 17.6 % — ABNORMAL HIGH (ref 11.1–15.7)
WBC: 11.5 10*3/uL — ABNORMAL HIGH (ref 3.9–10.0)

## 2016-11-03 MED ORDER — FOLIC ACID 1 MG PO TABS: 2.0000 mg | ORAL_TABLET | Freq: Every day | ORAL | 6 refills | Status: DC

## 2016-11-03 MED ORDER — POLYETHYLENE GLYCOL 3350 17 G PO PACK: 17.0000 g | PACK | Freq: Every day | ORAL | 2 refills | Status: DC | PRN

## 2016-11-03 NOTE — Progress Notes (Addendum)
Hematology and Oncology Follow Up Visit  Abigail Hall [id number] 1981-08-10 36 y.o. 11/03/2016   Principle Diagnosis:  Thalassemia minor (beta thalassemia) History of iron deficiency anemia  Current Therapy:   Folic acid 2 mg by mouth daily - has not been taking IV iron as indicated - last received in September 2014    Interim History:  Abigail Hall is here today for follow-up. She is symptomatic with fatigue, weakness and chills. Hgb today is 10.0 with an MCV of 58. She states that she has not been taking folic acid. She has not received IV iron in almost 4 years.  She has also noticed some numbness and tingling in her right foot. She has a long history of back problems and feels that this may possibly be the cause.  She had an emergency hysterectomy in 2014 due to hemorrhage. She has had no episodes of bruising or petechiae.  No fever, n/v, cough, rash, dizziness, SOB, chest pain, palpitations, abdominal pain or changes in bowel or bladder habits. She has chronic constipation with hemorrhoids that will occasionally bleed when straining. She would like a prescription for a stool softener when needed.  No swelling or tenderness in her extremities. No lymphadenopathy found on exam.  She has maintained a good appetite and is staying well hydrated. Her weight is stable.   Medications:  Allergies as of 11/03/2016      Reactions   Latex Itching, Swelling, Rash   Where touched.   Keflex [cephalexin] Hives      Medication List       Accurate as of 11/03/16 12:56 PM. Always use your most recent med list.          ALPRAZolam 1 MG tablet Commonly known as:  XANAX TK 1 T PO TID PRN   divalproex 500 MG 24 hr tablet Commonly known as:  DEPAKOTE ER Take 500 mg by mouth 2 (two) times daily.   fluticasone 50 MCG/ACT nasal spray Commonly known as:  FLONASE Place 1 spray into the nose daily as needed for allergies.   gabapentin 300 MG capsule Commonly known as:  NEURONTIN Take  1 capsule (300 mg total) by mouth 3 (three) times daily.   ibuprofen 800 MG tablet Commonly known as:  ADVIL,MOTRIN Take 1 tablet (800 mg total) by mouth every 8 (eight) hours as needed.   Topiramate ER 200 MG Cp24 Commonly known as:  TROKENDI XR Take 200 mg by mouth at bedtime.   traMADol 50 MG tablet Commonly known as:  ULTRAM Take 1-2 tablet (50-100 mg total) by mouth every 6 (six) hours as needed.   vitamin C 500 MG tablet Commonly known as:  ASCORBIC ACID Take 500 mg by mouth daily.       Allergies:  Allergies  Allergen Reactions  . Latex Itching, Swelling and Rash    Where touched.  . Keflex [Cephalexin] Hives    Past Medical History, Surgical history, Social history, and Family History were reviewed and updated.  Review of Systems: All other 10 point review of systems is negative.   Physical Exam:  weight is 264 lb 12.8 oz (120.1 kg). Her oral temperature is 98.3 F (36.8 C). Her blood pressure is 130/66 and her pulse is 78. Her respiration is 17 and oxygen saturation is 100%.   Wt Readings from Last 3 Encounters:  11/03/16 264 lb 12.8 oz (120.1 kg)  09/27/16 264 lb 11.2 oz (120.1 kg)  09/23/16 265 lb 9.6 oz (120.5 kg)    Ocular:  Sclerae unicteric, pupils equal, round and reactive to light Ear-nose-throat: Oropharynx clear, dentition fair Lymphatic: No cervical, supraclavicular or axillary adenopathy Lungs no rales or rhonchi, good excursion bilaterally Heart regular rate and rhythm, no murmur appreciated Abd soft, nontender, positive bowel sounds, no liver or spleen tip palpated on exam, no fluid wave MSK no focal spinal tenderness, no joint edema Neuro: non-focal, well-oriented, appropriate affect Breasts: Deferred  Lab Results  Component Value Date   WBC 11.5 (H) 11/03/2016   HGB 10.0 (L) 11/03/2016   HCT 31.4 (L) 11/03/2016   MCV 58 (L) 11/03/2016   PLT 339 11/03/2016   Lab Results  Component Value Date   FERRITIN 1,639 (H) 05/24/2014   IRON  67 05/24/2014   TIBC 296 05/24/2014   UIBC 229 05/24/2014   IRONPCTSAT 23 05/24/2014   Lab Results  Component Value Date   RETICCTPCT 4.0 (H) 05/24/2014   RBC 5.45 (H) 11/03/2016   RETICCTABS 202.8 (H) 05/24/2014   No results found for: KPAFRELGTCHN, LAMBDASER, KAPLAMBRATIO No results found for: IGGSERUM, IGA, IGMSERUM No results found for: Odetta Pink, SPEI   Chemistry      Component Value Date/Time   NA 138 09/23/2016 0944   K 4.5 09/23/2016 0944   CL 100 09/23/2016 0944   CO2 24 09/23/2016 0944   BUN 7 09/23/2016 0944   CREATININE 0.62 09/23/2016 0944      Component Value Date/Time   CALCIUM 9.1 09/23/2016 0944   ALKPHOS 73 09/23/2016 0944   AST 16 09/23/2016 0944   ALT 16 09/23/2016 0944   BILITOT 0.5 09/23/2016 0944     Impression and Plan: Abigail Hall is a very pleasant 36 yo [name] with history of beta thalassemia and iron deficiency anemia. She has not received IV iron in over 3 years and has not been taking her folic acid.  Hgb is 10.0 with an MCV of 58. We will have her restart her folic acid 2 mg PO daily.  We will see what her iron studies show and plan to bring her back in next week for an infusion if needed.  We will schedule her follow-up once we have her labs back.  She will contact our office with any questions or concerns. We can certainly see her sooner if need be.   [name], NP 3/21/201812:56 PM   Addendum: Patient's Iron studies and erythropoietin levels are stable. No IV iron infusion needed at this time. The beta thalassemia seems to be the biggest issue effecting her Hgb and MCV at this time. She verbalized that she has restarted the folic acid 2 mg PO daily. We will plan to see her back in 2 months for repeat lab work and follow-up. This should give the folic acid enough time to improve her counts.  Note faxed to Denman George, RN with [facility]  (Dr. Eldridge Abrahams)  Laverna Peace, FNP-BC [place]

## 2016-11-04 LAB — IRON AND TIBC
%SAT: 24 % (ref 21–57)
Iron: 68 ug/dL (ref 41–142)
TIBC: 281 ug/dL (ref 236–444)
UIBC: 214 ug/dL (ref 120–384)

## 2016-11-04 LAB — RETICULOCYTES: Reticulocyte Count: 2.9 % — ABNORMAL HIGH (ref 0.6–2.6)

## 2016-11-04 LAB — FERRITIN: Ferritin: 1452 ng/ml — ABNORMAL HIGH (ref 9–269)

## 2016-11-04 LAB — ERYTHROPOIETIN: Erythropoietin: 22.8 m[IU]/mL — ABNORMAL HIGH (ref 2.6–18.5)

## 2016-11-05 ENCOUNTER — Other Ambulatory Visit: Payer: Self-pay | Admitting: Family

## 2016-11-05 DIAGNOSIS — D508 Other iron deficiency anemias: Secondary | ICD-10-CM

## 2016-11-05 DIAGNOSIS — D563 Thalassemia minor: Secondary | ICD-10-CM

## 2016-11-05 NOTE — Progress Notes (Signed)
Patient has beta thalassemia and has not been taking her folic acid. MCV was 58 with a Hgb of 10.0. She has restarted the folic acid 2 mg PO daily and will give this 2 months to work before having her come back in for repeat lab work and follow-up. Iron studies and Erythropoietin were stable no infusion needed at this time.

## 2016-11-09 ENCOUNTER — Ambulatory Visit (INDEPENDENT_AMBULATORY_CARE_PROVIDER_SITE_OTHER): Payer: 59 | Admitting: Neurology

## 2016-11-09 ENCOUNTER — Telehealth: Payer: Self-pay | Admitting: Neurology

## 2016-11-09 ENCOUNTER — Encounter: Payer: Self-pay | Admitting: Neurology

## 2016-11-09 VITALS — BP 137/84 | HR 101 | Wt 258.0 lb

## 2016-11-09 DIAGNOSIS — Z79899 Other long term (current) drug therapy: Secondary | ICD-10-CM

## 2016-11-09 NOTE — Progress Notes (Signed)
Letter completed and mailed to pt's home address.

## 2016-11-09 NOTE — Telephone Encounter (Signed)
Patient called office in reference to having no energy, wanting to lay in bed all day, hair has been falling out ( a lot per patient).  Patient has been taking the Topamax 200mg  and patient doesn't know what to do.  Symptoms have been noticed for about a month.  Please call

## 2016-11-09 NOTE — Telephone Encounter (Signed)
Pt was last seen 09/23/16 w/ weakness of right leg and lumbar radiculopathy pain for which she is followed at [facility]. She also has seizure d/o and had been on Depakote but was causing weight gain so asked for a different med. She's tried and failed Keppra and Lamictal. Depakote causing significant weight gain. She was started on topiramate. Is also on gabapentin and tramadol. C/o increased fatigue, decreased energy and hair loss since starting Trokendi. Returned call to pt. She currently has f/u in April but requested sooner appt. Worked in this afternoon @ 2 p.m.

## 2016-11-09 NOTE — Patient Instructions (Signed)
Remember to drink plenty of fluid, eat healthy meals and do not skip any meals. Try to eat protein with a every meal and eat a healthy snack such as fruit or nuts in between meals. Try to keep a regular sleep-wake schedule and try to exercise daily, particularly in the form of walking, 20-30 minutes a day, if you can.   As far as your medications are concerned, I would like to suggest: Stop Trokendi  As far as diagnostic testing: lab today  I would like to see you back in 4 weeks, sooner if we need to. Please call us with any interim questions, concerns, problems, updates or refill requests.   Our phone number is [phone]. We also have an after hours call service for urgent matters and there is a physician on-call for urgent questions. For any emergencies you know to call 911 or go to the nearest emergency room

## 2016-11-09 NOTE — Progress Notes (Signed)
[facility]   Provider:  Dr Jaynee Eagles Referring Provider: [name] Primary Care Physician:  No primary care provider on file.  CC: Lumbar radiculopathy and seizure disorder  Interval History 11/09/2016: Patient returns for follow-up on seizure disorder. She has been on Depakote. At last appointment she wanted to switch agents due to weight gain side placed her on topiramate. She has been on Keppra in the past during pregnancy. Lamictal did not work for her in the past. 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 This could be the cause of her significant fatigue and weakness over the last month. Other causes can be medication side effects of topiramate or being on both topiramate and Depakote. Hair loss can be caused by topiramate. We can stop the Topiramate. She doesn't know if she is snoring. Just started over the last month. She has been missing work due to being so tired. Was hoping to switch Depakote for Topiramate but need to stop and see if this is causing side effects. Patient crying in the office today, requesting disability for this week for me, endorses multiple times that she is taking the topiramate. States states she last took topiramate last evening. Will check a Topiramate level.  Interval history 09/23/2016: She was evaluated by [facility] for right L5 radic and has a CT myelogram and three is no surgical intervention at this time there is no nerve root impingement. No Seizures. She is doing well on her Depakote and no seizures. Gabapentin did not help and the pain comes and goes. The last time she had a seizure was last march. She has been on the Depakote "forever". She was on Keppra for her pregnancy, she is not planning on having anymore kids and had a hysterectomy. Depakote works for her but the weight gain  is significant and she would like  to try a different AED. Discussed risks including breakthrough seizures, discussed choices, Topamax is a good AED but often needs higher doses that can cause side effects. Lamictal does not work for her.   DGL:OVFIEPP Poindexteris a 36 y.o.femalehere as a referral from Dr. Karlton Lemon MD for right leg pain and radiculopathy. Ongoing since 2015 no inciting events or trauma. She has had ESI which initially helped a little but not working anymore. She is having difficulty with weight gain since she cannot work out especially on Depakote for her seizure disorder. She can't work out because of the right leg weakness and the pain. She has leg pain with radicular symptoms into the right leg with numbness of the foot. Foot numbness since August. The pain starts in the upper right leg and radiates down the side and back of the thigh and to the lateral lower leg and into the foot. Her leg gives out, weak. Most painful when in a position for a long time such as sitting in one position for a long time. At one point there was so much pain she couldn't even sit. Muscle relaxers don't help, hydrocodone help a little bit but not on it now and doesn't want to take it , hard to sleep because she wakes with pain. Her right foot is numb in the toes and in the lateral aspect of the foot. She can' t feel the bottom of her heel, continuously numb, she can;t drive for long periods. She gets  cramps in the right foot. No changes in bowel or bladder.   Reviewed notes, labs and imaging from outside physicians, which showed:  BUN 8, creatinine 0.65 05/2014, TSH 1.552 10/2011  MRI lumbar spine 04/2016: personally reviewed images and agree with the following  Disc levels:  L1-2: Negative.  L2-3: Normal interspace. No disc bulge or disc protrusion. Left-sided facet arthrosis. No canal or foraminal stenosis.  L3-4: Negative.  L4-5: Mild diffuse degenerative disc bulge with  disc desiccation, stable. No focal disc herniation. Mild bilateral facet arthrosis, right worse than left. No canal stenosis. Mild bilateral foraminal narrowing, stable.  L5-S1: Negative. Aic 6 IMPRESSION: 1. Shallow broad-based disc protrusion at L4-5 with resultant mild bilateral foraminal narrowing, stable from prior. No other significant degenerative disc disease. No new focal disc herniation. 2. Mild facet arthrosis at L2-3 and L4-5 as above.  Review of Systems: Patient complains of symptoms per HPI as well as the following symptoms: swelling in legs, anemia, joint pain, cramps, aching muscles, headache, numbness, weakness, restless legs. Pertinent negatives per HPI. All others negative.   Social History   Social History  . Marital status: Single    Spouse name: N/A  . Number of children: 1  . Years of education: Some College   Occupational History  . [employer]    Social History Main Topics  . Smoking status: Never Smoker  . Smokeless tobacco: Never Used  . Alcohol use 0.0 oz/week     Comment: Once a month  . Drug use: No  . Sexual activity: Not on file   Other Topics Concern  . Not on file   Social History Narrative   Lives with herself and son   Caffeine use: none   Right-handed    Family History  Problem Relation Age of Onset  . Diabetes Mother   . Hyperlipidemia Father   . Cancer Maternal Grandfather   . Hyperlipidemia Paternal Grandfather   . Diabetes Maternal Grandmother   . Anesthesia problems Neg Hx   . Hypotension Neg Hx   . Malignant hyperthermia Neg Hx   . Pseudochol deficiency Neg Hx     Past Medical History:  Diagnosis Date  . Anemia   . Anemia, iron deficiency 04/10/2012  . Constipation   . Depression 04/26/13   history - no current problems  . Diabetes mellitus    gestational DM 2013 pregnancy-resolved  . Generalized headaches   . Hemorrhoids   . History of blood transfusion 02/2013   [facility]  2 units transfused   . Nausea &  vomiting    resolved after delivery - C/S, extreme  . Rectal bleeding   . Rectal pain   . Seasonal allergies   . Seizures ([place])    last seizure was  08/2012  . Thalassemia   . Weakness    resolved after C/S delivery    Past Surgical History:  Procedure Laterality Date  . ABDOMINAL HYSTERECTOMY    . CARPAL TUNNEL RELEASE  01/2009   right  . CESAREAN SECTION  12/31/2011   Procedure: CESAREAN SECTION;  Surgeon: Jolayne Haines, MD;  Location: [facility];  Service: Gynecology;  Laterality: N/A;  . DILATION AND CURETTAGE OF UTERUS     endometrial ablation  . EVALUATION UNDER ANESTHESIA WITH HEMORRHOIDECTOMY N/A 05/31/2013   Procedure: EXAM UNDER ANESTHESIA WITH EXTERNAL HEMORRHOIDECTOMY;  Surgeon: Adin Hector, MD;  Location: [facility];  Service: General;  Laterality: N/A;  . LABIOPLASTY  03/27/2012   Procedure: LABIAPLASTY;  Surgeon: Jolayne Haines, MD;  Location: [facility];  Service: Gynecology;  Laterality: N/A;  labia  . LAPAROSCOPIC TUBAL LIGATION  03/27/2012   Procedure: LAPAROSCOPIC TUBAL LIGATION;  Surgeon: Jolayne Haines, MD;  Location: [facility];  Service: Gynecology;  Laterality: Bilateral;  fallopian tubes caurtery  . LAPAROSCOPY     removal of cyst  . ROBOTIC ASSISTED TOTAL HYSTERECTOMY N/A 05/02/2013   Procedure: ROBOTIC ASSISTED TOTAL HYSTERECTOMY;  Surgeon: Marvene Staff, MD;  Location: [facility];  Service: Gynecology;  Laterality: N/A;  . TONGUE SURGERY    . TRANSANAL HEMORRHOIDAL DEARTERIALIZATION N/A 05/31/2013   Procedure: [place] HEMORRHOIDAL LIGATION/PEXY;  Surgeon: Adin Hector, MD;  Location: [facility];  Service: General;  Laterality: N/A;  . TUBAL LIGATION    . UNILATERAL SALPINGECTOMY Right 05/02/2013   Procedure: UNILATERAL SALPINGECTOMY;  Surgeon: Marvene Staff, MD;  Location: [facility];  Service: Gynecology;  Laterality: Right;  . WISDOM TOOTH EXTRACTION      Current Outpatient Prescriptions  Medication Sig Dispense Refill  . ALPRAZolam (XANAX) 1 MG tablet TK 1 T  PO TID PRN  2  . divalproex (DEPAKOTE ER) 500 MG 24 hr tablet Take 500 mg by mouth 2 (two) times daily.     . fluticasone (FLONASE) 50 MCG/ACT nasal spray Place 1 spray into the nose daily as needed for allergies.     . folic acid (FOLVITE) 1 MG tablet Take 2 tablets (2 mg total) by mouth daily. 60 tablet 6  . gabapentin (NEURONTIN) 300 MG capsule Take 1 capsule (300 mg total) by mouth 3 (three) times daily. 90 capsule 11  . ibuprofen (ADVIL,MOTRIN) 800 MG tablet Take 1 tablet (800 mg total) by mouth every 8 (eight) hours as needed. 21 tablet 0  . polyethylene glycol (MIRALAX / GLYCOLAX) packet Take 17 g by mouth daily as needed for severe constipation. 30 each 2  . Topiramate ER (TROKENDI XR) 200 MG CP24 Take 200 mg by mouth at bedtime. 60 capsule 11  . traMADol (ULTRAM) 50 MG tablet Take 1-2 tablet (50-100 mg total) by mouth every 6 (six) hours as needed. 30 tablet 4  . vitamin C (ASCORBIC ACID) 500 MG tablet Take 500 mg by mouth daily.     No current facility-administered medications for this visit.     Allergies as of 11/09/2016 - Review Complete 11/03/2016  Allergen Reaction Noted  . Latex Itching, Swelling, and Rash 05/24/2011  . Keflex [cephalexin] Hives 03/01/2013    Vitals: BP 137/84   Pulse (!) 101   Wt 258 lb (117 kg)   LMP 04/25/2013   BMI 44.29 kg/m  Last Weight:  Wt Readings from Last 1 Encounters:  11/09/16 258 lb (117 kg)   Last Height:   Ht Readings from Last 1 Encounters:  09/27/16 5\' 4"  (1.626 m)     Physical exam: Exam: Gen: NAD, conversant, well nourised, obese, well groomed  CV: RRR, no MRG. No Carotid Bruits. No peripheral edema, warm, nontender Eyes: Conjunctivae clear without exudates or hemorrhage  Neuro: Detailed Neurologic Exam  Speech: Speech is normal; fluent and spontaneous with normal comprehension.  Cognition: The patient is oriented to person, place, and time;  recent and remote memory intact;    language fluent;  normal attention, concentration,  fund of knowledge Cranial Nerves: The pupils are equal, round, and reactive to light. The fundi are normal and spontaneous venous pulsations are present. Visual fields are full to finger confrontation. Extraocular movements are intact. Trigeminal sensation  is intact and the muscles of mastication are normal. The face is symmetric. The palate elevates in the midline. Hearing intact. Voice is normal. Shoulder shrug is normal. The tongue has normal motion without fasciculations.   Coordination: Normal finger to nose and heel to shin. Normal rapid alternating movements.   Gait: Heel-toe and tandem gait are normal.   Motor Observation: No asymmetry, no atrophy, and no involuntary movements noted. Tone: Normal muscle tone.   Posture: Posture is normal. normal erect  Strength:  weakness right hip flexion, DF and leg flexion, inversion and eversion otherwise strength is V/V in the upper and lower limbs.   Sensation: decreased in an L5 distribution of the right leg  Reflex Exam:  DTR's: Possibly decreased reflex on the right AJ  Toes: The toes are downgoing bilaterally.  Clonus: Clonus is absent.  Assessment/Plan:36 year old patient with likely L5 radiculopathy. She has weakness right leg flexion, dorsiflexion and inversion and eversion of the right foot, she has radicular pain and decreased sensation in an L5 distribution. She [facility]. Also with seizure disorder. On Depakote but wants a different agent. She has tried and failed Lamictal. Depakote causing significant weight gain. Advised that switching AEDs could cause breakthrough seizures and side effects but she still would like to proceed.    - We tried Topiramate and now she is very tired and losing hair, crying in the office today. She endorses she is taking the Topiramate and last took it last night and  asked if we could fill out disability forms for her to stay out of work due to the side effects of Topiramate. I will get a Topiramate level, if she has been taking it I will write her out of work for the rest of the week. Will stop Topiramate and Continue Depakote. In a few months if she feels better we may try to switch to Keppra(did she fail keppra?) as Depakote is causing significant weight gain. Stop Topiramate.If levels come back 0 then I will not write her out of work, discussion was based on the fact that this could be side effects of Topiramate and she was taking Topiramate.  - If she doesn't feel better in several weeks will order a sleep study evaluation  - She is also severely anemic and not taking her folate, was seen at hematology recently. This could be the cause of her fatigue and if so follow up with hematology.  - I will write her out for the next 3 days, patient insists she needs disability filled out. This is on the condition she has been taking the Topiramate as this can cause depression and hair loss. If levels come back zero, when she has endorsed taking the medication and symptoms being side effects, I will not fill out her disability.  Remember to drink plenty of fluid, eat healthy meals and do not skip any meals. Try to eat protein with a every meal and eat a healthy snack such as fruit or nuts in between meals. Try to keep a regular sleep-wake schedule and try to exercise daily, particularly in the form of walking, 20-30 minutes a day, if you can.   I would like to see you back in 2 months, sooner if we need to. Please call us with any interim questions, concerns, problems, updates or refill requests.     Cc: Karlton Lemon MD A  Martyn Malay, MD  [facility] [address] [place], [place]  [address]  Phone [phone] Fax [phone]  A total of 25 minutes was spent face-to-face with this patient. Over half this time was spent  on counseling patient on the seizure diagnosis and topiramate side effects diagnosis and different diagnostic and therapeutic options available.

## 2016-11-10 ENCOUNTER — Encounter (HOSPITAL_COMMUNITY): Payer: Self-pay

## 2016-11-10 ENCOUNTER — Inpatient Hospital Stay (HOSPITAL_COMMUNITY)
Admission: AD | Admit: 2016-11-10 | Discharge: 2016-11-10 | Disposition: A | Discharge status: Home / Self Care | Payer: 59 | Source: Ambulatory Visit | Attending: Obstetrics and Gynecology | Admitting: Obstetrics and Gynecology

## 2016-11-10 DIAGNOSIS — Z91040 Latex allergy status: Secondary | ICD-10-CM | POA: Diagnosis not present

## 2016-11-10 DIAGNOSIS — Z79899 Other long term (current) drug therapy: Secondary | ICD-10-CM | POA: Diagnosis not present

## 2016-11-10 DIAGNOSIS — R569 Unspecified convulsions: Secondary | ICD-10-CM | POA: Insufficient documentation

## 2016-11-10 DIAGNOSIS — R8290 Unspecified abnormal findings in urine: Secondary | ICD-10-CM | POA: Diagnosis not present

## 2016-11-10 DIAGNOSIS — Z881 Allergy status to other antibiotic agents status: Secondary | ICD-10-CM | POA: Insufficient documentation

## 2016-11-10 DIAGNOSIS — Z8632 Personal history of gestational diabetes: Secondary | ICD-10-CM | POA: Diagnosis not present

## 2016-11-10 DIAGNOSIS — Z833 Family history of diabetes mellitus: Secondary | ICD-10-CM | POA: Insufficient documentation

## 2016-11-10 DIAGNOSIS — Z90710 Acquired absence of both cervix and uterus: Secondary | ICD-10-CM | POA: Insufficient documentation

## 2016-11-10 DIAGNOSIS — D569 Thalassemia, unspecified: Secondary | ICD-10-CM | POA: Insufficient documentation

## 2016-11-10 LAB — URINALYSIS, ROUTINE W REFLEX MICROSCOPIC
Bilirubin Urine: NEGATIVE
Glucose, UA: NEGATIVE mg/dL
Hgb urine dipstick: NEGATIVE
Ketones, ur: 5 mg/dL — AB
Leukocytes, UA: NEGATIVE
Nitrite: NEGATIVE
Protein, ur: NEGATIVE mg/dL
Specific Gravity, Urine: 1.018 (ref 1.005–1.030)
pH: 6 (ref 5.0–8.0)

## 2016-11-10 NOTE — MAU Provider Note (Signed)
History     No chief complaint on file.  CC: I have an infection somewhere 36 yo G3P1 SBF presents to MAU requesting evaluation and reassurance regarding labs done on 3/21 at hematologist and neurologist. Per pt, she was told her wbc was elevated and that she had an infection some where. She reports calling her PCP ( Dr Delfina Redwood office ) to be seen but have not heard back. She did not know the actual lab result. She denied any fever, URI, GI sx. Notes urine was cloudy but denied urinary odor or frequency. Hx notable for hysterectomy. No vaginal d/c OB History    Gravida Para Term Preterm AB Living   3 1 0 1 2 1    SAB TAB Ectopic Multiple Live Births   1 1 0 0 1      Past Medical History:  Diagnosis Date  . Anemia   . Anemia, iron deficiency 04/10/2012  . Constipation   . Depression 04/26/13   history - no current problems  . Diabetes mellitus    gestational DM 2013 pregnancy-resolved  . Generalized headaches   . Hemorrhoids   . History of blood transfusion 02/2013   [facility]  2 units transfused   . Nausea & vomiting    resolved after delivery - C/S, extreme  . Rectal bleeding   . Rectal pain   . Seasonal allergies   . Seizures ([place])    last seizure was  08/2012  . Thalassemia   . Weakness    resolved after C/S delivery    Past Surgical History:  Procedure Laterality Date  . ABDOMINAL HYSTERECTOMY    . CARPAL TUNNEL RELEASE  01/2009   right  . CESAREAN SECTION  12/31/2011   Procedure: CESAREAN SECTION;  Surgeon: Jolayne Haines, MD;  Location: [facility];  Service: Gynecology;  Laterality: N/A;  . DILATION AND CURETTAGE OF UTERUS     endometrial ablation  . EVALUATION UNDER ANESTHESIA WITH HEMORRHOIDECTOMY N/A 05/31/2013   Procedure: EXAM UNDER ANESTHESIA WITH EXTERNAL HEMORRHOIDECTOMY;  Surgeon: Adin Hector, MD;  Location: [facility];  Service: General;  Laterality: N/A;  . LABIOPLASTY  03/27/2012   Procedure: LABIAPLASTY;  Surgeon: Jolayne Haines, MD;  Location: [facility];   Service: Gynecology;  Laterality: N/A;  labia  . LAPAROSCOPIC TUBAL LIGATION  03/27/2012   Procedure: LAPAROSCOPIC TUBAL LIGATION;  Surgeon: Jolayne Haines, MD;  Location: [facility];  Service: Gynecology;  Laterality: Bilateral;  fallopian tubes caurtery  . LAPAROSCOPY     removal of cyst  . ROBOTIC ASSISTED TOTAL HYSTERECTOMY N/A 05/02/2013   Procedure: ROBOTIC ASSISTED TOTAL HYSTERECTOMY;  Surgeon: Marvene Staff, MD;  Location: [facility];  Service: Gynecology;  Laterality: N/A;  . TONGUE SURGERY    . TRANSANAL HEMORRHOIDAL DEARTERIALIZATION N/A 05/31/2013   Procedure: [place] HEMORRHOIDAL LIGATION/PEXY;  Surgeon: Adin Hector, MD;  Location: [facility];  Service: General;  Laterality: N/A;  . TUBAL LIGATION    . UNILATERAL SALPINGECTOMY Right 05/02/2013   Procedure: UNILATERAL SALPINGECTOMY;  Surgeon: Marvene Staff, MD;  Location: [facility];  Service: Gynecology;  Laterality: Right;  . WISDOM TOOTH EXTRACTION      Family History  Problem Relation Age of Onset  . Diabetes Mother   . Hyperlipidemia Father   . Cancer Maternal Grandfather   . Hyperlipidemia Paternal Grandfather   . Diabetes Maternal Grandmother   . Anesthesia problems Neg Hx   . Hypotension Neg Hx   . Malignant hyperthermia Neg Hx   .  Pseudochol deficiency Neg Hx     Social History  Substance Use Topics  . Smoking status: Never Smoker  . Smokeless tobacco: Never Used  . Alcohol use 0.0 oz/week     Comment: Once a month    Allergies:  Allergies  Allergen Reactions  . Latex Itching, Swelling and Rash    Where touched.  . Keflex [Cephalexin] Hives    Prescriptions Prior to Admission  Medication Sig Dispense Refill Last Dose  . ALPRAZolam (XANAX) 1 MG tablet Take 1 mg by mouth 3 (three) times daily as needed for anxiety.   Past Week at Unknown time  . divalproex (DEPAKOTE ER) 500 MG 24 hr tablet Take 1,000 mg by mouth 2 (two) times daily.    11/10/2016 at Unknown time  . fluticasone (FLONASE) 50 MCG/ACT nasal  spray Place 1 spray into the nose daily as needed for allergies.    Past Month at Unknown time  . folic acid (FOLVITE) 1 MG tablet Take 2 tablets (2 mg total) by mouth daily. 60 tablet 6 11/10/2016 at Unknown time  . gabapentin (NEURONTIN) 300 MG capsule Take 1 capsule (300 mg total) by mouth 3 (three) times daily. (Patient taking differently: Take 300 mg by mouth 3 (three) times daily as needed (pain). ) 90 capsule 11 11/09/2016 at Unknown time  . ibuprofen (ADVIL,MOTRIN) 800 MG tablet Take 1 tablet (800 mg total) by mouth every 8 (eight) hours as needed. (Patient taking differently: Take 800 mg by mouth every 8 (eight) hours as needed for moderate pain. ) 21 tablet 0 Past Month at Unknown time  . polyethylene glycol (MIRALAX / GLYCOLAX) packet Take 17 g by mouth daily as needed for severe constipation. 30 each 2 11/10/2016 at Unknown time  . traMADol (ULTRAM) 50 MG tablet Take 1-2 tablet (50-100 mg total) by mouth every 6 (six) hours as needed. (Patient taking differently: Take 50 mg by mouth every 6 (six) hours as needed for moderate pain. Take 1-2 tablet (50-100 mg total) by mouth every 6 (six) hours as needed.) 30 tablet 4 Past Month at Unknown time  . vitamin C (ASCORBIC ACID) 500 MG tablet Take 500 mg by mouth daily.   11/10/2016 at Unknown time  . Topiramate ER (TROKENDI XR) 200 MG CP24 Take 200 mg by mouth at bedtime. (Patient not taking: Reported on 11/10/2016) 60 capsule 11 Not Taking at Unknown time     Physical Exam   Blood pressure 118/71, pulse 92, temperature 98.7 F (37.1 C), temperature source Oral, resp. rate 18, height 5\' 4"  (1.626 m), weight 118.4 kg (261 lb), last menstrual period 04/25/2013, SpO2 100 %.  General appearance: alert, cooperative and no distress, obese Back: left CVAT. right nontender Abdomen: soft obese nontender (+) laparoscopy scar( robotic). Pfannenstiel  scar Pelvic: adnexae not palpable, external genitalia normal, no bladder tenderness, uterus surgically absent  and vagina normal without discharge Extremities: no edema, redness or tenderness in the calves or thighs  CBC Latest Ref Rng & Units 11/03/2016 09/23/2016 05/24/2014  WBC 3.9 - 10.0 10e3/uL 11.5(H) 6.9 7.6  Hemoglobin 11.6 - 15.9 g/dL 10.0(L) - 9.0(L)  Hematocrit 34.8 - 46.6 % 31.4(L) 29.3(L) 28.6(L)  Platelets 145 - 400 10e3/uL 339 297 304   ED Course  IMP: cloudy urine P) u/a, ucx sent. Advised pt her f/u needs to be with her PCP MDM   Marvene Staff, MD 6:35 PM 11/10/2016

## 2016-11-10 NOTE — MAU Note (Signed)
Pt has hx of kidney infections and urinary infection.  Pt was told she has an infection from her hematologist that she has some type of an infection going on.  "something is elevated".  Urine is cloudy.  Abd sometimes hurts.

## 2016-11-10 NOTE — Progress Notes (Addendum)
Hematologist provider informed pt last week that she had some type of infection from the lab work done in the office. Denies bleeding or vaginal discharge or abd. cramping. Just wants information as to the "infection" that hematologist informed.   Hx: hysterectomy in 2014. Kidney infection 2015 tx and hospitalized with blood transfusion at [place]: MD paged.  42 MD returned page. Report status of pt given.   1805: Provider at bs assessing pt.   1817: pelvic and abdominal exam done. Left flank pain  1826: Dr. Maudry Diego paged.  1836: Provider paged. Water given to pt.  1839: Provider returned page. Report status of urine given.   1840: Orders received to discharge pt home.  Discharge instructions given with pt understanding. Pt left unit via ambulatory.

## 2016-11-11 LAB — TOPIRAMATE LEVEL: Topiramate Lvl: 6.1 ug/mL (ref 2.0–25.0)

## 2016-11-11 LAB — VALPROIC ACID LEVEL: Valproic Acid Lvl: 29 ug/mL — ABNORMAL LOW (ref 50–100)

## 2016-11-12 LAB — URINE CULTURE

## 2016-11-16 NOTE — Telephone Encounter (Signed)
Pt called said she is still having trouble concentrating, fatigued, HA's, teary eyed-moods change. Said she feels depressed and still sleeping a lot. She has not been back to work either. Please call

## 2016-11-16 NOTE — Telephone Encounter (Signed)
Patient w/ lumbar radiculopathy and seizure d/o was seen by Dr. Jaynee Eagles 11/09/16 for OV. At previous visit, pt asked to be switched off of Depakote d/t weight gain and was started on topiramate. (She has tried Keppra and Lamictal has not worked for her in the past.) Last week, she c/o fatigue, weakness, depression/mood swings and hair loss after starting Trokendi. Dr. Jaynee Eagles agreed to write patient out of work for the rest of the week if her labs showed that she was taking the medication. Valproic acid level was low (29) but topiramate level was normal (6.1). She was recently started on folic acid as well for her thalassemia. Appears that she went to the ER on 11/10/16 for ?infection. Her WBCs were only slightly elevated (11.5) when checked by hematology 11/03/16. UA negative and pt d/c'd home, ambulatory. Even though topiramate was d/c'd last week, pt continues to complain today of depression, difficulty concentrating, crying spells, sleeping a lot and not able to go back to work.

## 2016-11-16 NOTE — Telephone Encounter (Signed)
Returned call to patient and let her know that work-in doctor agreed to write her out of work for 1 additional week. Says that her employer will fax over paperwork to be completed/signed since she is missing more than just a couple of days for illness. She's aware that she'll need to pay a form fee and sign a medical release form.

## 2016-11-16 NOTE — Telephone Encounter (Signed)
We can write her out for one more week

## 2016-11-22 ENCOUNTER — Telehealth: Payer: Self-pay | Admitting: Neurology

## 2016-11-22 NOTE — Telephone Encounter (Signed)
Letter faxed as requested

## 2016-11-22 NOTE — Telephone Encounter (Signed)
Returned pt TC. Letter completed and signed for pt to go back to work today. Copy mailed to pt's verified address. She will check w/ employer and call back if they also need a copy faxed to them. Voiced appreciation for call.

## 2016-11-22 NOTE — Telephone Encounter (Signed)
Pt call to provide   attention [place], please send note to Fax#[phone]

## 2016-11-22 NOTE — Telephone Encounter (Signed)
Patient called office requesting a note stating she is able to return to work today (patient working today).  If patient could be contacted once note if completed.  Please call

## 2016-11-23 ENCOUNTER — Ambulatory Visit: Payer: Managed Care, Other (non HMO) | Admitting: Neurology

## 2016-12-29 ENCOUNTER — Encounter: Payer: Self-pay | Admitting: Gynecology

## 2017-01-05 ENCOUNTER — Encounter: Payer: Self-pay | Admitting: Neurology

## 2017-01-05 ENCOUNTER — Ambulatory Visit (INDEPENDENT_AMBULATORY_CARE_PROVIDER_SITE_OTHER): Payer: 59 | Admitting: Neurology

## 2017-01-05 ENCOUNTER — Other Ambulatory Visit (HOSPITAL_BASED_OUTPATIENT_CLINIC_OR_DEPARTMENT_OTHER): Payer: 59

## 2017-01-05 ENCOUNTER — Ambulatory Visit (HOSPITAL_BASED_OUTPATIENT_CLINIC_OR_DEPARTMENT_OTHER): Payer: 59 | Admitting: Family

## 2017-01-05 VITALS — BP 126/75 | HR 78 | Temp 98.2°F | Resp 17 | Wt 267.4 lb

## 2017-01-05 VITALS — BP 128/68 | HR 85 | Ht 64.0 in | Wt 266.8 lb

## 2017-01-05 DIAGNOSIS — G40209 Localization-related (focal) (partial) symptomatic epilepsy and epileptic syndromes with complex partial seizures, not intractable, without status epilepticus: Secondary | ICD-10-CM | POA: Diagnosis not present

## 2017-01-05 DIAGNOSIS — D508 Other iron deficiency anemias: Secondary | ICD-10-CM

## 2017-01-05 DIAGNOSIS — D563 Thalassemia minor: Secondary | ICD-10-CM

## 2017-01-05 DIAGNOSIS — D509 Iron deficiency anemia, unspecified: Secondary | ICD-10-CM

## 2017-01-05 DIAGNOSIS — D561 Beta thalassemia: Secondary | ICD-10-CM | POA: Diagnosis not present

## 2017-01-05 DIAGNOSIS — D510 Vitamin B12 deficiency anemia due to intrinsic factor deficiency: Secondary | ICD-10-CM

## 2017-01-05 LAB — CBC WITH DIFFERENTIAL (CANCER CENTER ONLY)
BASO#: 0 10*3/uL (ref 0.0–0.2)
BASO%: 0.2 % (ref 0.0–2.0)
EOS%: 1.1 % (ref 0.0–7.0)
Eosinophils Absolute: 0.1 10*3/uL (ref 0.0–0.5)
HCT: 31 % — ABNORMAL LOW (ref 34.8–46.6)
HGB: 9.9 g/dL — ABNORMAL LOW (ref 11.6–15.9)
LYMPH#: 4.3 10*3/uL — ABNORMAL HIGH (ref 0.9–3.3)
LYMPH%: 43.6 % (ref 14.0–48.0)
MCH: 18.5 pg — ABNORMAL LOW (ref 26.0–34.0)
MCHC: 31.9 g/dL — ABNORMAL LOW (ref 32.0–36.0)
MCV: 58 fL — ABNORMAL LOW (ref 81–101)
MONO#: 0.6 10*3/uL (ref 0.1–0.9)
MONO%: 6 % (ref 0.0–13.0)
NEUT#: 4.9 10*3/uL (ref 1.5–6.5)
NEUT%: 49.1 % (ref 39.6–80.0)
Platelets: 327 10*3/uL (ref 145–400)
RBC: 5.36 10*6/uL — ABNORMAL HIGH (ref 3.70–5.32)
RDW: 17 % — ABNORMAL HIGH (ref 11.1–15.7)
WBC: 9.9 10*3/uL (ref 3.9–10.0)

## 2017-01-05 MED ORDER — PHENTERMINE HCL 37.5 MG PO CAPS: 37.5000 mg | ORAL_CAPSULE | ORAL | 3 refills | Status: DC

## 2017-01-05 NOTE — Progress Notes (Signed)
[facility]   Provider: Dr Jaynee Eagles  CC: Lumbar radiculopathy and seizure disorder  Interval history: Patient stopped the topiramate and she feels much better. Lamictal and Keppra did not help her in the past. We'll continue Depakote at this time. Her biggest concern is weight gain especially on the Depakote. Recommended a healthy weight and wellness Center. Discussed obesity. Discussed diet and exercise.  Interval History 11/09/2016: Patient returns for follow-up on seizure disorder. She has been on Depakote. At last appointment she wanted to switch agents due to weight gain side placed her on topiramate. She has been on Keppra in the past during pregnancy. Lamictal did not work for her in the past. 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 This could be the cause of her significant fatigue and weakness over the last month. Other causes can be medication side effects of topiramate or being on both topiramate and Depakote. Hair loss can be caused by topiramate. We can stop the Topiramate. She doesn't know if she is snoring. Just started over the last month. She has been missing work due to being so tired. Was hoping to switch Depakote for Topiramate but need to stop and see if this is causing side effects. Patient crying in the office today, requesting disability for this week for me, endorses multiple times that she is taking the topiramate. States states she last took topiramate last evening. Will check a Topiramate level.  Interval history 09/23/2016: She was evaluated by [facility] for right L5 radic and has a CT myelogram and three is no surgical intervention at this time there is no nerve root impingement. No Seizures. She is doing well on her Depakote and no seizures. Gabapentin did not help and the pain comes and goes.  The last time she had a seizure was last march. She has been on the Depakote "forever". She was on Keppra for her pregnancy, she is not planning on having anymore kids and had a hysterectomy. Depakote works for her but the weight gain is significant and she would like to try a different AED. Discussed risks including breakthrough seizures, discussed choices, Topamax is a good AED but often needs higher doses that can cause side effects. Lamictal does not work for her.   KNL:ZJQBHAL Poindexteris a 36 y.o.femalehere as a referral from Dr. Karlton Lemon MD for right leg pain and radiculopathy. Ongoing since 2015 no inciting events or trauma. She has had ESI which initially helped a little but not working anymore. She is having difficulty with weight gain since she cannot work out especially on Depakote for her seizure disorder. She can't work out because of the right leg weakness and the pain. She has leg pain with radicular symptoms into the right leg with numbness of the foot. Foot numbness since August. The pain starts in the upper right leg and radiates down the side and back of the thigh and to the lateral lower leg and into the foot. Her leg gives out, weak. Most painful when in a position for a long time such as sitting in one position for a long time. At one point there was so much pain she couldn't even sit. Muscle relaxers don't help, hydrocodone help a little bit but not on it now and doesn't want to take it , hard to sleep because she wakes with pain.  Her right foot is numb in the toes and in the lateral aspect of the foot. She can' t feel the bottom of her heel, continuously numb, she can;t drive for long periods. She gets cramps in the right foot. No changes in bowel or bladder.   Reviewed notes, labs and imaging from outside physicians, which showed:  BUN 8, creatinine 0.65 05/2014, TSH 1.552 10/2011  MRI lumbar spine 04/2016: personally reviewed images and agree with the  following  Disc levels:  L1-2: Negative.  L2-3: Normal interspace. No disc bulge or disc protrusion. Left-sided facet arthrosis. No canal or foraminal stenosis.  L3-4: Negative.  L4-5: Mild diffuse degenerative disc bulge with disc desiccation, stable. No focal disc herniation. Mild bilateral facet arthrosis, right worse than left. No canal stenosis. Mild bilateral foraminal narrowing, stable.  L5-S1: Negative. Aic 6 IMPRESSION: 1. Shallow broad-based disc protrusion at L4-5 with resultant mild bilateral foraminal narrowing, stable from prior. No other significant degenerative disc disease. No new focal disc herniation. 2. Mild facet arthrosis at L2-3 and L4-5 as above.  Review of Systems: Patient complains of symptoms per HPI as well as the following symptoms: swelling in legs, anemia, joint pain, cramps, aching muscles, headache, numbness, weakness, restless legs. Pertinent negatives per HPI. All others negative.  Review of Systems: Patient complains of symptoms per HPI as well as the following symptoms: swelling in legs, anemia, joint pain, cramps, aching muscles, headache, numbness, weakness, restless legs. Pertinent negatives per HPI. All others negative.  Social History   Social History  . Marital status: Single    Spouse name: N/A  . Number of children: 1  . Years of education: Some College   Occupational History  . [employer]    Social History Main Topics  . Smoking status: Never Smoker  . Smokeless tobacco: Never Used  . Alcohol use 0.0 oz/week     Comment: Once a month  . Drug use: No  . Sexual activity: Not on file   Other Topics Concern  . Not on file   Social History Narrative   Lives with herself and son   Caffeine use: none   Right-handed    Family History  Problem Relation Age of Onset  . Diabetes Mother   . Hyperlipidemia Father   . Cancer Maternal Grandfather   . Hyperlipidemia Paternal Grandfather   . Diabetes Maternal Grandmother    . Anesthesia problems Neg Hx   . Hypotension Neg Hx   . Malignant hyperthermia Neg Hx   . Pseudochol deficiency Neg Hx     Past Medical History:  Diagnosis Date  . Anemia   . Anemia, iron deficiency 04/10/2012  . Constipation   . Depression 04/26/13   history - no current problems  . Diabetes mellitus    gestational DM 2013 pregnancy-resolved  . Generalized headaches   . Hemorrhoids   . History of blood transfusion 02/2013   [facility]  2 units transfused   . Nausea & vomiting    resolved after delivery - C/S, extreme  . Rectal bleeding   . Rectal pain   . Seasonal allergies   . Seizures ([place])    last seizure was  08/2012  . Thalassemia   . Weakness    resolved after C/S delivery    Past Surgical History:  Procedure Laterality Date  . ABDOMINAL HYSTERECTOMY    . CARPAL TUNNEL RELEASE  01/2009   right  . CESAREAN SECTION  12/31/2011   Procedure: CESAREAN SECTION;  Surgeon:  Jolayne Haines, MD;  Location: [facility];  Service: Gynecology;  Laterality: N/A;  . DILATION AND CURETTAGE OF UTERUS     endometrial ablation  . EVALUATION UNDER ANESTHESIA WITH HEMORRHOIDECTOMY N/A 05/31/2013   Procedure: EXAM UNDER ANESTHESIA WITH EXTERNAL HEMORRHOIDECTOMY;  Surgeon: Adin Hector, MD;  Location: [facility];  Service: General;  Laterality: N/A;  . LABIOPLASTY  03/27/2012   Procedure: LABIAPLASTY;  Surgeon: Jolayne Haines, MD;  Location: [facility];  Service: Gynecology;  Laterality: N/A;  labia  . LAPAROSCOPIC TUBAL LIGATION  03/27/2012   Procedure: LAPAROSCOPIC TUBAL LIGATION;  Surgeon: Jolayne Haines, MD;  Location: [facility];  Service: Gynecology;  Laterality: Bilateral;  fallopian tubes caurtery  . LAPAROSCOPY     removal of cyst  . ROBOTIC ASSISTED TOTAL HYSTERECTOMY N/A 05/02/2013   Procedure: ROBOTIC ASSISTED TOTAL HYSTERECTOMY;  Surgeon: Marvene Staff, MD;  Location: [facility];  Service: Gynecology;  Laterality: N/A;  . TONGUE SURGERY    . TRANSANAL HEMORRHOIDAL DEARTERIALIZATION  N/A 05/31/2013   Procedure: [place] HEMORRHOIDAL LIGATION/PEXY;  Surgeon: Adin Hector, MD;  Location: [facility];  Service: General;  Laterality: N/A;  . TUBAL LIGATION    . UNILATERAL SALPINGECTOMY Right 05/02/2013   Procedure: UNILATERAL SALPINGECTOMY;  Surgeon: Marvene Staff, MD;  Location: [facility];  Service: Gynecology;  Laterality: Right;  . WISDOM TOOTH EXTRACTION      Current Outpatient Prescriptions  Medication Sig Dispense Refill  . ALPRAZolam (XANAX) 1 MG tablet Take 1 mg by mouth 3 (three) times daily as needed for anxiety.    . divalproex (DEPAKOTE ER) 500 MG 24 hr tablet Take 1,000 mg by mouth 2 (two) times daily.     . fluticasone (FLONASE) 50 MCG/ACT nasal spray Place 1 spray into the nose daily as needed for allergies.     . folic acid (FOLVITE) 1 MG tablet Take 2 tablets (2 mg total) by mouth daily. 60 tablet 6  . gabapentin (NEURONTIN) 300 MG capsule Take 1 capsule (300 mg total) by mouth 3 (three) times daily. (Patient taking differently: Take 300 mg by mouth 3 (three) times daily as needed (pain). ) 90 capsule 11  . ibuprofen (ADVIL,MOTRIN) 800 MG tablet Take 1 tablet (800 mg total) by mouth every 8 (eight) hours as needed. (Patient taking differently: Take 800 mg by mouth every 8 (eight) hours as needed for moderate pain. ) 21 tablet 0  . polyethylene glycol (MIRALAX / GLYCOLAX) packet Take 17 g by mouth daily as needed for severe constipation. 30 each 2  . traMADol (ULTRAM) 50 MG tablet Take 1-2 tablet (50-100 mg total) by mouth every 6 (six) hours as needed. (Patient taking differently: Take 50 mg by mouth every 6 (six) hours as needed for moderate pain. Take 1-2 tablet (50-100 mg total) by mouth every 6 (six) hours as needed.) 30 tablet 4  . vitamin C (ASCORBIC ACID) 500 MG tablet Take 500 mg by mouth daily.    . phentermine 37.5 MG capsule Take 1 capsule (37.5 mg total) by mouth every morning. 30 capsule 3   No current facility-administered medications for this visit.      Allergies as of 01/05/2017 - Review Complete 01/05/2017  Allergen Reaction Noted  . Latex Itching, Swelling, and Rash 05/24/2011  . Keflex [cephalexin] Hives 03/01/2013    Vitals: BP 128/68   Pulse 85   Ht 5\' 4"  (1.626 m)   Wt 266 lb 12.8 oz (121 kg)   LMP 04/25/2013  BMI 45.80 kg/m  Last Weight:  Wt Readings from Last 1 Encounters:  01/05/17 267 lb 6.4 oz (121.3 kg)   Last Height:   Ht Readings from Last 1 Encounters:  01/05/17 5\' 4"  (1.626 m)     Neuro: Detailed Neurologic Exam  Speech: Speech is normal; fluent and spontaneous with normal comprehension.  Cognition: The patient is oriented to person, place, and time;  recent and remote memory intact;  language fluent;  normal attention, concentration,  fund of knowledge Cranial Nerves: The pupils are equal, round, and reactive to light. The fundi are normal and spontaneous venous pulsations are present. Visual fields are full to finger confrontation. Extraocular movements are intact. Trigeminal sensation is intact and the muscles of mastication are normal. The face is symmetric. The palate elevates in the midline. Hearing intact. Voice is normal. Shoulder shrug is normal. The tongue has normal motion without fasciculations.   Coordination: Normal finger to nose and heel to shin. Normal rapid alternating movements.   Gait: Heel-toe and tandem gait are normal.   Motor Observation: No asymmetry, no atrophy, and no involuntary movements noted. Tone: Normal muscle tone.   Posture: Posture is normal. normal erect  Strength:  weakness right hip flexion, DF and leg flexion, inversion and eversion otherwise strength is V/V in the upper and lower limbs.   Sensation: decreased in an L5 distribution of the right leg  Reflex Exam:  DTR's: Possibly decreased reflex on the right AJ  Toes: The toes are downgoing bilaterally.  Clonus: Clonus is  absent.  Assessment/Plan:36 year old patient with likely L5 radiculopathy. She has weakness right leg flexion, dorsiflexion and inversion and eversion of the right foot, she has radicular pain and decreased sensation in an L5 distribution. She has followed at [facility] for this. Also with seizure disorder. On Depakote but wants a different agent. She has tried and failed Lamictal. Depakote causing significant weight gain. Advised that switching AEDs could cause breakthrough seizures. Switched to topiramate and patient became extremely fatigued, was losing her hair with emotional lability. Since stopping the topiramate she feels much better.   Remember to drink plenty of fluid, eat healthy meals and do not skip any meals. Try to eat protein with a every meal and eat a healthy snack such as fruit or nuts in between meals. Try to keep a regular sleep-wake schedule and try to exercise daily, particularly in the form of walking, 20-30 minutes a day, if you can.   I would like to see you back in 6 months, sooner if we need to. Please call us with any interim questions, concerns, problems, updates or refill requests.   Patient is morbidly obese. She is on Depakote which can cause weight gain. She's tried other medications without success at this point we'll keep her Depakote and discuss diet, exercise and also recommended the healthy weight and wellness Center.  Cc: Karlton Lemon MD A  Martyn Malay, MD  [facility] [address] [place], [place] [address]  Phone [phone] Fax [phone]  A total of 15 minutes was spent face-to-face with this patient. Over half this time was spent on counseling patient on the seizure diagnosis and topiramate side effects diagnosis and different diagnostic and therapeutic options available.

## 2017-01-05 NOTE — Patient Instructions (Addendum)
Remember to drink plenty of fluid, eat healthy meals and do not skip any meals. Try to eat protein with a every meal and eat a healthy snack such as fruit or nuts in between meals. Try to keep a regular sleep-wake schedule and try to exercise daily, particularly in the form of walking, 20-30 minutes a day, if you can.   As far as your medications are concerned, I would like to suggest: Continue Depakote  As far as diagnostic testing: [facility] [place] [phone]  I would like to see you back in 6 months, sooner if we need to. Please call us with any interim questions, concerns, problems, updates or refill requests.   Our phone number is [phone]. We also have an after hours call service for urgent matters and there is a physician on-call for urgent questions. For any emergencies you know to call 911 or go to the nearest emergency room  Phentermine tablets or capsules What is this medicine? PHENTERMINE (FEN ter meen) decreases your appetite. It is used with a reduced calorie diet and exercise to help you lose weight. This medicine may be used for other purposes; ask your health care provider or pharmacist if you have questions. COMMON BRAND NAME(S): Adipex-P, Atti-Plex P, Atti-Plex P Spansule, Fastin, Lomaira, Pro-Fast, Tara-8 What should I tell my health care provider before I take this medicine? They need to know if you have any of these conditions: -agitation -glaucoma -heart disease -high blood pressure -history of substance abuse -lung disease called Primary Pulmonary Hypertension (PPH) -taken an MAOI like Carbex, Eldepryl, Marplan, Nardil, or Parnate in last 14 days -thyroid disease -an unusual or allergic reaction to phentermine, other medicines, foods, dyes, or preservatives -pregnant or trying to get pregnant -breast-feeding How should I use this medicine? Take this medicine by mouth with a glass of water. Follow the directions on the prescription label. The  instructions for use may differ based on the product and dose you are taking. Avoid taking this medicine in the evening. It may interfere with sleep. Take your doses at regular intervals. Do not take your medicine more often than directed. Talk to your pediatrician regarding the use of this medicine in children. While this drug may be prescribed for children 17 years or older for selected conditions, precautions do apply. Overdosage: If you think you have taken too much of this medicine contact a poison control center or emergency room at once. NOTE: This medicine is only for you. Do not share this medicine with others. What if I miss a dose? If you miss a dose, take it as soon as you can. If it is almost time for your next dose, take only that dose. Do not take double or extra doses. What may interact with this medicine? Do not take this medicine with any of the following medications: -duloxetine -MAOIs like Carbex, Eldepryl, Marplan, Nardil, and Parnate -medicines for colds or breathing difficulties like pseudoephedrine or phenylephrine -procarbazine -sibutramine -SSRIs like citalopram, escitalopram, fluoxetine, fluvoxamine, paroxetine, and sertraline -stimulants like dexmethylphenidate, methylphenidate or modafinil -venlafaxine This medicine may also interact with the following medications: -medicines for diabetes This list may not describe all possible interactions. Give your health care provider a list of all the medicines, herbs, non-prescription drugs, or dietary supplements you use. Also tell them if you smoke, drink alcohol, or use illegal drugs. Some items may interact with your medicine. What should I watch for while using this medicine? Notify your physician immediately if you become short  of breath while doing your normal activities. Do not take this medicine within 6 hours of bedtime. It can keep you from getting to sleep. Avoid drinks that contain caffeine and try to stick to a  regular bedtime every night. This medicine was intended to be used in addition to a healthy diet and exercise. The best results are achieved this way. This medicine is only indicated for short-term use. Eventually your weight loss may level out. At that point, the drug will only help you maintain your new weight. Do not increase or in any way change your dose without consulting your doctor. You may get drowsy or dizzy. Do not drive, use machinery, or do anything that needs mental alertness until you know how this medicine affects you. Do not stand or sit up quickly, especially if you are an older patient. This reduces the risk of dizzy or fainting spells. Alcohol may increase dizziness and drowsiness. Avoid alcoholic drinks. What side effects may I notice from receiving this medicine? Side effects that you should report to your doctor or health care professional as soon as possible: -chest pain, palpitations -depression or severe changes in mood -increased blood pressure -irritability -nervousness or restlessness -severe dizziness -shortness of breath -problems urinating -unusual swelling of the legs -vomiting Side effects that usually do not require medical attention (report to your doctor or health care professional if they continue or are bothersome): -blurred vision or other eye problems -changes in sexual ability or desire -constipation or diarrhea -difficulty sleeping -dry mouth or unpleasant taste -headache -nausea This list may not describe all possible side effects. Call your doctor for medical advice about side effects. You may report side effects to FDA at 1-800-FDA-1088. Where should I keep my medicine? Keep out of the reach of children. This medicine can be abused. Keep your medicine in a safe place to protect it from theft. Do not share this medicine with anyone. Selling or giving away this medicine is dangerous and against the law. This medicine may cause accidental overdose  and death if taken by other adults, children, or pets. Mix any unused medicine with a substance like cat litter or coffee grounds. Then throw the medicine away in a sealed container like a sealed bag or a coffee can with a lid. Do not use the medicine after the expiration date. Store at room temperature between 20 and 25 degrees C (68 and 77 degrees F). Keep container tightly closed. NOTE: This sheet is a summary. It may not cover all possible information. If you have questions about this medicine, talk to your doctor, pharmacist, or health care provider.  2018 Elsevier/Gold Standard (2015-05-09 12:53:15)

## 2017-01-05 NOTE — Progress Notes (Signed)
Hematology and Oncology Follow Up Visit  Leinani Lisbon [id number] 10/27/1980 36 y.o. 01/05/2017   Principle Diagnosis:  Thalassemia minor (beta thalassemia) History of iron deficiency anemia  Current Therapy:   Folic acid 2 mg by mouth daily - has not been taking IV iron as indicated - last received in September 2014   Interim History:  Ms. Ricklefs is here today for follow-up. She is still having some mild fatigue at times. She is really hoping to have her gastric sleeve procedure.  She verbalized that she is taking her folic acid as prescribed. Her Hgb is still 9.9 with an MCV of 58. Her iron studies have been stable but we rechecked today.  No fever, chills, n/v, cough, rash, dizziness, SOB, chest pain, palpitations, abdominal pain or changes in bowel or bladder habits.  No episodes of bleeding, bruising or petechiae. No lymphadenopathy found on exam.  No swelling, tenderness, numbness or tingling in her extremities.  She has maintained a good appetite and is staying well hydrated. Her weight is stable.   ECOG Performance Status: 1 - Symptomatic but completely ambulatory  Medications:  Allergies as of 01/05/2017      Reactions   Latex Itching, Swelling, Rash   Where touched.   Keflex [cephalexin] Hives      Medication List       Accurate as of 01/05/17  4:36 PM. Always use your most recent med list.          ALPRAZolam 1 MG tablet Commonly known as:  XANAX Take 1 mg by mouth 3 (three) times daily as needed for anxiety.   divalproex 500 MG 24 hr tablet Commonly known as:  DEPAKOTE ER Take 1,000 mg by mouth 2 (two) times daily.   fluticasone 50 MCG/ACT nasal spray Commonly known as:  FLONASE Place 1 spray into the nose daily as needed for allergies.   folic acid 1 MG tablet Commonly known as:  FOLVITE Take 2 tablets (2 mg total) by mouth daily.   gabapentin 300 MG capsule Commonly known as:  NEURONTIN Take 1 capsule (300 mg total) by mouth 3 (three)  times daily.   ibuprofen 800 MG tablet Commonly known as:  ADVIL,MOTRIN Take 1 tablet (800 mg total) by mouth every 8 (eight) hours as needed.   phentermine 37.5 MG capsule Take 1 capsule (37.5 mg total) by mouth every morning.   polyethylene glycol packet Commonly known as:  MIRALAX / GLYCOLAX Take 17 g by mouth daily as needed for severe constipation.   traMADol 50 MG tablet Commonly known as:  ULTRAM Take 1-2 tablet (50-100 mg total) by mouth every 6 (six) hours as needed.   vitamin C 500 MG tablet Commonly known as:  ASCORBIC ACID Take 500 mg by mouth daily.       Allergies:  Allergies  Allergen Reactions  . Latex Itching, Swelling and Rash    Where touched.  . Keflex [Cephalexin] Hives    Past Medical History, Surgical history, Social history, and Family History were reviewed and updated.  Review of Systems: All other 10 point review of systems is negative.   Physical Exam:  weight is 267 lb 6.4 oz (121.3 kg). Her oral temperature is 98.2 F (36.8 C). Her blood pressure is 126/75 and her pulse is 78. Her respiration is 17 and oxygen saturation is 100%.   Wt Readings from Last 3 Encounters:  01/05/17 267 lb 6.4 oz (121.3 kg)  01/05/17 266 lb 12.8 oz (121 kg)  11/10/16 261  lb (118.4 kg)    Ocular: Sclerae unicteric, pupils equal, round and reactive to light Ear-nose-throat: Oropharynx clear, dentition fair Lymphatic: No cervical, supraclavicular or axillary adenopathy Lungs no rales or rhonchi, good excursion bilaterally Heart regular rate and rhythm, no murmur appreciated Abd soft, nontender, positive bowel sounds, no liver or spleen tip palpated on exam, no fluid wave MSK no focal spinal tenderness, no joint edema Neuro: non-focal, well-oriented, appropriate affect Breasts: Deferred   Lab Results  Component Value Date   WBC 9.9 01/05/2017   HGB 9.9 (L) 01/05/2017   HCT 31.0 (L) 01/05/2017   MCV 58 (L) 01/05/2017   PLT 327 01/05/2017   Lab Results    Component Value Date   FERRITIN 1,452 (H) 11/03/2016   IRON 68 11/03/2016   TIBC 281 11/03/2016   UIBC 214 11/03/2016   IRONPCTSAT 24 11/03/2016   Lab Results  Component Value Date   RETICCTPCT 4.0 (H) 05/24/2014   RBC 5.36 (H) 01/05/2017   RETICCTABS 202.8 (H) 05/24/2014   No results found for: KPAFRELGTCHN, LAMBDASER, KAPLAMBRATIO No results found for: IGGSERUM, IGA, IGMSERUM No results found for: Odetta Pink, SPEI   Chemistry      Component Value Date/Time   NA 138 09/23/2016 0944   K 4.5 09/23/2016 0944   CL 100 09/23/2016 0944   CO2 24 09/23/2016 0944   BUN 7 09/23/2016 0944   CREATININE 0.62 09/23/2016 0944      Component Value Date/Time   CALCIUM 9.1 09/23/2016 0944   ALKPHOS 73 09/23/2016 0944   AST 16 09/23/2016 0944   ALT 16 09/23/2016 0944   BILITOT 0.5 09/23/2016 0944      Impression and Plan: Ms. Hereford is a very pleasant 36 yo [name] with beta thalassemia and iron deficiency anemia. She is taking 2 mg PO folic acid daily.  Hgb is still 9.9 with an MCV of 58. We will see what her iron studies show and plan to bring her back in this week for infusion if needed.  I spoke with Dr. Marin Olp regarding her having the gastric sleeve procedure and feel that from our standpoint she is ok to have this done. Her counts are stable and unchanged from her baseline. With the beta thalassemia this will be her "normal." We will plan to see her back in 2 months for repeat lab work and follow-up.  She will contact our office with any questions or concerns. We can certainly see her sooner if need be.   [name], NP 5/23/20184:36 PM

## 2017-01-06 ENCOUNTER — Ambulatory Visit (HOSPITAL_BASED_OUTPATIENT_CLINIC_OR_DEPARTMENT_OTHER): Payer: 59

## 2017-01-06 ENCOUNTER — Other Ambulatory Visit: Payer: Self-pay | Admitting: Lab

## 2017-01-06 VITALS — BP 121/65 | Temp 98.2°F | Resp 18

## 2017-01-06 DIAGNOSIS — D509 Iron deficiency anemia, unspecified: Secondary | ICD-10-CM | POA: Diagnosis not present

## 2017-01-06 DIAGNOSIS — D563 Thalassemia minor: Secondary | ICD-10-CM

## 2017-01-06 DIAGNOSIS — D510 Vitamin B12 deficiency anemia due to intrinsic factor deficiency: Secondary | ICD-10-CM

## 2017-01-06 DIAGNOSIS — D508 Other iron deficiency anemias: Secondary | ICD-10-CM

## 2017-01-06 LAB — IRON AND TIBC
%SAT: 18 % — ABNORMAL LOW (ref 21–57)
Iron: 57 ug/dL (ref 41–142)
TIBC: 308 ug/dL (ref 236–444)
UIBC: 251 ug/dL (ref 120–384)

## 2017-01-06 LAB — FERRITIN: Ferritin: 1330 ng/ml — ABNORMAL HIGH (ref 9–269)

## 2017-01-06 LAB — RETICULOCYTES: Reticulocyte Count: 3 % — ABNORMAL HIGH (ref 0.6–2.6)

## 2017-01-06 MED ORDER — SODIUM CHLORIDE 0.9 % IV SOLN
510.0000 mg | Freq: Once | INTRAVENOUS | Status: AC
  Administered 2017-01-06: 510 mg via INTRAVENOUS
  Filled 2017-01-06: qty 17

## 2017-01-06 MED ORDER — SODIUM CHLORIDE 0.9 % IV SOLN
INTRAVENOUS | Status: DC
  Administered 2017-01-06: 15:00:00 via INTRAVENOUS

## 2017-01-06 NOTE — Patient Instructions (Signed)

## 2017-01-07 LAB — VITAMIN B12: Vitamin B12: 1617 pg/mL — ABNORMAL HIGH (ref 232–1245)

## 2017-01-12 ENCOUNTER — Encounter: Payer: Self-pay | Admitting: *Deleted

## 2017-02-24 ENCOUNTER — Telehealth: Payer: Self-pay | Admitting: Neurology

## 2017-02-24 NOTE — Telephone Encounter (Signed)
Pt is having a procedure done on 7-17 that will prevent her from being able to take the divalproex (DEPAKOTE ER) 500 MG 24 hr tablet full dosage, she would like a call back with what can be done since she will not be able to break the extended release form of the Depakote

## 2017-02-25 NOTE — Telephone Encounter (Signed)
Returned pt's call. She reports that she is having a gallbladder procedure and her gastroenterologist is recommending that she switch from extended-release to immediate release Depakote for 1 month d/t the digestive process of ER medicines.

## 2017-02-26 ENCOUNTER — Other Ambulatory Visit: Payer: Self-pay | Admitting: Neurology

## 2017-02-26 MED ORDER — DIVALPROEX SODIUM 500 MG PO DR TAB: 1000.0000 mg | DELAYED_RELEASE_TABLET | Freq: Two times a day (BID) | ORAL | 1 refills | Status: DC

## 2017-02-26 NOTE — Telephone Encounter (Signed)
That's fine I already called it in thanks!

## 2017-03-01 DIAGNOSIS — Z903 Acquired absence of stomach [part of]: Secondary | ICD-10-CM

## 2017-03-01 HISTORY — DX: Acquired absence of stomach (part of): Z90.3

## 2017-03-07 ENCOUNTER — Other Ambulatory Visit: Payer: 59

## 2017-03-07 ENCOUNTER — Ambulatory Visit: Payer: 59 | Admitting: Family

## 2017-03-11 ENCOUNTER — Other Ambulatory Visit (HOSPITAL_BASED_OUTPATIENT_CLINIC_OR_DEPARTMENT_OTHER): Payer: 59

## 2017-03-11 ENCOUNTER — Ambulatory Visit (HOSPITAL_BASED_OUTPATIENT_CLINIC_OR_DEPARTMENT_OTHER): Payer: 59 | Admitting: Hematology & Oncology

## 2017-03-11 VITALS — BP 121/65 | HR 88 | Temp 98.2°F | Resp 17 | Wt 248.0 lb

## 2017-03-11 DIAGNOSIS — D563 Thalassemia minor: Secondary | ICD-10-CM

## 2017-03-11 DIAGNOSIS — D561 Beta thalassemia: Secondary | ICD-10-CM

## 2017-03-11 DIAGNOSIS — D509 Iron deficiency anemia, unspecified: Secondary | ICD-10-CM

## 2017-03-11 DIAGNOSIS — Z9884 Bariatric surgery status: Secondary | ICD-10-CM | POA: Diagnosis not present

## 2017-03-11 DIAGNOSIS — D508 Other iron deficiency anemias: Secondary | ICD-10-CM

## 2017-03-11 DIAGNOSIS — D510 Vitamin B12 deficiency anemia due to intrinsic factor deficiency: Secondary | ICD-10-CM

## 2017-03-11 LAB — IRON AND TIBC
%SAT: 29 % (ref 21–57)
Iron: 80 ug/dL (ref 41–142)
TIBC: 279 ug/dL (ref 236–444)
UIBC: 199 ug/dL (ref 120–384)

## 2017-03-11 LAB — CBC WITH DIFFERENTIAL (CANCER CENTER ONLY)
BASO#: 0 10*3/uL (ref 0.0–0.2)
BASO%: 0.2 % (ref 0.0–2.0)
EOS%: 3 % (ref 0.0–7.0)
Eosinophils Absolute: 0.3 10*3/uL (ref 0.0–0.5)
HCT: 30.7 % — ABNORMAL LOW (ref 34.8–46.6)
HGB: 9.8 g/dL — ABNORMAL LOW (ref 11.6–15.9)
LYMPH#: 5 10*3/uL — ABNORMAL HIGH (ref 0.9–3.3)
LYMPH%: 54.8 % — ABNORMAL HIGH (ref 14.0–48.0)
MCH: 18.6 pg — ABNORMAL LOW (ref 26.0–34.0)
MCHC: 31.9 g/dL — ABNORMAL LOW (ref 32.0–36.0)
MCV: 58 fL — ABNORMAL LOW (ref 81–101)
MONO#: 0.6 10*3/uL (ref 0.1–0.9)
MONO%: 6.6 % (ref 0.0–13.0)
NEUT#: 3.2 10*3/uL (ref 1.5–6.5)
NEUT%: 35.4 % — ABNORMAL LOW (ref 39.6–80.0)
Platelets: 363 10*3/uL (ref 145–400)
RBC: 5.26 10*6/uL (ref 3.70–5.32)
RDW: 17 % — ABNORMAL HIGH (ref 11.1–15.7)
WBC: 9.1 10*3/uL (ref 3.9–10.0)

## 2017-03-11 LAB — FERRITIN: Ferritin: 2057 ng/ml — ABNORMAL HIGH (ref 9–269)

## 2017-03-11 NOTE — Progress Notes (Signed)
Hematology and Oncology Follow Up Visit  Abigail Hall [id number] 1981/08/06 36 y.o. 03/11/2017   Principle Diagnosis:  Thalassemia minor (beta thalassemia) History of iron deficiency anemia  Current Therapy:   Folic acid 2 mg by mouth daily - has not been taking IV iron as indicated - last received in May 2018   Interim History:  Abigail Hall is here today for follow-up. She really looks quite good. Remarkably, she had abdominal surgery 10 days ago. She had a gastric sleeve done. She already started to lose weight. Patient no problems with the surgery. I think it was outpatient surgery. She has laparoscopy scars area and she is a little bit worried that these may not heal up as well as her prior surgical scars. I tried to reassure her.  Her last iron studies done back in May showed a ferritin of 1330 milligrams. Her iron saturation was 18%. We did go ahead and give her a dose of iron.  She's had no bleeding. She's had no fever. She's had no rashes. She's had no cough or shortness of breath. There's been no nausea or vomiting.  Overall, her performance status is ECOG 0  Medications:  Allergies as of 03/11/2017      Reactions   Latex Itching, Swelling, Rash   Where touched.   Keflex [cephalexin] Hives      Medication List       Accurate as of 03/11/17  8:32 AM. Always use your most recent med list.          ALPRAZolam 1 MG tablet Commonly known as:  XANAX Take 1 mg by mouth 3 (three) times daily as needed for anxiety.   divalproex 500 MG DR tablet Commonly known as:  DEPAKOTE Take 2 tablets (1,000 mg total) by mouth 2 (two) times daily.   divalproex 500 MG 24 hr tablet Commonly known as:  DEPAKOTE ER Take 1,000 mg by mouth 2 (two) times daily.   fluticasone 50 MCG/ACT nasal spray Commonly known as:  FLONASE Place 1 spray into the nose daily as needed for allergies.   folic acid 1 MG tablet Commonly known as:  FOLVITE Take 2 tablets (2 mg total) by mouth  daily.   gabapentin 300 MG capsule Commonly known as:  NEURONTIN Take 1 capsule (300 mg total) by mouth 3 (three) times daily.   ibuprofen 800 MG tablet Commonly known as:  ADVIL,MOTRIN Take 1 tablet (800 mg total) by mouth every 8 (eight) hours as needed.   phentermine 37.5 MG capsule Take 1 capsule (37.5 mg total) by mouth every morning.   polyethylene glycol packet Commonly known as:  MIRALAX / GLYCOLAX Take 17 g by mouth daily as needed for severe constipation.   traMADol 50 MG tablet Commonly known as:  ULTRAM Take 1-2 tablet (50-100 mg total) by mouth every 6 (six) hours as needed.   vitamin C 500 MG tablet Commonly known as:  ASCORBIC ACID Take 500 mg by mouth daily.       Allergies:  Allergies  Allergen Reactions  . Latex Itching, Swelling and Rash    Where touched.  . Keflex [Cephalexin] Hives    Past Medical History, Surgical history, Social history, and Family History were reviewed and updated.  Review of Systems: All other 10 point review of systems is negative.   Physical Exam:  weight is 248 lb (112.5 kg). Her oral temperature is 98.2 F (36.8 C). Her blood pressure is 121/65 and her pulse is 88. Her respiration is  17 and oxygen saturation is 100%.   Wt Readings from Last 3 Encounters:  03/11/17 248 lb (112.5 kg)  01/05/17 267 lb 6.4 oz (121.3 kg)  01/05/17 266 lb 12.8 oz (121 kg)    Well-developed and well-nourished Afro-American female. She is moderately obese. Head and neck exam shows no ocular or oral lesions. There are no palpable cervical or supraclavicular lymph nodes. Lungs are clear laterally. Cardiac exam regular rate and rhythm with a normal S1 and S2 area and there are no murmurs, rubs or bruits. Abdomen is soft. She has good bowel sounds. She is obese. She has a laparoscopy scars that are healing. There is no erythema associated with these scars. There is no fluid wave. There is no palpable liver or spleen tip. Back exam shows no  tenderness over the spine, ribs or hips. Extremities shows no clubbing, cyanosis or edema. Neurological exam shows no focal neurological deficits. Skin exam shows no rashes, ecchymoses or petechia.   Lab Results  Component Value Date   WBC 9.1 03/11/2017   HGB 9.8 (L) 03/11/2017   HCT 30.7 (L) 03/11/2017   MCV 58 (L) 03/11/2017   PLT 363 03/11/2017   Lab Results  Component Value Date   FERRITIN 1,330 (H) 01/05/2017   IRON 57 01/05/2017   TIBC 308 01/05/2017   UIBC 251 01/05/2017   IRONPCTSAT 18 (L) 01/05/2017   Lab Results  Component Value Date   RETICCTPCT 4.0 (H) 05/24/2014   RBC 5.26 03/11/2017   RETICCTABS 202.8 (H) 05/24/2014   No results found for: KPAFRELGTCHN, LAMBDASER, KAPLAMBRATIO No results found for: IGGSERUM, IGA, IGMSERUM No results found for: Odetta Pink, SPEI   Chemistry      Component Value Date/Time   NA 138 09/23/2016 0944   K 4.5 09/23/2016 0944   CL 100 09/23/2016 0944   CO2 24 09/23/2016 0944   BUN 7 09/23/2016 0944   CREATININE 0.62 09/23/2016 0944      Component Value Date/Time   CALCIUM 9.1 09/23/2016 0944   ALKPHOS 73 09/23/2016 0944   AST 16 09/23/2016 0944   ALT 16 09/23/2016 0944   BILITOT 0.5 09/23/2016 0944      Impression and Plan: Abigail Hall is a very pleasant 36 yo [name] with beta thalassemia and iron deficiency anemia.   She is taking 2 mg PO folic acid daily.   She really looks good after her surgery. She had the gastric sleeve. This was just 10 days ago.  I suspect that she always will have some element of anemia. Her MCV I think is an indicator. We will see what her iron levels are. It would not surprise me if levels are okay.   I will plan to see her back in another 6 weeks.    [name], MD 7/27/20188:32 AM

## 2017-03-18 LAB — RETICULOCYTES: Reticulocyte Count: 3 % — ABNORMAL HIGH (ref 0.6–2.6)

## 2017-03-21 ENCOUNTER — Encounter: Payer: Self-pay | Admitting: Hematology & Oncology

## 2017-03-29 ENCOUNTER — Encounter (INDEPENDENT_AMBULATORY_CARE_PROVIDER_SITE_OTHER): Payer: 59

## 2017-04-21 ENCOUNTER — Encounter: Payer: Self-pay | Admitting: *Deleted

## 2017-04-21 ENCOUNTER — Ambulatory Visit (HOSPITAL_BASED_OUTPATIENT_CLINIC_OR_DEPARTMENT_OTHER): Payer: 59 | Admitting: Hematology & Oncology

## 2017-04-21 ENCOUNTER — Other Ambulatory Visit (HOSPITAL_BASED_OUTPATIENT_CLINIC_OR_DEPARTMENT_OTHER): Payer: 59

## 2017-04-21 VITALS — BP 105/56 | HR 67 | Temp 98.5°F | Wt 233.2 lb

## 2017-04-21 DIAGNOSIS — D561 Beta thalassemia: Secondary | ICD-10-CM

## 2017-04-21 DIAGNOSIS — D563 Thalassemia minor: Secondary | ICD-10-CM

## 2017-04-21 DIAGNOSIS — D508 Other iron deficiency anemias: Secondary | ICD-10-CM

## 2017-04-21 DIAGNOSIS — D509 Iron deficiency anemia, unspecified: Secondary | ICD-10-CM | POA: Diagnosis not present

## 2017-04-21 DIAGNOSIS — D500 Iron deficiency anemia secondary to blood loss (chronic): Secondary | ICD-10-CM

## 2017-04-21 LAB — CBC WITH DIFFERENTIAL (CANCER CENTER ONLY)
BASO#: 0 10*3/uL (ref 0.0–0.2)
BASO%: 0.2 % (ref 0.0–2.0)
EOS%: 2 % (ref 0.0–7.0)
Eosinophils Absolute: 0.1 10*3/uL (ref 0.0–0.5)
HCT: 30.5 % — ABNORMAL LOW (ref 34.8–46.6)
HGB: 9.8 g/dL — ABNORMAL LOW (ref 11.6–15.9)
LYMPH#: 2.3 10*3/uL (ref 0.9–3.3)
LYMPH%: 38.5 % (ref 14.0–48.0)
MCH: 18.7 pg — ABNORMAL LOW (ref 26.0–34.0)
MCHC: 32.1 g/dL (ref 32.0–36.0)
MCV: 58 fL — ABNORMAL LOW (ref 81–101)
MONO#: 0.4 10*3/uL (ref 0.1–0.9)
MONO%: 7.1 % (ref 0.0–13.0)
NEUT#: 3.2 10*3/uL (ref 1.5–6.5)
NEUT%: 52.2 % (ref 39.6–80.0)
Platelets: 282 10*3/uL (ref 145–400)
RBC: 5.25 10*6/uL (ref 3.70–5.32)
RDW: 16.6 % — ABNORMAL HIGH (ref 11.1–15.7)
WBC: 6 10*3/uL (ref 3.9–10.0)

## 2017-04-21 LAB — IRON AND TIBC
%SAT: 23 % (ref 21–57)
Iron: 54 ug/dL (ref 41–142)
TIBC: 240 ug/dL (ref 236–444)
UIBC: 186 ug/dL (ref 120–384)

## 2017-04-21 LAB — CMP (CANCER CENTER ONLY)
ALT(SGPT): 21 U/L (ref 10–47)
AST: 19 U/L (ref 11–38)
Albumin: 3.4 g/dL (ref 3.3–5.5)
Alkaline Phosphatase: 78 U/L (ref 26–84)
BUN, Bld: 9 mg/dL (ref 7–22)
CO2: 26 mEq/L (ref 18–33)
Calcium: 9.1 mg/dL (ref 8.0–10.3)
Chloride: 108 mEq/L (ref 98–108)
Creat: 0.9 mg/dl (ref 0.6–1.2)
Glucose, Bld: 122 mg/dL — ABNORMAL HIGH (ref 73–118)
Potassium: 4 mEq/L (ref 3.3–4.7)
Sodium: 138 mEq/L (ref 128–145)
Total Bilirubin: 0.8 mg/dl (ref 0.20–1.60)
Total Protein: 6.8 g/dL (ref 6.4–8.1)

## 2017-04-21 LAB — FERRITIN: Ferritin: 1225 ng/ml — ABNORMAL HIGH (ref 9–269)

## 2017-04-21 LAB — LACTATE DEHYDROGENASE: LDH: 141 U/L (ref 125–245)

## 2017-04-21 LAB — CHCC SATELLITE - SMEAR

## 2017-04-21 NOTE — Progress Notes (Signed)
Hematology and Oncology Follow Up Visit  Abigail Hall [id number] [birth year] 36 y.o. 04/21/2017   Principle Diagnosis:  Thalassemia minor (beta thalassemia) History of iron deficiency anemia  Current Therapy:   Folic acid 2 mg by mouth daily - has not been taking IV iron as indicated - last received in May 2018   Interim History:  Abigail Hall is here today for follow-up. She has recovered nicely from her gastric sleeve surgery. She had this back in July. She had no, occasions from this.  She is eating well. She is losing weight. She's lost 15 pounds since we last saw her.  Her son is now kindergarten. This has been an adjustment for Abigail Hall. He is enjoying himself.  Her iron studies back in July showed a ferritin of 2000 with iron saturation of only 29%.  She's had no fever. She's had no cough. His been no bleeding. She's had no change in bowel or bladder habits.  Overall, her performance status is ECOG 1.   Medications:  Allergies as of 04/21/2017      Reactions   Latex Itching, Swelling, Rash   Where touched.   Keflex [cephalexin] Hives      Medication List       Accurate as of 04/21/17  9:48 AM. Always use your most recent med list.          ALPRAZolam 1 MG tablet Commonly known as:  XANAX Take 1 mg by mouth 3 (three) times daily as needed for anxiety.   divalproex 500 MG DR tablet Commonly known as:  DEPAKOTE Take 2 tablets (1,000 mg total) by mouth 2 (two) times daily.   divalproex 500 MG 24 hr tablet Commonly known as:  DEPAKOTE ER Take 1,000 mg by mouth 2 (two) times daily.   fluticasone 50 MCG/ACT nasal spray Commonly known as:  FLONASE Place 1 spray into the nose daily as needed for allergies.   folic acid 1 MG tablet Commonly known as:  FOLVITE Take 2 tablets (2 mg total) by mouth daily.   gabapentin 300 MG capsule Commonly known as:  NEURONTIN Take 1 capsule (300 mg total) by mouth 3 (three) times daily.   ibuprofen 800 MG  tablet Commonly known as:  ADVIL,MOTRIN Take 1 tablet (800 mg total) by mouth every 8 (eight) hours as needed.   phentermine 37.5 MG capsule Take 1 capsule (37.5 mg total) by mouth every morning.   polyethylene glycol packet Commonly known as:  MIRALAX / GLYCOLAX Take 17 g by mouth daily as needed for severe constipation.   traMADol 50 MG tablet Commonly known as:  ULTRAM Take 1-2 tablet (50-100 mg total) by mouth every 6 (six) hours as needed.   vitamin C 500 MG tablet Commonly known as:  ASCORBIC ACID Take 500 mg by mouth daily.       Allergies:  Allergies  Allergen Reactions  . Latex Itching, Swelling and Rash    Where touched.  . Keflex [Cephalexin] Hives    Past Medical History, Surgical history, Social history, and Family History were reviewed and updated.  Review of Systems: As stated in the interim history  Physical Exam:  weight is 233 lb 4 oz (105.8 kg). Her oral temperature is 98.5 F (36.9 C). Her blood pressure is 105/56 (abnormal) and her pulse is 67.   Wt Readings from Last 3 Encounters:  04/21/17 233 lb 4 oz (105.8 kg)  03/11/17 248 lb (112.5 kg)  01/05/17 267 lb 6.4 oz (121.3 kg)  Obese African-American female. Head and neck exam shows no scleral icterus. There is no oral lesions. She has some enlarged tonsils. Her neck is supple with no adenopathy. Thyroid is non-palpable. Lungs are clear. Cardiac exam regular rate and rhythm with no murmurs rubs or bruits. Abdomen is soft. Abdomen is obese. She has a laparoscopy scar from her gastric sleeve. There is no splenomegaly. Extremities shows no clubbing, cyanosis or edema. She has good range of motion of her joints. Skin exam shows no rashes, ecchymoses or petechia. Neurological exam is nonfocal. .   Lab Results  Component Value Date   WBC 6.0 04/21/2017   HGB 9.8 (L) 04/21/2017   HCT 30.5 (L) 04/21/2017   MCV 58 (L) 04/21/2017   PLT 282 Large platelets present 04/21/2017   Lab Results  Component  Value Date   FERRITIN 2,057 (H) 03/11/2017   IRON 80 03/11/2017   TIBC 279 03/11/2017   UIBC 199 03/11/2017   IRONPCTSAT 29 03/11/2017   Lab Results  Component Value Date   RETICCTPCT 4.0 (H) 05/24/2014   RBC 5.25 04/21/2017   RETICCTABS 202.8 (H) 05/24/2014   No results found for: KPAFRELGTCHN, LAMBDASER, KAPLAMBRATIO No results found for: IGGSERUM, IGA, IGMSERUM No results found for: Odetta Pink, SPEI   Chemistry      Component Value Date/Time   NA 138 04/21/2017 0849   K 4.0 04/21/2017 0849   CL 108 04/21/2017 0849   CO2 26 04/21/2017 0849   BUN 9 04/21/2017 0849   CREATININE 0.9 04/21/2017 0849      Component Value Date/Time   CALCIUM 9.1 04/21/2017 0849   ALKPHOS 78 04/21/2017 0849   AST 19 04/21/2017 0849   ALT 21 04/21/2017 0849   BILITOT 0.80 04/21/2017 0849      Impression and Plan: Abigail Hall is a very pleasant 36 yo [name] with beta thalassemia and iron deficiency anemia.   From my point of view, she is doing well. I do think her hemoglobin will always be on the low side. I does don't think that there is much that we can really do to help with his. We don't have to because she is totally asymptomatic.  I will like to get her back in 2 or 3 months. I will like to see her back right around the holidays so that we can make sure her blood counts are well and she is able to enjoy the holidays with her son.  [name], MD 9/6/20189:48 AM

## 2017-04-22 LAB — HAPTOGLOBIN: Haptoglobin: 162 mg/dL (ref 34–200)

## 2017-04-22 LAB — RETICULOCYTES: Reticulocyte Count: 2.5 % (ref 0.6–2.6)

## 2017-05-17 ENCOUNTER — Telehealth: Payer: Self-pay | Admitting: Neurology

## 2017-05-18 NOTE — Telephone Encounter (Signed)
I spoke with patient and made her aware that she will need to get set up with a pain clinic for future Tramadol refills. She voiced understanding and stated that she had an upcoming OV with her PCP and would discuss this at that time. I have faxed the Rx to [facility].

## 2017-05-18 NOTE — Telephone Encounter (Signed)
I called patient but there was no answer. I left a voicemail asking her to call me back. Dr. Jaynee Eagles is giving her 1 more refill on her Tramadol, but she will need to get set up with a pain clinic for future refills.

## 2017-05-27 ENCOUNTER — Emergency Department (HOSPITAL_BASED_OUTPATIENT_CLINIC_OR_DEPARTMENT_OTHER): Payer: No Typology Code available for payment source

## 2017-05-27 ENCOUNTER — Emergency Department (HOSPITAL_BASED_OUTPATIENT_CLINIC_OR_DEPARTMENT_OTHER)
Admission: EM | Admit: 2017-05-27 | Discharge: 2017-05-27 | Disposition: A | Discharge status: Home / Self Care | Payer: No Typology Code available for payment source | Attending: Emergency Medicine | Admitting: Emergency Medicine

## 2017-05-27 ENCOUNTER — Encounter (HOSPITAL_BASED_OUTPATIENT_CLINIC_OR_DEPARTMENT_OTHER): Payer: Self-pay | Admitting: Emergency Medicine

## 2017-05-27 DIAGNOSIS — Y998 Other external cause status: Secondary | ICD-10-CM | POA: Insufficient documentation

## 2017-05-27 DIAGNOSIS — M549 Dorsalgia, unspecified: Secondary | ICD-10-CM | POA: Diagnosis not present

## 2017-05-27 DIAGNOSIS — Y929 Unspecified place or not applicable: Secondary | ICD-10-CM | POA: Diagnosis not present

## 2017-05-27 DIAGNOSIS — Y9389 Activity, other specified: Secondary | ICD-10-CM | POA: Insufficient documentation

## 2017-05-27 DIAGNOSIS — S161XXA Strain of muscle, fascia and tendon at neck level, initial encounter: Secondary | ICD-10-CM | POA: Insufficient documentation

## 2017-05-27 DIAGNOSIS — S8991XA Unspecified injury of right lower leg, initial encounter: Secondary | ICD-10-CM | POA: Diagnosis not present

## 2017-05-27 DIAGNOSIS — Z79899 Other long term (current) drug therapy: Secondary | ICD-10-CM | POA: Diagnosis not present

## 2017-05-27 DIAGNOSIS — Z91040 Latex allergy status: Secondary | ICD-10-CM | POA: Diagnosis not present

## 2017-05-27 DIAGNOSIS — S199XXA Unspecified injury of neck, initial encounter: Secondary | ICD-10-CM | POA: Diagnosis present

## 2017-05-27 DIAGNOSIS — S8001XA Contusion of right knee, initial encounter: Secondary | ICD-10-CM | POA: Diagnosis not present

## 2017-05-27 MED ORDER — CYCLOBENZAPRINE HCL 10 MG PO TABS: 10.0000 mg | ORAL_TABLET | Freq: Two times a day (BID) | ORAL | 0 refills | Status: DC | PRN

## 2017-05-27 MED FILL — CYCLOBENZAPRINE 10 MG TAB: 10 | 10 days supply | Qty: 20 | Fill #0
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
## 2017-05-27 NOTE — ED Notes (Signed)
Pt in XR at this time
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
## 2017-06-29 ENCOUNTER — Encounter: Payer: Self-pay | Admitting: Medical

## 2017-06-29 ENCOUNTER — Ambulatory Visit: Payer: 59 | Admitting: Medical

## 2017-06-29 VITALS — BP 101/62 | HR 70 | Temp 98.1°F | Resp 18 | Wt 218.4 lb

## 2017-06-29 DIAGNOSIS — J0100 Acute maxillary sinusitis, unspecified: Secondary | ICD-10-CM

## 2017-06-29 DIAGNOSIS — H6690 Otitis media, unspecified, unspecified ear: Secondary | ICD-10-CM | POA: Diagnosis not present

## 2017-06-29 DIAGNOSIS — R05 Cough: Secondary | ICD-10-CM | POA: Diagnosis not present

## 2017-06-29 DIAGNOSIS — R059 Cough, unspecified: Secondary | ICD-10-CM

## 2017-06-29 DIAGNOSIS — J029 Acute pharyngitis, unspecified: Secondary | ICD-10-CM

## 2017-06-29 LAB — POCT RAPID STREP A (OFFICE): Rapid Strep A Screen: NEGATIVE

## 2017-06-29 MED ORDER — BENZONATATE 100 MG PO CAPS: 100.0000 mg | ORAL_CAPSULE | Freq: Three times a day (TID) | ORAL | 0 refills | Status: DC | PRN

## 2017-06-29 MED ORDER — AMOXICILLIN-POT CLAVULANATE 875-125 MG PO TABS: 1.0000 | ORAL_TABLET | Freq: Two times a day (BID) | ORAL | 0 refills | Status: DC

## 2017-06-29 MED ORDER — FLUTICASONE PROPIONATE 50 MCG/ACT NA SUSP: 2.0000 | Freq: Every day | NASAL | 1 refills | Status: DC

## 2017-06-29 NOTE — Progress Notes (Signed)
Subjective:    Patient ID: Abigail Hall, female    DOB: [birth year], 36 y.o.   MRN: [id number]  HPI  Pt in for first time.  Pt has anemia and see Dr. Marin Olp. Will see him tomorrow.  Pt also has history of seizure. Last had in March, 2017. Pt seen Dr. Jaynee Eagles with [facility] neurology. Will see her in about 2 weeks.  Pt on phentermine rx'd by Dr. Jaynee Eagles to help her lose weight.  Pt has some back pain l4-l5 pain for bulging disk. She has tramadol for rare flares.  Pt works [employer], she walks 10,000 steps a day. No caffeine beverages. Single- one child.  Pt in sore throat for 3-4 days. Early on felt feverish. Mild body aches early on with st. Even today hurts to swallow. Some left ear pain. Some nasal congestion. Some sneezing. Pt thought maybe weather related change from allergies but her flonase was expired.  Pt does not get flu vaccines.  LMP- hysterectomy.  Pt reports with her work if she is out needs to be one week per work Act[employer].(so will make not available to use for tomorrow if she needs to use)  Past Medical History:  Diagnosis Date  . Anemia   . Anemia, iron deficiency 04/10/2012  . Constipation   . Depression 04/26/13   history - no current problems  . Generalized headaches   . Hemorrhoids   . History of blood transfusion 02/2013   [facility]  2 units transfused   . Nausea & vomiting    resolved after delivery - C/S, extreme  . Rectal bleeding   . Rectal pain   . Seasonal allergies   . Seizures ([place])    last seizure was  08/2012  . Thalassemia   . Weakness    resolved after C/S delivery     Social History   Socioeconomic History  . Marital status: Single    Spouse name: Not on file  . Number of children: 1  . Years of education: Some College  . Highest education level: Not on file  Social Needs  . Financial resource strain: Not on file  . Food insecurity - worry: Not on file  . Food insecurity - inability: Not on file  .  Transportation needs - medical: Not on file  . Transportation needs - non-medical: Not on file  Occupational History  . Occupation: [employer]  Tobacco Use  . Smoking status: Never Smoker  . Smokeless tobacco: Never Used  Substance and Sexual Activity  . Alcohol use: Yes    Alcohol/week: 0.0 oz    Comment: Once a month  . Drug use: No  . Sexual activity: Not on file  Other Topics Concern  . Not on file  Social History Narrative   Lives with herself and son   Caffeine use: none   Right-handed    Past Surgical History:  Procedure Laterality Date  . ABDOMINAL HYSTERECTOMY    . CARPAL TUNNEL RELEASE  01/2009   right  . DILATION AND CURETTAGE OF UTERUS     endometrial ablation  . LAPAROSCOPY     removal of cyst  . TONGUE SURGERY    . TUBAL LIGATION    . WISDOM TOOTH EXTRACTION      Family History  Problem Relation Age of Onset  . Diabetes Mother   . Hyperlipidemia Father   . Cancer Maternal Grandfather   . Hyperlipidemia Paternal Grandfather   . Diabetes Maternal Grandmother   .  Anesthesia problems Neg Hx   . Hypotension Neg Hx   . Malignant hyperthermia Neg Hx   . Pseudochol deficiency Neg Hx     Allergies  Allergen Reactions  . Latex Itching, Swelling and Rash    Where touched.  . Keflex [Cephalexin] Hives    Current Outpatient Medications on File Prior to Visit  Medication Sig Dispense Refill  . ALPRAZolam (XANAX) 1 MG tablet Take 1 mg by mouth 3 (three) times daily as needed for anxiety.    . cyclobenzaprine (FLEXERIL) 10 MG tablet Take 1 tablet (10 mg total) by mouth 2 (two) times daily as needed for muscle spasms. 20 tablet 0  . divalproex (DEPAKOTE) 500 MG DR tablet Take 2 tablets (1,000 mg total) by mouth 2 (two) times daily. 614 tablet 1  . folic acid (FOLVITE) 1 MG tablet Take 2 tablets (2 mg total) by mouth daily. 60 tablet 6  . phentermine 37.5 MG capsule Take 1 capsule (37.5 mg total) by mouth every morning. 30 capsule 3  . traMADol (ULTRAM) 50 MG tablet  TAKE 1 TO 2 TABLETS BY MOUTH EVERY 6 HOURS AS NEEDED 30 tablet 0  . vitamin C (ASCORBIC ACID) 500 MG tablet Take 500 mg by mouth daily.     No current facility-administered medications on file prior to visit.     BP 101/62 (BP Location: Left Arm, Patient Position: Sitting, Cuff Size: Large)   Pulse 70   Temp 98.1 F (36.7 C) (Oral)   Resp 18   Wt 218 lb 6.4 oz (99.1 kg)   LMP 04/25/2013   BMI 37.49 kg/m    Review of Systems  Constitutional: Negative for chills, fatigue and fever.  HENT: Positive for congestion, ear pain, postnasal drip, sinus pressure and sore throat. Negative for rhinorrhea.   Respiratory: Positive for cough. Negative for wheezing.   Cardiovascular: Negative for chest pain and palpitations.  Gastrointestinal: Negative for abdominal pain, diarrhea, nausea and vomiting.  Musculoskeletal: Negative for back pain and myalgias.  Skin: Negative for rash.  Neurological: Negative for dizziness, weakness, numbness and headaches.  Hematological: Negative for adenopathy. Does not bruise/bleed easily.  Psychiatric/Behavioral: Negative for behavioral problems, confusion, self-injury and suicidal ideas. The patient is not nervous/anxious.        Objective:   Physical Exam  General  Mental Status - Alert. General Appearance - Well groomed. Not in acute distress.  Skin Rashes- No Rashes.  HEENT Head- Normal. Ear Auditory Canal - Left- Normal. Right - Normal.Tympanic Membrane- Left-moderate red right- Normal. Eye Sclera/Conjunctiva- Left- Normal. Right- Normal. Nose & Sinuses Nasal Mucosa- Left-  Boggy and Congested. Right-  Boggy and  Congested.Bilateral  No maxillary sinus tenderness but left frontal sinus pressure/tenderness. Mouth & Throat Lips: Upper Lip- Normal: no dryness, cracking, pallor, cyanosis, or vesicular eruption. Lower Lip-Normal: no dryness, cracking, pallor, cyanosis or vesicular eruption. Buccal Mucosa- Bilateral- No Aphthous ulcers. Oropharynx-  No Discharge or Erythema. Tonsils: Characteristics- Bilateral-mild erythema. Size/Enlargement- Bilateral- No enlargement. Discharge- bilateral-None.  Neck Neck- Supple. No Masses.   Chest and Lung Exam Auscultation: Breath Sounds:-Clear even and unlabored.  Cardiovascular Auscultation:Rythm- Regular, rate and rhythm. Murmurs & Other Heart Sounds:Ausculatation of the heart reveal- No Murmurs.  Lymphatic Head & Neck General Head & Neck Lymphatics: Bilateral: Description- No Localized lymphadenopathy.       Assessment & Plan:  Your rapid strep test was negative but your left ear does look infected and your left frontal sinus area may be infected so rx'd augmentin antibiotic.  For cough rx benzonatate.  For hx of allergies and nasal congestion rx flonase.  You should gradually get better. But if symptoms worsen or change  Please let us know.  Work note written as you requested. Use if you feel you need tomorrow.  Follow up 7 days or as needed  Nikyah Lackman, Percell Miller, [employer]

## 2017-06-29 NOTE — Patient Instructions (Signed)
Your rapid strep test was negative but your left ear does look infected and your left frontal sinus area may be infected so rx'd augmentin antibiotic.  For cough rx benzonatate.  For hx of allergies and nasal congestion rx flonase.  You should gradually get better. But if symptoms worsen or change  Please let us know.  Work note written as you requested. Use if you feel you need tomorrow.  Follow up 7 days or as needed

## 2017-06-30 ENCOUNTER — Other Ambulatory Visit: Payer: 59

## 2017-06-30 ENCOUNTER — Ambulatory Visit: Payer: 59 | Admitting: Hematology & Oncology

## 2017-07-11 ENCOUNTER — Ambulatory Visit: Payer: 59 | Admitting: Neurology

## 2017-08-04 ENCOUNTER — Other Ambulatory Visit: Payer: Self-pay

## 2017-08-04 ENCOUNTER — Ambulatory Visit (HOSPITAL_BASED_OUTPATIENT_CLINIC_OR_DEPARTMENT_OTHER): Payer: 59 | Admitting: Hematology & Oncology

## 2017-08-04 ENCOUNTER — Encounter: Payer: Self-pay | Admitting: Hematology & Oncology

## 2017-08-04 ENCOUNTER — Telehealth: Payer: Self-pay | Admitting: *Deleted

## 2017-08-04 ENCOUNTER — Other Ambulatory Visit (HOSPITAL_BASED_OUTPATIENT_CLINIC_OR_DEPARTMENT_OTHER): Payer: 59

## 2017-08-04 VITALS — BP 144/65 | HR 70 | Temp 98.0°F | Resp 18 | Wt 207.0 lb

## 2017-08-04 DIAGNOSIS — Z9884 Bariatric surgery status: Secondary | ICD-10-CM

## 2017-08-04 DIAGNOSIS — D500 Iron deficiency anemia secondary to blood loss (chronic): Secondary | ICD-10-CM

## 2017-08-04 DIAGNOSIS — D561 Beta thalassemia: Secondary | ICD-10-CM

## 2017-08-04 DIAGNOSIS — D509 Iron deficiency anemia, unspecified: Secondary | ICD-10-CM

## 2017-08-04 DIAGNOSIS — D563 Thalassemia minor: Secondary | ICD-10-CM

## 2017-08-04 LAB — CBC WITH DIFFERENTIAL (CANCER CENTER ONLY)
BASO#: 0 10*3/uL (ref 0.0–0.2)
BASO%: 0.1 % (ref 0.0–2.0)
EOS%: 1.3 % (ref 0.0–7.0)
Eosinophils Absolute: 0.1 10*3/uL (ref 0.0–0.5)
HCT: 28.5 % — ABNORMAL LOW (ref 34.8–46.6)
HGB: 9.4 g/dL — ABNORMAL LOW (ref 11.6–15.9)
LYMPH#: 2.8 10*3/uL (ref 0.9–3.3)
LYMPH%: 41.6 % (ref 14.0–48.0)
MCH: 18.7 pg — ABNORMAL LOW (ref 26.0–34.0)
MCHC: 33 g/dL (ref 32.0–36.0)
MCV: 57 fL — ABNORMAL LOW (ref 81–101)
MONO#: 0.4 10*3/uL (ref 0.1–0.9)
MONO%: 6.6 % (ref 0.0–13.0)
NEUT#: 3.4 10*3/uL (ref 1.5–6.5)
NEUT%: 50.4 % (ref 39.6–80.0)
Platelets: 270 10*3/uL (ref 145–400)
RBC: 5.02 10*6/uL (ref 3.70–5.32)
RDW: 17 % — ABNORMAL HIGH (ref 11.1–15.7)
WBC: 6.7 10*3/uL (ref 3.9–10.0)

## 2017-08-04 LAB — CMP (CANCER CENTER ONLY)
ALT(SGPT): 14 U/L (ref 10–47)
AST: 16 U/L (ref 11–38)
Albumin: 3.4 g/dL (ref 3.3–5.5)
Alkaline Phosphatase: 63 U/L (ref 26–84)
BUN, Bld: 11 mg/dL (ref 7–22)
CO2: 26 mEq/L (ref 18–33)
Calcium: 9.4 mg/dL (ref 8.0–10.3)
Chloride: 108 mEq/L (ref 98–108)
Creat: 0.8 mg/dl (ref 0.6–1.2)
Glucose, Bld: 118 mg/dL (ref 73–118)
Potassium: 4 mEq/L (ref 3.3–4.7)
Sodium: 145 mEq/L (ref 128–145)
Total Bilirubin: 0.7 mg/dl (ref 0.20–1.60)
Total Protein: 6.6 g/dL (ref 6.4–8.1)

## 2017-08-04 LAB — IRON AND TIBC
%SAT: 25 % (ref 21–57)
Iron: 60 ug/dL (ref 41–142)
TIBC: 236 ug/dL (ref 236–444)
UIBC: 176 ug/dL (ref 120–384)

## 2017-08-04 LAB — FERRITIN: Ferritin: 1285 ng/ml — ABNORMAL HIGH (ref 9–269)

## 2017-08-04 LAB — LACTATE DEHYDROGENASE: LDH: 128 U/L (ref 125–245)

## 2017-08-04 MED ORDER — MOMETASONE FUROATE 0.1 % EX OINT: TOPICAL_OINTMENT | Freq: Every day | CUTANEOUS | 0 refills | Status: DC

## 2017-08-04 NOTE — Telephone Encounter (Addendum)
Patient is aware of results.   ----- Message from [name], MD sent at 08/04/2017 12:34 PM EST ----- Call - Iron level is ok!! Laurey Arrow

## 2017-08-04 NOTE — Progress Notes (Signed)
Hematology and Oncology Follow Up Visit  Abigail Hall [id number] 1981-04-30 36 y.o. 08/04/2017   Principle Diagnosis:  Thalassemia minor (beta thalassemia) History of iron deficiency anemia  Current Therapy:   Folic acid 2 mg by mouth daily - has not been taking IV iron as indicated - last received in May 2018   Interim History:  Abigail Hall is here today for follow-up. She has recovered nicely from her gastric sleeve surgery. She had this back in July. She had no, occasions from this.  She is eating well. She is losing weight. She's lost 15 pounds since we last saw her.  Her son is now kindergarten. This has been an adjustment for Abigail Hall. He is enjoying himself.  Her iron studies back in July showed a ferritin [id number] with iron saturation of only 25%.  She's had no fever. She's had no cough. His been no bleeding. She's had no change in bowel or bladder habits.  Overall, her performance status is ECOG 1.   Medications:  Allergies as of 08/04/2017      Reactions   Latex Itching, Swelling, Rash   Where touched.   Cephalexin Hives      Medication List        Accurate as of 08/04/17  9:01 AM. Always use your most recent med list.          ALPRAZolam 1 MG tablet Commonly known as:  XANAX Take 1 mg by mouth 3 (three) times daily as needed for anxiety.   amoxicillin-clavulanate 875-125 MG tablet Commonly known as:  AUGMENTIN Take 1 tablet 2 (two) times daily by mouth.   benzonatate 100 MG capsule Commonly known as:  TESSALON Take 1 capsule (100 mg total) 3 (three) times daily as needed by mouth for cough.   cyclobenzaprine 10 MG tablet Commonly known as:  FLEXERIL Take 1 tablet (10 mg total) by mouth 2 (two) times daily as needed for muscle spasms.   divalproex 500 MG DR tablet Commonly known as:  DEPAKOTE Take 2 tablets (1,000 mg total) by mouth 2 (two) times daily.   fluticasone 50 MCG/ACT nasal spray Commonly known as:  FLONASE Place 2  sprays daily into both nostrils.   folic acid 1 MG tablet Commonly known as:  FOLVITE Take 2 tablets (2 mg total) by mouth daily.   phentermine 37.5 MG capsule Take 1 capsule (37.5 mg total) by mouth every morning.   traMADol 50 MG tablet Commonly known as:  ULTRAM TAKE 1 TO 2 TABLETS BY MOUTH EVERY 6 HOURS AS NEEDED   vitamin C 500 MG tablet Commonly known as:  ASCORBIC ACID Take 500 mg by mouth daily.       Allergies:  Allergies  Allergen Reactions  . Latex Itching, Swelling and Rash    Where touched.  . Cephalexin Hives    Past Medical History, Surgical history, Social history, and Family History were reviewed and updated.  Review of Systems: As stated in the interim history  Physical Exam:  vitals were not taken for this visit.   Wt Readings from Last 3 Encounters:  06/29/17 218 lb 6.4 oz (99.1 kg)  05/27/17 215 lb (97.5 kg)  04/21/17 233 lb 4 oz (105.8 kg)    Physical Exam  Constitutional: She is oriented to person, place, and time.  HENT:  Head: Normocephalic and atraumatic.  Mouth/Throat: Oropharynx is clear and moist.  Eyes: EOM are normal. Pupils are equal, round, and reactive to light.  Neck: Normal range of motion.  Cardiovascular: Normal rate, regular rhythm and normal heart sounds.  Pulmonary/Chest: Effort normal and breath sounds normal.  Abdominal: Soft. Bowel sounds are normal.  Musculoskeletal: Normal range of motion. She exhibits no edema, tenderness or deformity.  Lymphadenopathy:    She has no cervical adenopathy.  Neurological: She is alert and oriented to person, place, and time.  Skin: Skin is warm and dry. No rash noted. No erythema.  Psychiatric: She has a normal mood and affect. Her behavior is normal. Judgment and thought content normal.  Vitals reviewed.    Lab Results  Component Value Date   WBC 6.7 08/04/2017   HGB 9.4 (L) 08/04/2017   HCT 28.5 (L) 08/04/2017   MCV 57 (L) 08/04/2017   PLT 270 08/04/2017   Lab Results    Component Value Date   FERRITIN 1,225 (H) 04/21/2017   IRON 54 04/21/2017   TIBC 240 04/21/2017   UIBC 186 04/21/2017   IRONPCTSAT 23 04/21/2017   Lab Results  Component Value Date   RETICCTPCT 4.0 (H) 05/24/2014   RBC 5.02 08/04/2017   RETICCTABS 202.8 (H) 05/24/2014   No results found for: KPAFRELGTCHN, LAMBDASER, KAPLAMBRATIO No results found for: IGGSERUM, IGA, IGMSERUM No results found for: Odetta Pink, SPEI   Chemistry      Component Value Date/Time   NA 138 04/21/2017 0849   K 4.0 04/21/2017 0849   CL 108 04/21/2017 0849   CO2 26 04/21/2017 0849   BUN 9 04/21/2017 0849   CREATININE 0.9 04/21/2017 0849      Component Value Date/Time   CALCIUM 9.1 04/21/2017 0849   ALKPHOS 78 04/21/2017 0849   AST 19 04/21/2017 0849   ALT 21 04/21/2017 0849   BILITOT 0.80 04/21/2017 0849      Impression and Plan: Abigail Hall is a very pleasant 36 yo [name] with beta thalassemia and iron deficiency anemia.   From my point of view, she is doing well. I do think her hemoglobin will always be on the low side. I just don't think that there is much that we can really do to help with his. We don't have to do anything because she is totally asymptomatic.  I will like to get her back in 2 or 3 months. I will like to see her back right around the holidays so that we can make sure her blood counts are well and she is able to enjoy the holidays with her son.  [name], MD 12/20/20189:01 AM

## 2017-08-05 LAB — RETICULOCYTES: Reticulocyte Count: 2.2 % (ref 0.6–2.6)

## 2017-08-08 LAB — HEMOGLOBINOPATHY EVALUATION
HGB C: 0 %
HGB S: 0 %
HGB VARIANT: 0 %
Hemoglobin A2 Quantitation: 4.8 % — ABNORMAL HIGH (ref 1.8–3.2)
Hemoglobin F Quantitation: 2.9 % — ABNORMAL HIGH (ref 0.0–2.0)
Hgb A: 92.3 % — ABNORMAL LOW (ref 96.4–98.8)

## 2017-08-15 ENCOUNTER — Ambulatory Visit: Payer: Self-pay | Admitting: *Deleted

## 2017-08-15 NOTE — Telephone Encounter (Signed)
Pt having complaints of urinary frequency and urgency that started on Saturday. Pt also states she notices a pink tinge on the toilet paper and in the toilet. Pt having complaints of feeling pressure after urinating. No appts available for the pt to be seen today, due to the offices closing at 12. Pt notified that she could use an evisit, which would be $30, but the pt states it would be cheaper to come in for a visit. Pt advised to go to Urgent Care to treat current symptoms. Pt verbalized understanding.  Reason for Disposition . Urinating more frequently than usual (i.e., frequency)  Answer Assessment - Initial Assessment Questions 1. SYMPTOM: "What's the main symptom you're concerned about?" (e.g., frequency, incontinence)     Frequency, urgency 2. ONSET: "When did the  ________  start?"     Saturday 3. PAIN: "Is there any pain?" If so, ask: "How bad is it?" (Scale: 1-10; mild, moderate, severe)     Pain 7-8 4. CAUSE: "What do you think is causing the symptoms?"     UTI 5. OTHER SYMPTOMS: "Do you have any other symptoms?" (e.g., fever, flank pain, blood in urine, pain with urination)    Pink tinge on toilet paper and in toilet 6. PREGNANCY: "Is there any chance you are pregnant?" "When was your last menstrual period?"     No, pt has hysterectomy  Protocols used: URINARY [facility]

## 2017-08-30 ENCOUNTER — Encounter: Payer: Self-pay | Admitting: Neurology

## 2017-08-30 ENCOUNTER — Ambulatory Visit: Payer: 59 | Admitting: Neurology

## 2017-08-30 VITALS — BP 109/66 | HR 69 | Ht 64.0 in | Wt 205.0 lb

## 2017-08-30 DIAGNOSIS — G8929 Other chronic pain: Secondary | ICD-10-CM

## 2017-08-30 DIAGNOSIS — M5440 Lumbago with sciatica, unspecified side: Secondary | ICD-10-CM

## 2017-08-30 DIAGNOSIS — B350 Tinea barbae and tinea capitis: Secondary | ICD-10-CM

## 2017-08-30 DIAGNOSIS — G40409 Other generalized epilepsy and epileptic syndromes, not intractable, without status epilepticus: Secondary | ICD-10-CM | POA: Diagnosis not present

## 2017-08-30 MED ORDER — KETOCONAZOLE 2 % EX SHAM: 1.0000 "application " | MEDICATED_SHAMPOO | CUTANEOUS | 11 refills | Status: DC

## 2017-08-30 MED ORDER — FLUOCINOLONE ACETONIDE SCALP 0.01 % EX OIL: 1.0000 "application " | TOPICAL_OIL | Freq: Every day | CUTANEOUS | 11 refills | Status: DC | PRN

## 2017-08-30 NOTE — Progress Notes (Signed)
[facility]    Provider:  Dr Jaynee Eagles Referring Provider: No ref. provider found Primary Care Physician:  Patient, No Pcp Per  CC: Lumbar radiculopathy and seizure disorder  Interval history: Patient stopped the topiramate and she feels much better. Lamictal and Keppra did not help her in the past. We'll continue Depakote at this time. Her biggest concern is weight gain especially on the Depakote. Recommended a healthy weight and wellness Center. Discussed obesity. Discussed diet and exercise. She has scalp psoriasis, can give her some shampoo but recommend pcp or derm.  Interval History 11/09/2016: Patient returns for follow-up on seizure disorder. She has been on Depakote. At last appointment she wanted to switch agents due to weight gain side placed her on topiramate. She has been on Keppra in the past during pregnancy. Lamictal did not work for her in the past. 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 This could be the cause of her significant fatigue and weakness over the last month. Other causes can be medication side effects of topiramate or being on both topiramate and Depakote. Hair loss can be caused by topiramate. We can stop the Topiramate. She doesn't know if she is snoring. Just started over the last month. She has been missing work due to being so tired. Was hoping to switch Depakote for Topiramate but need to stop and see if this is causing side effects. Patient crying in the office today, requesting disability for this week for me, endorses multiple times that she is taking the topiramate. States states she last took topiramate last evening. Will check a Topiramate level.  Interval history 09/23/2016: She was evaluated by [facility] for right L5 radic and has a CT myelogram and three is no surgical  intervention at this time there is no nerve root impingement. No Seizures. She is doing well on her Depakote and no seizures. Gabapentin did not help and the pain comes and goes. The last time she had a seizure was last march. She has been on the Depakote "forever". She was on Keppra for her pregnancy, she is not planning on having anymore kids and had a hysterectomy. Depakote works for her but the weight gain is significant and she would like to try a different AED. Discussed risks including breakthrough seizures, discussed choices, Topamax is a good AED but often needs higher doses that can cause side effects. Lamictal does not work for her.   IEP:PIRJJOA Poindexteris a 37 y.o.femalehere as a referral from Dr. Karlton Lemon MD for right leg pain and radiculopathy. Ongoing since 2015 no inciting events or trauma. She has had ESI which initially helped a little but not working anymore. She is having difficulty with weight gain since she cannot work out especially on Depakote for her seizure disorder. She can't work out because of the right leg weakness and the pain. She has leg pain with radicular symptoms into the right leg with numbness of the foot. Foot numbness since August. The pain starts in the upper right leg and radiates down the side and back of the thigh and to the lateral lower leg and into the foot. Her leg gives out, weak. Most painful when in a position for a long time such as sitting in one position for a long time. At one point there was so much pain she couldn't even  sit. Muscle relaxers don't help, hydrocodone help a little bit but not on it now and doesn't want to take it , hard to sleep because she wakes with pain. Her right foot is numb in the toes and in the lateral aspect of the foot. She can' t feel the bottom of her heel, continuously numb, she can;t drive for long periods. She gets cramps in the right foot. No changes in bowel or bladder.   Reviewed notes, labs and imaging  from outside physicians, which showed:  BUN 8, creatinine 0.65 05/2014, TSH 1.552 10/2011  MRI lumbar spine 04/2016: personally reviewed images and agree with the following  Disc levels:  L1-2: Negative.  L2-3: Normal interspace. No disc bulge or disc protrusion. Left-sided facet arthrosis. No canal or foraminal stenosis.  L3-4: Negative.  L4-5: Mild diffuse degenerative disc bulge with disc desiccation, stable. No focal disc herniation. Mild bilateral facet arthrosis, right worse than left. No canal stenosis. Mild bilateral foraminal narrowing, stable.  L5-S1: Negative. Aic 6 IMPRESSION: 1. Shallow broad-based disc protrusion at L4-5 with resultant mild bilateral foraminal narrowing, stable from prior. No other significant degenerative disc disease. No new focal disc herniation. 2. Mild facet arthrosis at L2-3 and L4-5 as above.  Review of Systems: Patient complains of symptoms per HPI as well as the following symptoms: swelling in legs, anemia, joint pain, cramps, aching muscles, headache, numbness, weakness, restless legs. Pertinent negatives per HPI. All others negative.  Review of Systems: Patient complains of symptoms per HPI as well as the following symptoms: swelling in legs, anemia, joint pain, cramps, aching muscles, headache, numbness, weakness, restless legs. Pertinent negatives per HPI. All others negative     Social History   Socioeconomic History  . Marital status: Single    Spouse name: Not on file  . Number of children: 1  . Years of education: Some College  . Highest education level: Not on file  Social Needs  . Financial resource strain: Not on file  . Food insecurity - worry: Not on file  . Food insecurity - inability: Not on file  . Transportation needs - medical: Not on file  . Transportation needs - non-medical: Not on file  Occupational History  . Occupation: [employer]  Tobacco Use  . Smoking status: Never Smoker  . Smokeless  tobacco: Never Used  Substance and Sexual Activity  . Alcohol use: Yes    Alcohol/week: 0.0 oz    Comment: Once a month  . Drug use: No  . Sexual activity: Not on file  Other Topics Concern  . Not on file  Social History Narrative   Lives at home with her son   Caffeine use: none   Right-handed    Family History  Problem Relation Age of Onset  . Diabetes Mother   . Hyperlipidemia Father   . Cancer Maternal Grandfather   . Hyperlipidemia Paternal Grandfather   . Diabetes Maternal Grandmother   . Anesthesia problems Neg Hx   . Hypotension Neg Hx   . Malignant hyperthermia Neg Hx   . Pseudochol deficiency Neg Hx     Past Medical History:  Diagnosis Date  . Anemia   . Anemia, iron deficiency 04/10/2012  . Constipation   . Depression 04/26/13   history - no current problems  . Generalized headaches   . Hemorrhoids   . History of blood transfusion 02/2013   [facility]  2 units transfused   . Nausea & vomiting    resolved after delivery -  C/S, extreme  . Rectal bleeding   . Rectal pain   . Seasonal allergies   . Seizures ([place])    last seizure was  08/2012  . Thalassemia   . Weakness    resolved after C/S delivery    Past Surgical History:  Procedure Laterality Date  . ABDOMINAL HYSTERECTOMY    . CARPAL TUNNEL RELEASE  01/2009   right  . CESAREAN SECTION  12/31/2011   Procedure: CESAREAN SECTION;  Surgeon: Jolayne Haines, MD;  Location: [facility];  Service: Gynecology;  Laterality: N/A;  . DILATION AND CURETTAGE OF UTERUS     endometrial ablation  . EVALUATION UNDER ANESTHESIA WITH HEMORRHOIDECTOMY N/A 05/31/2013   Procedure: EXAM UNDER ANESTHESIA WITH EXTERNAL HEMORRHOIDECTOMY;  Surgeon: Adin Hector, MD;  Location: [facility];  Service: General;  Laterality: N/A;  . LABIOPLASTY  03/27/2012   Procedure: LABIAPLASTY;  Surgeon: Jolayne Haines, MD;  Location: [facility];  Service: Gynecology;  Laterality: N/A;  labia  . LAPAROSCOPIC TUBAL LIGATION  03/27/2012   Procedure:  LAPAROSCOPIC TUBAL LIGATION;  Surgeon: Jolayne Haines, MD;  Location: [facility];  Service: Gynecology;  Laterality: Bilateral;  fallopian tubes caurtery  . LAPAROSCOPY     removal of cyst  . ROBOTIC ASSISTED TOTAL HYSTERECTOMY N/A 05/02/2013   Procedure: ROBOTIC ASSISTED TOTAL HYSTERECTOMY;  Surgeon: Marvene Staff, MD;  Location: [facility];  Service: Gynecology;  Laterality: N/A;  . TONGUE SURGERY    . TRANSANAL HEMORRHOIDAL DEARTERIALIZATION N/A 05/31/2013   Procedure: [place] HEMORRHOIDAL LIGATION/PEXY;  Surgeon: Adin Hector, MD;  Location: [facility];  Service: General;  Laterality: N/A;  . TUBAL LIGATION    . UNILATERAL SALPINGECTOMY Right 05/02/2013   Procedure: UNILATERAL SALPINGECTOMY;  Surgeon: Marvene Staff, MD;  Location: [facility];  Service: Gynecology;  Laterality: Right;  . WISDOM TOOTH EXTRACTION      Current Outpatient Medications  Medication Sig Dispense Refill  . ALPRAZolam (XANAX) 1 MG tablet Take 1 mg by mouth 3 (three) times daily as needed for anxiety.    Marland Kitchen amoxicillin-clavulanate (AUGMENTIN) 875-125 MG tablet Take 1 tablet 2 (two) times daily by mouth. 20 tablet 0  . benzonatate (TESSALON) 100 MG capsule Take 1 capsule (100 mg total) 3 (three) times daily as needed by mouth for cough. 21 capsule 0  . cyclobenzaprine (FLEXERIL) 10 MG tablet Take 1 tablet (10 mg total) by mouth 2 (two) times daily as needed for muscle spasms. 20 tablet 0  . divalproex (DEPAKOTE) 500 MG DR tablet Take 2 tablets (1,000 mg total) by mouth 2 (two) times daily. 120 tablet 1  . fluticasone (FLONASE) 50 MCG/ACT nasal spray Place 2 sprays daily into both nostrils. 16 g 1  . folic acid (FOLVITE) 1 MG tablet Take 2 tablets (2 mg total) by mouth daily. 60 tablet 6  . mometasone (ELOCON) 0.1 % ointment Apply topically daily. 15 g 0  . phentermine 37.5 MG capsule Take 1 capsule (37.5 mg total) by mouth every morning. 30 capsule 3  . traMADol (ULTRAM) 50 MG tablet TAKE 1 TO 2 TABLETS BY MOUTH EVERY 6  HOURS AS NEEDED 30 tablet 0  . vitamin C (ASCORBIC ACID) 500 MG tablet Take 500 mg by mouth daily.     No current facility-administered medications for this visit.     Allergies as of 08/30/2017 - Review Complete 08/30/2017  Allergen Reaction Noted  . Latex Itching, Swelling, and Rash 05/24/2011  . Cephalexin Hives 02/28/2013    Vitals:  BP 109/66 (BP Location: Right Arm, Patient Position: Sitting)   Pulse 69   Ht 5\' 4"  (1.626 m)   Wt 205 lb (93 kg)   LMP 04/25/2013   BMI 35.19 kg/m  Last Weight:  Wt Readings from Last 1 Encounters:  08/30/17 205 lb (93 kg)   Last Height:   Ht Readings from Last 1 Encounters:  08/30/17 5\' 4"  (1.626 m)       Neuro: Detailed Neurologic Exam  Speech: Speech is normal; fluent and spontaneous with normal comprehension.  Cognition: The patient is oriented to person, place, and time;  recent and remote memory intact;  language fluent;  normal attention, concentration,  fund of knowledge Cranial Nerves: The pupils are equal, round, and reactive to light. The fundi are normal and spontaneous venous pulsations are present. Visual fields are full to finger confrontation. Extraocular movements are intact. Trigeminal sensation is intact and the muscles of mastication are normal. The face is symmetric. The palate elevates in the midline. Hearing intact. Voice is normal. Shoulder shrug is normal. The tongue has normal motion without fasciculations.   Coordination: Normal finger to nose and heel to shin. Normal rapid alternating movements.   Gait: Heel-toe and tandem gait are normal.   Motor Observation: No asymmetry, no atrophy, and no involuntary movements noted. Tone: Normal muscle tone.   Posture: Posture is normal. normal erect  Strength:  weakness right hip flexion, DF and leg flexion, inversion and eversion otherwise strength is V/V in the upper and lower limbs.   Sensation: decreased in  an L5 distribution of the right leg  Reflex Exam:  DTR's: Possibly decreased reflex on the right AJ  Toes: The toes are downgoing bilaterally.  Clonus: Clonus is absent.  Assessment/Plan:37 year old patient with likely L5 radiculopathy. She has weakness right leg flexion, dorsiflexion and inversion and eversion of the right foot, she has radicular pain and decreased sensation in an L5 distribution. She has followed at [facility] for this. Also with seizure disorder. On Depakote but wants a different agent. She has tried and failed Lamictal. Depakote causing significant weight gain. Advised that switching AEDs could cause breakthrough seizures. Switched to topiramate and patient became extremely fatigued, was losing her hair with emotional lability. Since stopping the topiramate she feels much better.  Continue depakote, stable seizure disorder LBP improved with weight loss  I would like to see you back in 1 year, sooner if we need to. Please call us with any interim questions, concerns, problems, updates or refill requests.   Scalp dermatitis: ketoconazole shampoo and fluocinolone scalp oil  Abigail Ill, MD  [facility] [address] [place], [place] [address]  Phone [phone] Fax [phone]  A total of 15 minutes was spent face-to-face with this patient. Over half this time was spent on counseling patient on the epilepsy, LBP, scalp dermatitis  diagnosis and different diagnostic and therapeutic options available.   greater than 50% of the visit was spent in counseling and coordination of care  Even stable problems: CHF:  Remains stable today  .Diagnostic results, impressions, or recommended diagnostic studies, .Prognosis, .Risks and benefits of management (treatment) options, .Instructions for management (treatment) or follow-up, .Importance of compliance with chosen management (treatment) options, .Risk factor  reduction, .Patient and family education

## 2017-08-30 NOTE — Patient Instructions (Signed)
1 year follow up

## 2017-08-31 ENCOUNTER — Other Ambulatory Visit: Payer: Self-pay | Admitting: Medical

## 2017-09-02 NOTE — Telephone Encounter (Signed)
Patient wanted refill of her Flonase.  I have only seen her one time.  In epic it states patient does not have a PCP.  I am not sure why it states that.  Since I saw the patient only once and I do not have any recent clinical information regarding her current illness would recommend that she be seen.  Would you investigate this?  Does patient think I am her PCP?  It looks on review of my note in November it was basically a new patient visit.  Let me know you find out.

## 2017-09-02 NOTE — Telephone Encounter (Signed)
Pt is requesting refill on Flonase but pt has no PCP

## 2017-10-26 ENCOUNTER — Other Ambulatory Visit: Payer: Self-pay | Admitting: Neurology

## 2017-11-03 ENCOUNTER — Inpatient Hospital Stay: Payer: 59

## 2017-11-03 ENCOUNTER — Inpatient Hospital Stay: Payer: 59 | Attending: Hematology & Oncology | Admitting: Family

## 2017-11-03 ENCOUNTER — Other Ambulatory Visit: Payer: Self-pay

## 2017-11-03 ENCOUNTER — Encounter: Payer: Self-pay | Admitting: Family

## 2017-11-03 VITALS — BP 119/63 | HR 67 | Temp 98.1°F | Resp 16 | Wt 195.0 lb

## 2017-11-03 DIAGNOSIS — D510 Vitamin B12 deficiency anemia due to intrinsic factor deficiency: Secondary | ICD-10-CM

## 2017-11-03 DIAGNOSIS — R5383 Other fatigue: Secondary | ICD-10-CM | POA: Insufficient documentation

## 2017-11-03 DIAGNOSIS — D508 Other iron deficiency anemias: Secondary | ICD-10-CM

## 2017-11-03 DIAGNOSIS — K912 Postsurgical malabsorption, not elsewhere classified: Secondary | ICD-10-CM

## 2017-11-03 DIAGNOSIS — D500 Iron deficiency anemia secondary to blood loss (chronic): Secondary | ICD-10-CM

## 2017-11-03 DIAGNOSIS — Z9884 Bariatric surgery status: Secondary | ICD-10-CM | POA: Diagnosis not present

## 2017-11-03 DIAGNOSIS — E559 Vitamin D deficiency, unspecified: Secondary | ICD-10-CM

## 2017-11-03 DIAGNOSIS — R002 Palpitations: Secondary | ICD-10-CM | POA: Insufficient documentation

## 2017-11-03 DIAGNOSIS — D531 Other megaloblastic anemias, not elsewhere classified: Secondary | ICD-10-CM | POA: Diagnosis not present

## 2017-11-03 DIAGNOSIS — D561 Beta thalassemia: Secondary | ICD-10-CM

## 2017-11-03 DIAGNOSIS — Z79899 Other long term (current) drug therapy: Secondary | ICD-10-CM | POA: Diagnosis not present

## 2017-11-03 DIAGNOSIS — D509 Iron deficiency anemia, unspecified: Secondary | ICD-10-CM

## 2017-11-03 LAB — CBC WITH DIFFERENTIAL (CANCER CENTER ONLY)
Basophils Absolute: 0 10*3/uL (ref 0.0–0.1)
Basophils Relative: 0 %
Eosinophils Absolute: 0.2 10*3/uL (ref 0.0–0.5)
Eosinophils Relative: 2 %
HCT: 29.4 % — ABNORMAL LOW (ref 34.8–46.6)
Hemoglobin: 9.5 g/dL — ABNORMAL LOW (ref 11.6–15.9)
Lymphocytes Relative: 48 %
Lymphs Abs: 3 10*3/uL (ref 0.9–3.3)
MCH: 18.7 pg — ABNORMAL LOW (ref 26.0–34.0)
MCHC: 32.3 g/dL (ref 32.0–36.0)
MCV: 58 fL — ABNORMAL LOW (ref 81.0–101.0)
Monocytes Absolute: 0.4 10*3/uL (ref 0.1–0.9)
Monocytes Relative: 6 %
Neutro Abs: 2.8 10*3/uL (ref 1.5–6.5)
Neutrophils Relative %: 44 %
Platelet Count: 311 10*3/uL (ref 145–400)
RBC: 5.07 MIL/uL (ref 3.70–5.32)
RDW: 16.9 % — ABNORMAL HIGH (ref 11.1–15.7)
WBC Count: 6.3 10*3/uL (ref 3.9–10.0)

## 2017-11-03 LAB — IRON AND TIBC
Iron: 80 ug/dL (ref 41–142)
Saturation Ratios: 35 % (ref 21–57)
TIBC: 230 ug/dL — ABNORMAL LOW (ref 236–444)
UIBC: 150 ug/dL

## 2017-11-03 LAB — CMP (CANCER CENTER ONLY)
ALT: 8 U/L (ref 0–55)
AST: 12 U/L (ref 5–34)
Albumin: 3.6 g/dL (ref 3.5–5.0)
Alkaline Phosphatase: 63 U/L (ref 40–150)
Anion gap: 7 (ref 3–11)
BUN: 12 mg/dL (ref 7–26)
CO2: 25 mmol/L (ref 22–29)
Calcium: 9.5 mg/dL (ref 8.4–10.4)
Chloride: 107 mmol/L (ref 98–109)
Creatinine: 0.7 mg/dL (ref 0.60–1.10)
GFR, Est AFR Am: >60 mL/min (ref >60)
GFR, Estimated: >60 mL/min (ref >60)
Glucose, Bld: 92 mg/dL (ref 70–140)
Potassium: 4 mmol/L (ref 3.5–5.1)
Sodium: 139 mmol/L (ref 136–145)
Total Bilirubin: 0.8 mg/dL (ref 0.2–1.2)
Total Protein: 6.8 g/dL (ref 6.4–8.3)

## 2017-11-03 LAB — RETICULOCYTES
RBC.: 5.1 MIL/uL (ref 3.70–5.45)
Retic Count, Absolute: 102 10*3/uL — ABNORMAL HIGH (ref 33.7–90.7)
Retic Ct Pct: 2 % (ref 0.7–2.1)

## 2017-11-03 LAB — VITAMIN B12: Vitamin B-12: 2612 pg/mL — ABNORMAL HIGH (ref 180–914)

## 2017-11-03 LAB — FERRITIN: Ferritin: 1108 ng/mL — ABNORMAL HIGH (ref 9–269)

## 2017-11-03 LAB — LACTATE DEHYDROGENASE: LDH: 155 U/L (ref 125–245)

## 2017-11-03 NOTE — Progress Notes (Signed)
Hematology and Oncology Follow Up Visit  Abigail Hall [id number] [birth year] 37 y.o. 11/03/2017   Principle Diagnosis:  Thalassemia minor (beta thalassemia) History of iron deficiency anemia  Current Therapy:   Folic acid 2 mg by mouth daily - has not been taking IV iron as indicated - last received in May 2018   Interim History:  [name] is here today for follow-up. She is doing well but has had some fatigue and palpitations. Hgb is 9.5 with an MCV of 58. She is taking her 2 mg of folic acid daily but states that this has never seemed to help.  No episodes of bleeding, no bruising or petechiae.  She has had no fever, chills, ice cravings, n/v, cough, rash, dizziness, SOB, chest pain, palpitations, abdominal pain or changes in bowel or bladder habits.  She takes Mirilax as needed to help with constipation and regularity.  No swelling or tenderness in her extremities. She has occasional tingling in her fingertips and feet.  She has maintained a good appetite and is staying well hydrated. Her weight is stable.   ECOG Performance Status: 1 - Symptomatic but completely ambulatory  Medications:  Allergies as of 11/03/2017      Reactions   Latex Itching, Swelling, Rash   Where touched.   Cephalexin Hives      Medication List        Accurate as of 11/03/17  8:12 AM. Always use your most recent med list.          ALPRAZolam 1 MG tablet Commonly known as:  XANAX Take 1 mg by mouth 3 (three) times daily as needed for anxiety.   amoxicillin-clavulanate 875-125 MG tablet Commonly known as:  AUGMENTIN Take 1 tablet 2 (two) times daily by mouth.   benzonatate 100 MG capsule Commonly known as:  TESSALON Take 1 capsule (100 mg total) 3 (three) times daily as needed by mouth for cough.   cyclobenzaprine 10 MG tablet Commonly known as:  FLEXERIL Take 1 tablet (10 mg total) by mouth 2 (two) times daily as needed for muscle spasms.   divalproex 500 MG DR tablet Commonly  known as:  DEPAKOTE TAKE 2 TABLETS(1000 MG) BY MOUTH TWICE DAILY   Fluocinolone Acetonide Scalp 0.01 % Oil Apply 1 application topically daily as needed.   fluticasone 50 MCG/ACT nasal spray Commonly known as:  FLONASE Place 2 sprays daily into both nostrils.   folic acid 1 MG tablet Commonly known as:  FOLVITE Take 2 tablets (2 mg total) by mouth daily.   ketoconazole 2 % shampoo Commonly known as:  NIZORAL Apply 1 application topically 2 (two) times a week.   mometasone 0.1 % ointment Commonly known as:  ELOCON Apply topically daily.   phentermine 37.5 MG capsule Take 1 capsule (37.5 mg total) by mouth every morning.   traMADol 50 MG tablet Commonly known as:  ULTRAM TAKE 1 TO 2 TABLETS BY MOUTH EVERY 6 HOURS AS NEEDED   vitamin C 500 MG tablet Commonly known as:  ASCORBIC ACID Take 500 mg by mouth daily.       Allergies:  Allergies  Allergen Reactions  . Latex Itching, Swelling and Rash    Where touched.  . Cephalexin Hives    Past Medical History, Surgical history, Social history, and Family History were reviewed and updated.  Review of Systems: All other 10 point review of systems is negative.   Physical Exam:  vitals were not taken for this visit.   Wt Readings from  Last 3 Encounters:  08/30/17 205 lb (93 kg)  08/04/17 207 lb (93.9 kg)  06/29/17 218 lb 6.4 oz (99.1 kg)    Ocular: Sclerae unicteric, pupils equal, round and reactive to light Ear-nose-throat: Oropharynx clear, dentition fair Lymphatic: No cervical, supraclavicular or axillary adenopathy Lungs no rales or rhonchi, good excursion bilaterally Heart regular rate and rhythm, no murmur appreciated Abd soft, nontender, positive bowel sounds, no liver or spleen tip palpated on exam, no fluid wave  MSK no focal spinal tenderness, no joint edema Neuro: non-focal, well-oriented, appropriate affect Breasts: Deferred   Lab Results  Component Value Date   WBC 6.3 11/03/2017   HGB 9.4 (L)  08/04/2017   HCT 29.4 (L) 11/03/2017   MCV 58.0 (L) 11/03/2017   PLT 311 11/03/2017   Lab Results  Component Value Date   FERRITIN 1,285 (H) 08/04/2017   IRON 60 08/04/2017   TIBC 236 08/04/2017   UIBC 176 08/04/2017   IRONPCTSAT 25 08/04/2017   Lab Results  Component Value Date   RETICCTPCT 4.0 (H) 05/24/2014   RBC 5.07 11/03/2017   RETICCTABS 202.8 (H) 05/24/2014   No results found for: KPAFRELGTCHN, LAMBDASER, KAPLAMBRATIO No results found for: IGGSERUM, IGA, IGMSERUM No results found for: Odetta Pink, SPEI   Chemistry      Component Value Date/Time   NA 145 08/04/2017 0829   K 4.0 08/04/2017 0829   CL 108 08/04/2017 0829   CO2 26 08/04/2017 0829   BUN 11 08/04/2017 0829   CREATININE 0.8 08/04/2017 0829      Component Value Date/Time   CALCIUM 9.4 08/04/2017 0829   ALKPHOS 63 08/04/2017 0829   AST 16 08/04/2017 0829   ALT 14 08/04/2017 0829   BILITOT 0.70 08/04/2017 0829      Impression and Plan: 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  Hgb is 9.5 with an MCV of 58. She is having some fatigue and palpitations.  We will see what her iron studies show and bring her back in for infusion if needed.  I have also added a B-12 and vit D level to her lab work today.  We will go ahead and plan to see her back in another 3 months for follow-up.  She will contact our office with any questions or concerns. We can certainly see her sooner if need be.   Laverna Peace, NP 3/21/20198:12 AM

## 2017-11-04 LAB — ERYTHROPOIETIN: Erythropoietin: 14.6 m[IU]/mL (ref 2.6–18.5)

## 2017-11-04 LAB — VITAMIN D 25 HYDROXY (VIT D DEFICIENCY, FRACTURES): Vit D, 25-Hydroxy: 50 ng/mL (ref 30.0–100.0)

## 2017-12-29 ENCOUNTER — Other Ambulatory Visit: Payer: Self-pay | Admitting: Family

## 2017-12-29 DIAGNOSIS — K5900 Constipation, unspecified: Secondary | ICD-10-CM

## 2017-12-29 DIAGNOSIS — K649 Unspecified hemorrhoids: Secondary | ICD-10-CM

## 2018-02-03 ENCOUNTER — Inpatient Hospital Stay: Payer: 59 | Attending: Hematology & Oncology | Admitting: Hematology & Oncology

## 2018-02-03 ENCOUNTER — Encounter: Payer: Self-pay | Admitting: Hematology & Oncology

## 2018-02-03 ENCOUNTER — Other Ambulatory Visit: Payer: Self-pay

## 2018-02-03 ENCOUNTER — Inpatient Hospital Stay: Payer: 59

## 2018-02-03 ENCOUNTER — Other Ambulatory Visit: Payer: Self-pay | Admitting: *Deleted

## 2018-02-03 VITALS — BP 100/71 | HR 70 | Temp 98.2°F | Resp 18 | Wt 196.0 lb

## 2018-02-03 DIAGNOSIS — Z79899 Other long term (current) drug therapy: Secondary | ICD-10-CM | POA: Insufficient documentation

## 2018-02-03 DIAGNOSIS — D561 Beta thalassemia: Secondary | ICD-10-CM

## 2018-02-03 DIAGNOSIS — D508 Other iron deficiency anemias: Secondary | ICD-10-CM

## 2018-02-03 DIAGNOSIS — E559 Vitamin D deficiency, unspecified: Secondary | ICD-10-CM

## 2018-02-03 DIAGNOSIS — M25562 Pain in left knee: Secondary | ICD-10-CM | POA: Diagnosis not present

## 2018-02-03 DIAGNOSIS — D510 Vitamin B12 deficiency anemia due to intrinsic factor deficiency: Secondary | ICD-10-CM

## 2018-02-03 DIAGNOSIS — D509 Iron deficiency anemia, unspecified: Secondary | ICD-10-CM | POA: Insufficient documentation

## 2018-02-03 DIAGNOSIS — D500 Iron deficiency anemia secondary to blood loss (chronic): Secondary | ICD-10-CM

## 2018-02-03 DIAGNOSIS — K5900 Constipation, unspecified: Secondary | ICD-10-CM

## 2018-02-03 DIAGNOSIS — K649 Unspecified hemorrhoids: Secondary | ICD-10-CM

## 2018-02-03 LAB — CBC WITH DIFFERENTIAL (CANCER CENTER ONLY)
Basophils Absolute: 0 10*3/uL (ref 0.0–0.1)
Basophils Relative: 0 %
Eosinophils Absolute: 0.1 10*3/uL (ref 0.0–0.5)
Eosinophils Relative: 3 %
HCT: 31.5 % — ABNORMAL LOW (ref 34.8–46.6)
Hemoglobin: 10.1 g/dL — ABNORMAL LOW (ref 11.6–15.9)
Lymphocytes Relative: 47 %
Lymphs Abs: 2.7 10*3/uL (ref 0.9–3.3)
MCH: 18.6 pg — ABNORMAL LOW (ref 26.0–34.0)
MCHC: 32.1 g/dL (ref 32.0–36.0)
MCV: 57.9 fL — ABNORMAL LOW (ref 81.0–101.0)
Monocytes Absolute: 0.4 10*3/uL (ref 0.1–0.9)
Monocytes Relative: 7 %
Neutro Abs: 2.4 10*3/uL (ref 1.5–6.5)
Neutrophils Relative %: 43 %
Platelet Count: 286 10*3/uL (ref 145–400)
RBC: 5.44 MIL/uL — ABNORMAL HIGH (ref 3.70–5.32)
RDW: 16.3 % — ABNORMAL HIGH (ref 11.1–15.7)
WBC Count: 5.7 10*3/uL (ref 3.9–10.0)

## 2018-02-03 LAB — CMP (CANCER CENTER ONLY)
ALT: 13 U/L (ref 10–47)
AST: 16 U/L (ref 11–38)
Albumin: 3.4 g/dL — ABNORMAL LOW (ref 3.5–5.0)
Alkaline Phosphatase: 53 U/L (ref 26–84)
Anion gap: 8 (ref 5–15)
BUN: 12 mg/dL (ref 7–22)
CO2: 25 mmol/L (ref 18–33)
Calcium: 8.8 mg/dL (ref 8.0–10.3)
Chloride: 110 mmol/L — ABNORMAL HIGH (ref 98–108)
Creatinine: 0.7 mg/dL (ref 0.60–1.20)
Glucose, Bld: 106 mg/dL (ref 73–118)
Potassium: 4.1 mmol/L (ref 3.3–4.7)
Sodium: 143 mmol/L (ref 128–145)
Total Bilirubin: 0.8 mg/dL (ref 0.2–1.6)
Total Protein: 6.9 g/dL (ref 6.4–8.1)

## 2018-02-03 LAB — RETICULOCYTES
RBC.: 5.43 MIL/uL (ref 3.70–5.45)
Retic Count, Absolute: 108.6 10*3/uL — ABNORMAL HIGH (ref 33.7–90.7)
Retic Ct Pct: 2 % (ref 0.7–2.1)

## 2018-02-03 LAB — IRON AND TIBC
Iron: 83 ug/dL (ref 41–142)
Saturation Ratios: 30 % (ref 21–57)
TIBC: 273 ug/dL (ref 236–444)
UIBC: 190 ug/dL

## 2018-02-03 LAB — VITAMIN B12: Vitamin B-12: 2955 pg/mL — ABNORMAL HIGH (ref 180–914)

## 2018-02-03 LAB — FERRITIN: Ferritin: 1101 ng/mL — ABNORMAL HIGH (ref 9–269)

## 2018-02-03 MED ORDER — PRE-NATAL FORMULA PO TABS: 1.0000 | ORAL_TABLET | Freq: Every day | ORAL | 12 refills | Status: DC

## 2018-02-03 MED ORDER — POLYETHYLENE GLYCOL 3350 17 GM/SCOOP PO POWD: 17.0000 g | Freq: Every day | ORAL | 6 refills | Status: DC

## 2018-02-03 NOTE — Progress Notes (Signed)
Hematology and Oncology Follow Up Visit  Abigail Hall [id number] Apr 11, 1981 37 y.o. 02/03/2018   Principle Diagnosis:  Thalassemia minor (beta thalassemia) History of iron deficiency anemia  Current Therapy:   Folic acid 2 mg by mouth daily - has not been taking IV iron as indicated - last received in May 2018   Interim History:  Abigail Hall is here today for follow-up.  She is doing fairly well.  She feels a little bit better.  She has a little bit more energy.  She actually is taking prenatal vitamins which she says is helping.  Her biggest complaint is been pain in the left knee.  She needs to see an orthopedist for this.  We will see about making a referral to Dr. Rhona Raider.  There is been no problems with fever.  She is had no rashes.  She has had no cough or shortness of breath.  She is had no change in bowel or bladder habits.  Her iron studies back in March showed a ferritin of 1100 with an iron saturation of 35%.  She has a vitamin B12 level of 2600.  She is had no headaches.  Overall, her performance status is ECOG 1.  Medications:  Allergies as of 02/03/2018      Reactions   Cephalexin Hives   Latex Itching, Swelling, Rash   Where touched.      Medication List        Accurate as of 02/03/18  8:58 AM. Always use your most recent med list.          ALPRAZolam 1 MG tablet Commonly known as:  XANAX Take 1 mg by mouth 3 (three) times daily as needed for anxiety.   divalproex 500 MG DR tablet Commonly known as:  DEPAKOTE TAKE 2 TABLETS(1000 MG) BY MOUTH TWICE DAILY   Fluocinolone Acetonide Scalp 0.01 % Oil Apply 1 application topically daily as needed.   fluticasone 50 MCG/ACT nasal spray Commonly known as:  FLONASE Place 2 sprays daily into both nostrils.   folic acid 1 MG tablet Commonly known as:  FOLVITE Take 2 tablets (2 mg total) by mouth daily.   ketoconazole 2 % shampoo Commonly known as:  NIZORAL Apply 1 application topically 2  (two) times a week.   polyethylene glycol packet Commonly known as:  MIRALAX / GLYCOLAX MIX 17 GRAMS WITH LIQUID AND TAKE BY MOUTH DAILY AS NEEDED FOR SEVERE CONSTIPATION   traMADol 50 MG tablet Commonly known as:  ULTRAM TAKE 1 TO 2 TABLETS BY MOUTH EVERY 6 HOURS AS NEEDED   vitamin C 500 MG tablet Commonly known as:  ASCORBIC ACID Take 500 mg by mouth daily.       Allergies:  Allergies  Allergen Reactions  . Cephalexin Hives  . Latex Itching, Swelling and Rash    Where touched.    Past Medical History, Surgical history, Social history, and Family History were reviewed and updated.  Review of Systems: Review of Systems  Constitutional: Negative.   HENT: Negative.   Eyes: Negative.   Respiratory: Negative.   Cardiovascular: Negative.   Gastrointestinal: Negative.   Genitourinary: Negative.   Musculoskeletal: Positive for joint pain.  Skin: Negative.   Neurological: Negative.   Endo/Heme/Allergies: Negative.   Psychiatric/Behavioral: Negative.       Physical Exam:  weight is 196 lb (88.9 kg). Her oral temperature is 98.2 F (36.8 C). Her blood pressure is 100/71 and her pulse is 70. Her respiration is 18 and oxygen saturation is  100%.   Wt Readings from Last 3 Encounters:  02/03/18 196 lb (88.9 kg)  11/03/17 195 lb (88.5 kg)  08/30/17 205 lb (93 kg)    Physical Exam  Constitutional: She is oriented to person, place, and time.  HENT:  Head: Normocephalic and atraumatic.  Mouth/Throat: Oropharynx is clear and moist.  Eyes: Pupils are equal, round, and reactive to light. EOM are normal.  Neck: Normal range of motion.  Cardiovascular: Normal rate, regular rhythm and normal heart sounds.  Pulmonary/Chest: Effort normal and breath sounds normal.  Abdominal: Soft. Bowel sounds are normal.  Musculoskeletal: Normal range of motion. She exhibits no edema, tenderness or deformity.  Lymphadenopathy:    She has no cervical adenopathy.  Neurological: She is alert  and oriented to person, place, and time.  Skin: Skin is warm and dry. No rash noted. No erythema.  Psychiatric: She has a normal mood and affect. Her behavior is normal. Judgment and thought content normal.  Vitals reviewed.    Lab Results  Component Value Date   WBC 5.7 02/03/2018   HGB 10.1 (L) 02/03/2018   HCT 31.5 (L) 02/03/2018   MCV 57.9 (L) 02/03/2018   PLT 286 02/03/2018   Lab Results  Component Value Date   FERRITIN 1,108 (H) 11/03/2017   IRON 80 11/03/2017   TIBC 230 (L) 11/03/2017   UIBC 150 11/03/2017   IRONPCTSAT 35 11/03/2017   Lab Results  Component Value Date   RETICCTPCT 2.0 11/03/2017   RBC 5.44 (H) 02/03/2018   RETICCTABS 202.8 (H) 05/24/2014   No results found for: KPAFRELGTCHN, LAMBDASER, KAPLAMBRATIO No results found for: IGGSERUM, IGA, IGMSERUM No results found for: Odetta Pink, SPEI   Chemistry      Component Value Date/Time   NA 143 02/03/2018 0823   NA 145 08/04/2017 0829   K 4.1 02/03/2018 0823   K 4.0 08/04/2017 0829   CL 110 (H) 02/03/2018 0823   CL 108 08/04/2017 0829   CO2 25 02/03/2018 0823   CO2 26 08/04/2017 0829   BUN 12 02/03/2018 0823   BUN 11 08/04/2017 0829   CREATININE 0.70 02/03/2018 0823   CREATININE 0.8 08/04/2017 0829      Component Value Date/Time   CALCIUM 8.8 02/03/2018 0823   CALCIUM 9.4 08/04/2017 0829   ALKPHOS 53 02/03/2018 0823   ALKPHOS 63 08/04/2017 0829   AST 16 02/03/2018 0823   ALT 13 02/03/2018 0823   ALT 14 08/04/2017 0829   BILITOT 0.8 02/03/2018 0823      Impression and Plan: Abigail Hall is a very pleasant 37 yo [name] with beta thalassemia and 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  She is doing better.  She has a rise in her hemoglobin which is nice to see.  We will have to make a referral to orthopedic surgery for her.  It sounds like she might need to have a specialist look  at her right knee.  We will plan to see her back in 3 more months.  We will try to get her through the summertime now.   [name], MD 6/21/20198:58 AM

## 2018-02-04 LAB — VITAMIN D 25 HYDROXY (VIT D DEFICIENCY, FRACTURES): Vit D, 25-Hydroxy: 45.5 ng/mL (ref 30.0–100.0)

## 2018-02-06 ENCOUNTER — Encounter: Payer: Self-pay | Admitting: *Deleted

## 2018-05-05 ENCOUNTER — Inpatient Hospital Stay: Payer: 59 | Attending: Hematology & Oncology | Admitting: Hematology & Oncology

## 2018-05-05 ENCOUNTER — Other Ambulatory Visit: Payer: Self-pay

## 2018-05-05 ENCOUNTER — Inpatient Hospital Stay: Payer: 59

## 2018-05-05 ENCOUNTER — Encounter: Payer: Self-pay | Admitting: Hematology & Oncology

## 2018-05-05 ENCOUNTER — Telehealth: Payer: Self-pay | Admitting: *Deleted

## 2018-05-05 VITALS — BP 125/65 | HR 66 | Temp 98.3°F | Resp 16 | Wt 198.0 lb

## 2018-05-05 DIAGNOSIS — D561 Beta thalassemia: Secondary | ICD-10-CM | POA: Diagnosis not present

## 2018-05-05 DIAGNOSIS — D500 Iron deficiency anemia secondary to blood loss (chronic): Secondary | ICD-10-CM

## 2018-05-05 DIAGNOSIS — M2550 Pain in unspecified joint: Secondary | ICD-10-CM

## 2018-05-05 DIAGNOSIS — D509 Iron deficiency anemia, unspecified: Secondary | ICD-10-CM

## 2018-05-05 DIAGNOSIS — D563 Thalassemia minor: Secondary | ICD-10-CM

## 2018-05-05 DIAGNOSIS — Z79899 Other long term (current) drug therapy: Secondary | ICD-10-CM | POA: Insufficient documentation

## 2018-05-05 DIAGNOSIS — Z9884 Bariatric surgery status: Secondary | ICD-10-CM

## 2018-05-05 LAB — CMP (CANCER CENTER ONLY)
ALT: 9 U/L (ref 0–44)
AST: 12 U/L — ABNORMAL LOW (ref 15–41)
Albumin: 3.7 g/dL (ref 3.5–5.0)
Alkaline Phosphatase: 62 U/L (ref 38–126)
Anion gap: 7 (ref 5–15)
BUN: 9 mg/dL (ref 6–20)
CO2: 24 mmol/L (ref 22–32)
Calcium: 9.5 mg/dL (ref 8.9–10.3)
Chloride: 110 mmol/L (ref 98–111)
Creatinine: 0.71 mg/dL (ref 0.44–1.00)
GFR, Est AFR Am: >60 mL/min (ref >60)
GFR, Estimated: >60 mL/min (ref >60)
Glucose, Bld: 101 mg/dL — ABNORMAL HIGH (ref 70–99)
Potassium: 4.4 mmol/L (ref 3.5–5.1)
Sodium: 141 mmol/L (ref 135–145)
Total Bilirubin: 0.6 mg/dL (ref 0.3–1.2)
Total Protein: 6.8 g/dL (ref 6.5–8.1)

## 2018-05-05 LAB — CBC WITH DIFFERENTIAL (CANCER CENTER ONLY)
Basophils Absolute: 0 10*3/uL (ref 0.0–0.1)
Basophils Relative: 0 %
Eosinophils Absolute: 0.1 10*3/uL (ref 0.0–0.5)
Eosinophils Relative: 3 %
HCT: 29.2 % — ABNORMAL LOW (ref 34.8–46.6)
Hemoglobin: 9.3 g/dL — ABNORMAL LOW (ref 11.6–15.9)
Lymphocytes Relative: 45 %
Lymphs Abs: 2.4 10*3/uL (ref 0.9–3.3)
MCH: 18.7 pg — ABNORMAL LOW (ref 26.0–34.0)
MCHC: 31.8 g/dL — ABNORMAL LOW (ref 32.0–36.0)
MCV: 58.6 fL — ABNORMAL LOW (ref 81.0–101.0)
Monocytes Absolute: 0.3 10*3/uL (ref 0.1–0.9)
Monocytes Relative: 7 %
Neutro Abs: 2.3 10*3/uL (ref 1.5–6.5)
Neutrophils Relative %: 45 %
Platelet Count: 273 10*3/uL (ref 145–400)
RBC: 4.98 MIL/uL (ref 3.70–5.32)
RDW: 17.1 % — ABNORMAL HIGH (ref 11.1–15.7)
WBC Count: 5.1 10*3/uL (ref 3.9–10.0)

## 2018-05-05 LAB — RETICULOCYTES
RBC.: 5.02 MIL/uL (ref 3.70–5.45)
Retic Count, Absolute: 150.6 10*3/uL — ABNORMAL HIGH (ref 33.7–90.7)
Retic Ct Pct: 3 % — ABNORMAL HIGH (ref 0.7–2.1)

## 2018-05-05 LAB — IRON AND TIBC
Iron: 69 ug/dL (ref 41–142)
Saturation Ratios: 27 % (ref 21–57)
TIBC: 254 ug/dL (ref 236–444)
UIBC: 184 ug/dL

## 2018-05-05 LAB — FERRITIN: Ferritin: 845 ng/mL — ABNORMAL HIGH (ref 11–307)

## 2018-05-05 LAB — SAMPLE TO BLOOD BANK

## 2018-05-05 MED ORDER — NITROFURANTOIN MONOHYD MACRO 100 MG PO CAPS: 100.0000 mg | ORAL_CAPSULE | Freq: Two times a day (BID) | ORAL | 3 refills | Status: DC

## 2018-05-05 MED FILL — NITROFURANTOIN MONO-MCR 100: 100 | 15 days supply | Qty: 30 | Fill #0

## 2018-05-05 NOTE — Telephone Encounter (Signed)
-----   Message from [name], MD sent at 05/05/2018 12:51 PM EDT ----- Call - iron is ok!!  Laurey Arrow

## 2018-05-05 NOTE — Progress Notes (Signed)
Hematology and Oncology Follow Up Visit  Abigail Hall [id number] 12-22-1980 37 y.o. 05/05/2018   Principle Diagnosis:  Thalassemia minor (beta thalassemia) History of iron deficiency anemia  Current Therapy:   Folic acid 2 mg by mouth daily IV iron as indicated - last received in May 2018   Interim History:  Abigail Hall is here today for follow-up.  She is doing fairly well.  Last saw her back in June.  She is had a nice summer.  She is been quite busy.  She is working.  Her son apparently had some kind of E. coli colitis.  He is getting better.  I told her that he probably could be taking some probiotic.  I do not see a problem with him taking a probiotic.  When we last saw her, the iron studies showed a ferritin of 1100 with an iron saturation of 30%.  She is had no bleeding.  There is been no cough or shortness of breath.  She has had no leg swelling.  Her left knee is doing better.  She was having some problems with the left knee when we last saw her.  She did not see an orthopedist.  She prayed about it.  Her left knee has gotten better.    Overall, her performance status is ECOG 1.  Medications:  Allergies as of 05/05/2018      Reactions   Cephalexin Hives   Latex Itching, Swelling, Rash   Where touched.      Medication List        Accurate as of 05/05/18  8:49 AM. Always use your most recent med list.          ALPRAZolam 1 MG tablet Commonly known as:  XANAX Take 1 mg by mouth 3 (three) times daily as needed for anxiety.   divalproex 500 MG DR tablet Commonly known as:  DEPAKOTE TAKE 2 TABLETS(1000 MG) BY MOUTH TWICE DAILY   Fluocinolone Acetonide Scalp 0.01 % Oil Apply 1 application topically daily as needed.   fluticasone 50 MCG/ACT nasal spray Commonly known as:  FLONASE Place 2 sprays daily into both nostrils.   folic acid 1 MG tablet Commonly known as:  FOLVITE Take 2 tablets (2 mg total) by mouth daily.   ketoconazole 2 %  shampoo Commonly known as:  NIZORAL Apply 1 application topically 2 (two) times a week.   polyethylene glycol powder powder Commonly known as:  GLYCOLAX/MIRALAX Take 17 g by mouth daily.   PRE-NATAL FORMULA Tabs Take 1 tablet by mouth daily with breakfast.   traMADol 50 MG tablet Commonly known as:  ULTRAM TAKE 1 TO 2 TABLETS BY MOUTH EVERY 6 HOURS AS NEEDED   vitamin C 500 MG tablet Commonly known as:  ASCORBIC ACID Take 500 mg by mouth daily.       Allergies:  Allergies  Allergen Reactions  . Cephalexin Hives  . Latex Itching, Swelling and Rash    Where touched.    Past Medical History, Surgical history, Social history, and Family History were reviewed and updated.  Review of Systems: Review of Systems  Constitutional: Negative.   HENT: Negative.   Eyes: Negative.   Respiratory: Negative.   Cardiovascular: Negative.   Gastrointestinal: Negative.   Genitourinary: Negative.   Musculoskeletal: Positive for joint pain.  Skin: Negative.   Neurological: Negative.   Endo/Heme/Allergies: Negative.   Psychiatric/Behavioral: Negative.       Physical Exam:  weight is 198 lb (89.8 kg). Her oral temperature is  98.3 F (36.8 C). Her blood pressure is 125/65 and her pulse is 66. Her respiration is 16 and oxygen saturation is 100%.   Wt Readings from Last 3 Encounters:  05/05/18 198 lb (89.8 kg)  02/03/18 196 lb (88.9 kg)  11/03/17 195 lb (88.5 kg)    Physical Exam  Constitutional: She is oriented to person, place, and time.  HENT:  Head: Normocephalic and atraumatic.  Mouth/Throat: Oropharynx is clear and moist.  Eyes: Pupils are equal, round, and reactive to light. EOM are normal.  Neck: Normal range of motion.  Cardiovascular: Normal rate, regular rhythm and normal heart sounds.  Pulmonary/Chest: Effort normal and breath sounds normal.  Abdominal: Soft. Bowel sounds are normal.  Musculoskeletal: Normal range of motion. She exhibits no edema, tenderness or  deformity.  Lymphadenopathy:    She has no cervical adenopathy.  Neurological: She is alert and oriented to person, place, and time.  Skin: Skin is warm and dry. No rash noted. No erythema.  Psychiatric: She has a normal mood and affect. Her behavior is normal. Judgment and thought content normal.  Vitals reviewed.    Lab Results  Component Value Date   WBC 5.1 05/05/2018   HGB 9.3 (L) 05/05/2018   HCT 29.2 (L) 05/05/2018   MCV 58.6 (L) 05/05/2018   PLT 273 05/05/2018   Lab Results  Component Value Date   FERRITIN 1,101 (H) 02/03/2018   IRON 83 02/03/2018   TIBC 273 02/03/2018   UIBC 190 02/03/2018   IRONPCTSAT 30 02/03/2018   Lab Results  Component Value Date   RETICCTPCT 2.0 02/03/2018   RBC 4.98 05/05/2018   RETICCTABS 202.8 (H) 05/24/2014   No results found for: KPAFRELGTCHN, LAMBDASER, KAPLAMBRATIO No results found for: IGGSERUM, IGA, IGMSERUM No results found for: Odetta Pink, SPEI   Chemistry      Component Value Date/Time   NA 143 02/03/2018 0823   NA 145 08/04/2017 0829   K 4.1 02/03/2018 0823   K 4.0 08/04/2017 0829   CL 110 (H) 02/03/2018 0823   CL 108 08/04/2017 0829   CO2 25 02/03/2018 0823   CO2 26 08/04/2017 0829   BUN 12 02/03/2018 0823   BUN 11 08/04/2017 0829   CREATININE 0.70 02/03/2018 0823   CREATININE 0.8 08/04/2017 0829      Component Value Date/Time   CALCIUM 8.8 02/03/2018 0823   CALCIUM 9.4 08/04/2017 0829   ALKPHOS 53 02/03/2018 0823   ALKPHOS 63 08/04/2017 0829   AST 16 02/03/2018 0823   ALT 13 02/03/2018 0823   ALT 14 08/04/2017 0829   BILITOT 0.8 02/03/2018 0823      Impression and Plan: Abigail Hall is a very pleasant 37 yo [name] with beta thalassemia and 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  We will see what her iron studies show.  She may need a dose of IV iron.  I implored her to keep taking the folic  acid.  She must take folic acid.  I would like to see her back right before Thanksgiving.  I would like to see her back before the holiday season so we can make sure that her blood is doing well.Abigail Hall   [name], MD 9/20/20198:49 AM

## 2018-05-05 NOTE — Telephone Encounter (Signed)
Patient notified per order of Dr. Marin Olp that iron is ok.  Patient appreciative of call and has no questions at this time.

## 2018-05-08 LAB — HEMOGLOBINOPATHY EVALUATION
Hgb A2 Quant: 4.5 % — ABNORMAL HIGH (ref 1.8–3.2)
Hgb A: 93 % — ABNORMAL LOW (ref 96.4–98.8)
Hgb C: 0 %
Hgb F Quant: 2.5 % — ABNORMAL HIGH (ref 0.0–2.0)
Hgb S Quant: 0 %
Hgb Variant: 0 %

## 2018-05-24 ENCOUNTER — Other Ambulatory Visit: Payer: Self-pay | Admitting: Neurology

## 2018-06-23 ENCOUNTER — Inpatient Hospital Stay: Payer: 59

## 2018-06-23 ENCOUNTER — Encounter: Payer: Self-pay | Admitting: Hematology & Oncology

## 2018-06-23 ENCOUNTER — Inpatient Hospital Stay: Payer: 59 | Attending: Hematology & Oncology | Admitting: Hematology & Oncology

## 2018-06-23 ENCOUNTER — Other Ambulatory Visit: Payer: Self-pay

## 2018-06-23 VITALS — BP 117/67 | HR 76 | Temp 98.3°F | Resp 16 | Wt 198.1 lb

## 2018-06-23 DIAGNOSIS — D509 Iron deficiency anemia, unspecified: Secondary | ICD-10-CM

## 2018-06-23 DIAGNOSIS — D500 Iron deficiency anemia secondary to blood loss (chronic): Secondary | ICD-10-CM

## 2018-06-23 DIAGNOSIS — D563 Thalassemia minor: Secondary | ICD-10-CM

## 2018-06-23 DIAGNOSIS — Z79899 Other long term (current) drug therapy: Secondary | ICD-10-CM | POA: Diagnosis not present

## 2018-06-23 LAB — CMP (CANCER CENTER ONLY)
ALT: 10 U/L (ref 0–44)
AST: 11 U/L — ABNORMAL LOW (ref 15–41)
Albumin: 3.6 g/dL (ref 3.5–5.0)
Alkaline Phosphatase: 58 U/L (ref 38–126)
Anion gap: 8 (ref 5–15)
BUN: 15 mg/dL (ref 6–20)
CO2: 25 mmol/L (ref 22–32)
Calcium: 9.3 mg/dL (ref 8.9–10.3)
Chloride: 110 mmol/L (ref 98–111)
Creatinine: 0.72 mg/dL (ref 0.44–1.00)
GFR, Est AFR Am: >60 mL/min (ref >60)
GFR, Estimated: >60 mL/min (ref >60)
Glucose, Bld: 102 mg/dL — ABNORMAL HIGH (ref 70–99)
Potassium: 4.7 mmol/L (ref 3.5–5.1)
Sodium: 143 mmol/L (ref 135–145)
Total Bilirubin: 0.6 mg/dL (ref 0.3–1.2)
Total Protein: 6.6 g/dL (ref 6.5–8.1)

## 2018-06-23 LAB — CBC WITH DIFFERENTIAL (CANCER CENTER ONLY)
Abs Immature Granulocytes: 0.01 10*3/uL (ref 0.00–0.07)
Basophils Absolute: 0 10*3/uL (ref 0.0–0.1)
Basophils Relative: 0 %
Eosinophils Absolute: 0.1 10*3/uL (ref 0.0–0.5)
Eosinophils Relative: 2 %
HCT: 31.6 % — ABNORMAL LOW (ref 36.0–46.0)
Hemoglobin: 9.2 g/dL — ABNORMAL LOW (ref 12.0–15.0)
Immature Granulocytes: 0 %
Lymphocytes Relative: 52 %
Lymphs Abs: 2.9 10*3/uL (ref 0.7–4.0)
MCH: 17.6 pg — ABNORMAL LOW (ref 26.0–34.0)
MCHC: 29.1 g/dL — ABNORMAL LOW (ref 30.0–36.0)
MCV: 60.4 fL — ABNORMAL LOW (ref 80.0–100.0)
Monocytes Absolute: 0.3 10*3/uL (ref 0.1–1.0)
Monocytes Relative: 6 %
Neutro Abs: 2.2 10*3/uL (ref 1.7–7.7)
Neutrophils Relative %: 40 %
Platelet Count: 276 10*3/uL (ref 150–400)
RBC: 5.23 MIL/uL — ABNORMAL HIGH (ref 3.87–5.11)
RDW: 16.2 % — ABNORMAL HIGH (ref 11.5–15.5)
WBC Count: 5.6 10*3/uL (ref 4.0–10.5)
nRBC: 0 % (ref 0.0–0.2)

## 2018-06-23 LAB — RETICULOCYTES
Immature Retic Fract: 15.9 % (ref 2.3–15.9)
RBC.: 5.23 MIL/uL — ABNORMAL HIGH (ref 3.87–5.11)
Retic Count, Absolute: 132.3 10*3/uL (ref 19.0–186.0)
Retic Ct Pct: 2.5 % (ref 0.4–3.1)

## 2018-06-23 LAB — SAVE SMEAR (SSMR)

## 2018-06-23 LAB — IRON AND TIBC
Iron: 89 ug/dL (ref 41–142)
Saturation Ratios: 34 % (ref 21–57)
TIBC: 259 ug/dL (ref 236–444)
UIBC: 170 ug/dL (ref 120–384)

## 2018-06-23 LAB — FERRITIN: Ferritin: 919 ng/mL — ABNORMAL HIGH (ref 11–307)

## 2018-06-23 NOTE — Progress Notes (Signed)
Hematology and Oncology Follow Up Visit  Abigail Hall [id number] [birth year] 37 y.o. 06/23/2018   Principle Diagnosis:  Thalassemia minor (beta thalassemia) History of iron deficiency anemia  Current Therapy:   Folic acid 2 mg by mouth daily IV iron as indicated - last received in May 2018   Interim History:  Abigail Hall is here today for follow-up.  She is doing fairly well.  She actually has go down to [place].  She does go down to the [employer] for a job interview.  I am sure that she will get the job.  Hopefully, she will not have to move down to [place].  If so, then we will find a hematologist for her down there.  She is doing okay otherwise.  The cold weather is not bothering her too much.  Her sister was just hospitalized.  She had a sickle cell crisis.  She is actually doing pretty well right now.  She is had no fever.  She is had no change in bowel or bladder habits.  She has had no cough.  We last saw her back in September, her ferritin was a 45 with an iron saturation of 27%.    Of note, her erythropoietin level back in March was only 14.6.  As such, we might want to consider trying Procrit to see if this can help with getting her blood count up a little bit.  Thankfully, she really is not symptomatic with her anemia.     Overall, her performance status is ECOG 1.  Medications:  Allergies as of 06/23/2018      Reactions   Cephalexin Hives   Latex Itching, Swelling, Rash   Where touched.      Medication List        Accurate as of 06/23/18  9:23 AM. Always use your most recent med list.          ALPRAZolam 1 MG tablet Commonly known as:  XANAX Take 1 mg by mouth 3 (three) times daily as needed for anxiety.   b complex vitamins tablet Take by mouth.   Biotin 10000 MCG Tbdp Take 10,000 mcg by mouth daily.   divalproex 500 MG DR tablet Commonly known as:  DEPAKOTE TAKE 2 TABLETS(1000 MG) BY MOUTH TWICE DAILY   Fluocinolone  Acetonide Scalp 0.01 % Oil Apply 1 application topically daily as needed.   fluticasone 50 MCG/ACT nasal spray Commonly known as:  FLONASE Place 2 sprays daily into both nostrils.   folic acid 1 MG tablet Commonly known as:  FOLVITE Take 2 tablets (2 mg total) by mouth daily.   ketoconazole 2 % shampoo Commonly known as:  NIZORAL Apply 1 application topically 2 (two) times a week.   nitrofurantoin (macrocrystal-monohydrate) 100 MG capsule Commonly known as:  MACROBID Take 1 capsule (100 mg total) by mouth 2 (two) times daily.   polyethylene glycol powder powder Commonly known as:  GLYCOLAX/MIRALAX Take 17 g by mouth daily.   PRE-NATAL FORMULA Tabs Take 1 tablet by mouth daily with breakfast.   traMADol 50 MG tablet Commonly known as:  ULTRAM TAKE 1 TO 2 TABLETS BY MOUTH EVERY 6 HOURS AS NEEDED   vitamin C 500 MG tablet Commonly known as:  ASCORBIC ACID Take 500 mg by mouth daily.       Allergies:  Allergies  Allergen Reactions  . Cephalexin Hives  . Latex Itching, Swelling and Rash    Where touched.    Past Medical History, Surgical history,  Social history, and Family History were reviewed and updated.  Review of Systems: Review of Systems  Constitutional: Negative.   HENT: Negative.   Eyes: Negative.   Respiratory: Negative.   Cardiovascular: Negative.   Gastrointestinal: Negative.   Genitourinary: Negative.   Musculoskeletal: Positive for joint pain.  Skin: Negative.   Neurological: Negative.   Endo/Heme/Allergies: Negative.   Psychiatric/Behavioral: Negative.       Physical Exam:  weight is 198 lb 1.9 oz (89.9 kg). Her oral temperature is 98.3 F (36.8 C). Her blood pressure is 117/67 and her pulse is 76. Her respiration is 16 and oxygen saturation is 100%.   Wt Readings from Last 3 Encounters:  06/23/18 198 lb 1.9 oz (89.9 kg)  05/05/18 198 lb (89.8 kg)  02/03/18 196 lb (88.9 kg)    Physical Exam  Constitutional: She is oriented to  person, place, and time.  HENT:  Head: Normocephalic and atraumatic.  Mouth/Throat: Oropharynx is clear and moist.  Eyes: Pupils are equal, round, and reactive to light. EOM are normal.  Neck: Normal range of motion.  Cardiovascular: Normal rate, regular rhythm and normal heart sounds.  Pulmonary/Chest: Effort normal and breath sounds normal.  Abdominal: Soft. Bowel sounds are normal.  Musculoskeletal: Normal range of motion. She exhibits no edema, tenderness or deformity.  Lymphadenopathy:    She has no cervical adenopathy.  Neurological: She is alert and oriented to person, place, and time.  Skin: Skin is warm and dry. No rash noted. No erythema.  Psychiatric: She has a normal mood and affect. Her behavior is normal. Judgment and thought content normal.  Vitals reviewed.    Lab Results  Component Value Date   WBC 5.6 06/23/2018   HGB 9.2 (L) 06/23/2018   HCT 31.6 (L) 06/23/2018   MCV 60.4 (L) 06/23/2018   PLT 276 06/23/2018   Lab Results  Component Value Date   FERRITIN 845 (H) 05/05/2018   IRON 69 05/05/2018   TIBC 254 05/05/2018   UIBC 184 05/05/2018   IRONPCTSAT 27 05/05/2018   Lab Results  Component Value Date   RETICCTPCT 2.5 06/23/2018   RBC 5.23 (H) 06/23/2018   RBC 5.23 (H) 06/23/2018   RETICCTABS 202.8 (H) 05/24/2014   No results found for: KPAFRELGTCHN, LAMBDASER, KAPLAMBRATIO No results found for: IGGSERUM, IGA, IGMSERUM No results found for: Odetta Pink, SPEI   Chemistry      Component Value Date/Time   NA 141 05/05/2018 0825   NA 145 08/04/2017 0829   K 4.4 05/05/2018 0825   K 4.0 08/04/2017 0829   CL 110 05/05/2018 0825   CL 108 08/04/2017 0829   CO2 24 05/05/2018 0825   CO2 26 08/04/2017 0829   BUN 9 05/05/2018 0825   BUN 11 08/04/2017 0829   CREATININE 0.71 05/05/2018 0825   CREATININE 0.8 08/04/2017 0829      Component Value Date/Time   CALCIUM 9.5 05/05/2018 0825   CALCIUM 9.4  08/04/2017 0829   ALKPHOS 62 05/05/2018 0825   ALKPHOS 63 08/04/2017 0829   AST 12 (L) 05/05/2018 0825   ALT 9 05/05/2018 0825   ALT 14 08/04/2017 0829   BILITOT 0.6 05/05/2018 0825      Impression and Plan: Abigail Hall is a very pleasant 37 yo [name] with beta thalassemia and 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  We will see what her iron studies show.  She may need a dose of  IV iron.  I implored her to keep taking the folic acid.  She must take folic acid.  I would like to see her back in 6 weeks.  This will be right around Christmas.  Again, given the low erythropoietin level, we might consider ESA administration.    [name], MD 11/8/20199:23 AM

## 2018-06-26 ENCOUNTER — Telehealth: Payer: Self-pay | Admitting: *Deleted

## 2018-06-26 LAB — HEMOGLOBINOPATHY EVALUATION
Hgb A2 Quant: 4.5 % — ABNORMAL HIGH (ref 1.8–3.2)
Hgb A: 93.1 % — ABNORMAL LOW (ref 96.4–98.8)
Hgb C: 0 %
Hgb F Quant: 2.4 % — ABNORMAL HIGH (ref 0.0–2.0)
Hgb S Quant: 0 %
Hgb Variant: 0 %

## 2018-06-26 NOTE — Telephone Encounter (Signed)
-----   Message from [name], MD sent at 06/23/2018  5:33 PM EST ----- Call - iron level is ok!!!  Laurey Arrow

## 2018-06-26 NOTE — Telephone Encounter (Signed)
Notified pt Iron level [place] per MD, f/u as scheduled 12/20. No further concerns.

## 2018-08-04 ENCOUNTER — Inpatient Hospital Stay: Payer: 59

## 2018-08-04 ENCOUNTER — Inpatient Hospital Stay: Payer: 59 | Attending: Hematology & Oncology | Admitting: Hematology & Oncology

## 2018-08-04 ENCOUNTER — Encounter: Payer: Self-pay | Admitting: Hematology & Oncology

## 2018-08-04 ENCOUNTER — Other Ambulatory Visit: Payer: Self-pay

## 2018-08-04 VITALS — BP 109/70 | HR 71 | Temp 98.4°F | Resp 16 | Wt 200.0 lb

## 2018-08-04 DIAGNOSIS — D563 Thalassemia minor: Secondary | ICD-10-CM | POA: Insufficient documentation

## 2018-08-04 DIAGNOSIS — D500 Iron deficiency anemia secondary to blood loss (chronic): Secondary | ICD-10-CM

## 2018-08-04 DIAGNOSIS — D509 Iron deficiency anemia, unspecified: Secondary | ICD-10-CM | POA: Diagnosis not present

## 2018-08-04 DIAGNOSIS — Z79899 Other long term (current) drug therapy: Secondary | ICD-10-CM | POA: Insufficient documentation

## 2018-08-04 LAB — RETICULOCYTES
Immature Retic Fract: 14.6 % (ref 2.3–15.9)
RBC.: 5.31 MIL/uL — ABNORMAL HIGH (ref 3.87–5.11)
Retic Count, Absolute: 132.8 10*3/uL (ref 19.0–186.0)
Retic Ct Pct: 2.5 % (ref 0.4–3.1)

## 2018-08-04 LAB — CMP (CANCER CENTER ONLY)
ALT: 8 U/L (ref 0–44)
AST: 9 U/L — ABNORMAL LOW (ref 15–41)
Albumin: 4.2 g/dL (ref 3.5–5.0)
Alkaline Phosphatase: 60 U/L (ref 38–126)
Anion gap: 6 (ref 5–15)
BUN: 13 mg/dL (ref 6–20)
CO2: 26 mmol/L (ref 22–32)
Calcium: 9.1 mg/dL (ref 8.9–10.3)
Chloride: 106 mmol/L (ref 98–111)
Creatinine: 0.71 mg/dL (ref 0.44–1.00)
GFR, Est AFR Am: >60 mL/min (ref >60)
GFR, Estimated: >60 mL/min (ref >60)
Glucose, Bld: 90 mg/dL (ref 70–99)
Potassium: 4.7 mmol/L (ref 3.5–5.1)
Sodium: 138 mmol/L (ref 135–145)
Total Bilirubin: 0.6 mg/dL (ref 0.3–1.2)
Total Protein: 6.4 g/dL — ABNORMAL LOW (ref 6.5–8.1)

## 2018-08-04 LAB — CBC WITH DIFFERENTIAL (CANCER CENTER ONLY)
Abs Immature Granulocytes: 0.02 10*3/uL (ref 0.00–0.07)
Basophils Absolute: 0 10*3/uL (ref 0.0–0.1)
Basophils Relative: 1 %
Eosinophils Absolute: 0.1 10*3/uL (ref 0.0–0.5)
Eosinophils Relative: 2 %
HCT: 32.3 % — ABNORMAL LOW (ref 36.0–46.0)
Hemoglobin: 9.4 g/dL — ABNORMAL LOW (ref 12.0–15.0)
Immature Granulocytes: 0 %
Lymphocytes Relative: 57 %
Lymphs Abs: 3.4 10*3/uL (ref 0.7–4.0)
MCH: 17.7 pg — ABNORMAL LOW (ref 26.0–34.0)
MCHC: 29.1 g/dL — ABNORMAL LOW (ref 30.0–36.0)
MCV: 60.8 fL — ABNORMAL LOW (ref 80.0–100.0)
Monocytes Absolute: 0.3 10*3/uL (ref 0.1–1.0)
Monocytes Relative: 6 %
Neutro Abs: 2 10*3/uL (ref 1.7–7.7)
Neutrophils Relative %: 34 %
Platelet Count: 267 10*3/uL (ref 150–400)
RBC: 5.31 MIL/uL — ABNORMAL HIGH (ref 3.87–5.11)
RDW: 16 % — ABNORMAL HIGH (ref 11.5–15.5)
WBC Count: 5.8 10*3/uL (ref 4.0–10.5)
nRBC: 0 % (ref 0.0–0.2)

## 2018-08-04 NOTE — Progress Notes (Signed)
Hematology and Oncology Follow Up Visit  Abigail Hall [id number] 03/31/1981 37 y.o. 08/04/2018   Principle Diagnosis:  Thalassemia minor (beta thalassemia) History of iron deficiency anemia  Current Therapy:   Folic acid 2 mg by mouth daily IV iron as indicated - last received in May 2018   Interim History:  Abigail Hall is here today for follow-up.  She is doing quite well.  She had a wonderful Thanksgiving.  Unfortunately, she did not get the job with [employer].  She really is not too bothered by this.  She has another job interviews.  She is feeling well.  She is not hurting.  She is not having any arthralgias or myalgias.  When we last saw her in November, her ferritin was 919 with an iron saturation of 34%.  She has had no cough.  There is been no fever.  Her son had a temperature of 104.4.  He had a seizure.  He was found to have influenza B.  Thankfully, he is okay.  Overall, her performance status is ECOG 1.  Medications:  Allergies as of 08/04/2018      Reactions   Cephalexin Hives   Latex Itching, Swelling, Rash   Where touched.      Medication List       Accurate as of August 04, 2018  1:41 PM. Always use your most recent med list.        ALPRAZolam 1 MG tablet Commonly known as:  XANAX Take 1 mg by mouth 3 (three) times daily as needed for anxiety.   b complex vitamins tablet Take by mouth.   Biotin 10000 MCG Tbdp Take 10,000 mcg by mouth daily.   divalproex 500 MG DR tablet Commonly known as:  DEPAKOTE TAKE 2 TABLETS(1000 MG) BY MOUTH TWICE DAILY   Fluocinolone Acetonide Scalp 0.01 % Oil Apply 1 application topically daily as needed.   fluticasone 50 MCG/ACT nasal spray Commonly known as:  FLONASE Place 2 sprays daily into both nostrils.   folic acid 1 MG tablet Commonly known as:  FOLVITE Take 2 tablets (2 mg total) by mouth daily.   ketoconazole 2 % shampoo Commonly known as:  NIZORAL Apply 1 application topically 2  (two) times a week.   nitrofurantoin (macrocrystal-monohydrate) 100 MG capsule Commonly known as:  MACROBID Take 1 capsule (100 mg total) by mouth 2 (two) times daily.   polyethylene glycol powder powder Commonly known as:  MIRALAX Take 17 g by mouth daily.   PRE-NATAL FORMULA Tabs Take 1 tablet by mouth daily with breakfast.   traMADol 50 MG tablet Commonly known as:  ULTRAM TAKE 1 TO 2 TABLETS BY MOUTH EVERY 6 HOURS AS NEEDED   vitamin C 500 MG tablet Commonly known as:  ASCORBIC ACID Take 500 mg by mouth daily.       Allergies:  Allergies  Allergen Reactions  . Cephalexin Hives  . Latex Itching, Swelling and Rash    Where touched.    Past Medical History, Surgical history, Social history, and Family History were reviewed and updated.  Review of Systems: Review of Systems  Constitutional: Negative.   HENT: Negative.   Eyes: Negative.   Respiratory: Negative.   Cardiovascular: Negative.   Gastrointestinal: Negative.   Genitourinary: Negative.   Musculoskeletal: Positive for joint pain.  Skin: Negative.   Neurological: Negative.   Endo/Heme/Allergies: Negative.   Psychiatric/Behavioral: Negative.       Physical Exam:  weight is 200 lb (90.7 kg). Her oral  temperature is 98.4 F (36.9 C). Her blood pressure is 109/70 and her pulse is 71. Her respiration is 16 and oxygen saturation is 100%.   Wt Readings from Last 3 Encounters:  08/04/18 200 lb (90.7 kg)  06/23/18 198 lb 1.9 oz (89.9 kg)  05/05/18 198 lb (89.8 kg)    Physical Exam Vitals signs reviewed.  HENT:     Head: Normocephalic and atraumatic.  Eyes:     Pupils: Pupils are equal, round, and reactive to light.  Neck:     Musculoskeletal: Normal range of motion.  Cardiovascular:     Rate and Rhythm: Normal rate and regular rhythm.     Heart sounds: Normal heart sounds.  Pulmonary:     Effort: Pulmonary effort is normal.     Breath sounds: Normal breath sounds.  Abdominal:     General: Bowel  sounds are normal.     Palpations: Abdomen is soft.  Musculoskeletal: Normal range of motion.        General: No tenderness or deformity.  Lymphadenopathy:     Cervical: No cervical adenopathy.  Skin:    General: Skin is warm and dry.     Findings: No erythema or rash.  Neurological:     Mental Status: She is alert and oriented to person, place, and time.  Psychiatric:        Behavior: Behavior normal.        Thought Content: Thought content normal.        Judgment: Judgment normal.      Lab Results  Component Value Date   WBC 5.8 08/04/2018   HGB 9.4 (L) 08/04/2018   HCT 32.3 (L) 08/04/2018   MCV 60.8 (L) 08/04/2018   PLT 267 08/04/2018   Lab Results  Component Value Date   FERRITIN 919 (H) 06/23/2018   IRON 89 06/23/2018   TIBC 259 06/23/2018   UIBC 170 06/23/2018   IRONPCTSAT 34 06/23/2018   Lab Results  Component Value Date   RETICCTPCT 2.5 08/04/2018   RBC 5.31 (H) 08/04/2018   RBC 5.31 (H) 08/04/2018   RETICCTABS 202.8 (H) 05/24/2014   No results found for: KPAFRELGTCHN, LAMBDASER, KAPLAMBRATIO No results found for: IGGSERUM, IGA, IGMSERUM No results found for: Odetta Pink, SPEI   Chemistry      Component Value Date/Time   NA 138 08/04/2018 1300   NA 145 08/04/2017 0829   K 4.7 08/04/2018 1300   K 4.0 08/04/2017 0829   CL 106 08/04/2018 1300   CL 108 08/04/2017 0829   CO2 26 08/04/2018 1300   CO2 26 08/04/2017 0829   BUN 13 08/04/2018 1300   BUN 11 08/04/2017 0829   CREATININE 0.71 08/04/2018 1300   CREATININE 0.8 08/04/2017 0829      Component Value Date/Time   CALCIUM 9.1 08/04/2018 1300   CALCIUM 9.4 08/04/2017 0829   ALKPHOS 60 08/04/2018 1300   ALKPHOS 63 08/04/2017 0829   AST 9 (L) 08/04/2018 1300   ALT 8 08/04/2018 1300   ALT 14 08/04/2017 0829   BILITOT 0.6 08/04/2018 1300      Impression and Plan: Abigail Hall is a very pleasant 37 yo [name] with beta  thalassemia and 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  We will see what her iron studies show.  She may need a dose of IV iron.  I implored her to keep taking the folic acid.  She must take folic  acid.  We will plan to get her back now in 2 months.  Her hemoglobin is holding steady.  I am happy about this.  I know that she will have a wonderful Christmas and a wonderful and happy New Year.   [name], MD 12/20/20191:41 PM

## 2018-08-07 LAB — HEMOGLOBINOPATHY EVALUATION
Hgb A2 Quant: 4.6 % — ABNORMAL HIGH (ref 1.8–3.2)
Hgb A: 94.2 % — ABNORMAL LOW (ref 96.4–98.8)
Hgb C: 0 %
Hgb F Quant: 1.2 % (ref 0.0–2.0)
Hgb S Quant: 0 %
Hgb Variant: 0 %

## 2018-08-07 LAB — IRON AND TIBC
Iron: 54 ug/dL (ref 41–142)
Saturation Ratios: 20 % — ABNORMAL LOW (ref 21–57)
TIBC: 268 ug/dL (ref 236–444)
UIBC: 213 ug/dL (ref 120–384)

## 2018-08-07 LAB — FERRITIN: Ferritin: 1064 ng/mL — ABNORMAL HIGH (ref 11–307)

## 2018-08-29 DIAGNOSIS — K122 Cellulitis and abscess of mouth: Secondary | ICD-10-CM | POA: Diagnosis not present

## 2018-08-31 ENCOUNTER — Ambulatory Visit: Payer: BLUE CROSS/BLUE SHIELD | Admitting: Neurology

## 2018-08-31 ENCOUNTER — Encounter: Payer: Self-pay | Admitting: Neurology

## 2018-08-31 VITALS — BP 126/69 | HR 72 | Ht 64.0 in | Wt 201.0 lb

## 2018-08-31 DIAGNOSIS — M5416 Radiculopathy, lumbar region: Secondary | ICD-10-CM

## 2018-08-31 DIAGNOSIS — G40309 Generalized idiopathic epilepsy and epileptic syndromes, not intractable, without status epilepticus: Secondary | ICD-10-CM | POA: Diagnosis not present

## 2018-08-31 MED ORDER — FLUOCINOLONE ACETONIDE SCALP 0.01 % EX OIL: 1.0000 "application " | TOPICAL_OIL | Freq: Every day | CUTANEOUS | 11 refills | Status: DC | PRN

## 2018-08-31 MED ORDER — KETOCONAZOLE 2 % EX SHAM: 1.0000 "application " | MEDICATED_SHAMPOO | CUTANEOUS | 11 refills | Status: DC

## 2018-08-31 MED ORDER — LEVETIRACETAM ER 500 MG PO TB24: 1000.0000 mg | ORAL_TABLET | Freq: Two times a day (BID) | ORAL | 11 refills | Status: DC

## 2018-08-31 NOTE — Patient Instructions (Signed)
Start Keppra XR 1000mg  twice daily After 4 weeks decrease Depakote to 500mg  daily Will see you in 2 months  Levetiracetam extended-release tablets What is this medicine? LEVETIRACETAM (lee ve tye RA se tam) is an antiepileptic drug. It is used with other medicines to treat certain types of seizures. This medicine may be used for other purposes; ask your health care provider or pharmacist if you have questions. COMMON BRAND NAME(S): Keppra XR, Roweepra What should I tell my health care provider before I take this medicine? They need to know if you have any of these conditions: -kidney disease -suicidal thoughts, plans, or attempt; a previous suicide attempt by you or a family member -an unusual or allergic reaction to levetiracetam, other medicines, foods, dyes, or preservatives -pregnant or trying to get pregnant -breast-feeding How should I use this medicine? Take this medicine by mouth with a glass of water. Follow the directions on the prescription label. Do not cut, crush or chew this medicine. You may take this medicine with or without food. Take your doses at regular intervals. Do not take your medicine more often than directed. Do not stop taking this medicine or any of your seizure medicines unless instructed by your doctor or health care professional. Stopping your medicine suddenly can increase your seizures or their severity. A special MedGuide will be given to you by the pharmacist with each prescription and refill. Be sure to read this information carefully each time. Contact your pediatrician or health care professional regarding the use of this medication in children. While this drug may be prescribed for children as young as 73 years of age for selected conditions, precautions do apply. Overdosage: If you think you have taken too much of this medicine contact a poison control center or emergency room at once. NOTE: This medicine is only for you. Do not share this medicine with  others. What if I miss a dose? If you miss a dose and it has only been a few hours, take it as soon as you can. If it is almost time for your next dose, take only that dose. Do not take double or extra doses. What may interact with this medicine? This medicine may interact with the following medications: -carbamazepine -colesevelam -probenecid -sevelamer This list may not describe all possible interactions. Give your health care provider a list of all the medicines, herbs, non-prescription drugs, or dietary supplements you use. Also tell them if you smoke, drink alcohol, or use illegal drugs. Some items may interact with your medicine. What should I watch for while using this medicine? Visit your doctor or health care professional for a regular check on your progress. Wear a medical identification bracelet or chain to say you have epilepsy, and carry a card that lists all your medications. It is important to take this medicine exactly as instructed by your health care professional. When first starting treatment, your dose may need to be adjusted. It may take weeks or months before your dose is stable. You should contact your doctor or health care professional if your seizures get worse or if you have any new types of seizures. You may get drowsy or dizzy. Do not drive, use machinery, or do anything that needs mental alertness until you know how this medicine affects you. Do not stand or sit up quickly, especially if you are an older patient. This reduces the risk of dizzy or fainting spells. Alcohol may interfere with the effect of this medicine. Avoid alcoholic drinks. The use of  this medicine may increase the chance of suicidal thoughts or actions. Pay special attention to how you are responding while on this medicine. Any worsening of mood, or thoughts of suicide or dying should be reported to your health care professional right away. The tablet shell for some brands of this medicine does not  dissolve. This is normal. The tablet shell may appear in the stool. This is not cause for concern. Women who become pregnant while using this medicine may enroll in the [place] Pregnancy Registry by calling [phone]. This registry collects information about the safety of antiepileptic drug use during pregnancy. What side effects may I notice from receiving this medicine? Side effects that you should report to your doctor or health care professional as soon as possible: -allergic reactions like skin rash, itching or hives, swelling of the face, lips, or tongue -breathing problems -changes in emotions or moods -dark urine -general ill feeling or flu-like symptoms -problems with balance, talking, walking -suicidal thoughts or actions -unusually weak or tired -yellowing of the eyes or skin Side effects that usually do not require medical attention (report to your doctor or health care professional if they continue or are bothersome): -diarrhea -dizzy, drowsy -headache -loss of appetite This list may not describe all possible side effects. Call your doctor for medical advice about side effects. You may report side effects to FDA at 1-800-FDA-1088. Where should I keep my medicine? Keep out of reach of children. Store at room temperature between 15 and 30 degrees C (59 and 86 degrees F). Throw away any unused medicine after the expiration date. NOTE: This sheet is a summary. It may not cover all possible information. If you have questions about this medicine, talk to your doctor, pharmacist, or health care provider.  2019 [employer]/Gold Standard (2015-09-04 09:48:52)

## 2018-08-31 NOTE — Progress Notes (Signed)
[facility]    Provider:  Dr Jaynee Eagles Referring Provider: No ref. provider found Primary Care Physician:  Patient, No Pcp Per  CC: Lumbar radiculopathy and seizure disorder  Interval history 08/31/2018: Patient is here for lumbar radiculopathy and seizure disorder.  She has not had a seizure.  She is well controlled on Depakote however she does not like the side effects.  In the past she is tried multiple medications she had side effects to topiramate, she was tried on Lamictal and Vimpat and several others.  She was on Keppra at one point and was doing well but after the birth of her child she had seizures and she was switched back to Depakote.  She like to consider Keppra again.  Discussed that changing medications include the risk of seizure breakthrough and she will have to be on 2 seizure medications for several months while we transition.  She does understand the risks and would like to try Keppra.  I discussed would like to try her on a high-dose Keppra XR at thousand milligrams twice daily.  I do like dosing Keppra XR twice daily even though it is a once daily extended release formulation. Lumbar radic improved.  Interval history: Patient stopped the topiramate and she feels much better. Lamictal and Keppra did not help her in the past. We'll continue Depakote at this time. Her biggest concern is weight gain especially on the Depakote. Recommended a healthy weight and wellness Center. Discussed obesity. Discussed diet and exercise. She has scalp psoriasis, can give her some shampoo but recommend pcp or derm.  Interval History 11/09/2016: Patient returns for follow-up on seizure disorder. She has been on Depakote. At last appointment she wanted to switch agents due to weight gain side placed her on topiramate. She has been on Keppra in the past during pregnancy. Lamictal did not work for her in the past. Recent evaluation by primary care showed that patient's beta thalassemia  is the biggest issue affecting her hemoglobin and NCV at this time, she was restarted on folic acid to try to help improve her counts. This could be the cause of her significant fatigue and weakness over the last month. Other causes can be medication side effects of topiramate or being on both topiramate and Depakote. Hair loss can be caused by topiramate. We can stop the Topiramate. She doesn't know if she is snoring. Just started over the last month. She has been missing work due to being so tired. Was hoping to switch Depakote for Topiramate but need to stop and see if this is causing side effects. Patient crying in the office today, requesting disability for this week for me, endorses multiple times that she is taking the topiramate. States states she last took topiramate last evening. Will check a Topiramate level.  Interval history 09/23/2016: She was evaluated by [facility] for right L5 radic and has a CT myelogram and three is no surgical intervention at this time there is no nerve root impingement. No Seizures. She is doing well on her Depakote and no seizures. Gabapentin did not help and the pain comes and goes. The last time she had a seizure was last march. She has been on the Depakote "forever". She was on Keppra for her pregnancy, she is not planning on having anymore kids and had a hysterectomy. Depakote works for her but the weight gain is significant and she would like to try a different AED. Discussed risks including breakthrough seizures, discussed choices, Topamax is a good  AED but often needs higher doses that can cause side effects. Lamictal does not work for her.   FGH:[id number] Poindexteris a 38 y.o.femalehere as a referral from Dr. Karlton Lemon MD for right leg pain and radiculopathy. Ongoing since 2015 no inciting events or trauma. She has had ESI which initially helped a little but not working anymore. She is having difficulty with weight gain since she cannot work out especially on  Depakote for her seizure disorder. She can't work out because of the right leg weakness and the pain. She has leg pain with radicular symptoms into the right leg with numbness of the foot. Foot numbness since August. The pain starts in the upper right leg and radiates down the side and back of the thigh and to the lateral lower leg and into the foot. Her leg gives out, weak. Most painful when in a position for a long time such as sitting in one position for a long time. At one point there was so much pain she couldn't even sit. Muscle relaxers don't help, hydrocodone help a little bit but not on it now and doesn't want to take it , hard to sleep because she wakes with pain. Her right foot is numb in the toes and in the lateral aspect of the foot. She can' t feel the bottom of her heel, continuously numb, she can;t drive for long periods. She gets cramps in the right foot. No changes in bowel or bladder.   Reviewed notes, labs and imaging from outside physicians, which showed:  BUN 8, creatinine 0.65 05/2014, TSH 1.552 10/2011  MRI lumbar spine 04/2016: personally reviewed images and agree with the following  Disc levels:  L1-2: Negative.  L2-3: Normal interspace. No disc bulge or disc protrusion. Left-sided facet arthrosis. No canal or foraminal stenosis.  L3-4: Negative.  L4-5: Mild diffuse degenerative disc bulge with disc desiccation, stable. No focal disc herniation. Mild bilateral facet arthrosis, right worse than left. No canal stenosis. Mild bilateral foraminal narrowing, stable.  L5-S1: Negative. Aic 6 IMPRESSION: 1. Shallow broad-based disc protrusion at L4-5 with resultant mild bilateral foraminal narrowing, stable from prior. No other significant degenerative disc disease. No new focal disc herniation. 2. Mild facet arthrosis at L2-3 and L4-5 as above.  Review of Systems: Patient complains of symptoms per HPI as well as the following symptoms: swelling in legs,  anemia, joint pain, cramps, aching muscles, headache, numbness, weakness, restless legs. Pertinent negatives per HPI. All others negative.  Review of Systems: Patient complains of symptoms per HPI as well as the following symptoms: swelling in legs, anemia, joint pain, cramps, aching muscles, headache, numbness, weakness, restless legs. Pertinent negatives per HPI. All others negative     Social History   Socioeconomic History  . Marital status: Single    Spouse name: Not on file  . Number of children: 1  . Years of education: Some College  . Highest education level: Not on file  Occupational History  . Occupation: [employer]  Social Needs  . Financial resource strain: Not on file  . Food insecurity:    Worry: Not on file    Inability: Not on file  . Transportation needs:    Medical: Not on file    Non-medical: Not on file  Tobacco Use  . Smoking status: Never Smoker  . Smokeless tobacco: Never Used  Substance and Sexual Activity  . Alcohol use: Yes    Alcohol/week: 0.0 standard drinks    Comment: occasionally, socially, not even  weekly  . Drug use: No  . Sexual activity: Not on file  Lifestyle  . Physical activity:    Days per week: Not on file    Minutes per session: Not on file  . Stress: Not on file  Relationships  . Social connections:    Talks on phone: Not on file    Gets together: Not on file    Attends religious service: Not on file    Active member of club or organization: Not on file    Attends meetings of clubs or organizations: Not on file    Relationship status: Not on file  . Intimate partner violence:    Fear of current or ex partner: Not on file    Emotionally abused: Not on file    Physically abused: Not on file    Forced sexual activity: Not on file  Other Topics Concern  . Not on file  Social History Narrative   Lives at home with her son   Caffeine use: none   Right-handed    Family History  Problem Relation Age of Onset  . Diabetes Mother    . Hyperlipidemia Father   . Cancer Maternal Grandfather   . Hyperlipidemia Paternal Grandfather   . Diabetes Maternal Grandmother   . Anesthesia problems Neg Hx   . Hypotension Neg Hx   . Malignant hyperthermia Neg Hx   . Pseudochol deficiency Neg Hx     Past Medical History:  Diagnosis Date  . Anemia   . Anemia, iron deficiency 04/10/2012  . Constipation   . Depression 04/26/13   history - no current problems  . Generalized headaches   . Hemorrhoids   . History of blood transfusion 02/2013   [facility]  2 units transfused   . Nausea & vomiting    resolved after delivery - C/S, extreme  . Rectal bleeding   . Rectal pain   . Seasonal allergies   . Seizures ([place])    last seizure was  08/2012  . Thalassemia   . Weakness    resolved after C/S delivery    Past Surgical History:  Procedure Laterality Date  . ABDOMINAL HYSTERECTOMY    . CARPAL TUNNEL RELEASE  01/2009   right  . CESAREAN SECTION  12/31/2011   Procedure: CESAREAN SECTION;  Surgeon: Jolayne Haines, MD;  Location: [facility];  Service: Gynecology;  Laterality: N/A;  . DILATION AND CURETTAGE OF UTERUS     endometrial ablation  . EVALUATION UNDER ANESTHESIA WITH HEMORRHOIDECTOMY N/A 05/31/2013   Procedure: EXAM UNDER ANESTHESIA WITH EXTERNAL HEMORRHOIDECTOMY;  Surgeon: Adin Hector, MD;  Location: [facility];  Service: General;  Laterality: N/A;  . LABIOPLASTY  03/27/2012   Procedure: LABIAPLASTY;  Surgeon: Jolayne Haines, MD;  Location: [facility];  Service: Gynecology;  Laterality: N/A;  labia  . LAPAROSCOPIC TUBAL LIGATION  03/27/2012   Procedure: LAPAROSCOPIC TUBAL LIGATION;  Surgeon: Jolayne Haines, MD;  Location: [facility];  Service: Gynecology;  Laterality: Bilateral;  fallopian tubes caurtery  . LAPAROSCOPY     removal of cyst  . ROBOTIC ASSISTED TOTAL HYSTERECTOMY N/A 05/02/2013   Procedure: ROBOTIC ASSISTED TOTAL HYSTERECTOMY;  Surgeon: Marvene Staff, MD;  Location: [facility];  Service: Gynecology;  Laterality:  N/A;  . TONGUE SURGERY    . TRANSANAL HEMORRHOIDAL DEARTERIALIZATION N/A 05/31/2013   Procedure: [place] HEMORRHOIDAL LIGATION/PEXY;  Surgeon: Adin Hector, MD;  Location: [facility];  Service: General;  Laterality: N/A;  . TUBAL LIGATION    .  UNILATERAL SALPINGECTOMY Right 05/02/2013   Procedure: UNILATERAL SALPINGECTOMY;  Surgeon: Marvene Staff, MD;  Location: [facility];  Service: Gynecology;  Laterality: Right;  . WISDOM TOOTH EXTRACTION      Current Outpatient Medications  Medication Sig Dispense Refill  . ALPRAZolam (XANAX) 1 MG tablet Take 1 mg by mouth 3 (three) times daily as needed for anxiety.    . Biotin 10000 MCG TBDP Take 10,000 mcg by mouth daily.    . divalproex (DEPAKOTE) 500 MG DR tablet TAKE 2 TABLETS(1000 MG) BY MOUTH TWICE DAILY 120 tablet 5  . Fluocinolone Acetonide Scalp 0.01 % OIL Apply 1 application topically daily as needed. 118 mL 11  . fluticasone (FLONASE) 50 MCG/ACT nasal spray Place 2 sprays daily into both nostrils. (Patient taking differently: Place 2 sprays into both nostrils as needed. ) 16 g 1  . ketoconazole (NIZORAL) 2 % shampoo Apply 1 application topically 2 (two) times a week. 120 mL 11  . polyethylene glycol powder (MIRALAX) powder Take 17 g by mouth daily. 255 g 6  . Prenatal Multivit-Min-Fe-FA (PRE-NATAL FORMULA) TABS Take 1 tablet by mouth daily with breakfast. 30 each 12  . vitamin C (ASCORBIC ACID) 500 MG tablet Take 500 mg by mouth daily.    Marland Kitchen levETIRAcetam (KEPPRA XR) 500 MG 24 hr tablet Take 2 tablets (1,000 mg total) by mouth 2 (two) times daily. 120 tablet 11  . nitrofurantoin, macrocrystal-monohydrate, (MACROBID) 100 MG capsule Take 1 capsule (100 mg total) by mouth 2 (two) times daily. (Patient not taking: Reported on 08/31/2018) 30 capsule 3   No current facility-administered medications for this visit.     Allergies as of 08/31/2018 - Review Complete 08/31/2018  Allergen Reaction Noted  . Cephalexin Hives 02/28/2013  . Latex Itching,  Swelling, and Rash 05/24/2011    Vitals: BP 126/69 (BP Location: Right Arm, Patient Position: Sitting)   Pulse 72   Ht 5\' 4"  (1.626 m)   Wt 201 lb (91.2 kg)   LMP 04/25/2013   BMI 34.50 kg/m  Last Weight:  Wt Readings from Last 1 Encounters:  08/31/18 201 lb (91.2 kg)   Last Height:   Ht Readings from Last 1 Encounters:  08/31/18 5\' 4"  (1.626 m)       Neuro: Detailed Neurologic Exam  Speech: Speech is normal; fluent and spontaneous with normal comprehension.  Cognition: The patient is oriented to person, place, and time;  recent and remote memory intact;  language fluent;  normal attention, concentration,  fund of knowledge Cranial Nerves: The pupils are equal, round, and reactive to light. The fundi are normal and spontaneous venous pulsations are present. Visual fields are full to finger confrontation. Extraocular movements are intact. Trigeminal sensation is intact and the muscles of mastication are normal. The face is symmetric. The palate elevates in the midline. Hearing intact. Voice is normal. Shoulder shrug is normal. The tongue has normal motion without fasciculations.   Coordination: Normal finger to nose and heel to shin. Normal rapid alternating movements.   Gait: Heel-toe and tandem gait are normal.   Motor Observation: No asymmetry, no atrophy, and no involuntary movements noted. Tone: Normal muscle tone.   Posture: Posture is normal. normal erect  Strength:  weakness right hip flexion, DF and leg flexion, inversion and eversion otherwise strength is V/V in the upper and lower limbs.   Sensation: decreased in an L5 distribution of the right leg  Reflex Exam:  DTR's: Possibly decreased reflex on the right AJ  Toes:  The toes are downgoing bilaterally.  Clonus: Clonus is absent.  Assessment/Plan:38 year old patient with likely L5 radiculopathy. She has weakness right leg flexion,  dorsiflexion and inversion and eversion of the right foot, she has radicular pain and decreased sensation in an L5 distribution. She has followed at [facility] for this and she is improved after weight loss. Also with seizure disorder. On Depakote but wants a different agent. She has tried and failed Lamictal, topiramate, vimpat. Depakote causing significant weight gain. Advised that switching AEDs could cause breakthrough seizures. Switched to topiramate and patient became extremely fatigued, was losing her hair with emotional lability. Since stopping the topiramate she feels much better.  Start Keppra XR 1000mg  twice daily After 4 weeks decrease Depakote to 500mg  daily Will see you in 2 months LBP improved with weight loss Dexa Scan as she has been on AEDs   Scalp dermatitis: ketoconazole shampoo and fluocinolone scalp oil  Sarina Ill, MD  [facility] [address] [place], [place] [address]  Phone [phone] Fax [phone]  A total of 25 minutes was spent face-to-face with this patient. 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 Lumbar radiculopathy    diagnosis and different diagnostic and therapeutic options, counseling and coordination of care, risks ans benefits of management, compliance, or risk factor reduction and education.

## 2018-09-01 DIAGNOSIS — F410 Panic disorder [episodic paroxysmal anxiety] without agoraphobia: Secondary | ICD-10-CM | POA: Diagnosis not present

## 2018-09-01 DIAGNOSIS — F3342 Major depressive disorder, recurrent, in full remission: Secondary | ICD-10-CM | POA: Diagnosis not present

## 2018-09-27 ENCOUNTER — Telehealth: Payer: Self-pay | Admitting: Neurology

## 2018-09-28 NOTE — Telephone Encounter (Signed)
error 

## 2018-10-05 ENCOUNTER — Inpatient Hospital Stay: Payer: BLUE CROSS/BLUE SHIELD

## 2018-10-05 ENCOUNTER — Inpatient Hospital Stay: Payer: BLUE CROSS/BLUE SHIELD | Attending: Hematology & Oncology | Admitting: Family

## 2018-10-05 VITALS — BP 122/68 | HR 80 | Temp 97.8°F | Ht 64.0 in | Wt 203.5 lb

## 2018-10-05 DIAGNOSIS — D563 Thalassemia minor: Secondary | ICD-10-CM | POA: Insufficient documentation

## 2018-10-05 DIAGNOSIS — R5383 Other fatigue: Secondary | ICD-10-CM | POA: Diagnosis not present

## 2018-10-05 DIAGNOSIS — D500 Iron deficiency anemia secondary to blood loss (chronic): Secondary | ICD-10-CM

## 2018-10-05 DIAGNOSIS — Z79899 Other long term (current) drug therapy: Secondary | ICD-10-CM | POA: Diagnosis not present

## 2018-10-05 DIAGNOSIS — D509 Iron deficiency anemia, unspecified: Secondary | ICD-10-CM | POA: Diagnosis not present

## 2018-10-05 LAB — CMP (CANCER CENTER ONLY)
ALT: 9 U/L (ref 0–44)
AST: 9 U/L — ABNORMAL LOW (ref 15–41)
Albumin: 4.2 g/dL (ref 3.5–5.0)
Alkaline Phosphatase: 54 U/L (ref 38–126)
Anion gap: 7 (ref 5–15)
BUN: 14 mg/dL (ref 6–20)
CO2: 26 mmol/L (ref 22–32)
Calcium: 9.7 mg/dL (ref 8.9–10.3)
Chloride: 108 mmol/L (ref 98–111)
Creatinine: 0.69 mg/dL (ref 0.44–1.00)
GFR, Est AFR Am: >60 mL/min
GFR, Estimated: >60 mL/min
Glucose, Bld: 111 mg/dL — ABNORMAL HIGH (ref 70–99)
Potassium: 4.8 mmol/L (ref 3.5–5.1)
Sodium: 141 mmol/L (ref 135–145)
Total Bilirubin: 0.5 mg/dL (ref 0.3–1.2)
Total Protein: 6.5 g/dL (ref 6.5–8.1)

## 2018-10-05 LAB — CBC WITH DIFFERENTIAL (CANCER CENTER ONLY)
Abs Immature Granulocytes: 0.02 K/uL (ref 0.00–0.07)
Basophils Absolute: 0 K/uL (ref 0.0–0.1)
Basophils Relative: 1 %
Eosinophils Absolute: 0.1 K/uL (ref 0.0–0.5)
Eosinophils Relative: 2 %
HCT: 32.6 % — ABNORMAL LOW (ref 36.0–46.0)
Hemoglobin: 9.7 g/dL — ABNORMAL LOW (ref 12.0–15.0)
Immature Granulocytes: 0 %
Lymphocytes Relative: 45 %
Lymphs Abs: 2.7 K/uL (ref 0.7–4.0)
MCH: 18.1 pg — ABNORMAL LOW (ref 26.0–34.0)
MCHC: 29.8 g/dL — ABNORMAL LOW (ref 30.0–36.0)
MCV: 60.8 fL — ABNORMAL LOW (ref 80.0–100.0)
Monocytes Absolute: 0.3 K/uL (ref 0.1–1.0)
Monocytes Relative: 6 %
Neutro Abs: 2.7 K/uL (ref 1.7–7.7)
Neutrophils Relative %: 46 %
Platelet Count: 246 K/uL (ref 150–400)
RBC: 5.36 MIL/uL — ABNORMAL HIGH (ref 3.87–5.11)
RDW: 16.5 % — ABNORMAL HIGH (ref 11.5–15.5)
WBC Count: 5.9 K/uL (ref 4.0–10.5)
nRBC: 0 % (ref 0.0–0.2)

## 2018-10-05 LAB — IRON AND TIBC
Iron: 76 ug/dL (ref 41–142)
Saturation Ratios: 27 % (ref 21–57)
TIBC: 280 ug/dL (ref 236–444)
UIBC: 204 ug/dL (ref 120–384)

## 2018-10-05 LAB — FERRITIN: Ferritin: 1254 ng/mL — ABNORMAL HIGH (ref 11–307)

## 2018-10-05 LAB — RETIC PANEL
Immature Retic Fract: 17.7 % — ABNORMAL HIGH (ref 2.3–15.9)
RBC.: 5.36 MIL/uL — ABNORMAL HIGH (ref 3.87–5.11)
Retic Count, Absolute: 122.7 K/uL (ref 19.0–186.0)
Retic Ct Pct: 2.3 % (ref 0.4–3.1)
Reticulocyte Hemoglobin: 18.9 pg — ABNORMAL LOW

## 2018-10-05 NOTE — Progress Notes (Signed)
Hematology and Oncology Follow Up Visit  Abigail Hall [id number] 10-21-1980 38 y.o. 10/05/2018   Principle Diagnosis:  Thalassemia minor (beta thalassemia) History of iron deficiency anemia  Current Therapy:   Folic acid 2 mg by mouth daily IV iron as indicated   Interim History:  Abigail Hall is here today for follow-up. She is having some fatigue at times.  No falls or syncopal episodes.  She has occasional nose bleeds that stop easily after a few minutes. She states that she has had these since she was a child but they are less often now. No bruising or petechiae.  She states that she is taking her folic acid daily as prescribed.  No fever, chills, n/v, cough, rash, dizziness, SOB, chest pain, palpitations, abdominal pain or changes in bowel or bladder habits.  No swelling, tenderness, numbness or tingling in her extremities.  No lymphadenopathy noted on exam.  She is eating well and staying hydrated. Her weight is stable.   ECOG Performance Status: 1 - Symptomatic but completely ambulatory  Medications:  Allergies as of 10/05/2018      Reactions   Cephalexin Hives   Latex Itching, Swelling, Rash   Where touched.      Medication List       Accurate as of October 05, 2018  8:44 AM. Always use your most recent med list.        ALPRAZolam 1 MG tablet Commonly known as:  XANAX Take 1 mg by mouth 3 (three) times daily as needed for anxiety.   Biotin 10000 MCG Tbdp Take 10,000 mcg by mouth daily.   divalproex 500 MG DR tablet Commonly known as:  DEPAKOTE TAKE 2 TABLETS(1000 MG) BY MOUTH TWICE DAILY   Fluocinolone Acetonide Scalp 0.01 % Oil Apply 1 application topically daily as needed.   fluticasone 50 MCG/ACT nasal spray Commonly known as:  FLONASE Place 2 sprays daily into both nostrils.   ketoconazole 2 % shampoo Commonly known as:  NIZORAL Apply 1 application topically 2 (two) times a week.   levETIRAcetam 500 MG 24 hr tablet Commonly known as:   KEPPRA XR Take 2 tablets (1,000 mg total) by mouth 2 (two) times daily.   nitrofurantoin (macrocrystal-monohydrate) 100 MG capsule Commonly known as:  MACROBID Take 1 capsule (100 mg total) by mouth 2 (two) times daily.   polyethylene glycol powder powder Commonly known as:  MIRALAX Take 17 g by mouth daily.   PRE-NATAL FORMULA Tabs Take 1 tablet by mouth daily with breakfast.   vitamin C 500 MG tablet Commonly known as:  ASCORBIC ACID Take 500 mg by mouth daily.       Allergies:  Allergies  Allergen Reactions  . Cephalexin Hives  . Latex Itching, Swelling and Rash    Where touched.    Past Medical History, Surgical history, Social history, and Family History were reviewed and updated.  Review of Systems: All other 10 point review of systems is negative.   Physical Exam:  height is 5\' 4"  (1.626 m) and weight is 203 lb 8 oz (92.3 kg). Her oral temperature is 97.8 F (36.6 C). Her blood pressure is 122/68 and her pulse is 80. Her oxygen saturation is 100%.   Wt Readings from Last 3 Encounters:  10/05/18 203 lb 8 oz (92.3 kg)  08/31/18 201 lb (91.2 kg)  08/04/18 200 lb (90.7 kg)    Ocular: Sclerae unicteric, pupils equal, round and reactive to light Ear-nose-throat: Oropharynx clear, dentition fair Lymphatic: No cervical, supraclavicular or  axillary adenopathy Lungs no rales or rhonchi, good excursion bilaterally Heart regular rate and rhythm, no murmur appreciated Abd soft, nontender, positive bowel sounds, no liver or spleen tip palpated on exam, no fluid wave  MSK no focal spinal tenderness, no joint edema Neuro: non-focal, well-oriented, appropriate affect Breasts: Deferred   Lab Results  Component Value Date   WBC 5.9 10/05/2018   HGB 9.7 (L) 10/05/2018   HCT 32.6 (L) 10/05/2018   MCV 60.8 (L) 10/05/2018   PLT 246 10/05/2018   Lab Results  Component Value Date   FERRITIN 1,064 (H) 08/04/2018   IRON 54 08/04/2018   TIBC 268 08/04/2018   UIBC 213  08/04/2018   IRONPCTSAT 20 (L) 08/04/2018   Lab Results  Component Value Date   RETICCTPCT 2.3 10/05/2018   RBC 5.36 (H) 10/05/2018   RBC 5.36 (H) 10/05/2018   RETICCTABS 202.8 (H) 05/24/2014   No results found for: KPAFRELGTCHN, LAMBDASER, KAPLAMBRATIO No results found for: IGGSERUM, IGA, IGMSERUM No results found for: Odetta Pink, SPEI   Chemistry      Component Value Date/Time   NA 138 08/04/2018 1300   NA 145 08/04/2017 0829   K 4.7 08/04/2018 1300   K 4.0 08/04/2017 0829   CL 106 08/04/2018 1300   CL 108 08/04/2017 0829   CO2 26 08/04/2018 1300   CO2 26 08/04/2017 0829   BUN 13 08/04/2018 1300   BUN 11 08/04/2017 0829   CREATININE 0.71 08/04/2018 1300   CREATININE 0.8 08/04/2017 0829      Component Value Date/Time   CALCIUM 9.1 08/04/2018 1300   CALCIUM 9.4 08/04/2017 0829   ALKPHOS 60 08/04/2018 1300   ALKPHOS 63 08/04/2017 0829   AST 9 (L) 08/04/2018 1300   ALT 8 08/04/2018 1300   ALT 14 08/04/2017 0829   BILITOT 0.6 08/04/2018 1300       Impression and Plan: Abigail Hall is a very pleasant 38 yo [name] with beta thalassemia and 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 She is symptomatic with fatigue.  We will see what her iron studies show and bring her back in for infusion if needed.  She will continues to take her folic acid daily.  We will plan to see her back in another 3 months for follow-up.  She will contact our office with any questions or concerns. We can certainly see her sooner if need be.   Abigail Peace, NP 2/20/20208:44 AM

## 2018-10-09 LAB — HEMOGLOBINOPATHY EVALUATION
Hgb A2 Quant: 4.8 % — ABNORMAL HIGH (ref 1.8–3.2)
Hgb A: 93.7 % — ABNORMAL LOW (ref 96.4–98.8)
Hgb C: 0 %
Hgb F Quant: 1.5 % (ref 0.0–2.0)
Hgb S Quant: 0 %
Hgb Variant: 0 %

## 2018-10-19 ENCOUNTER — Encounter: Payer: Self-pay | Admitting: *Deleted

## 2018-10-19 ENCOUNTER — Telehealth: Payer: Self-pay | Admitting: Neurology

## 2018-10-19 NOTE — Telephone Encounter (Signed)
Patient states she is feeling fine but is wanting Dr. Cathren Laine nurse to call her to discuss what to do further about medication.

## 2018-10-19 NOTE — Telephone Encounter (Signed)
Sent pt a message in mychart

## 2018-10-23 ENCOUNTER — Other Ambulatory Visit: Payer: Self-pay | Admitting: *Deleted

## 2018-10-23 MED ORDER — DIVALPROEX SODIUM 250 MG PO DR TAB: DELAYED_RELEASE_TABLET | ORAL | 0 refills | Status: DC

## 2018-10-23 NOTE — Telephone Encounter (Signed)
I spoke with the patient. She said that she is doing well, no seizures. She has been on Depakote 500 mg daily since 10/01/2018. She is on the keppra XR 1000 mg BID. She wanted to know if she should stop the depakote. I spoke with Dr.Ahern and advised patient to take 250 mg daily x 1 week then stop. Call for appointment if she has any issues. Pt verbalized understanding and appreciaiton. She is aware that a new prescription for the depakote will be sent to her pharmacy.  Divalproex 250 mg DR 1 tablet PO QD sent to [facility] per v.o. Dr. Jaynee Eagles, #30, refills 0.

## 2018-10-26 ENCOUNTER — Ambulatory Visit: Payer: Self-pay | Admitting: Neurology

## 2018-11-07 ENCOUNTER — Other Ambulatory Visit: Payer: Self-pay | Admitting: Neurology

## 2018-11-22 ENCOUNTER — Other Ambulatory Visit: Payer: Self-pay | Admitting: Neurology

## 2018-11-29 ENCOUNTER — Telehealth: Payer: Self-pay | Admitting: Neurology

## 2018-11-29 NOTE — Telephone Encounter (Addendum)
Pt will need phone visit to discuss (per Dr. Jaynee Eagles).

## 2018-11-29 NOTE — Telephone Encounter (Signed)
Pt is asking for a call from Dr Jaynee Eagles to discuss a seizure she had last night

## 2018-11-29 NOTE — Telephone Encounter (Signed)
Spoke with pt. Discussed phone visit with pt. She stated that she missed a few doses (2-3) of keppra and had already weaned off her depakote. She said she had a few depakote left over that she took and starting this morning resumed her keppra. Pt asked if an appt is necessary and could she be advised of what to do next.

## 2018-11-30 NOTE — Telephone Encounter (Signed)
I sent her an email actually. thanks

## 2018-12-03 ENCOUNTER — Other Ambulatory Visit: Payer: Self-pay | Admitting: Neurology

## 2018-12-03 MED ORDER — DIVALPROEX SODIUM 500 MG PO DR TAB: 1000.0000 mg | DELAYED_RELEASE_TABLET | Freq: Two times a day (BID) | ORAL | 4 refills | Status: DC

## 2019-01-01 ENCOUNTER — Inpatient Hospital Stay: Payer: BLUE CROSS/BLUE SHIELD

## 2019-01-01 ENCOUNTER — Inpatient Hospital Stay: Payer: BLUE CROSS/BLUE SHIELD | Admitting: Family

## 2019-01-02 ENCOUNTER — Inpatient Hospital Stay: Payer: BLUE CROSS/BLUE SHIELD | Attending: Hematology & Oncology

## 2019-01-02 ENCOUNTER — Inpatient Hospital Stay (HOSPITAL_BASED_OUTPATIENT_CLINIC_OR_DEPARTMENT_OTHER): Payer: BLUE CROSS/BLUE SHIELD | Admitting: Family

## 2019-01-02 ENCOUNTER — Other Ambulatory Visit: Payer: Self-pay

## 2019-01-02 VITALS — BP 118/65 | HR 71 | Temp 98.0°F | Resp 20 | Wt 202.5 lb

## 2019-01-02 DIAGNOSIS — Z79899 Other long term (current) drug therapy: Secondary | ICD-10-CM

## 2019-01-02 DIAGNOSIS — D509 Iron deficiency anemia, unspecified: Secondary | ICD-10-CM | POA: Diagnosis not present

## 2019-01-02 DIAGNOSIS — D563 Thalassemia minor: Secondary | ICD-10-CM | POA: Diagnosis not present

## 2019-01-02 DIAGNOSIS — R5383 Other fatigue: Secondary | ICD-10-CM | POA: Diagnosis not present

## 2019-01-02 DIAGNOSIS — R202 Paresthesia of skin: Secondary | ICD-10-CM | POA: Diagnosis not present

## 2019-01-02 DIAGNOSIS — D500 Iron deficiency anemia secondary to blood loss (chronic): Secondary | ICD-10-CM

## 2019-01-02 DIAGNOSIS — R6883 Chills (without fever): Secondary | ICD-10-CM

## 2019-01-02 DIAGNOSIS — G40909 Epilepsy, unspecified, not intractable, without status epilepticus: Secondary | ICD-10-CM | POA: Diagnosis not present

## 2019-01-02 DIAGNOSIS — Z9884 Bariatric surgery status: Secondary | ICD-10-CM | POA: Diagnosis not present

## 2019-01-02 DIAGNOSIS — K909 Intestinal malabsorption, unspecified: Secondary | ICD-10-CM | POA: Diagnosis not present

## 2019-01-02 LAB — CBC WITH DIFFERENTIAL (CANCER CENTER ONLY)
Abs Immature Granulocytes: 0.01 10*3/uL (ref 0.00–0.07)
Basophils Absolute: 0 10*3/uL (ref 0.0–0.1)
Basophils Relative: 1 %
Eosinophils Absolute: 0.2 10*3/uL (ref 0.0–0.5)
Eosinophils Relative: 3 %
HCT: 30.8 % — ABNORMAL LOW (ref 36.0–46.0)
Hemoglobin: 9.1 g/dL — ABNORMAL LOW (ref 12.0–15.0)
Immature Granulocytes: 0 %
Lymphocytes Relative: 46 %
Lymphs Abs: 2.8 10*3/uL (ref 0.7–4.0)
MCH: 17.9 pg — ABNORMAL LOW (ref 26.0–34.0)
MCHC: 29.5 g/dL — ABNORMAL LOW (ref 30.0–36.0)
MCV: 60.6 fL — ABNORMAL LOW (ref 80.0–100.0)
Monocytes Absolute: 0.3 10*3/uL (ref 0.1–1.0)
Monocytes Relative: 5 %
Neutro Abs: 2.7 10*3/uL (ref 1.7–7.7)
Neutrophils Relative %: 45 %
Platelet Count: 270 10*3/uL (ref 150–400)
RBC: 5.08 MIL/uL (ref 3.87–5.11)
RDW: 16.2 % — ABNORMAL HIGH (ref 11.5–15.5)
WBC Count: 6 10*3/uL (ref 4.0–10.5)
nRBC: 0 % (ref 0.0–0.2)

## 2019-01-02 LAB — CMP (CANCER CENTER ONLY)
ALT: 7 U/L (ref 0–44)
AST: 10 U/L — ABNORMAL LOW (ref 15–41)
Albumin: 4 g/dL (ref 3.5–5.0)
Alkaline Phosphatase: 50 U/L (ref 38–126)
Anion gap: 5 (ref 5–15)
BUN: 12 mg/dL (ref 6–20)
CO2: 27 mmol/L (ref 22–32)
Calcium: 8.6 mg/dL — ABNORMAL LOW (ref 8.9–10.3)
Chloride: 109 mmol/L (ref 98–111)
Creatinine: 0.67 mg/dL (ref 0.44–1.00)
GFR, Est AFR Am: >60 mL/min (ref >60)
GFR, Estimated: >60 mL/min (ref >60)
Glucose, Bld: 94 mg/dL (ref 70–99)
Potassium: 4.7 mmol/L (ref 3.5–5.1)
Sodium: 141 mmol/L (ref 135–145)
Total Bilirubin: 0.4 mg/dL (ref 0.3–1.2)
Total Protein: 6.1 g/dL — ABNORMAL LOW (ref 6.5–8.1)

## 2019-01-02 LAB — RETICULOCYTES
Immature Retic Fract: 16.6 % — ABNORMAL HIGH (ref 2.3–15.9)
RBC.: 5.03 MIL/uL (ref 3.87–5.11)
Retic Count, Absolute: 124.7 10*3/uL (ref 19.0–186.0)
Retic Ct Pct: 2.5 % (ref 0.4–3.1)

## 2019-01-02 NOTE — Progress Notes (Signed)
Hematology and Oncology Follow Up Visit  Abigail Hall [id number] 11/26/1980 38 y.o. 01/02/2019   Principle Diagnosis:  Thalassemia minor (beta thalassemia) History of iron deficiency anemia  Current Therapy:   Folic acid 2 mg by mouth daily - patient stopped herself IV iron as indicated   Interim History:  Abigail Hall is here today for follow-up. She states that she had a breakthrough seizure in April while adjusting her doses of Keppra and Depakote. She had been exercising at the time with weights and is still a little sore in her arms and back. She also has some tingling in the right hand that comes and goes. The abrasions to her right forehead and foot have healed nicely.  No swelling in her extremities.  She has a tele follow-up visit with Dr. Jaynee Eagles.  She is symptomatic right now with fatigue and chills. Hgb is 9.1, MCV 60.  She does not take the folic acid because she does not feel it helps.  No episodes of bleeding, no bruising or petechiae.  No fever, n/v, cough, rash, dizziness, SOB, chest pain, palpitations, abdominal pain or changes in bowel or bladder habits.  No lymphadenopathy noted.   She has maintained a good appetite and is staying well hydrated. Her weight is stable.   ECOG Performance Status: 1 - Symptomatic but completely ambulatory  Medications:  Allergies as of 01/02/2019      Reactions   Cephalexin Hives   Latex Itching, Swelling, Rash   Where touched.      Medication List       Accurate as of Jan 02, 2019  2:15 PM. If you have any questions, ask your nurse or doctor.        ALPRAZolam 1 MG tablet Commonly known as:  XANAX Take 1 mg by mouth 3 (three) times daily as needed for anxiety.   Biotin 10000 MCG Tbdp Take 10,000 mcg by mouth daily.   divalproex 500 MG DR tablet Commonly known as:  DEPAKOTE Take 2 tablets (1,000 mg total) by mouth 2 (two) times daily.   Fluocinolone Acetonide Scalp 0.01 % Oil Apply 1 application topically  daily as needed.   fluticasone 50 MCG/ACT nasal spray Commonly known as:  FLONASE Place 2 sprays daily into both nostrils. What changed:    when to take this  reasons to take this   ketoconazole 2 % shampoo Commonly known as:  NIZORAL Apply 1 application topically 2 (two) times a week.   nitrofurantoin (macrocrystal-monohydrate) 100 MG capsule Commonly known as:  Macrobid Take 1 capsule (100 mg total) by mouth 2 (two) times daily.   polyethylene glycol powder 17 GM/SCOOP powder Commonly known as:  MiraLax Take 17 g by mouth daily.   Pre-Natal Formula Tabs Take 1 tablet by mouth daily with breakfast.   vitamin C 500 MG tablet Commonly known as:  ASCORBIC ACID Take 500 mg by mouth daily.       Allergies:  Allergies  Allergen Reactions  . Cephalexin Hives  . Latex Itching, Swelling and Rash    Where touched.    Past Medical History, Surgical history, Social history, and Family History were reviewed and updated.  Review of Systems: All other 10 point review of systems is negative.   Physical Exam:  weight is 202 lb 8 oz (91.9 kg). Her oral temperature is 98 F (36.7 C). Her blood pressure is 118/65 and her pulse is 71. Her respiration is 20 and oxygen saturation is 100%.   Wt Readings from  Last 3 Encounters:  01/02/19 202 lb 8 oz (91.9 kg)  10/05/18 203 lb 8 oz (92.3 kg)  08/31/18 201 lb (91.2 kg)    Ocular: Sclerae unicteric, pupils equal, round and reactive to light Ear-nose-throat: Oropharynx clear, dentition fair Lymphatic: No cervical or supraclavicular adenopathy Lungs no rales or rhonchi, good excursion bilaterally Heart regular rate and rhythm, no murmur appreciated Abd soft, nontender, positive bowel sounds, no liver or spleen tip palpated on exam, no fluid wave  MSK no focal spinal tenderness, no joint edema Neuro: non-focal, well-oriented, appropriate affect Breasts: Deferred   Lab Results  Component Value Date   WBC 6.0 01/02/2019   HGB 9.1  (L) 01/02/2019   HCT 30.8 (L) 01/02/2019   MCV 60.6 (L) 01/02/2019   PLT 270 01/02/2019   Lab Results  Component Value Date   FERRITIN 1,254 (H) 10/05/2018   IRON 76 10/05/2018   TIBC 280 10/05/2018   UIBC 204 10/05/2018   IRONPCTSAT 27 10/05/2018   Lab Results  Component Value Date   RETICCTPCT 2.5 01/02/2019   RBC 5.08 01/02/2019   RBC 5.03 01/02/2019   RETICCTABS 202.8 (H) 05/24/2014   No results found for: KPAFRELGTCHN, LAMBDASER, KAPLAMBRATIO No results found for: IGGSERUM, IGA, IGMSERUM No results found for: Odetta Pink, SPEI   Chemistry      Component Value Date/Time   NA 141 10/05/2018 0820   NA 145 08/04/2017 0829   K 4.8 10/05/2018 0820   K 4.0 08/04/2017 0829   CL 108 10/05/2018 0820   CL 108 08/04/2017 0829   CO2 26 10/05/2018 0820   CO2 26 08/04/2017 0829   BUN 14 10/05/2018 0820   BUN 11 08/04/2017 0829   CREATININE 0.69 10/05/2018 0820   CREATININE 0.8 08/04/2017 0829      Component Value Date/Time   CALCIUM 9.7 10/05/2018 0820   CALCIUM 9.4 08/04/2017 0829   ALKPHOS 54 10/05/2018 0820   ALKPHOS 63 08/04/2017 0829   AST 9 (L) 10/05/2018 0820   ALT 9 10/05/2018 0820   ALT 14 08/04/2017 0829   BILITOT 0.5 10/05/2018 0820       Impression and Plan: Abigail Hall is a very pleasant 38 yo [name] with beta thalassemia and 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  We will see what her iron studies show and bring her back in for infusion if needed.  We will plan to see her back in another 3 months.  She will contact our office with questions or concerns. We can certainly see her sooner if need be.   Laverna Peace, NP 5/19/20202:15 PM

## 2019-01-03 ENCOUNTER — Telehealth: Payer: Self-pay | Admitting: Family

## 2019-01-03 ENCOUNTER — Inpatient Hospital Stay: Payer: BLUE CROSS/BLUE SHIELD

## 2019-01-03 ENCOUNTER — Encounter: Payer: Self-pay | Admitting: Hematology & Oncology

## 2019-01-03 ENCOUNTER — Other Ambulatory Visit: Payer: Self-pay | Admitting: Family

## 2019-01-03 VITALS — BP 116/75 | HR 74 | Resp 20

## 2019-01-03 DIAGNOSIS — D500 Iron deficiency anemia secondary to blood loss (chronic): Secondary | ICD-10-CM

## 2019-01-03 DIAGNOSIS — D509 Iron deficiency anemia, unspecified: Secondary | ICD-10-CM | POA: Diagnosis not present

## 2019-01-03 DIAGNOSIS — R6883 Chills (without fever): Secondary | ICD-10-CM | POA: Diagnosis not present

## 2019-01-03 DIAGNOSIS — R5383 Other fatigue: Secondary | ICD-10-CM | POA: Diagnosis not present

## 2019-01-03 DIAGNOSIS — R202 Paresthesia of skin: Secondary | ICD-10-CM | POA: Diagnosis not present

## 2019-01-03 DIAGNOSIS — G40909 Epilepsy, unspecified, not intractable, without status epilepticus: Secondary | ICD-10-CM | POA: Diagnosis not present

## 2019-01-03 DIAGNOSIS — Z79899 Other long term (current) drug therapy: Secondary | ICD-10-CM | POA: Diagnosis not present

## 2019-01-03 DIAGNOSIS — K909 Intestinal malabsorption, unspecified: Secondary | ICD-10-CM | POA: Diagnosis not present

## 2019-01-03 DIAGNOSIS — Z9884 Bariatric surgery status: Secondary | ICD-10-CM | POA: Diagnosis not present

## 2019-01-03 DIAGNOSIS — D563 Thalassemia minor: Secondary | ICD-10-CM | POA: Diagnosis not present

## 2019-01-03 LAB — IRON AND TIBC
Iron: 48 ug/dL (ref 41–142)
Saturation Ratios: 19 % — ABNORMAL LOW (ref 21–57)
TIBC: 254 ug/dL (ref 236–444)
UIBC: 206 ug/dL (ref 120–384)

## 2019-01-03 LAB — FERRITIN: Ferritin: 925 ng/mL — ABNORMAL HIGH (ref 11–307)

## 2019-01-03 MED ORDER — SODIUM CHLORIDE 0.9 % IV SOLN
Freq: Once | INTRAVENOUS | Status: AC
  Administered 2019-01-03: 13:00:00 via INTRAVENOUS
  Filled 2019-01-03: qty 250

## 2019-01-03 MED ORDER — SODIUM CHLORIDE 0.9 % IV SOLN
510.0000 mg | Freq: Once | INTRAVENOUS | Status: AC
  Administered 2019-01-03: 510 mg via INTRAVENOUS
  Filled 2019-01-03: qty 510

## 2019-01-03 NOTE — Patient Instructions (Signed)

## 2019-01-03 NOTE — Telephone Encounter (Signed)
Called and LMVM for patient with date/time per 5/19 los

## 2019-01-04 LAB — HEMOGLOBINOPATHY EVALUATION
Hgb A2 Quant: 4.7 % — ABNORMAL HIGH (ref 1.8–3.2)
Hgb A: 93.7 % — ABNORMAL LOW (ref 96.4–98.8)
Hgb C: 0 %
Hgb F Quant: 1.6 % (ref 0.0–2.0)
Hgb S Quant: 0 %
Hgb Variant: 0 %

## 2019-02-23 DIAGNOSIS — F3342 Major depressive disorder, recurrent, in full remission: Secondary | ICD-10-CM | POA: Diagnosis not present

## 2019-02-23 DIAGNOSIS — F410 Panic disorder [episodic paroxysmal anxiety] without agoraphobia: Secondary | ICD-10-CM | POA: Diagnosis not present

## 2019-04-06 ENCOUNTER — Inpatient Hospital Stay: Payer: BC Managed Care – PPO | Admitting: Hematology & Oncology

## 2019-04-06 ENCOUNTER — Inpatient Hospital Stay: Payer: BC Managed Care – PPO

## 2019-04-12 ENCOUNTER — Inpatient Hospital Stay (HOSPITAL_BASED_OUTPATIENT_CLINIC_OR_DEPARTMENT_OTHER): Payer: BC Managed Care – PPO | Admitting: Family

## 2019-04-12 ENCOUNTER — Encounter: Payer: Self-pay | Admitting: Family

## 2019-04-12 ENCOUNTER — Other Ambulatory Visit: Payer: Self-pay

## 2019-04-12 ENCOUNTER — Telehealth: Payer: Self-pay | Admitting: Family

## 2019-04-12 ENCOUNTER — Ambulatory Visit (HOSPITAL_BASED_OUTPATIENT_CLINIC_OR_DEPARTMENT_OTHER)
Admission: RE | Admit: 2019-04-12 | Discharge: 2019-04-12 | Disposition: A | Discharge status: Home / Self Care | Payer: BC Managed Care – PPO | Source: Ambulatory Visit | Attending: Family | Admitting: Family

## 2019-04-12 ENCOUNTER — Inpatient Hospital Stay: Payer: BC Managed Care – PPO | Attending: Hematology & Oncology

## 2019-04-12 VITALS — BP 120/66 | HR 83 | Temp 98.4°F | Resp 18 | Ht 64.0 in | Wt 200.9 lb

## 2019-04-12 DIAGNOSIS — R5383 Other fatigue: Secondary | ICD-10-CM | POA: Insufficient documentation

## 2019-04-12 DIAGNOSIS — M545 Low back pain, unspecified: Secondary | ICD-10-CM

## 2019-04-12 DIAGNOSIS — D563 Thalassemia minor: Secondary | ICD-10-CM | POA: Diagnosis not present

## 2019-04-12 DIAGNOSIS — D508 Other iron deficiency anemias: Secondary | ICD-10-CM

## 2019-04-12 DIAGNOSIS — D509 Iron deficiency anemia, unspecified: Secondary | ICD-10-CM | POA: Diagnosis not present

## 2019-04-12 DIAGNOSIS — R6883 Chills (without fever): Secondary | ICD-10-CM | POA: Insufficient documentation

## 2019-04-12 DIAGNOSIS — D500 Iron deficiency anemia secondary to blood loss (chronic): Secondary | ICD-10-CM

## 2019-04-12 DIAGNOSIS — Z79899 Other long term (current) drug therapy: Secondary | ICD-10-CM | POA: Diagnosis not present

## 2019-04-12 LAB — CMP (CANCER CENTER ONLY)
ALT: 8 U/L (ref 0–44)
AST: 10 U/L — ABNORMAL LOW (ref 15–41)
Albumin: 4.2 g/dL (ref 3.5–5.0)
Alkaline Phosphatase: 63 U/L (ref 38–126)
Anion gap: 7 (ref 5–15)
BUN: 13 mg/dL (ref 6–20)
CO2: 25 mmol/L (ref 22–32)
Calcium: 8.7 mg/dL — ABNORMAL LOW (ref 8.9–10.3)
Chloride: 108 mmol/L (ref 98–111)
Creatinine: 0.7 mg/dL (ref 0.44–1.00)
GFR, Est AFR Am: >60 mL/min (ref >60)
GFR, Estimated: >60 mL/min (ref >60)
Glucose, Bld: 92 mg/dL (ref 70–99)
Potassium: 4.4 mmol/L (ref 3.5–5.1)
Sodium: 140 mmol/L (ref 135–145)
Total Bilirubin: 0.6 mg/dL (ref 0.3–1.2)
Total Protein: 6.8 g/dL (ref 6.5–8.1)

## 2019-04-12 LAB — RETICULOCYTES
Immature Retic Fract: 15.2 % (ref 2.3–15.9)
RBC.: 5.24 MIL/uL — ABNORMAL HIGH (ref 3.87–5.11)
Retic Count, Absolute: 136.2 10*3/uL (ref 19.0–186.0)
Retic Ct Pct: 2.6 % (ref 0.4–3.1)

## 2019-04-12 LAB — CBC WITH DIFFERENTIAL (CANCER CENTER ONLY)
Abs Immature Granulocytes: 0.01 10*3/uL (ref 0.00–0.07)
Basophils Absolute: 0 10*3/uL (ref 0.0–0.1)
Basophils Relative: 1 %
Eosinophils Absolute: 0.1 10*3/uL (ref 0.0–0.5)
Eosinophils Relative: 2 %
HCT: 31.3 % — ABNORMAL LOW (ref 36.0–46.0)
Hemoglobin: 9.5 g/dL — ABNORMAL LOW (ref 12.0–15.0)
Immature Granulocytes: 0 %
Lymphocytes Relative: 50 %
Lymphs Abs: 3.4 10*3/uL (ref 0.7–4.0)
MCH: 18.4 pg — ABNORMAL LOW (ref 26.0–34.0)
MCHC: 30.4 g/dL (ref 30.0–36.0)
MCV: 60.5 fL — ABNORMAL LOW (ref 80.0–100.0)
Monocytes Absolute: 0.4 10*3/uL (ref 0.1–1.0)
Monocytes Relative: 6 %
Neutro Abs: 2.7 10*3/uL (ref 1.7–7.7)
Neutrophils Relative %: 41 %
Platelet Count: 278 10*3/uL (ref 150–400)
RBC: 5.17 MIL/uL — ABNORMAL HIGH (ref 3.87–5.11)
RDW: 16.7 % — ABNORMAL HIGH (ref 11.5–15.5)
WBC Count: 6.6 10*3/uL (ref 4.0–10.5)
nRBC: 0 % (ref 0.0–0.2)

## 2019-04-12 NOTE — Telephone Encounter (Signed)
Called and spoke with patient regarding appointments added per 8/27 los

## 2019-04-12 NOTE — Progress Notes (Signed)
Hematology and Oncology Follow Up Visit  Vesa Aloe [id number] [birth year] 38 y.o. 04/12/2019   Principle Diagnosis:  Thalassemia minor (beta thalassemia) History of iron deficiency anemia  Current Therapy:   Folic acid 2 mg by mouth daily - patient stopped herself IV iron as indicated   Interim History:  Ms. Perusse is here today for follow-up. She is feeling fatigue and having lower back pain and pain in her bottom when sitting.  She states that this has been going on since her seizure and fall when working out back in March.  She has occasional chills.  No bleeding, bruising or petechiae.  No fever, n/v, cough, rash, dizziness, SOB, chest pain, palpitations, abdominal pain or changes in bowel or bladder habits.  No swelling, tenderness, numbness or tingling in her extremities at this time.  She admits that she needs to better hydrate and eat healthier   ECOG Performance Status: 1 - Symptomatic but completely ambulatory  Medications:  Allergies as of 04/12/2019      Reactions   Cephalexin Hives   Latex Itching, Swelling, Rash   Where touched.      Medication List       Accurate as of April 12, 2019  1:27 PM. If you have any questions, ask your nurse or doctor.        ALPRAZolam 1 MG tablet Commonly known as: XANAX Take 1 mg by mouth 3 (three) times daily as needed for anxiety.   Biotin 10000 MCG Tbdp Take 10,000 mcg by mouth daily.   divalproex 500 MG DR tablet Commonly known as: DEPAKOTE Take 2 tablets (1,000 mg total) by mouth 2 (two) times daily.   Fluocinolone Acetonide Scalp 0.01 % Oil Apply 1 application topically daily as needed.   fluticasone 50 MCG/ACT nasal spray Commonly known as: FLONASE Place 2 sprays daily into both nostrils. What changed:   when to take this  reasons to take this   ketoconazole 2 % shampoo Commonly known as: NIZORAL Apply 1 application topically 2 (two) times a week.   nitrofurantoin  (macrocrystal-monohydrate) 100 MG capsule Commonly known as: Macrobid Take 1 capsule (100 mg total) by mouth 2 (two) times daily.   polyethylene glycol powder 17 GM/SCOOP powder Commonly known as: MiraLax Take 17 g by mouth daily.   Pre-Natal Formula Tabs Take 1 tablet by mouth daily with breakfast.   vitamin C 500 MG tablet Commonly known as: ASCORBIC ACID Take 500 mg by mouth daily.       Allergies:  Allergies  Allergen Reactions  . Cephalexin Hives  . Latex Itching, Swelling and Rash    Where touched.    Past Medical History, Surgical history, Social history, and Family History were reviewed and updated.  Review of Systems: All other 10 point review of systems is negative.   Physical Exam:  vitals were not taken for this visit.   Wt Readings from Last 3 Encounters:  01/02/19 202 lb 8 oz (91.9 kg)  10/05/18 203 lb 8 oz (92.3 kg)  08/31/18 201 lb (91.2 kg)    Ocular: Sclerae unicteric, pupils equal, round and reactive to light Ear-nose-throat: Oropharynx clear, dentition fair Lymphatic: No cervical or supraclavicular adenopathy Lungs no rales or rhonchi, good excursion bilaterally Heart regular rate and rhythm, no murmur appreciated Abd soft, nontender, positive bowel sounds, no liver or spleen tip palpated on exam, no fluid wave  MSK no focal spinal tenderness, no joint edema Neuro: non-focal, well-oriented, appropriate affect Breasts: Deferred   Lab Results  Component Value Date   WBC 6.6 04/12/2019   HGB 9.5 (L) 04/12/2019   HCT 31.3 (L) 04/12/2019   MCV 60.5 (L) 04/12/2019   PLT 278 04/12/2019   Lab Results  Component Value Date   FERRITIN 925 (H) 01/02/2019   IRON 48 01/02/2019   TIBC 254 01/02/2019   UIBC 206 01/02/2019   IRONPCTSAT 19 (L) 01/02/2019   Lab Results  Component Value Date   RETICCTPCT 2.6 04/12/2019   RBC 5.24 (H) 04/12/2019   RETICCTABS 202.8 (H) 05/24/2014   No results found for: KPAFRELGTCHN, LAMBDASER, KAPLAMBRATIO No  results found for: IGGSERUM, IGA, IGMSERUM No results found for: Odetta Pink, SPEI   Chemistry      Component Value Date/Time   NA 141 01/02/2019 1333   NA 145 08/04/2017 0829   K 4.7 01/02/2019 1333   K 4.0 08/04/2017 0829   CL 109 01/02/2019 1333   CL 108 08/04/2017 0829   CO2 27 01/02/2019 1333   CO2 26 08/04/2017 0829   BUN 12 01/02/2019 1333   BUN 11 08/04/2017 0829   CREATININE 0.67 01/02/2019 1333   CREATININE 0.8 08/04/2017 0829      Component Value Date/Time   CALCIUM 8.6 (L) 01/02/2019 1333   CALCIUM 9.4 08/04/2017 0829   ALKPHOS 50 01/02/2019 1333   ALKPHOS 63 08/04/2017 0829   AST 10 (L) 01/02/2019 1333   ALT 7 01/02/2019 1333   ALT 14 08/04/2017 0829   BILITOT 0.4 01/02/2019 1333       Impression and Plan: Ms. 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  She is symptomatic with fatigue and lower back and tailbone pain.  We will get and xray of the lumbar spine today.  We will see what her iron studies show and bring her back in for infusion if needed.  We will go ahead and plan to see her back in another 3 months. She will contact our office with any questions or concerns. We can certainly see her sooner if needed.   Laverna Peace, NP 8/27/20201:27 PM

## 2019-04-13 ENCOUNTER — Telehealth: Payer: Self-pay | Admitting: *Deleted

## 2019-04-13 LAB — HEMOGLOBINOPATHY EVALUATION
Hgb A2 Quant: 4.8 % — ABNORMAL HIGH (ref 1.8–3.2)
Hgb A: 93.3 % — ABNORMAL LOW (ref 96.4–98.8)
Hgb C: 0 %
Hgb F Quant: 1.9 % (ref 0.0–2.0)
Hgb S Quant: 0 %
Hgb Variant: 0 %

## 2019-04-13 LAB — FERRITIN: Ferritin: 1436 ng/mL — ABNORMAL HIGH (ref 11–307)

## 2019-04-13 LAB — IRON AND TIBC
Iron: 58 ug/dL (ref 41–142)
Saturation Ratios: 23 % (ref 21–57)
TIBC: 254 ug/dL (ref 236–444)
UIBC: 196 ug/dL (ref 120–384)

## 2019-04-13 NOTE — Telephone Encounter (Signed)
-----   Message from [name], NP sent at 04/13/2019 10:38 AM EDT ----- Abigail Hall was negative! Margit Banda!  Abigail Hall ----- Message ----- From: Buel Ream, Rad Results In Sent: 04/12/2019  11:56 PM EDT To: [name], NP

## 2019-04-16 ENCOUNTER — Telehealth: Payer: Self-pay | Admitting: *Deleted

## 2019-04-16 NOTE — Telephone Encounter (Signed)
Call from patient returning call from Friday.  Pt notified per order of S. Dolores NP that xray is negative.  Patient appreciative of information and has no further questions or concerns at this time.

## 2019-04-28 ENCOUNTER — Other Ambulatory Visit: Payer: Self-pay | Admitting: Neurology

## 2019-07-16 ENCOUNTER — Inpatient Hospital Stay: Payer: BC Managed Care – PPO | Attending: Hematology & Oncology

## 2019-07-16 ENCOUNTER — Inpatient Hospital Stay: Payer: BC Managed Care – PPO | Admitting: Hematology & Oncology

## 2019-09-13 ENCOUNTER — Emergency Department (HOSPITAL_BASED_OUTPATIENT_CLINIC_OR_DEPARTMENT_OTHER)
Admission: EM | Admit: 2019-09-13 | Discharge: 2019-09-13 | Disposition: A | Discharge status: Home / Self Care | Payer: BC Managed Care – PPO | Attending: Emergency Medicine | Admitting: Emergency Medicine

## 2019-09-13 ENCOUNTER — Other Ambulatory Visit: Payer: Self-pay

## 2019-09-13 ENCOUNTER — Encounter (HOSPITAL_BASED_OUTPATIENT_CLINIC_OR_DEPARTMENT_OTHER): Payer: Self-pay

## 2019-09-13 DIAGNOSIS — M25551 Pain in right hip: Secondary | ICD-10-CM | POA: Diagnosis not present

## 2019-09-13 DIAGNOSIS — S46911A Strain of unspecified muscle, fascia and tendon at shoulder and upper arm level, right arm, initial encounter: Secondary | ICD-10-CM

## 2019-09-13 DIAGNOSIS — Z91040 Latex allergy status: Secondary | ICD-10-CM | POA: Insufficient documentation

## 2019-09-13 DIAGNOSIS — Y9389 Activity, other specified: Secondary | ICD-10-CM | POA: Insufficient documentation

## 2019-09-13 DIAGNOSIS — Y92410 Unspecified street and highway as the place of occurrence of the external cause: Secondary | ICD-10-CM | POA: Diagnosis not present

## 2019-09-13 DIAGNOSIS — Y999 Unspecified external cause status: Secondary | ICD-10-CM | POA: Diagnosis not present

## 2019-09-13 DIAGNOSIS — Z79899 Other long term (current) drug therapy: Secondary | ICD-10-CM | POA: Insufficient documentation

## 2019-09-13 DIAGNOSIS — S4991XA Unspecified injury of right shoulder and upper arm, initial encounter: Secondary | ICD-10-CM | POA: Diagnosis not present

## 2019-09-13 MED ORDER — CYCLOBENZAPRINE HCL 10 MG PO TABS: 10.0000 mg | ORAL_TABLET | Freq: Three times a day (TID) | ORAL | 0 refills | Status: DC | PRN
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
## 2019-09-13 NOTE — Discharge Instructions (Addendum)
Take ibuprofen 600 mg every 6 hours as needed for pain.  Take Flexeril as prescribed as needed for pain not relieved with ibuprofen.  Follow-up with primary doctor if symptoms or not improving in the next week.

## 2019-09-27 DIAGNOSIS — Z20828 Contact with and (suspected) exposure to other viral communicable diseases: Secondary | ICD-10-CM | POA: Diagnosis not present

## 2019-10-05 DIAGNOSIS — F410 Panic disorder [episodic paroxysmal anxiety] without agoraphobia: Secondary | ICD-10-CM | POA: Diagnosis not present

## 2019-10-05 DIAGNOSIS — F4322 Adjustment disorder with anxiety: Secondary | ICD-10-CM | POA: Diagnosis not present

## 2019-10-05 DIAGNOSIS — F3342 Major depressive disorder, recurrent, in full remission: Secondary | ICD-10-CM | POA: Diagnosis not present

## 2019-10-09 ENCOUNTER — Inpatient Hospital Stay: Payer: BC Managed Care – PPO

## 2019-10-09 ENCOUNTER — Other Ambulatory Visit: Payer: Self-pay

## 2019-10-09 ENCOUNTER — Encounter: Payer: Self-pay | Admitting: Family

## 2019-10-09 ENCOUNTER — Inpatient Hospital Stay: Payer: BC Managed Care – PPO | Attending: Family | Admitting: Family

## 2019-10-09 VITALS — BP 131/79 | HR 80 | Temp 97.7°F | Resp 18 | Ht 64.0 in | Wt 211.1 lb

## 2019-10-09 DIAGNOSIS — K909 Intestinal malabsorption, unspecified: Secondary | ICD-10-CM | POA: Diagnosis not present

## 2019-10-09 DIAGNOSIS — R200 Anesthesia of skin: Secondary | ICD-10-CM | POA: Insufficient documentation

## 2019-10-09 DIAGNOSIS — D508 Other iron deficiency anemias: Secondary | ICD-10-CM

## 2019-10-09 DIAGNOSIS — R202 Paresthesia of skin: Secondary | ICD-10-CM | POA: Diagnosis not present

## 2019-10-09 DIAGNOSIS — D509 Iron deficiency anemia, unspecified: Secondary | ICD-10-CM | POA: Diagnosis not present

## 2019-10-09 DIAGNOSIS — Z79899 Other long term (current) drug therapy: Secondary | ICD-10-CM | POA: Diagnosis not present

## 2019-10-09 DIAGNOSIS — Z9884 Bariatric surgery status: Secondary | ICD-10-CM | POA: Insufficient documentation

## 2019-10-09 DIAGNOSIS — D563 Thalassemia minor: Secondary | ICD-10-CM

## 2019-10-09 LAB — CMP (CANCER CENTER ONLY)
ALT: 8 U/L (ref 0–44)
AST: 9 U/L — ABNORMAL LOW (ref 15–41)
Albumin: 4.2 g/dL (ref 3.5–5.0)
Alkaline Phosphatase: 56 U/L (ref 38–126)
Anion gap: 6 (ref 5–15)
BUN: 17 mg/dL (ref 6–20)
CO2: 28 mmol/L (ref 22–32)
Calcium: 9.4 mg/dL (ref 8.9–10.3)
Chloride: 105 mmol/L (ref 98–111)
Creatinine: 0.65 mg/dL (ref 0.44–1.00)
GFR, Est AFR Am: >60 mL/min (ref >60)
GFR, Estimated: >60 mL/min (ref >60)
Glucose, Bld: 104 mg/dL — ABNORMAL HIGH (ref 70–99)
Potassium: 4.1 mmol/L (ref 3.5–5.1)
Sodium: 139 mmol/L (ref 135–145)
Total Bilirubin: 0.5 mg/dL (ref 0.3–1.2)
Total Protein: 6.8 g/dL (ref 6.5–8.1)

## 2019-10-09 LAB — CBC WITH DIFFERENTIAL (CANCER CENTER ONLY)
Abs Immature Granulocytes: 0.03 10*3/uL (ref 0.00–0.07)
Basophils Absolute: 0 10*3/uL (ref 0.0–0.1)
Basophils Relative: 1 %
Eosinophils Absolute: 0.1 10*3/uL (ref 0.0–0.5)
Eosinophils Relative: 2 %
HCT: 30 % — ABNORMAL LOW (ref 36.0–46.0)
Hemoglobin: 9 g/dL — ABNORMAL LOW (ref 12.0–15.0)
Immature Granulocytes: 0 %
Lymphocytes Relative: 39 %
Lymphs Abs: 2.8 10*3/uL (ref 0.7–4.0)
MCH: 18.3 pg — ABNORMAL LOW (ref 26.0–34.0)
MCHC: 30 g/dL (ref 30.0–36.0)
MCV: 60.9 fL — ABNORMAL LOW (ref 80.0–100.0)
Monocytes Absolute: 0.3 10*3/uL (ref 0.1–1.0)
Monocytes Relative: 5 %
Neutro Abs: 3.8 10*3/uL (ref 1.7–7.7)
Neutrophils Relative %: 53 %
Platelet Count: 300 10*3/uL (ref 150–400)
RBC: 4.93 MIL/uL (ref 3.87–5.11)
RDW: 17.2 % — ABNORMAL HIGH (ref 11.5–15.5)
WBC Count: 7.1 10*3/uL (ref 4.0–10.5)
nRBC: 0 % (ref 0.0–0.2)

## 2019-10-09 LAB — RETICULOCYTES
Immature Retic Fract: 13.2 % (ref 2.3–15.9)
RBC.: 4.96 MIL/uL (ref 3.87–5.11)
Retic Count, Absolute: 141.9 10*3/uL (ref 19.0–186.0)
Retic Ct Pct: 2.9 % (ref 0.4–3.1)

## 2019-10-09 NOTE — Progress Notes (Signed)
Hematology and Oncology Follow Up Visit  Abigail Hall [id number] 1980-09-11 39 y.o. 10/09/2019   Principle Diagnosis:  Thalassemia minor (beta thalassemia) History of iron deficiency anemia  Current Therapy:   Folic acid 2 mg by mouth daily- patient stopped herself IV iron as indicated   Interim History:  Abigail Hall is here today for follow-up. She is doing wlel and has no complaints at this time.  No episodes of bleeding. No bruising or petechiae.  No fever, chills, n/v, cough, rash, dizziness, SOB, chest pain, palpitations, abdominal pai or changes in bowel or bladder habits.  No swelling or tenderness in her extremities.  She will occasionally have numbness and tingling in her fingers and right arm. This seems to be positional.  No falls or syncopal episodes to report.  She has maintained a good appetite and is hydrating well. Her weight is stable.   ECOG Performance Status: 1 - Symptomatic but completely ambulatory  Medications:  Allergies as of 10/09/2019      Reactions   Cephalexin Hives   Latex Itching, Swelling, Rash      Medication List       Accurate as of October 09, 2019  3:18 PM. If you have any questions, ask your nurse or doctor.        ALPRAZolam 1 MG tablet Commonly known as: XANAX Take 1 mg by mouth 3 (three) times daily as needed for anxiety.   Biotin 10000 MCG Tbdp Take 10,000 mcg by mouth daily.   cyclobenzaprine 10 MG tablet Commonly known as: FLEXERIL Take 1 tablet (10 mg total) by mouth 3 (three) times daily as needed for muscle spasms.   divalproex 500 MG DR tablet Commonly known as: DEPAKOTE Take 2 tablets (1,000 mg total) by mouth 2 (two) times daily.   Fluocinolone Acetonide Scalp 0.01 % Oil APPLY EXTERNALLY TO THE AFFECTED AREA DAILY AS NEEDED   fluticasone 50 MCG/ACT nasal spray Commonly known as: FLONASE Place 2 sprays daily into both nostrils. What changed:   when to take this  reasons to take this     ketoconazole 2 % shampoo Commonly known as: NIZORAL APPLY EXTERNALLY 2 TIMES A WEEK   nitrofurantoin (macrocrystal-monohydrate) 100 MG capsule Commonly known as: Macrobid Take 1 capsule (100 mg total) by mouth 2 (two) times daily.   polyethylene glycol powder 17 GM/SCOOP powder Commonly known as: MiraLax Take 17 g by mouth daily.   Pre-Natal Formula Tabs Take 1 tablet by mouth daily with breakfast.   vitamin C 500 MG tablet Commonly known as: ASCORBIC ACID Take 500 mg by mouth daily.       Allergies:  Allergies  Allergen Reactions  . Cephalexin Hives  . Latex Itching, Swelling and Rash    Past Medical History, Surgical history, Social history, and Family History were reviewed and updated.  Review of Systems: All other 10 point review of systems is negative.   Physical Exam:  vitals were not taken for this visit.   Wt Readings from Last 3 Encounters:  09/13/19 198 lb (89.8 kg)  04/12/19 200 lb 14.4 oz (91.1 kg)  01/02/19 202 lb 8 oz (91.9 kg)    Ocular: Sclerae unicteric, pupils equal, round and reactive to light Ear-nose-throat: Oropharynx clear, dentition fair Lymphatic: No cervical or supraclavicular adenopathy Lungs no rales or rhonchi, good excursion bilaterally Heart regular rate and rhythm, no murmur appreciated Abd soft, nontender, positive bowel sounds, no liver or spleen tip palpated on exam, no fluid wave  MSK no focal  spinal tenderness, no joint edema Neuro: non-focal, well-oriented, appropriate affect Breasts: Deferred   Lab Results  Component Value Date   WBC 7.1 10/09/2019   HGB 9.0 (L) 10/09/2019   HCT 30.0 (L) 10/09/2019   MCV 60.9 (L) 10/09/2019   PLT 300 10/09/2019   Lab Results  Component Value Date   FERRITIN 1,436 (H) 04/12/2019   IRON 58 04/12/2019   TIBC 254 04/12/2019   UIBC 196 04/12/2019   IRONPCTSAT 23 04/12/2019   Lab Results  Component Value Date   RETICCTPCT 2.9 10/09/2019   RBC 4.96 10/09/2019   RETICCTABS 202.8  (H) 05/24/2014   No results found for: KPAFRELGTCHN, LAMBDASER, KAPLAMBRATIO No results found for: IGGSERUM, IGA, IGMSERUM No results found for: Odetta Pink, SPEI   Chemistry      Component Value Date/Time   NA 140 04/12/2019 1310   NA 145 08/04/2017 0829   K 4.4 04/12/2019 1310   K 4.0 08/04/2017 0829   CL 108 04/12/2019 1310   CL 108 08/04/2017 0829   CO2 25 04/12/2019 1310   CO2 26 08/04/2017 0829   BUN 13 04/12/2019 1310   BUN 11 08/04/2017 0829   CREATININE 0.70 04/12/2019 1310   CREATININE 0.8 08/04/2017 0829      Component Value Date/Time   CALCIUM 8.7 (L) 04/12/2019 1310   CALCIUM 9.4 08/04/2017 0829   ALKPHOS 63 04/12/2019 1310   ALKPHOS 63 08/04/2017 0829   AST 10 (L) 04/12/2019 1310   ALT 8 04/12/2019 1310   ALT 14 08/04/2017 0829   BILITOT 0.6 04/12/2019 1310       Impression and Plan: Abigail Hall is a very pleasant39 yo [name] withbeta thalassemia and 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 We will see what her iron studies look like and replace if needed.  She will return in 3 months.  She can contact our office with any questions or concerns. We can certainly see her sooner if needed.   Laverna Peace, NP 2/23/20213:18 PM

## 2019-10-10 DIAGNOSIS — H00024 Hordeolum internum left upper eyelid: Secondary | ICD-10-CM | POA: Diagnosis not present

## 2019-10-10 LAB — IRON AND TIBC
Iron: 40 ug/dL — ABNORMAL LOW (ref 41–142)
Saturation Ratios: 15 % — ABNORMAL LOW (ref 21–57)
TIBC: 269 ug/dL (ref 236–444)
UIBC: 229 ug/dL (ref 120–384)

## 2019-10-10 LAB — FERRITIN: Ferritin: 1670 ng/mL — ABNORMAL HIGH (ref 11–307)

## 2019-10-11 ENCOUNTER — Other Ambulatory Visit: Payer: Self-pay

## 2019-10-11 ENCOUNTER — Inpatient Hospital Stay: Payer: BC Managed Care – PPO

## 2019-10-11 VITALS — BP 111/68 | HR 70 | Temp 98.0°F | Resp 18

## 2019-10-11 DIAGNOSIS — K909 Intestinal malabsorption, unspecified: Secondary | ICD-10-CM | POA: Diagnosis not present

## 2019-10-11 DIAGNOSIS — D563 Thalassemia minor: Secondary | ICD-10-CM | POA: Diagnosis not present

## 2019-10-11 DIAGNOSIS — Z9884 Bariatric surgery status: Secondary | ICD-10-CM | POA: Diagnosis not present

## 2019-10-11 DIAGNOSIS — D509 Iron deficiency anemia, unspecified: Secondary | ICD-10-CM | POA: Diagnosis not present

## 2019-10-11 DIAGNOSIS — R200 Anesthesia of skin: Secondary | ICD-10-CM | POA: Diagnosis not present

## 2019-10-11 DIAGNOSIS — D500 Iron deficiency anemia secondary to blood loss (chronic): Secondary | ICD-10-CM

## 2019-10-11 DIAGNOSIS — Z79899 Other long term (current) drug therapy: Secondary | ICD-10-CM | POA: Diagnosis not present

## 2019-10-11 DIAGNOSIS — R202 Paresthesia of skin: Secondary | ICD-10-CM | POA: Diagnosis not present

## 2019-10-11 MED ORDER — SODIUM CHLORIDE 0.9 % IV SOLN
Freq: Once | INTRAVENOUS | Status: AC
  Filled 2019-10-11: qty 250

## 2019-10-11 MED ORDER — SODIUM CHLORIDE 0.9 % IV SOLN
510.0000 mg | Freq: Once | INTRAVENOUS | Status: AC
  Administered 2019-10-11: 510 mg via INTRAVENOUS
  Filled 2019-10-11: qty 510

## 2019-10-11 NOTE — Patient Instructions (Signed)

## 2019-10-29 DIAGNOSIS — H0014 Chalazion left upper eyelid: Secondary | ICD-10-CM | POA: Diagnosis not present

## 2020-01-08 ENCOUNTER — Telehealth: Payer: Self-pay | Admitting: Hematology & Oncology

## 2020-01-08 ENCOUNTER — Other Ambulatory Visit: Payer: Self-pay

## 2020-01-08 ENCOUNTER — Inpatient Hospital Stay: Payer: BC Managed Care – PPO

## 2020-01-08 ENCOUNTER — Encounter: Payer: Self-pay | Admitting: Hematology & Oncology

## 2020-01-08 ENCOUNTER — Inpatient Hospital Stay: Payer: BC Managed Care – PPO | Attending: Family | Admitting: Hematology & Oncology

## 2020-01-08 VITALS — BP 118/70 | HR 67 | Temp 97.1°F | Resp 18 | Wt 203.0 lb

## 2020-01-08 DIAGNOSIS — Z79899 Other long term (current) drug therapy: Secondary | ICD-10-CM | POA: Insufficient documentation

## 2020-01-08 DIAGNOSIS — D500 Iron deficiency anemia secondary to blood loss (chronic): Secondary | ICD-10-CM | POA: Diagnosis not present

## 2020-01-08 DIAGNOSIS — D563 Thalassemia minor: Secondary | ICD-10-CM | POA: Diagnosis not present

## 2020-01-08 DIAGNOSIS — D509 Iron deficiency anemia, unspecified: Secondary | ICD-10-CM | POA: Diagnosis not present

## 2020-01-08 DIAGNOSIS — D508 Other iron deficiency anemias: Secondary | ICD-10-CM

## 2020-01-08 LAB — CBC WITH DIFFERENTIAL (CANCER CENTER ONLY)
Abs Immature Granulocytes: 0.09 10*3/uL — ABNORMAL HIGH (ref 0.00–0.07)
Basophils Absolute: 0 10*3/uL (ref 0.0–0.1)
Basophils Relative: 1 %
Eosinophils Absolute: 0.1 10*3/uL (ref 0.0–0.5)
Eosinophils Relative: 2 %
HCT: 30.7 % — ABNORMAL LOW (ref 36.0–46.0)
Hemoglobin: 9.3 g/dL — ABNORMAL LOW (ref 12.0–15.0)
Immature Granulocytes: 1 %
Lymphocytes Relative: 46 %
Lymphs Abs: 3.1 10*3/uL (ref 0.7–4.0)
MCH: 18.2 pg — ABNORMAL LOW (ref 26.0–34.0)
MCHC: 30.3 g/dL (ref 30.0–36.0)
MCV: 60.2 fL — ABNORMAL LOW (ref 80.0–100.0)
Monocytes Absolute: 0.4 10*3/uL (ref 0.1–1.0)
Monocytes Relative: 6 %
Neutro Abs: 2.9 10*3/uL (ref 1.7–7.7)
Neutrophils Relative %: 44 %
Platelet Count: 263 10*3/uL (ref 150–400)
RBC: 5.1 MIL/uL (ref 3.87–5.11)
RDW: 16.5 % — ABNORMAL HIGH (ref 11.5–15.5)
WBC Count: 6.7 10*3/uL (ref 4.0–10.5)
nRBC: 0.9 % — ABNORMAL HIGH (ref 0.0–0.2)

## 2020-01-08 LAB — CMP (CANCER CENTER ONLY)
ALT: 9 U/L (ref 0–44)
AST: 10 U/L — ABNORMAL LOW (ref 15–41)
Albumin: 4 g/dL (ref 3.5–5.0)
Alkaline Phosphatase: 50 U/L (ref 38–126)
Anion gap: 6 (ref 5–15)
BUN: 12 mg/dL (ref 6–20)
CO2: 28 mmol/L (ref 22–32)
Calcium: 9.3 mg/dL (ref 8.9–10.3)
Chloride: 106 mmol/L (ref 98–111)
Creatinine: 0.66 mg/dL (ref 0.44–1.00)
GFR, Est AFR Am: >60 mL/min (ref >60)
GFR, Estimated: >60 mL/min (ref >60)
Glucose, Bld: 100 mg/dL — ABNORMAL HIGH (ref 70–99)
Potassium: 4.4 mmol/L (ref 3.5–5.1)
Sodium: 140 mmol/L (ref 135–145)
Total Bilirubin: 0.8 mg/dL (ref 0.3–1.2)
Total Protein: 6.6 g/dL (ref 6.5–8.1)

## 2020-01-08 LAB — IRON AND TIBC
Iron: 97 ug/dL (ref 41–142)
Saturation Ratios: 40 % (ref 21–57)
TIBC: 243 ug/dL (ref 236–444)
UIBC: 146 ug/dL (ref 120–384)

## 2020-01-08 LAB — RETICULOCYTES
Immature Retic Fract: 24.2 % — ABNORMAL HIGH (ref 2.3–15.9)
RBC.: 5.13 MIL/uL — ABNORMAL HIGH (ref 3.87–5.11)
Retic Count, Absolute: 128.3 10*3/uL (ref 19.0–186.0)
Retic Ct Pct: 2.5 % (ref 0.4–3.1)

## 2020-01-08 LAB — FERRITIN: Ferritin: 1560 ng/mL — ABNORMAL HIGH (ref 11–307)

## 2020-01-08 NOTE — Progress Notes (Signed)
Hematology and Oncology Follow Up Visit  Abigail Hall [id number] 1980-09-17 39 y.o. 01/08/2020   Principle Diagnosis:  Thalassemia minor (beta thalassemia) History of iron deficiency anemia  Current Therapy:   Folic acid 2 mg by mouth daily- patient stopped herself IV iron as indicated - feraheme given on 10/15/2019   Interim History:  Abigail Hall is here today for follow-up.  She is actually doing pretty well.  We did give her iron back in early March.  Her ferritin was 1600 which is more an acute phase reactant.  Iron saturation was only 15%.  She is under a little bit of stress right now.  Her mother had back surgery and had complications from this.  Her sister is in the hospital with a crisis.  She has had no problems with crisis for self.  She has had no fever.  There is been no cough or shortness of breath.  She has had no obvious change in bowel or bladder habits.  Overall, her performance status is ECOG 1.  Medications:  Allergies as of 01/08/2020      Reactions   Cephalexin Hives   Latex Itching, Swelling, Rash      Medication List       Accurate as of Jan 08, 2020  8:11 AM. If you have any questions, ask your nurse or doctor.        ALPRAZolam 1 MG tablet Commonly known as: XANAX Take 1 mg by mouth 3 (three) times daily as needed for anxiety.   Biotin 10000 MCG Tbdp Take 10,000 mcg by mouth daily.   cyclobenzaprine 10 MG tablet Commonly known as: FLEXERIL Take 1 tablet (10 mg total) by mouth 3 (three) times daily as needed for muscle spasms.   divalproex 500 MG DR tablet Commonly known as: DEPAKOTE Take 2 tablets (1,000 mg total) by mouth 2 (two) times daily.   Fluocinolone Acetonide Scalp 0.01 % Oil APPLY EXTERNALLY TO THE AFFECTED AREA DAILY AS NEEDED   fluticasone 50 MCG/ACT nasal spray Commonly known as: FLONASE Place 2 sprays daily into both nostrils. What changed:   when to take this  reasons to take this   ketoconazole 2 %  shampoo Commonly known as: NIZORAL APPLY EXTERNALLY 2 TIMES A WEEK   nitrofurantoin (macrocrystal-monohydrate) 100 MG capsule Commonly known as: Macrobid Take 1 capsule (100 mg total) by mouth 2 (two) times daily.   polyethylene glycol powder 17 GM/SCOOP powder Commonly known as: MiraLax Take 17 g by mouth daily.   Pre-Natal Formula Tabs Take 1 tablet by mouth daily with breakfast.   vitamin C 500 MG tablet Commonly known as: ASCORBIC ACID Take 500 mg by mouth daily.       Allergies:  Allergies  Allergen Reactions  . Cephalexin Hives  . Latex Itching, Swelling and Rash    Past Medical History, Surgical history, Social history, and Family History were reviewed and updated.  Review of Systems: Review of Systems  Constitutional: Negative.   HENT: Negative.   Eyes: Negative.   Respiratory: Negative.   Cardiovascular: Negative.   Gastrointestinal: Negative.   Genitourinary: Negative.   Musculoskeletal: Negative.   Skin: Negative.   Neurological: Negative.   Endo/Heme/Allergies: Negative.   Psychiatric/Behavioral: Negative.      Physical Exam:  weight is 203 lb (92.1 kg). Her temporal temperature is 97.1 F (36.2 C) (abnormal). Her blood pressure is 118/70 and her pulse is 67. Her respiration is 18 and oxygen saturation is 100%.   Wt Readings from  Last 3 Encounters:  01/08/20 203 lb (92.1 kg)  10/09/19 211 lb 1.3 oz (95.7 kg)  09/13/19 198 lb (89.8 kg)    Physical Exam Vitals reviewed.  HENT:     Head: Normocephalic and atraumatic.  Eyes:     Pupils: Pupils are equal, round, and reactive to light.  Cardiovascular:     Rate and Rhythm: Normal rate and regular rhythm.     Heart sounds: Normal heart sounds.  Pulmonary:     Effort: Pulmonary effort is normal.     Breath sounds: Normal breath sounds.  Abdominal:     General: Bowel sounds are normal.     Palpations: Abdomen is soft.  Musculoskeletal:        General: No tenderness or deformity. Normal  range of motion.     Cervical back: Normal range of motion.  Lymphadenopathy:     Cervical: No cervical adenopathy.  Skin:    General: Skin is warm and dry.     Findings: No erythema or rash.  Neurological:     Mental Status: She is alert and oriented to person, place, and time.  Psychiatric:        Behavior: Behavior normal.        Thought Content: Thought content normal.        Judgment: Judgment normal.      Lab Results  Component Value Date   WBC 6.7 01/08/2020   HGB 9.3 (L) 01/08/2020   HCT 30.7 (L) 01/08/2020   MCV 60.2 (L) 01/08/2020   PLT 263 01/08/2020   Lab Results  Component Value Date   FERRITIN 1,670 (H) 10/09/2019   IRON 40 (L) 10/09/2019   TIBC 269 10/09/2019   UIBC 229 10/09/2019   IRONPCTSAT 15 (L) 10/09/2019   Lab Results  Component Value Date   RETICCTPCT 2.5 01/08/2020   RBC 5.10 01/08/2020   RBC 5.13 (H) 01/08/2020   RETICCTABS 202.8 (H) 05/24/2014   No results found for: KPAFRELGTCHN, LAMBDASER, KAPLAMBRATIO No results found for: IGGSERUM, IGA, IGMSERUM No results found for: Odetta Pink, SPEI   Chemistry      Component Value Date/Time   NA 139 10/09/2019 1502   NA 145 08/04/2017 0829   K 4.1 10/09/2019 1502   K 4.0 08/04/2017 0829   CL 105 10/09/2019 1502   CL 108 08/04/2017 0829   CO2 28 10/09/2019 1502   CO2 26 08/04/2017 0829   BUN 17 10/09/2019 1502   BUN 11 08/04/2017 0829   CREATININE 0.65 10/09/2019 1502   CREATININE 0.8 08/04/2017 0829      Component Value Date/Time   CALCIUM 9.4 10/09/2019 1502   CALCIUM 9.4 08/04/2017 0829   ALKPHOS 56 10/09/2019 1502   ALKPHOS 63 08/04/2017 0829   AST 9 (L) 10/09/2019 1502   ALT 8 10/09/2019 1502   ALT 14 08/04/2017 0829   BILITOT 0.5 10/09/2019 1502       Impression and Plan: Abigail Hall is a very pleasant39 yo [name] withbeta thalassemia and iron deficiency anemia secondary to malabsorption after  gastric sleeve surgery.  1 thing I thought about with her is maybe trying her Luspatercept.  She does have beta thalassemia.  She is anemic.  Thankfully we have not had to transfuse her.  I think Luspatercept would not be a bad idea.  Right now, we will plan to get her back in about 5 weeks.  We will see what her iron levels are.  We may consider Luspatercept at that time.   [name], MD 5/25/20218:11 AM

## 2020-01-08 NOTE — Telephone Encounter (Signed)
Appointments scheduled calendar printed per 5/25 los 

## 2020-01-09 ENCOUNTER — Telehealth: Payer: Self-pay | Admitting: *Deleted

## 2020-01-09 DIAGNOSIS — Z6835 Body mass index (BMI) 35.0-35.9, adult: Secondary | ICD-10-CM | POA: Diagnosis not present

## 2020-01-09 DIAGNOSIS — Z01419 Encounter for gynecological examination (general) (routine) without abnormal findings: Secondary | ICD-10-CM | POA: Diagnosis not present

## 2020-01-09 DIAGNOSIS — Z1231 Encounter for screening mammogram for malignant neoplasm of breast: Secondary | ICD-10-CM | POA: Diagnosis not present

## 2020-01-09 NOTE — Telephone Encounter (Signed)
-----   Message from [name], MD sent at 01/08/2020  4:48 PM EDT ----- Call - the iron is great!!  Laurey Arrow

## 2020-01-09 NOTE — Telephone Encounter (Signed)
As noted below by Dr. Ennever, I informed the patient that the iron level is great. She verbalized understanding. 

## 2020-02-04 DIAGNOSIS — F411 Generalized anxiety disorder: Secondary | ICD-10-CM | POA: Diagnosis not present

## 2020-02-04 DIAGNOSIS — F4321 Adjustment disorder with depressed mood: Secondary | ICD-10-CM | POA: Diagnosis not present

## 2020-02-11 ENCOUNTER — Inpatient Hospital Stay: Payer: BC Managed Care – PPO | Admitting: Hematology & Oncology

## 2020-02-11 ENCOUNTER — Inpatient Hospital Stay: Payer: BC Managed Care – PPO

## 2020-03-13 ENCOUNTER — Inpatient Hospital Stay: Payer: BC Managed Care – PPO | Attending: Family | Admitting: Hematology & Oncology

## 2020-03-13 ENCOUNTER — Inpatient Hospital Stay: Payer: BC Managed Care – PPO

## 2020-03-13 ENCOUNTER — Other Ambulatory Visit: Payer: Self-pay

## 2020-03-13 VITALS — BP 113/68 | HR 70 | Temp 98.7°F | Resp 70 | Wt 204.0 lb

## 2020-03-13 DIAGNOSIS — D509 Iron deficiency anemia, unspecified: Secondary | ICD-10-CM | POA: Diagnosis not present

## 2020-03-13 DIAGNOSIS — D563 Thalassemia minor: Secondary | ICD-10-CM

## 2020-03-13 DIAGNOSIS — D500 Iron deficiency anemia secondary to blood loss (chronic): Secondary | ICD-10-CM

## 2020-03-13 DIAGNOSIS — Z9884 Bariatric surgery status: Secondary | ICD-10-CM | POA: Diagnosis not present

## 2020-03-13 DIAGNOSIS — K909 Intestinal malabsorption, unspecified: Secondary | ICD-10-CM | POA: Insufficient documentation

## 2020-03-13 LAB — CBC WITH DIFFERENTIAL (CANCER CENTER ONLY)
Abs Immature Granulocytes: 0.02 10*3/uL (ref 0.00–0.07)
Basophils Absolute: 0 10*3/uL (ref 0.0–0.1)
Basophils Relative: 1 %
Eosinophils Absolute: 0.1 10*3/uL (ref 0.0–0.5)
Eosinophils Relative: 2 %
HCT: 34.2 % — ABNORMAL LOW (ref 36.0–46.0)
Hemoglobin: 10.2 g/dL — ABNORMAL LOW (ref 12.0–15.0)
Immature Granulocytes: 0 %
Lymphocytes Relative: 45 %
Lymphs Abs: 2.9 10*3/uL (ref 0.7–4.0)
MCH: 18.1 pg — ABNORMAL LOW (ref 26.0–34.0)
MCHC: 29.8 g/dL — ABNORMAL LOW (ref 30.0–36.0)
MCV: 60.9 fL — ABNORMAL LOW (ref 80.0–100.0)
Monocytes Absolute: 0.3 10*3/uL (ref 0.1–1.0)
Monocytes Relative: 4 %
Neutro Abs: 3.1 10*3/uL (ref 1.7–7.7)
Neutrophils Relative %: 48 %
Platelet Count: 279 10*3/uL (ref 150–400)
RBC: 5.62 MIL/uL — ABNORMAL HIGH (ref 3.87–5.11)
RDW: 16.2 % — ABNORMAL HIGH (ref 11.5–15.5)
WBC Count: 6.4 10*3/uL (ref 4.0–10.5)
nRBC: 0 % (ref 0.0–0.2)

## 2020-03-13 LAB — CMP (CANCER CENTER ONLY)
ALT: 8 U/L (ref 0–44)
AST: 10 U/L — ABNORMAL LOW (ref 15–41)
Albumin: 4.5 g/dL (ref 3.5–5.0)
Alkaline Phosphatase: 58 U/L (ref 38–126)
Anion gap: 8 (ref 5–15)
BUN: 12 mg/dL (ref 6–20)
CO2: 26 mmol/L (ref 22–32)
Calcium: 10 mg/dL (ref 8.9–10.3)
Chloride: 107 mmol/L (ref 98–111)
Creatinine: 0.73 mg/dL (ref 0.44–1.00)
GFR, Est AFR Am: >60 mL/min (ref >60)
GFR, Estimated: >60 mL/min (ref >60)
Glucose, Bld: 114 mg/dL — ABNORMAL HIGH (ref 70–99)
Potassium: 4.5 mmol/L (ref 3.5–5.1)
Sodium: 141 mmol/L (ref 135–145)
Total Bilirubin: 0.6 mg/dL (ref 0.3–1.2)
Total Protein: 7.2 g/dL (ref 6.5–8.1)

## 2020-03-13 LAB — RETICULOCYTES
Immature Retic Fract: 18.4 % — ABNORMAL HIGH (ref 2.3–15.9)
RBC.: 5.61 MIL/uL — ABNORMAL HIGH (ref 3.87–5.11)
Retic Count, Absolute: 152 10*3/uL (ref 19.0–186.0)
Retic Ct Pct: 2.7 % (ref 0.4–3.1)

## 2020-03-13 LAB — IRON AND TIBC
Iron: 50 ug/dL (ref 41–142)
Saturation Ratios: 18 % — ABNORMAL LOW (ref 21–57)
TIBC: 271 ug/dL (ref 236–444)
UIBC: 221 ug/dL (ref 120–384)

## 2020-03-13 LAB — FERRITIN: Ferritin: 1564 ng/mL — ABNORMAL HIGH (ref 11–307)

## 2020-03-13 NOTE — Progress Notes (Signed)
Hematology and Oncology Follow Up Visit  Abigail Hall [id number] 1980/08/28 39 y.o. 03/13/2020   Principle Diagnosis:  Thalassemia minor (beta thalassemia) History of iron deficiency anemia  Current Therapy:   Folic acid 2 mg by mouth daily- patient stopped herself IV iron as indicated - feraheme given on 10/15/2019   Interim History:  Abigail Hall is here today for follow-up.  She is actually doing pretty well.  So far, she is doing pretty well.  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  She is very educated in this.  Her family is doing well.  Her son is doing well.  There is no problems with pain.  She has had no issues with crises.  She has had no problems with nausea or vomiting.  She has had no cough or shortness of breath.  Today, her iron studies showed a ferritin of 1564 with an iron saturation of 18%.  Allow the ferritin elevation is secondary to inflammatory issues.  She has had no rashes.  There is been no leg swelling.  Overall, her performance status is ECOG 1.  Medications:  Allergies as of 03/13/2020      Reactions   Cephalexin Hives   Latex Itching, Swelling, Rash      Medication List       Accurate as of March 13, 2020  5:12 PM. If you have any questions, ask your nurse or doctor.        STOP taking these medications   cyclobenzaprine 10 MG tablet Commonly known as: FLEXERIL Stopped by: [name], MD   nitrofurantoin (macrocrystal-monohydrate) 100 MG capsule Commonly known as: Macrobid Stopped by: [name], MD   Pre-Natal Formula Tabs Stopped by: [name], MD     TAKE these medications   ALPRAZolam 1 MG tablet Commonly known as: XANAX Take 1 mg by mouth 3 (three) times daily as needed for anxiety.   Biotin 10000 MCG Tbdp Take 10,000 mcg by mouth daily.   divalproex 500 MG DR tablet Commonly known as: DEPAKOTE Take 2 tablets (1,000 mg total) by mouth 2  (two) times daily.   Fluocinolone Acetonide Scalp 0.01 % Oil APPLY EXTERNALLY TO THE AFFECTED AREA DAILY AS NEEDED   fluticasone 50 MCG/ACT nasal spray Commonly known as: FLONASE Place 2 sprays daily into both nostrils. What changed:   when to take this  reasons to take this   ketoconazole 2 % shampoo Commonly known as: NIZORAL APPLY EXTERNALLY 2 TIMES A WEEK   polyethylene glycol powder 17 GM/SCOOP powder Commonly known as: GLYCOLAX/MIRALAX Take 1 Container by mouth as needed. 1 scoop as needed What changed: Another medication with the same name was removed. Continue taking this medication, and follow the directions you see here. Changed by: [name], MD   vitamin C 500 MG tablet Commonly known as: ASCORBIC ACID Take 500 mg by mouth daily.       Allergies:  Allergies  Allergen Reactions  . Cephalexin Hives  . Latex Itching, Swelling and Rash    Past Medical History, Surgical history, Social history, and Family History were reviewed and updated.  Review of Systems: Review of Systems  Constitutional: Negative.   HENT: Negative.   Eyes: Negative.   Respiratory: Negative.   Cardiovascular: Negative.   Gastrointestinal: Negative.   Genitourinary: Negative.   Musculoskeletal: Negative.   Skin: Negative.   Neurological: Negative.   Endo/Heme/Allergies: Negative.   Psychiatric/Behavioral: Negative.  Physical Exam:  weight is 204 lb (92.5 kg) (abnormal). Her oral temperature is 98.7 F (37.1 C). Her blood pressure is 113/68 and her pulse is 70. Her respiration is 70 (abnormal) and oxygen saturation is 100%.   Wt Readings from Last 3 Encounters:  03/13/20 (!) 204 lb (92.5 kg)  01/08/20 203 lb (92.1 kg)  10/09/19 211 lb 1.3 oz (95.7 kg)    Physical Exam Vitals reviewed.  HENT:     Head: Normocephalic and atraumatic.  Eyes:     Pupils: Pupils are equal, round, and reactive to light.  Cardiovascular:     Rate and Rhythm: Normal rate and regular  rhythm.     Heart sounds: Normal heart sounds.  Pulmonary:     Effort: Pulmonary effort is normal.     Breath sounds: Normal breath sounds.  Abdominal:     General: Bowel sounds are normal.     Palpations: Abdomen is soft.  Musculoskeletal:        General: No tenderness or deformity. Normal range of motion.     Cervical back: Normal range of motion.  Lymphadenopathy:     Cervical: No cervical adenopathy.  Skin:    General: Skin is warm and dry.     Findings: No erythema or rash.  Neurological:     Mental Status: She is alert and oriented to person, place, and time.  Psychiatric:        Behavior: Behavior normal.        Thought Content: Thought content normal.        Judgment: Judgment normal.      Lab Results  Component Value Date   WBC 6.4 03/13/2020   HGB 10.2 (L) 03/13/2020   HCT 34.2 (L) 03/13/2020   MCV 60.9 (L) 03/13/2020   PLT 279 03/13/2020   Lab Results  Component Value Date   FERRITIN 1,564 (H) 03/13/2020   IRON 50 03/13/2020   TIBC 271 03/13/2020   UIBC 221 03/13/2020   IRONPCTSAT 18 (L) 03/13/2020   Lab Results  Component Value Date   RETICCTPCT 2.7 03/13/2020   RBC 5.61 (H) 03/13/2020   RETICCTABS 202.8 (H) 05/24/2014   No results found for: KPAFRELGTCHN, LAMBDASER, KAPLAMBRATIO No results found for: IGGSERUM, IGA, IGMSERUM No results found for: Odetta Pink, SPEI   Chemistry      Component Value Date/Time   NA 141 03/13/2020 1151   NA 145 08/04/2017 0829   K 4.5 03/13/2020 1151   K 4.0 08/04/2017 0829   CL 107 03/13/2020 1151   CL 108 08/04/2017 0829   CO2 26 03/13/2020 1151   CO2 26 08/04/2017 0829   BUN 12 03/13/2020 1151   BUN 11 08/04/2017 0829   CREATININE 0.73 03/13/2020 1151   CREATININE 0.8 08/04/2017 0829      Component Value Date/Time   CALCIUM 10.0 03/13/2020 1151   CALCIUM 9.4 08/04/2017 0829   ALKPHOS 58 03/13/2020 1151   ALKPHOS 63 08/04/2017 0829   AST 10 (L)  03/13/2020 1151   ALT 8 03/13/2020 1151   ALT 14 08/04/2017 0829   BILITOT 0.6 03/13/2020 1151       Impression and Plan: Abigail Hall is a very pleasant39 yo [name] withbeta thalassemia and 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  I thought about possibly trying her on Luspatercept.  She does have beta thalassemia.  She is anemic.  Thankfully we have not had to transfuse  her.  I think Luspatercept would not be a bad idea.  Right now, we will plan to get her back in about 6 weeks.  We will see what her iron levels are.  We may consider Luspatercept at that time.   [name], MD 7/29/20215:12 PM

## 2020-03-17 ENCOUNTER — Other Ambulatory Visit: Payer: Self-pay

## 2020-03-17 ENCOUNTER — Inpatient Hospital Stay: Payer: BC Managed Care – PPO | Attending: Family

## 2020-03-17 VITALS — BP 121/64 | HR 68 | Temp 98.3°F

## 2020-03-17 DIAGNOSIS — Z79899 Other long term (current) drug therapy: Secondary | ICD-10-CM | POA: Insufficient documentation

## 2020-03-17 DIAGNOSIS — D500 Iron deficiency anemia secondary to blood loss (chronic): Secondary | ICD-10-CM

## 2020-03-17 DIAGNOSIS — D563 Thalassemia minor: Secondary | ICD-10-CM | POA: Diagnosis not present

## 2020-03-17 LAB — HGB FRACTIONATION CASCADE
Hgb A2: 5 % — ABNORMAL HIGH (ref 1.8–3.2)
Hgb A: 93.1 % — ABNORMAL LOW (ref 96.4–98.8)
Hgb F: 1.9 % (ref 0.0–2.0)
Hgb S: 0 %

## 2020-03-17 MED ORDER — SODIUM CHLORIDE 0.9% FLUSH
10.0000 mL | Freq: Once | INTRAVENOUS | Status: DC | PRN
  Filled 2020-03-17: qty 10

## 2020-03-17 MED ORDER — SODIUM CHLORIDE 0.9% FLUSH
3.0000 mL | Freq: Once | INTRAVENOUS | Status: DC | PRN
  Filled 2020-03-17: qty 10

## 2020-03-17 MED ORDER — SODIUM CHLORIDE 0.9 % IV SOLN
510.0000 mg | Freq: Once | INTRAVENOUS | Status: AC
  Administered 2020-03-17: 510 mg via INTRAVENOUS
  Filled 2020-03-17: qty 510

## 2020-03-17 NOTE — Patient Instructions (Signed)

## 2020-04-25 ENCOUNTER — Telehealth: Payer: Self-pay | Admitting: Neurology

## 2020-04-25 DIAGNOSIS — F3342 Major depressive disorder, recurrent, in full remission: Secondary | ICD-10-CM | POA: Diagnosis not present

## 2020-04-25 DIAGNOSIS — F410 Panic disorder [episodic paroxysmal anxiety] without agoraphobia: Secondary | ICD-10-CM | POA: Diagnosis not present

## 2020-04-25 DIAGNOSIS — F4322 Adjustment disorder with anxiety: Secondary | ICD-10-CM | POA: Diagnosis not present

## 2020-04-25 NOTE — Telephone Encounter (Signed)
Pt called, had a seizure on Wednesday, September 8. Would like a call from the nurse.

## 2020-04-28 NOTE — Telephone Encounter (Signed)
I spoke with the pt. She tested positive for COVID Saturday. Her last seizure was over a year ago. She believes the 04/23/20 seizure may have been r/t this illness. Pt denies missing any doses. She takes Depakote DR 1000 mg BID. I scheduled the pt for a follow up video appointment via [facility] tomorrow w/ Judson Roch NP at 3:45 pm, check in at 3:35 pm. The pt verbalized appreciation. I also advised her not to drive for 6 months.

## 2020-04-28 NOTE — Telephone Encounter (Signed)
Sounds like this is Covid related, if you agree after appointment I do not think we need to restrict her driving for 6 months if she has recovered. thanks

## 2020-04-29 ENCOUNTER — Inpatient Hospital Stay: Payer: BC Managed Care – PPO | Admitting: Hematology & Oncology

## 2020-04-29 ENCOUNTER — Telehealth: Payer: Self-pay | Admitting: Neurology

## 2020-04-29 ENCOUNTER — Inpatient Hospital Stay: Payer: BC Managed Care – PPO

## 2020-04-29 NOTE — Telephone Encounter (Signed)
She did not log on for her VV appointment on 04/29/20 at 345. I called her, I left a message, asking her to call back to reschedule.

## 2020-04-29 NOTE — Progress Notes (Deleted)
Virtual Visit via Video Note  I connected with Abigail Hall on 04/29/20 at  3:45 PM EDT by a video enabled telemedicine application and verified that I am speaking with the correct person using two identifiers.  Location: Patient: at her home  Provider: In the office    I discussed the limitations of evaluation and management by telemedicine and the availability of in person appointments. The patient expressed understanding and agreed to proceed.  History of Present Illness: Interval history April 29, 2020 SS: For AEDs has tried and failed Lamictal, topiramate, Vimpat.  When last seen in January 2020, Depakote was causing significant weight gain, planning to switch to Keppra.  She did switch to Keppra for some time, but decided to back to Depakote.  Currently taking Depakote DR 1000 mg twice a day.  Interval history 08/31/2018: Patient is here for lumbar radiculopathy and seizure disorder.  She has not had a seizure.  She is well controlled on Depakote however she does not like the side effects.  In the past she is tried multiple medications she had side effects to topiramate, she was tried on Lamictal and Vimpat and several others.  She was on Keppra at one point and was doing well but after the birth of her child she had seizures and she was switched back to Depakote.  She like to consider Keppra again.  Discussed that changing medications include the risk of seizure breakthrough and she will have to be on 2 seizure medications for several months while we transition.  She does understand the risks and would like to try Keppra.  I discussed would like to try her on a high-dose Keppra XR at thousand milligrams twice daily.  I do like dosing Keppra XR twice daily even though it is a once daily extended release formulation. Lumbar radic improved.  Interval history:Patient stopped the topiramate and she feels much better. Lamictal and Keppra did not help her in the past. We'll continue  Depakote at this time. Her biggest concern is weight gain especially on the Depakote. Recommended a healthy weight and wellness Center. Discussed obesity. Discussed diet and exercise. She has scalp psoriasis, can give her some shampoo but recommend pcp or derm.  Interval History 11/09/2016: Patient returns for follow-up on seizure disorder. She has been on Depakote. At last appointment she wanted to switch agents due to weight gain side placed her on topiramate. She has been on Keppra in the past during pregnancy. Lamictal did not work for her in the past. 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 This could be the cause of her significant fatigue and weakness over the last month. Other causes can be medication side effects of topiramate or being on both topiramate and Depakote. Hair loss can be caused by topiramate. We can stop the Topiramate. She doesn't know if she is snoring. Just started over the last month. She has been missing work due to being so tired. Was hoping to switch Depakote for Topiramate but need to stop and see if this is causing side effects. Patient crying in the office today, requesting disability for this week for me, endorses multiple times that she is taking the topiramate. States states she last took topiramate last evening. Will check a Topiramate level.  Interval history 09/23/2016: She was evaluated by [facility] for right L5  radic and has a CT myelogram and three is no surgical intervention at this time there is no nerve root impingement. No Seizures. She is doing well on her Depakote and no seizures. Gabapentin did not help and the pain comes and goes. The last time she had a seizure was last march. She has been on the Depakote "forever". She was on Keppra for her pregnancy, she is not planning on having anymore kids and had a  hysterectomy. Depakote works for her but the weight gain is significant and she would like to try a different AED. Discussed risks including breakthrough seizures, discussed choices, Topamax is a good AED but often needs higher doses that can cause side effects. Lamictal does not work for her.   Abigail Hall:YQMVHQI Poindexteris a 39 y.o.femalehere as a referral from Dr. Karlton Lemon MD for right leg pain and radiculopathy. Ongoing since 2015 no inciting events or trauma. She has had ESI which initially helped a little but not working anymore. She is having difficulty with weight gain since she cannot work out especially on Depakote for her seizure disorder. She can't work out because of the right leg weakness and the pain. She has leg pain with radicular symptoms into the right leg with numbness of the foot. Foot numbness since August. The pain starts in the upper right leg and radiates down the side and back of the thigh and to the lateral lower leg and into the foot. Her leg gives out, weak. Most painful when in a position for a long time such as sitting in one position for a long time. At one point there was so much pain she couldn't even sit. Muscle relaxers don't help, hydrocodone help a little bit but not on it now and doesn't want to take it , hard to sleep because she wakes with pain. Her right foot is numb in the toes and in the lateral aspect of the foot. She can' t feel the bottom of her heel, continuously numb, she can;t drive for long periods. She gets cramps in the right foot. No changes in bowel or bladder.   Reviewed notes, labs and imaging from outside physicians, which showed:  BUN 8, creatinine 0.65 05/2014, TSH 1.552 10/2011  MRI lumbar spine 04/2016: personally reviewed images and agree with the following  Disc levels:  L1-2: Negative.  L2-3: Normal interspace. No disc bulge or disc protrusion. Left-sided facet arthrosis. No canal or foraminal stenosis.  L3-4:  Negative.  L4-5: Mild diffuse degenerative disc bulge with disc desiccation, stable. No focal disc herniation. Mild bilateral facet arthrosis, right worse than left. No canal stenosis. Mild bilateral foraminal narrowing, stable.  L5-S1: Negative. Aic 6 IMPRESSION: 1. Shallow broad-based disc protrusion at L4-5 with resultant mild bilateral foraminal narrowing, stable from prior. No other significant degenerative disc disease. No new focal disc herniation. 2. Mild facet arthrosis at L2-3 and L4-5 as above.   Observations/Objective:   Assessment and Plan:   Follow Up Instructions:    I discussed the assessment and treatment plan with the patient. The patient was provided an opportunity to ask questions and all were answered. The patient agreed with the plan and demonstrated an understanding of the instructions.   The patient was advised to call back or seek an in-person evaluation if the symptoms worsen or if the condition fails to improve as anticipated.  I provided *** minutes of non-face-to-face time during this encounter.   Suzzanne Cloud, NP

## 2020-05-19 ENCOUNTER — Inpatient Hospital Stay: Payer: BC Managed Care – PPO | Attending: Family

## 2020-05-19 ENCOUNTER — Inpatient Hospital Stay (HOSPITAL_BASED_OUTPATIENT_CLINIC_OR_DEPARTMENT_OTHER): Payer: BC Managed Care – PPO | Admitting: Hematology & Oncology

## 2020-05-19 ENCOUNTER — Encounter: Payer: Self-pay | Admitting: Hematology & Oncology

## 2020-05-19 ENCOUNTER — Other Ambulatory Visit: Payer: Self-pay

## 2020-05-19 VITALS — BP 119/73 | HR 71 | Temp 98.0°F | Resp 20 | Wt 197.8 lb

## 2020-05-19 DIAGNOSIS — D500 Iron deficiency anemia secondary to blood loss (chronic): Secondary | ICD-10-CM

## 2020-05-19 DIAGNOSIS — D563 Thalassemia minor: Secondary | ICD-10-CM | POA: Insufficient documentation

## 2020-05-19 DIAGNOSIS — D509 Iron deficiency anemia, unspecified: Secondary | ICD-10-CM | POA: Insufficient documentation

## 2020-05-19 DIAGNOSIS — Z79899 Other long term (current) drug therapy: Secondary | ICD-10-CM | POA: Insufficient documentation

## 2020-05-19 DIAGNOSIS — Z9884 Bariatric surgery status: Secondary | ICD-10-CM | POA: Insufficient documentation

## 2020-05-19 LAB — CBC WITH DIFFERENTIAL (CANCER CENTER ONLY)
Abs Immature Granulocytes: 0.07 10*3/uL (ref 0.00–0.07)
Basophils Absolute: 0.1 10*3/uL (ref 0.0–0.1)
Basophils Relative: 1 %
Eosinophils Absolute: 0.2 10*3/uL (ref 0.0–0.5)
Eosinophils Relative: 3 %
HCT: 30.3 % — ABNORMAL LOW (ref 36.0–46.0)
Hemoglobin: 9.1 g/dL — ABNORMAL LOW (ref 12.0–15.0)
Immature Granulocytes: 1 %
Lymphocytes Relative: 47 %
Lymphs Abs: 3 10*3/uL (ref 0.7–4.0)
MCH: 17.9 pg — ABNORMAL LOW (ref 26.0–34.0)
MCHC: 30 g/dL (ref 30.0–36.0)
MCV: 59.8 fL — ABNORMAL LOW (ref 80.0–100.0)
Monocytes Absolute: 0.4 10*3/uL (ref 0.1–1.0)
Monocytes Relative: 7 %
Neutro Abs: 2.6 10*3/uL (ref 1.7–7.7)
Neutrophils Relative %: 41 %
Platelet Count: 265 10*3/uL (ref 150–400)
RBC: 5.07 MIL/uL (ref 3.87–5.11)
RDW: 17.2 % — ABNORMAL HIGH (ref 11.5–15.5)
WBC Count: 6.4 10*3/uL (ref 4.0–10.5)
nRBC: 0.3 % — ABNORMAL HIGH (ref 0.0–0.2)

## 2020-05-19 LAB — RETICULOCYTES
Immature Retic Fract: 16.5 % — ABNORMAL HIGH (ref 2.3–15.9)
RBC.: 5.09 MIL/uL (ref 3.87–5.11)
Retic Count, Absolute: 143 10*3/uL (ref 19.0–186.0)
Retic Ct Pct: 2.8 % (ref 0.4–3.1)

## 2020-05-19 LAB — IRON AND TIBC
Iron: 89 ug/dL (ref 41–142)
Saturation Ratios: 37 % (ref 21–57)
TIBC: 239 ug/dL (ref 236–444)
UIBC: 150 ug/dL (ref 120–384)

## 2020-05-19 LAB — CMP (CANCER CENTER ONLY)
ALT: 8 U/L (ref 0–44)
AST: 9 U/L — ABNORMAL LOW (ref 15–41)
Albumin: 4.1 g/dL (ref 3.5–5.0)
Alkaline Phosphatase: 52 U/L (ref 38–126)
Anion gap: 4 — ABNORMAL LOW (ref 5–15)
BUN: 9 mg/dL (ref 6–20)
CO2: 28 mmol/L (ref 22–32)
Calcium: 9.8 mg/dL (ref 8.9–10.3)
Chloride: 106 mmol/L (ref 98–111)
Creatinine: 0.69 mg/dL (ref 0.44–1.00)
GFR, Est AFR Am: >60 mL/min (ref >60)
GFR, Estimated: >60 mL/min (ref >60)
Glucose, Bld: 107 mg/dL — ABNORMAL HIGH (ref 70–99)
Potassium: 4 mmol/L (ref 3.5–5.1)
Sodium: 138 mmol/L (ref 135–145)
Total Bilirubin: 0.6 mg/dL (ref 0.3–1.2)
Total Protein: 6.5 g/dL (ref 6.5–8.1)

## 2020-05-19 LAB — SAVE SMEAR(SSMR), FOR PROVIDER SLIDE REVIEW

## 2020-05-19 LAB — FERRITIN: Ferritin: 1961 ng/mL — ABNORMAL HIGH (ref 11–307)

## 2020-05-19 NOTE — Progress Notes (Signed)
Hematology and Oncology Follow Up Visit  Abigail Hall [id number] 15-Jun-1981 39 y.o. 05/19/2020   Principle Diagnosis:  Thalassemia minor (beta thalassemia) History of iron deficiency anemia  Current Therapy:   Folic acid 2 mg by mouth daily- patient stopped herself IV iron as indicated - feraheme given on 10/15/2019   Interim History:  Abigail Hall is here today for follow-up.  She is actually doing pretty well.  So far, she is doing pretty well.  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  She is very educated in this.  Abigail family is doing well.  Abigail Hall is doing well.  There is no problems with pain.  She has had no issues with crises.  She has had no problems with nausea or vomiting.  She has had no cough or shortness of breath.  Today, Abigail iron studies showed a ferritin of 1961 with an iron saturation of 37%.  I believe that the ferritin elevation is secondary to inflammatory issues.  She has had no rashes.  There is been no leg swelling.  Overall, Abigail performance status is ECOG 1.  Medications:  Allergies as of 05/19/2020      Reactions   Cephalexin Hives   Latex Itching, Swelling, Rash      Medication List       Accurate as of May 19, 2020  9:06 AM. If you have any questions, ask your nurse or doctor.        ALPRAZolam 1 MG tablet Commonly known as: XANAX Take 1 mg by mouth 3 (three) times daily as needed for anxiety.   Biotin 10000 MCG Tbdp Take 10,000 mcg by mouth daily.   divalproex 500 MG DR tablet Commonly known as: DEPAKOTE Take 2 tablets (1,000 mg total) by mouth 2 (two) times daily.   Fluocinolone Acetonide Scalp 0.01 % Oil APPLY EXTERNALLY TO THE AFFECTED AREA DAILY AS NEEDED   fluticasone 50 MCG/ACT nasal spray Commonly known as: FLONASE Place 2 sprays daily into both nostrils. What changed:   when to take this  reasons to take this   ketoconazole 2 % shampoo Commonly known  as: NIZORAL APPLY EXTERNALLY 2 TIMES A WEEK   polyethylene glycol powder 17 GM/SCOOP powder Commonly known as: GLYCOLAX/MIRALAX Take 1 Container by mouth as needed. 1 scoop as needed   vitamin C 500 MG tablet Commonly known as: ASCORBIC ACID Take 500 mg by mouth daily.       Allergies:  Allergies  Allergen Reactions  . Cephalexin Hives  . Latex Itching, Swelling and Rash    Past Medical History, Surgical history, Social history, and Family History were reviewed and updated.  Review of Systems: Review of Systems  Constitutional: Negative.   HENT: Negative.   Eyes: Negative.   Respiratory: Negative.   Cardiovascular: Negative.   Gastrointestinal: Negative.   Genitourinary: Negative.   Musculoskeletal: Negative.   Skin: Negative.   Neurological: Negative.   Endo/Heme/Allergies: Negative.   Psychiatric/Behavioral: Negative.      Physical Exam:  weight is 197 lb 12.8 oz (89.7 kg). Abigail oral temperature is 98 F (36.7 C). Abigail blood pressure is 119/73 and Abigail pulse is 71. Abigail respiration is 20 and oxygen saturation is 100%.   Wt Readings from Last 3 Encounters:  05/19/20 197 lb 12.8 oz (89.7 kg)  03/13/20 (!) 204 lb (92.5 kg)  01/08/20 203 lb (92.1 kg)    Physical Exam Vitals reviewed.  HENT:  Head: Normocephalic and atraumatic.  Eyes:     Pupils: Pupils are equal, round, and reactive to light.  Cardiovascular:     Rate and Rhythm: Normal rate and regular rhythm.     Heart sounds: Normal heart sounds.  Pulmonary:     Effort: Pulmonary effort is normal.     Breath sounds: Normal breath sounds.  Abdominal:     General: Bowel sounds are normal.     Palpations: Abdomen is soft.  Musculoskeletal:        General: No tenderness or deformity. Normal range of motion.     Cervical back: Normal range of motion.  Lymphadenopathy:     Cervical: No cervical adenopathy.  Skin:    General: Skin is warm and dry.     Findings: No erythema or rash.  Neurological:      Mental Status: She is alert and oriented to person, place, and time.  Psychiatric:        Behavior: Behavior normal.        Thought Content: Thought content normal.        Judgment: Judgment normal.      Lab Results  Component Value Date   WBC 6.4 05/19/2020   HGB 9.1 (L) 05/19/2020   HCT 30.3 (L) 05/19/2020   MCV 59.8 (L) 05/19/2020   PLT 265 05/19/2020   Lab Results  Component Value Date   FERRITIN 1,564 (H) 03/13/2020   IRON 50 03/13/2020   TIBC 271 03/13/2020   UIBC 221 03/13/2020   IRONPCTSAT 18 (L) 03/13/2020   Lab Results  Component Value Date   RETICCTPCT 2.8 05/19/2020   RBC 5.07 05/19/2020   RBC 5.09 05/19/2020   RETICCTABS 202.8 (H) 05/24/2014   No results found for: KPAFRELGTCHN, LAMBDASER, KAPLAMBRATIO No results found for: IGGSERUM, IGA, IGMSERUM No results found for: Odetta Pink, SPEI   Chemistry      Component Value Date/Time   NA 138 05/19/2020 0814   NA 145 08/04/2017 0829   K 4.0 05/19/2020 0814   K 4.0 08/04/2017 0829   CL 106 05/19/2020 0814   CL 108 08/04/2017 0829   CO2 28 05/19/2020 0814   CO2 26 08/04/2017 0829   BUN 9 05/19/2020 0814   BUN 11 08/04/2017 0829   CREATININE 0.69 05/19/2020 0814   CREATININE 0.8 08/04/2017 0829      Component Value Date/Time   CALCIUM 9.8 05/19/2020 0814   CALCIUM 9.4 08/04/2017 0829   ALKPHOS 52 05/19/2020 0814   ALKPHOS 63 08/04/2017 0829   AST 9 (L) 05/19/2020 0814   ALT 8 05/19/2020 0814   ALT 14 08/04/2017 0829   BILITOT 0.6 05/19/2020 0814       Impression and Plan: Abigail Hall is a very pleasant39 yo [name] withbeta thalassemia and 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  I thought about possibly trying Abigail on Luspatercept.  She does have beta thalassemia.  She is anemic.  Thankfully we have not had to transfuse Abigail.  I think Luspatercept would not be a bad idea.  Right  now, we will plan to get Abigail back in about 6 weeks.  We will see what Abigail iron levels are.  We may consider Luspatercept at that time.   Abigail Napoleon, MD 10/4/20219:06 AM

## 2020-06-04 ENCOUNTER — Encounter (HOSPITAL_BASED_OUTPATIENT_CLINIC_OR_DEPARTMENT_OTHER): Payer: Self-pay

## 2020-06-04 ENCOUNTER — Ambulatory Visit
Admission: EM | Admit: 2020-06-04 | Discharge: 2020-06-04 | Disposition: A | Discharge status: Other Acute Inpatient Hospital | Payer: BC Managed Care – PPO

## 2020-06-04 ENCOUNTER — Observation Stay (HOSPITAL_BASED_OUTPATIENT_CLINIC_OR_DEPARTMENT_OTHER)
Admission: EM | Admit: 2020-06-04 | Discharge: 2020-06-06 | Disposition: A | Discharge status: Home / Self Care | Payer: BC Managed Care – PPO | Attending: Physician Assistant | Admitting: Physician Assistant

## 2020-06-04 ENCOUNTER — Other Ambulatory Visit: Payer: Self-pay

## 2020-06-04 ENCOUNTER — Emergency Department (HOSPITAL_BASED_OUTPATIENT_CLINIC_OR_DEPARTMENT_OTHER): Payer: BC Managed Care – PPO

## 2020-06-04 DIAGNOSIS — Z20822 Contact with and (suspected) exposure to covid-19: Secondary | ICD-10-CM | POA: Insufficient documentation

## 2020-06-04 DIAGNOSIS — R1031 Right lower quadrant pain: Secondary | ICD-10-CM

## 2020-06-04 DIAGNOSIS — K3580 Unspecified acute appendicitis: Principal | ICD-10-CM | POA: Insufficient documentation

## 2020-06-04 DIAGNOSIS — Z91040 Latex allergy status: Secondary | ICD-10-CM | POA: Insufficient documentation

## 2020-06-04 DIAGNOSIS — K37 Unspecified appendicitis: Secondary | ICD-10-CM | POA: Diagnosis present

## 2020-06-04 DIAGNOSIS — R1084 Generalized abdominal pain: Secondary | ICD-10-CM

## 2020-06-04 HISTORY — DX: Unspecified appendicitis: K37

## 2020-06-04 LAB — COMPREHENSIVE METABOLIC PANEL
ALT: 9 U/L (ref 0–44)
AST: 11 U/L — ABNORMAL LOW (ref 15–41)
Albumin: 3.6 g/dL (ref 3.5–5.0)
Alkaline Phosphatase: 45 U/L (ref 38–126)
Anion gap: 11 (ref 5–15)
BUN: 15 mg/dL (ref 6–20)
CO2: 24 mmol/L (ref 22–32)
Calcium: 8.7 mg/dL — ABNORMAL LOW (ref 8.9–10.3)
Chloride: 104 mmol/L (ref 98–111)
Creatinine, Ser: 0.57 mg/dL (ref 0.44–1.00)
GFR, Estimated: >60 mL/min (ref >60)
Glucose, Bld: 90 mg/dL (ref 70–99)
Potassium: 4.1 mmol/L (ref 3.5–5.1)
Sodium: 139 mmol/L (ref 135–145)
Total Bilirubin: 0.6 mg/dL (ref 0.3–1.2)
Total Protein: 6.5 g/dL (ref 6.5–8.1)

## 2020-06-04 LAB — URINALYSIS, ROUTINE W REFLEX MICROSCOPIC
Bilirubin Urine: NEGATIVE
Glucose, UA: NEGATIVE mg/dL
Hgb urine dipstick: NEGATIVE
Ketones, ur: 15 mg/dL — AB
Leukocytes,Ua: NEGATIVE
Nitrite: NEGATIVE
Protein, ur: NEGATIVE mg/dL
Specific Gravity, Urine: 1.02 (ref 1.005–1.030)
pH: 7.5 (ref 5.0–8.0)

## 2020-06-04 LAB — CBC WITH DIFFERENTIAL/PLATELET
Abs Immature Granulocytes: 0.04 10*3/uL (ref 0.00–0.07)
Basophils Absolute: 0 10*3/uL (ref 0.0–0.1)
Basophils Relative: 1 %
Eosinophils Absolute: 0.2 10*3/uL (ref 0.0–0.5)
Eosinophils Relative: 2 %
HCT: 28.2 % — ABNORMAL LOW (ref 36.0–46.0)
Hemoglobin: 8.6 g/dL — ABNORMAL LOW (ref 12.0–15.0)
Immature Granulocytes: 1 %
Lymphocytes Relative: 37 %
Lymphs Abs: 2.6 10*3/uL (ref 0.7–4.0)
MCH: 18.4 pg — ABNORMAL LOW (ref 26.0–34.0)
MCHC: 30.5 g/dL (ref 30.0–36.0)
MCV: 60.4 fL — ABNORMAL LOW (ref 80.0–100.0)
Monocytes Absolute: 0.4 10*3/uL (ref 0.1–1.0)
Monocytes Relative: 6 %
Neutro Abs: 3.7 10*3/uL (ref 1.7–7.7)
Neutrophils Relative %: 53 %
Platelets: 259 10*3/uL (ref 150–400)
RBC: 4.67 MIL/uL (ref 3.87–5.11)
RDW: 18 % — ABNORMAL HIGH (ref 11.5–15.5)
WBC: 6.9 10*3/uL (ref 4.0–10.5)
nRBC: 0.4 % — ABNORMAL HIGH (ref 0.0–0.2)

## 2020-06-04 LAB — OCCULT BLOOD X 1 CARD TO LAB, STOOL: Fecal Occult Bld: NEGATIVE

## 2020-06-04 LAB — WET PREP, GENITAL
Sperm: NONE SEEN
Trich, Wet Prep: NONE SEEN
Yeast Wet Prep HPF POC: NONE SEEN

## 2020-06-04 LAB — RESPIRATORY PANEL BY RT PCR (FLU A&B, COVID)
Influenza A by PCR: NEGATIVE
Influenza B by PCR: NEGATIVE
SARS Coronavirus 2 by RT PCR: NEGATIVE

## 2020-06-04 LAB — PREGNANCY, URINE: Preg Test, Ur: NEGATIVE

## 2020-06-04 LAB — LIPASE, BLOOD: Lipase: 48 U/L (ref 11–51)

## 2020-06-04 MED ORDER — FENTANYL CITRATE (PF) 100 MCG/2ML IJ SOLN
100.0000 ug | INTRAMUSCULAR | Status: AC
  Administered 2020-06-04: 100 ug via INTRAVENOUS
  Filled 2020-06-04: qty 2

## 2020-06-04 MED ORDER — SODIUM CHLORIDE 0.9 % IV BOLUS
500.0000 mL | Freq: Once | INTRAVENOUS | Status: AC
  Administered 2020-06-04: 500 mL via INTRAVENOUS

## 2020-06-04 MED ORDER — FENTANYL CITRATE (PF) 100 MCG/2ML IJ SOLN
100.0000 ug | Freq: Once | INTRAMUSCULAR | Status: AC
  Administered 2020-06-04: 100 ug via INTRAVENOUS
  Filled 2020-06-04: qty 2

## 2020-06-04 MED ORDER — FAMOTIDINE IN NACL 20-0.9 MG/50ML-% IV SOLN
20.0000 mg | Freq: Once | INTRAVENOUS | Status: AC
  Administered 2020-06-04: 20 mg via INTRAVENOUS
  Filled 2020-06-04: qty 50

## 2020-06-04 MED ORDER — MORPHINE SULFATE (PF) 4 MG/ML IV SOLN
4.0000 mg | Freq: Once | INTRAVENOUS | Status: AC
  Administered 2020-06-04: 4 mg via INTRAVENOUS
  Filled 2020-06-04: qty 1

## 2020-06-04 MED ORDER — METRONIDAZOLE IN NACL 5-0.79 MG/ML-% IV SOLN
500.0000 mg | Freq: Three times a day (TID) | INTRAVENOUS | Status: DC
  Administered 2020-06-05 (×2): 500 mg via INTRAVENOUS
  Filled 2020-06-04 (×2): qty 100

## 2020-06-04 MED ORDER — ONDANSETRON HCL 4 MG/2ML IJ SOLN
4.0000 mg | Freq: Once | INTRAMUSCULAR | Status: AC
  Administered 2020-06-04: 4 mg via INTRAVENOUS
  Filled 2020-06-04: qty 2

## 2020-06-04 MED ORDER — IOHEXOL 300 MG/ML  SOLN
100.0000 mL | Freq: Once | INTRAMUSCULAR | Status: AC | PRN
  Administered 2020-06-04: 100 mL via INTRAVENOUS

## 2020-06-04 MED ORDER — SODIUM CHLORIDE 0.9 % IV SOLN: Freq: Once | INTRAVENOUS | Status: AC

## 2020-06-04 MED ORDER — HYDROMORPHONE HCL 1 MG/ML IJ SOLN
0.5000 mg | Freq: Once | INTRAMUSCULAR | Status: AC
  Administered 2020-06-04: 0.5 mg via INTRAVENOUS
  Filled 2020-06-04: qty 1

## 2020-06-04 MED ORDER — CIPROFLOXACIN IN D5W 400 MG/200ML IV SOLN
400.0000 mg | Freq: Two times a day (BID) | INTRAVENOUS | Status: DC
  Administered 2020-06-05 (×2): 400 mg via INTRAVENOUS
  Filled 2020-06-04 (×2): qty 200

## 2020-06-04 MED ORDER — MORPHINE SULFATE (PF) 2 MG/ML IV SOLN
2.0000 mg | Freq: Once | INTRAVENOUS | Status: AC
  Administered 2020-06-04: 2 mg via INTRAVENOUS
  Filled 2020-06-04: qty 1

## 2020-06-04 NOTE — ED Triage Notes (Signed)
Pt c/o abd pain, blood in stools x 2 weeks-states she drank "beet root juice" x 1.5 weeks ago and knows that can change stool color -NAD-steady gait

## 2020-06-04 NOTE — ED Provider Notes (Signed)
[facility] EMERGENCY DEPARTMENT Provider Note   CSN: [id number] Arrival date & time: 06/04/20  1207     History Chief Complaint  Patient presents with  . Abdominal Pain    Abigail Hall is a 39 y.o. female.  The history is provided by the patient.  Abdominal Pain Pain location:  Generalized (worse in lower abd) Pain severity:  Severe Onset quality:  Gradual Timing:  Constant Progression:  Worsening Chronicity:  New Associated symptoms: nausea   Associated symptoms: no chest pain, no chills, no cough, no dysuria, no fever, no shortness of breath and no vomiting    Patient is a 39 year old female with a past medical history of anemia/thalassemia, hemorrhoids s/p hemorrhoidectomy, chronic rectal pain.  Past surgical history of abdominal hysterectomy, C-section, hemorrhoidectomy, gastric sleeve 2019  Patient is presented today with complaint of generalized abdominal pain that is severe, no aggravating or mitigating factors apart from moving.  She does note some bright red blood in her stool for the past 2 weeks and states that her pain started approximately around the same time.  She states the pain is 10/10, seems to be her entire abdomen.  She has some associated nausea but no vomiting.  No other significant changes with her stool she has had no diarrhea.  No constipation.  No fevers.  She denies any cough cold congestion, shortness of breath chest pain lightheadedness or dizziness.      Past Medical History:  Diagnosis Date  . Anemia   . Anemia, iron deficiency 04/10/2012  . Constipation   . Depression 04/26/13   history - no current problems  . Generalized headaches   . Hemorrhoids   . History of blood transfusion 02/2013   [facility]  2 units transfused   . Nausea & vomiting    resolved after delivery - C/S, extreme  . Rectal bleeding   . Rectal pain   . Seasonal allergies   . Seizures ([place])    last seizure was  08/2012  . Thalassemia   .  Weakness    resolved after C/S delivery    Patient Active Problem List   Diagnosis Date Noted  . Left carpal tunnel syndrome 05/27/2015  . Low back pain 03/05/2014  . Constipation, chronic 05/14/2013  . Obesity, Class III, BMI 40-49.9 (morbid obesity) ([place]) 05/14/2013  . Microcytic anemia 03/02/2013  . UTI (urinary tract infection) 03/02/2013  . Anemia, iron deficiency 04/10/2012  . 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 Hemorrhoids, internal, with bleeding s/p [facility] 05/31/2013 12/13/2011  . External hemorrhoids with pain s/p removal 05/31/2013 12/13/2011  . Anemia 12/13/2011  . Beta thalassemia minor 07/15/2011  . Medication exposure during first trimester of pregnancy 07/15/2011    Past Surgical History:  Procedure Laterality Date  . ABDOMINAL HYSTERECTOMY    . CARPAL TUNNEL RELEASE  01/2009   right  . CESAREAN SECTION  12/31/2011   Procedure: CESAREAN SECTION;  Surgeon: Jolayne Haines, MD;  Location: [facility];  Service: Gynecology;  Laterality: N/A;  . DILATION AND CURETTAGE OF UTERUS     endometrial ablation  . EVALUATION UNDER ANESTHESIA WITH HEMORRHOIDECTOMY N/A 05/31/2013   Procedure: EXAM UNDER ANESTHESIA WITH EXTERNAL HEMORRHOIDECTOMY;  Surgeon: Adin Hector, MD;  Location: [facility];  Service: General;  Laterality: N/A;  . LABIOPLASTY  03/27/2012   Procedure: LABIAPLASTY;  Surgeon: Jolayne Haines, MD;  Location: [facility];  Service: Gynecology;  Laterality: N/A;  labia  . LAPAROSCOPIC TUBAL LIGATION  03/27/2012  Procedure: LAPAROSCOPIC TUBAL LIGATION;  Surgeon: Jolayne Haines, MD;  Location: [facility];  Service: Gynecology;  Laterality: Bilateral;  fallopian tubes caurtery  . LAPAROSCOPY     removal of cyst  . ROBOTIC ASSISTED TOTAL HYSTERECTOMY N/A 05/02/2013   Procedure: ROBOTIC ASSISTED TOTAL HYSTERECTOMY;  Surgeon: Marvene Staff, MD;  Location: [facility];  Service: Gynecology;  Laterality: N/A;  . TONGUE SURGERY    . TRANSANAL HEMORRHOIDAL DEARTERIALIZATION  N/A 05/31/2013   Procedure: [place] HEMORRHOIDAL LIGATION/PEXY;  Surgeon: Adin Hector, MD;  Location: [facility];  Service: General;  Laterality: N/A;  . TUBAL LIGATION    . UNILATERAL SALPINGECTOMY Right 05/02/2013   Procedure: UNILATERAL SALPINGECTOMY;  Surgeon: Marvene Staff, MD;  Location: [facility];  Service: Gynecology;  Laterality: Right;  . WISDOM TOOTH EXTRACTION       OB History    Gravida  3   Para  1   Term  0   Preterm  1   AB  2   Living  1     SAB  1   TAB  1   Ectopic  0   Multiple  0   Live Births  1           Family History  Problem Relation Age of Onset  . Diabetes Mother   . Hyperlipidemia Father   . Cancer Maternal Grandfather   . Hyperlipidemia Paternal Grandfather   . Diabetes Maternal Grandmother   . Anesthesia problems Neg Hx   . Hypotension Neg Hx   . Malignant hyperthermia Neg Hx   . Pseudochol deficiency Neg Hx     Social History   Tobacco Use  . Smoking status: Never Smoker  . Smokeless tobacco: Never Used  Vaping Use  . Vaping Use: Never used  Substance Use Topics  . Alcohol use: Yes    Comment: occ  . Drug use: No    Home Medications Prior to Admission medications   Medication Sig Start Date End Date Taking? Authorizing Provider  ALPRAZolam Duanne Moron) 1 MG tablet Take 1 mg by mouth 3 (three) times daily as needed for anxiety.    [provider]  Biotin 10000 MCG TBDP Take 10,000 mcg by mouth daily.    [provider]  divalproex (DEPAKOTE) 500 MG DR tablet Take 2 tablets (1,000 mg total) by mouth 2 (two) times daily. 12/03/18   Melvenia Beam, MD  Fluocinolone Acetonide Scalp 0.01 % OIL APPLY EXTERNALLY TO THE AFFECTED AREA DAILY AS NEEDED 04/28/19   Melvenia Beam, MD  fluticasone (FLONASE) 50 MCG/ACT nasal spray Place 2 sprays daily into both nostrils. Patient taking differently: Place 2 sprays into both nostrils as needed.  06/29/17   Saguier, Percell Miller, PA-C  ketoconazole (NIZORAL) 2 % shampoo  APPLY EXTERNALLY 2 TIMES A WEEK 04/28/19   Sarina Ill B, MD  polyethylene glycol powder (GLYCOLAX/MIRALAX) 17 GM/SCOOP powder Take 1 Container by mouth as needed. 1 scoop as needed    [provider]  vitamin C (ASCORBIC ACID) 500 MG tablet Take 500 mg by mouth daily.    [provider]    Allergies    Cephalexin and Latex  Review of Systems   Review of Systems  Constitutional: Negative for chills and fever.  HENT: Negative for congestion.   Eyes: Negative for pain.  Respiratory: Negative for cough and shortness of breath.   Cardiovascular: Negative for chest pain and leg swelling.  Gastrointestinal: Positive for abdominal pain, blood in  stool and nausea. Negative for vomiting.  Genitourinary: Negative for dysuria.  Musculoskeletal: Negative for myalgias.  Skin: Negative for rash.  Neurological: Negative for dizziness and headaches.    Physical Exam Updated Vital Signs BP 116/71 (BP Location: Left Arm)   Pulse 68   Temp 98.7 F (37.1 C) (Oral)   Resp 18   Ht 5\' 4"  (1.626 m)   Wt 91.6 kg   LMP 04/25/2013   SpO2 100%   BMI 34.67 kg/m   Physical Exam Vitals and nursing note reviewed.  Constitutional:      General: She is in acute distress.  HENT:     Head: Normocephalic and atraumatic.     Nose: Nose normal.  Eyes:     General: No scleral icterus. Cardiovascular:     Rate and Rhythm: Normal rate and regular rhythm.     Pulses: Normal pulses.     Heart sounds: Normal heart sounds.  Pulmonary:     Effort: Pulmonary effort is normal. No respiratory distress.     Breath sounds: Normal breath sounds. No wheezing.  Abdominal:     Palpations: Abdomen is soft.     Tenderness: There is abdominal tenderness in the right lower quadrant, epigastric area, periumbilical area, suprapubic area and left lower quadrant.     Comments: Diffuse tenderness to palpation.  Patient is voluntarily guarding but no involuntary guarding.  She does seem to have no  tenderness in the right upper quadrant or left upper quadrant of the abdomen.  Abdominal bruising.  Negative psoas and obturator.  Genitourinary:    Comments: Large external hemorrhoids. Vulva without lesions or abnormality Vaginal canal without abnormal discharge or lesion Cervix appears normal, is closed Significant discomfort with pelvic exam questionable bilateral adnexal tenderness no cervical motion tenderness. Musculoskeletal:     Cervical back: Normal range of motion.     Right lower leg: No edema.     Left lower leg: No edema.  Skin:    General: Skin is warm and dry.     Capillary Refill: Capillary refill takes less than 2 seconds.  Neurological:     Mental Status: She is alert. Mental status is at baseline.  Psychiatric:        Mood and Affect: Mood normal.        Behavior: Behavior normal.     ED Results / Procedures / Treatments   Labs (all labs ordered are listed, but only abnormal results are displayed) Labs Reviewed  CBC WITH DIFFERENTIAL/PLATELET - Abnormal; Notable for the following components:      Result Value   Hemoglobin 8.6 (*)    HCT 28.2 (*)    MCV 60.4 (*)    MCH 18.4 (*)    RDW 18.0 (*)    nRBC 0.4 (*)    All other components within normal limits  COMPREHENSIVE METABOLIC PANEL - Abnormal; Notable for the following components:   Calcium 8.7 (*)    AST 11 (*)    All other components within normal limits  WET PREP, GENITAL  LIPASE, BLOOD  PREGNANCY, URINE  OCCULT BLOOD X 1 CARD TO LAB, STOOL  GC/CHLAMYDIA PROBE AMP ([place]) NOT AT [facility]    EKG None  Radiology CT ABDOMEN PELVIS W CONTRAST  Result Date: 06/04/2020 CLINICAL DATA:  Right lower quadrant pain. EXAM: CT ABDOMEN AND PELVIS WITH CONTRAST TECHNIQUE: Multidetector CT imaging of the abdomen and pelvis was performed using the standard protocol following bolus administration of intravenous contrast. CONTRAST:  159mL  OMNIPAQUE IOHEXOL 300 MG/ML  SOLN COMPARISON:  February 27, 2013  FINDINGS: Lower chest: No acute abnormality. Hepatobiliary: No focal liver abnormality is seen. No gallstones, gallbladder wall thickening, or biliary dilatation. Pancreas: Unremarkable. No pancreatic ductal dilatation or surrounding inflammatory changes. Spleen: Normal in size without focal abnormality. Adrenals/Urinary Tract: Adrenal glands are unremarkable. Kidneys are normal, without renal calculi, focal lesion, or hydronephrosis. The urinary bladder is contracted and subsequently limited in evaluation. Mild diffuse urinary bladder wall thickening is noted. Stomach/Bowel: There is a small hiatal hernia. Multiple surgical sutures and surgical clips are seen within the gastric region. A very mild amount of inflammatory fat stranding is seen surrounding the base of the appendix (axial CT images 43 and 44, CT series number 2). The appendix is otherwise normal in appearance (axial CT images 43 through 50, CT series number 2/coronal reformatted images 47 through 51, CT series number 8). No evidence of bowel dilatation. Vascular/Lymphatic: No significant vascular findings are present. No enlarged abdominal or pelvic lymph nodes. Reproductive: Status post hysterectomy. Ill-defined subcentimeter cysts are seen involving the bilateral adnexa (axial CT images 60 through 66, CT series number 2). Other: No abdominal wall hernia or abnormality. No abdominopelvic ascites. Musculoskeletal: No acute or significant osseous findings. IMPRESSION: 1. Very mild inflammatory fat stranding surrounding the base of the appendix which may represent early/mild appendicitis. 2. Mild diffuse urinary bladder wall thickening, which may be, in part, secondary to the partially contracted state of the urinary bladder. Correlation with urinalysis is recommended to exclude the presence of an underlying cystitis. 3. Small hiatal hernia with evidence of prior gastric surgery. 4. Ill-defined subcentimeter cysts involving the bilateral adnexa, likely  ovarian in origin. Correlation with pelvic ultrasound is recommended. Electronically Signed   By: Virgina Norfolk M.D.   On: 06/04/2020 15:20    Procedures Procedures (including critical care time)  Medications Ordered in ED Medications  0.9 %  sodium chloride infusion ( Intravenous Stopped 06/04/20 1450)  ondansetron (ZOFRAN) injection 4 mg (4 mg Intravenous Given 06/04/20 1340)  morphine 2 MG/ML injection 2 mg (2 mg Intravenous Given 06/04/20 1340)  famotidine (PEPCID) IVPB 20 mg premix (0 mg Intravenous Stopped 06/04/20 1420)  iohexol (OMNIPAQUE) 300 MG/ML solution 100 mL (100 mLs Intravenous Contrast Given 06/04/20 1451)  morphine 4 MG/ML injection 4 mg (4 mg Intravenous Given 06/04/20 1538)    ED Course  I have reviewed the triage vital signs and the nursing notes.  Pertinent labs & imaging results that were available during my care of the patient were reviewed by me and considered in my medical decision making (see chart for details).   Patient is a 39 year old female with a past medical history of anemia/thalassemia, hemorrhoids s/p hemorrhoidectomy, chronic rectal pain.  Past surgical history of abdominal hysterectomy, C-section, hemorrhoidectomy, gastric sleeve 2019.   Patient is presented today for abdominal pain.  Seems to be most significant in the right lower quadrant but she does have suprapubic, left lower quadrant, right lower quadrant and umbilical tenderness to palpation.  She is overall well-appearing but is complaining of significant pain. She is also endorsing bright red blood in her stool however this is not seen on my rectal examination which was normal apart from hemorrhoids present.  Guaiac was negative.  Pelvic exam was also done which was notable for some diffuse tenderness but no focal adnexal tenderness and no cervical motion tenderness.  CBC with significant anemia patient has history of thalassemia 8.6 is lower than some of her more  recent numbers.  She  denies any lightheadedness dizziness, shortness of breath or chest pain. She denies any orthostatic sx. she also does have some nucleated RBCs consistent with her disease.  This seems to be a chronic issue.  No leukocytosis today.  Lipase within normal limits doubt pancreatitis. CMP without any significant electrolyte abnormalities very mild hypocalcemia.  No other significant dyscrasias. Urine pregnancy is negative doubt ectopic. Wet prep with clue cells present she denies any vaginal symptoms shared decision-making conversation patient she would like to defer antibiotics at this time.  The causes of generalized abdominal pain include but are not limited to AAA, mesenteric ischemia, appendicitis, diverticulitis, DKA, gastritis, gastroenteritis, AMI, nephrolithiasis, pancreatitis, peritonitis, adrenal insufficiency,lead poisoning, iron toxicity, intestinal ischemia, constipation, UTI,SBO/LBO, splenic rupture, biliary disease, IBD, IBS, PUD, or hepatitis. Ectopic pregnancy, ovarian torsion, PID.  At this time I have lower suspicion for the above diagnoses although lower abdominal pain is somewhat concerning for diverticulitis given her blood in the stool.  Will obtain CT imaging.  Clinical Course as of Jun 04 1600  Wed Jun 04, 2020  1531 IMPRESSION: 1. Very mild inflammatory fat stranding surrounding the base of the appendix which may represent early/mild appendicitis. 2. Mild diffuse urinary bladder wall thickening, which may be, in part, secondary to the partially contracted state of the urinary bladder. Correlation with urinalysis is recommended to exclude the presence of an underlying cystitis. 3. Small hiatal hernia with evidence of prior gastric surgery. 4. Ill-defined subcentimeter cysts involving the bilateral adnexa, likely ovarian in origin. Correlation with pelvic ultrasound is recommended.   [WF]    Clinical Course User Index [WF] Tedd Sias, PA    I discussed this case  with my attending physician who cosigned this note including patient's presenting symptoms, physical exam, and planned diagnostics and interventions. Attending physician stated agreement with plan or made changes to plan which were implemented.     MDM Rules/Calculators/A&P                          Urine still pending at this time as well as GC chlamydia.  ED to ED transfer to [facility] per Jerene Pitch w general surgery.  Please contact gen surg at [facility] (Dr. Salvadore Dom) when she arrives [facility].  Discussed with Dr. Vanita Panda who accepts transfer.  Final Clinical Impression(s) / ED Diagnoses Final diagnoses:  Generalized abdominal pain  RLQ abdominal pain    Rx / DC Orders ED Discharge Orders    None       Tedd Sias, [place] 06/04/20 1632    Maudie Flakes, MD 06/04/20 2246

## 2020-06-04 NOTE — ED Provider Notes (Signed)
°  Physical Exam  BP 105/81    Pulse 81    Temp 98.7 F (37.1 C) (Oral)    Resp 17    Ht 5\' 4"  (1.626 m)    Wt 91.6 kg    LMP 04/25/2013    SpO2 100%    BMI 34.67 kg/m   Physical Exam Vitals and nursing note reviewed.  Constitutional:      General: She is not in acute distress.    Appearance: She is well-developed.     Comments: Appears nontoxic  HENT:     Head: Normocephalic and atraumatic.  Pulmonary:     Effort: Pulmonary effort is normal.  Abdominal:     General: There is no distension.     Tenderness: There is abdominal tenderness in the right lower quadrant.  Musculoskeletal:        General: Normal range of motion.     Cervical back: Normal range of motion.  Skin:    General: Skin is warm.     Findings: No rash.  Neurological:     Mental Status: She is alert and oriented to person, place, and time.       ED Course/Procedures   Clinical Course as of Jun 04 1816  Wed Jun 04, 2020  1531 IMPRESSION: 1. Very mild inflammatory fat stranding surrounding the base of the appendix which may represent early/mild appendicitis. 2. Mild diffuse urinary bladder wall thickening, which may be, in part, secondary to the partially contracted state of the urinary bladder. Correlation with urinalysis is recommended to exclude the presence of an underlying cystitis. 3. Small hiatal hernia with evidence of prior gastric surgery. 4. Ill-defined subcentimeter cysts involving the bilateral adnexa, likely ovarian in origin. Correlation with pelvic ultrasound is recommended.   [WF]    Clinical Course User Index [WF] Tedd Sias, [place]    Procedures  MDM   Pt transferred from [facility] for concerns for appendicitis.   In brief, patient presented for evaluation of abdominal pain, nausea, chills.  Work-up showed inflammation concerning for possible appendicitis.  Patient also with what appeared to be bilateral ovarian cysts.  Case was discussed with general surgery,  requested that patient be transferred from med center to [place].  My evaluation, patient reports pain is dull severe.  Nausea is controlled.  Vital signs are stable.  Will consult general surgery.  Discussed with Dr. Marlou Starks from general surgery, he will evaluate the patient.      Franchot Heidelberg, PA-C 06/04/20 1846    Lucrezia Starch, MD 06/08/20 [id number]

## 2020-06-04 NOTE — ED Notes (Signed)
She has just arrived via [facility] to our rm. Abigail Hall. She is awake, alert and in no distress. Accepted earlier by Dr. Alvino Chapel. Appendicitis vs. Gyn. Problem.

## 2020-06-04 NOTE — H&P (Signed)
Abigail Hall is an 39 y.o. female.   Chief Complaint: abd pain HPI: The patient is a 39 year old white female who presents with abdominal pain that has been going on for the last 2 weeks.  She describes the pain as constant and moderate to severe.  She has had some nausea but no vomiting.  She states that it hurts to have a bowel movement and she has noticed some blood in her stool.  She complains of pain in all 4 quadrants of the abdomen.  She denies any fevers or chills.  She has had some nausea but no vomiting.  She came to the emergency department where a CT scan showed some inflammatory change near the appendix.  Her white count was normal.  She denies any fever.  Past Medical History:  Diagnosis Date  . Anemia   . Anemia, iron deficiency 04/10/2012  . Constipation   . Depression 04/26/13   history - no current problems  . Generalized headaches   . Hemorrhoids   . History of blood transfusion 02/2013   [facility]  2 units transfused   . Nausea & vomiting    resolved after delivery - C/S, extreme  . Rectal bleeding   . Rectal pain   . Seasonal allergies   . Seizures ([place])    last seizure was  08/2012  . Thalassemia   . Weakness    resolved after C/S delivery    Past Surgical History:  Procedure Laterality Date  . ABDOMINAL HYSTERECTOMY    . CARPAL TUNNEL RELEASE  01/2009   right  . CESAREAN SECTION  12/31/2011   Procedure: CESAREAN SECTION;  Surgeon: Jolayne Haines, MD;  Location: [facility];  Service: Gynecology;  Laterality: N/A;  . DILATION AND CURETTAGE OF UTERUS     endometrial ablation  . EVALUATION UNDER ANESTHESIA WITH HEMORRHOIDECTOMY N/A 05/31/2013   Procedure: EXAM UNDER ANESTHESIA WITH EXTERNAL HEMORRHOIDECTOMY;  Surgeon: Adin Hector, MD;  Location: [facility];  Service: General;  Laterality: N/A;  . LABIOPLASTY  03/27/2012   Procedure: LABIAPLASTY;  Surgeon: Jolayne Haines, MD;  Location: [facility];  Service: Gynecology;  Laterality: N/A;  labia  . LAPAROSCOPIC  TUBAL LIGATION  03/27/2012   Procedure: LAPAROSCOPIC TUBAL LIGATION;  Surgeon: Jolayne Haines, MD;  Location: [facility];  Service: Gynecology;  Laterality: Bilateral;  fallopian tubes caurtery  . LAPAROSCOPY     removal of cyst  . ROBOTIC ASSISTED TOTAL HYSTERECTOMY N/A 05/02/2013   Procedure: ROBOTIC ASSISTED TOTAL HYSTERECTOMY;  Surgeon: Marvene Staff, MD;  Location: [facility];  Service: Gynecology;  Laterality: N/A;  . TONGUE SURGERY    . TRANSANAL HEMORRHOIDAL DEARTERIALIZATION N/A 05/31/2013   Procedure: [place] HEMORRHOIDAL LIGATION/PEXY;  Surgeon: Adin Hector, MD;  Location: [facility];  Service: General;  Laterality: N/A;  . TUBAL LIGATION    . UNILATERAL SALPINGECTOMY Right 05/02/2013   Procedure: UNILATERAL SALPINGECTOMY;  Surgeon: Marvene Staff, MD;  Location: [facility];  Service: Gynecology;  Laterality: Right;  . WISDOM TOOTH EXTRACTION      Family History  Problem Relation Age of Onset  . Diabetes Mother   . Hyperlipidemia Father   . Cancer Maternal Grandfather   . Hyperlipidemia Paternal Grandfather   . Diabetes Maternal Grandmother   . Anesthesia problems Neg Hx   . Hypotension Neg Hx   . Malignant hyperthermia Neg Hx   . Pseudochol deficiency Neg Hx    Social History:  reports that she has never smoked. She  has never used smokeless tobacco. She reports current alcohol use. She reports that she does not use drugs.  Allergies:  Allergies  Allergen Reactions  . Cephalexin Hives  . Latex Itching, Swelling and Rash    (Not in a hospital admission)   Results for orders placed or performed during the hospital encounter of 06/04/20 (from the past 48 hour(s))  CBC with Differential/Platelet     Status: Abnormal   Collection Time: 06/04/20  1:35 PM  Result Value Ref Range   WBC 6.9 4.0 - 10.5 K/uL   RBC 4.67 3.87 - 5.11 MIL/uL   Hemoglobin 8.6 (L) 12.0 - 15.0 g/dL    Comment: Reticulocyte Hemoglobin testing may be clinically indicated, consider ordering this  additional test [id number]    HCT 28.2 (L) 36 - 46 %   MCV 60.4 (L) 80.0 - 100.0 fL   MCH 18.4 (L) 26.0 - 34.0 pg   MCHC 30.5 30.0 - 36.0 g/dL   RDW 18.0 (H) 11.5 - 15.5 %   Platelets 259 150 - 400 K/uL   nRBC 0.4 (H) 0.0 - 0.2 %   Neutrophils Relative % 53 %   Neutro Abs 3.7 1.7 - 7.7 K/uL   Lymphocytes Relative 37 %   Lymphs Abs 2.6 0.7 - 4.0 K/uL   Monocytes Relative 6 %   Monocytes Absolute 0.4 0.1 - 1.0 K/uL   Eosinophils Relative 2 %   Eosinophils Absolute 0.2 0.0 - 0.5 K/uL   Basophils Relative 1 %   Basophils Absolute 0.0 0.0 - 0.1 K/uL   Immature Granulocytes 1 %   Abs Immature Granulocytes 0.04 0.00 - 0.07 K/uL    Comment: Performed at [facility], [address] [place], [place] [address]  Comprehensive metabolic panel     Status: Abnormal   Collection Time: 06/04/20  1:35 PM  Result Value Ref Range   Sodium 139 135 - 145 mmol/L   Potassium 4.1 3.5 - 5.1 mmol/L   Chloride 104 98 - 111 mmol/L   CO2 24 22 - 32 mmol/L   Glucose, Bld 90 70 - 99 mg/dL    Comment: Glucose reference range applies only to samples taken after fasting for at least 8 hours.   BUN 15 6 - 20 mg/dL   Creatinine, Ser 0.57 0.44 - 1.00 mg/dL   Calcium 8.7 (L) 8.9 - 10.3 mg/dL   Total Protein 6.5 6.5 - 8.1 g/dL   Albumin 3.6 3.5 - 5.0 g/dL   AST 11 (L) 15 - 41 U/L   ALT 9 0 - 44 U/L   Alkaline Phosphatase 45 38 - 126 U/L   Total Bilirubin 0.6 0.3 - 1.2 mg/dL   GFR, Estimated >60 >60 mL/min   Anion gap 11 5 - 15    Comment: Performed at [facility], [address] [place], [place] [address]  Lipase, blood     Status: None   Collection Time: 06/04/20  1:35 PM  Result Value Ref Range   Lipase 48 11 - 51 U/L    Comment: Performed at [facility], [address] [place], [place] [address]  Pregnancy, urine     Status: None   Collection Time: 06/04/20  1:43 PM  Result Value Ref Range   Preg Test, Ur NEGATIVE NEGATIVE    Comment:        THE SENSITIVITY OF  THIS METHODOLOGY IS >20 mIU/mL. Performed at [facility], [address] [place], [place] [address]   Urinalysis, Routine w reflex microscopic Urine, Clean Catch     Status: Abnormal   Collection Time: 06/04/20  1:43 PM  Result Value Ref Range   Color, Urine YELLOW YELLOW   APPearance CLEAR CLEAR   Specific Gravity, Urine 1.020 1.005 - 1.030   pH 7.5 5.0 - 8.0   Glucose, UA NEGATIVE NEGATIVE mg/dL   Hgb urine dipstick NEGATIVE NEGATIVE   Bilirubin Urine NEGATIVE NEGATIVE   Ketones, ur 15 (A) NEGATIVE mg/dL   Protein, ur NEGATIVE NEGATIVE mg/dL   Nitrite NEGATIVE NEGATIVE   Leukocytes,Ua NEGATIVE NEGATIVE    Comment: Microscopic not done on urines with negative protein, blood, leukocytes, nitrite, or glucose < 500 mg/dL. Performed at [facility], [address] [place], [place] [address]   Occult blood card to lab, stool     Status: None   Collection Time: 06/04/20  3:15 PM  Result Value Ref Range   Fecal Occult Bld NEGATIVE NEGATIVE    Comment: Performed at [facility], [address] [place], [place] [address]  Wet prep, genital     Status: Abnormal   Collection Time: 06/04/20  3:49 PM   Specimen: PATH Cytology Cervicovaginal Ancillary Only  Result Value Ref Range   Yeast Wet Prep HPF POC NONE SEEN NONE SEEN   Trich, Wet Prep NONE SEEN NONE SEEN   Clue Cells Wet Prep HPF POC PRESENT (A) NONE SEEN   WBC, Wet Prep HPF POC MODERATE (A) NONE SEEN   Sperm NONE SEEN     Comment: Performed at [facility], [address] [place], [place] [address]  Respiratory Panel by RT PCR (Flu A&B, Covid) - Nasopharyngeal Swab     Status: None   Collection Time: 06/04/20  4:21 PM   Specimen: Nasopharyngeal Swab  Result Value Ref Range   SARS Coronavirus 2 by RT PCR NEGATIVE NEGATIVE    Comment: (NOTE) SARS-CoV-2 target nucleic acids are NOT DETECTED.  The SARS-CoV-2 RNA is generally detectable in upper respiratoy specimens during the acute  phase of infection. The lowest concentration of SARS-CoV-2 viral copies this assay can detect is 131 copies/mL. A negative result does not preclude SARS-Cov-2 infection and should not be used as the sole basis for treatment or other patient management decisions. A negative result may occur with  improper specimen collection/handling, submission of specimen other than nasopharyngeal swab, presence of viral mutation(s) within the areas targeted by this assay, and inadequate number of viral copies (<131 copies/mL). A negative result must be combined with clinical observations, patient history, and epidemiological information. The expected result is Negative.  Fact Sheet for Patients:  PinkCheek.be  Fact Sheet for Healthcare Providers:  GravelBags.it  This test is no t yet approved or cleared by the [place] FDA and  has been authorized for detection and/or diagnosis of SARS-CoV-2 by FDA under an Emergency Use Authorization (EUA). This EUA will remain  in effect (meaning this test can be used) for the duration of the COVID-19 declaration under Section 564(b)(1) of the Act, 21 U.S.C. section 360bbb-3(b)(1), unless the authorization is terminated or revoked sooner.     Influenza A by PCR NEGATIVE NEGATIVE   Influenza B by PCR NEGATIVE NEGATIVE    Comment: (NOTE) The Xpert Xpress SARS-CoV-2/FLU/RSV assay is intended as an aid in  the diagnosis of influenza from Nasopharyngeal swab specimens and  should not be used as a sole basis  for treatment. Nasal washings and  aspirates are unacceptable for Xpert Xpress SARS-CoV-2/FLU/RSV  testing.  Fact Sheet for Patients: PinkCheek.be  Fact Sheet for Healthcare Providers: GravelBags.it  This test is not yet approved or cleared by the [place] FDA and  has been authorized for detection and/or diagnosis of SARS-CoV-2 by   FDA under an Emergency Use Authorization (EUA). This EUA will remain  in effect (meaning this test can be used) for the duration of the  Covid-19 declaration under Section 564(b)(1) of the Act, 21  U.S.C. section 360bbb-3(b)(1), unless the authorization is  terminated or revoked. Performed at [facility], [address] [place], [place] [address]    CT ABDOMEN PELVIS W CONTRAST  Result Date: 06/04/2020 CLINICAL DATA:  Right lower quadrant pain. EXAM: CT ABDOMEN AND PELVIS WITH CONTRAST TECHNIQUE: Multidetector CT imaging of the abdomen and pelvis was performed using the standard protocol following bolus administration of intravenous contrast. CONTRAST:  150mL OMNIPAQUE IOHEXOL 300 MG/ML  SOLN COMPARISON:  February 27, 2013 FINDINGS: Lower chest: No acute abnormality. Hepatobiliary: No focal liver abnormality is seen. No gallstones, gallbladder wall thickening, or biliary dilatation. Pancreas: Unremarkable. No pancreatic ductal dilatation or surrounding inflammatory changes. Spleen: Normal in size without focal abnormality. Adrenals/Urinary Tract: Adrenal glands are unremarkable. Kidneys are normal, without renal calculi, focal lesion, or hydronephrosis. The urinary bladder is contracted and subsequently limited in evaluation. Mild diffuse urinary bladder wall thickening is noted. Stomach/Bowel: There is a small hiatal hernia. Multiple surgical sutures and surgical clips are seen within the gastric region. A very mild amount of inflammatory fat stranding is seen surrounding the base of the appendix (axial CT images 43 and 44, CT series number 2). The appendix is otherwise normal in appearance (axial CT images 43 through 50, CT series number 2/coronal reformatted images 47 through 51, CT series number 8). No evidence of bowel dilatation. Vascular/Lymphatic: No significant vascular findings are present. No enlarged abdominal or pelvic lymph nodes. Reproductive: Status post hysterectomy.  Ill-defined subcentimeter cysts are seen involving the bilateral adnexa (axial CT images 60 through 66, CT series number 2). Other: No abdominal wall hernia or abnormality. No abdominopelvic ascites. Musculoskeletal: No acute or significant osseous findings. IMPRESSION: 1. Very mild inflammatory fat stranding surrounding the base of the appendix which may represent early/mild appendicitis. 2. Mild diffuse urinary bladder wall thickening, which may be, in part, secondary to the partially contracted state of the urinary bladder. Correlation with urinalysis is recommended to exclude the presence of an underlying cystitis. 3. Small hiatal hernia with evidence of prior gastric surgery. 4. Ill-defined subcentimeter cysts involving the bilateral adnexa, likely ovarian in origin. Correlation with pelvic ultrasound is recommended. Electronically Signed   By: Virgina Norfolk M.D.   On: 06/04/2020 15:20    Review of Systems  Constitutional: Positive for chills.  HENT: Negative.   Eyes: Negative.   Respiratory: Negative.   Cardiovascular: Negative.   Gastrointestinal: Positive for abdominal pain, blood in stool and nausea. Negative for vomiting.  Endocrine: Negative.   Genitourinary: Negative.   Musculoskeletal: Negative.   Skin: Negative.   Allergic/Immunologic: Negative.   Neurological: Positive for seizures.  Hematological: Negative.   Psychiatric/Behavioral: Negative.     Blood pressure 123/71, pulse 67, temperature 98.7 F (37.1 C), temperature source Oral, resp. rate 16, height 5\' 4"  (1.626 m), weight 91.6 kg, last menstrual period 04/25/2013, SpO2 100 %. Physical Exam Vitals reviewed.  Constitutional:      General: She is not in  acute distress.    Appearance: Normal appearance. She is obese.  HENT:     Head: Normocephalic and atraumatic.     Right Ear: External ear normal.     Left Ear: External ear normal.     Nose: Nose normal.     Mouth/Throat:     Mouth: Mucous membranes are moist.      Pharynx: Oropharynx is clear.  Eyes:     General: No scleral icterus.    Extraocular Movements: Extraocular movements intact.     Conjunctiva/sclera: Conjunctivae normal.     Pupils: Pupils are equal, round, and reactive to light.  Cardiovascular:     Rate and Rhythm: Normal rate and regular rhythm.     Pulses: Normal pulses.     Heart sounds: Normal heart sounds.     Comments: No pitting edema lower extr Pulmonary:     Effort: Pulmonary effort is normal. No respiratory distress.     Breath sounds: Normal breath sounds.  Abdominal:     General: Abdomen is flat.     Palpations: Abdomen is soft.     Comments: There is mild to moderate tenderness. Worse in RLQ but mildly tender everywhere  Musculoskeletal:        General: No tenderness or deformity. Normal range of motion.     Cervical back: Normal range of motion and neck supple. No tenderness.     Comments: Normal gait  Skin:    General: Skin is warm and dry.     Coloration: Skin is not jaundiced.  Neurological:     General: No focal deficit present.     Mental Status: She is alert and oriented to person, place, and time.  Psychiatric:        Mood and Affect: Mood normal.        Behavior: Behavior normal.      Assessment/Plan The patient may have an early appendicitis although some of her symptoms are little bit atypical and she does have a normal white count and no fever.  At this point we will plan to admit her to the hospital.  We will start her on some broad-spectrum antibiotic therapy in case she does have some early appendicitis or possible colitis causing her symptoms.  If her pain does not improve she may need a diagnostic laparoscopy and possible appendectomy if the appendix is abnormal.  I have discussed this with her and we will plan to admit her and discuss her care with the primary team in the morning.  Autumn Messing III, MD 06/04/2020, 10:48 PM

## 2020-06-04 NOTE — ED Notes (Signed)
Pt declines gown, states "he can look at my stomach when I lift my shirt up."

## 2020-06-04 NOTE — ED Notes (Signed)
ED TO INPATIENT HANDOFF REPORT  ED Nurse Name and Phone #: Clarise Cruz [phone]  S Name/Age/Gender Abigail Hall 39 y.o. female Room/Bed: RESB/RESB  Code Status   Code Status: Prior  Home/SNF/Other waiting for TC Patient oriented to: self, place, time and situation Is this baseline? Yes   Triage Complete: Triage complete  Chief Complaint Appendicitis [K37]  Triage Note Pt c/o abd pain, blood in stools x 2 weeks-states she drank "beet root juice" x 1.5 weeks ago and knows that can change stool color -NAD-steady gait    Allergies Allergies  Allergen Reactions  . Cephalexin Hives  . Latex Itching, Swelling and Rash    Level of Care/Admitting Diagnosis ED Disposition    ED Disposition Condition Comment   Admit  Hospital Area: [facility] [[id number]]  Level of Care: Med-Surg [16]  Covid Evaluation: Confirmed COVID Negative  Diagnosis: Appendicitis [[id number]]  Admitting Physician: Jovita Kussmaul [phone]  Attending Physician: CCS, MD [[id number]]       B Medical/Surgery History Past Medical History:  Diagnosis Date  . Anemia   . Anemia, iron deficiency 04/10/2012  . Constipation   . Depression 04/26/13   history - no current problems  . Generalized headaches   . Hemorrhoids   . History of blood transfusion 02/2013   [facility]  2 units transfused   . Nausea & vomiting    resolved after delivery - C/S, extreme  . Rectal bleeding   . Rectal pain   . Seasonal allergies   . Seizures ([place])    last seizure was  08/2012  . Thalassemia   . Weakness    resolved after C/S delivery   Past Surgical History:  Procedure Laterality Date  . ABDOMINAL HYSTERECTOMY    . CARPAL TUNNEL RELEASE  01/2009   right  . CESAREAN SECTION  12/31/2011   Procedure: CESAREAN SECTION;  Surgeon: Jolayne Haines, MD;  Location: [facility];  Service: Gynecology;  Laterality: N/A;  . DILATION AND CURETTAGE OF UTERUS     endometrial ablation  . EVALUATION UNDER ANESTHESIA WITH  HEMORRHOIDECTOMY N/A 05/31/2013   Procedure: EXAM UNDER ANESTHESIA WITH EXTERNAL HEMORRHOIDECTOMY;  Surgeon: Adin Hector, MD;  Location: [facility];  Service: General;  Laterality: N/A;  . LABIOPLASTY  03/27/2012   Procedure: LABIAPLASTY;  Surgeon: Jolayne Haines, MD;  Location: [facility];  Service: Gynecology;  Laterality: N/A;  labia  . LAPAROSCOPIC TUBAL LIGATION  03/27/2012   Procedure: LAPAROSCOPIC TUBAL LIGATION;  Surgeon: Jolayne Haines, MD;  Location: [facility];  Service: Gynecology;  Laterality: Bilateral;  fallopian tubes caurtery  . LAPAROSCOPY     removal of cyst  . ROBOTIC ASSISTED TOTAL HYSTERECTOMY N/A 05/02/2013   Procedure: ROBOTIC ASSISTED TOTAL HYSTERECTOMY;  Surgeon: Marvene Staff, MD;  Location: [facility];  Service: Gynecology;  Laterality: N/A;  . TONGUE SURGERY    . TRANSANAL HEMORRHOIDAL DEARTERIALIZATION N/A 05/31/2013   Procedure: [place] HEMORRHOIDAL LIGATION/PEXY;  Surgeon: Adin Hector, MD;  Location: [facility];  Service: General;  Laterality: N/A;  . TUBAL LIGATION    . UNILATERAL SALPINGECTOMY Right 05/02/2013   Procedure: UNILATERAL SALPINGECTOMY;  Surgeon: Marvene Staff, MD;  Location: [facility];  Service: Gynecology;  Laterality: Right;  . WISDOM TOOTH EXTRACTION       A IV Location/Drains/Wounds Patient Lines/Drains/Airways Status    Active Line/Drains/Airways    Name Placement date Placement time Site Days   Peripheral IV 06/04/20 Left Antecubital 06/04/20  1332  Antecubital  less than 1          Intake/Output Last 24 hours  Intake/Output Summary (Last 24 hours) at 06/04/2020 2257 Last data filed at 06/04/2020 1450 Gross per 24 hour  Intake 1044.2 ml  Output --  Net 1044.2 ml    Labs/Imaging Results for orders placed or performed during the hospital encounter of 06/04/20 (from the past 48 hour(s))  CBC with Differential/Platelet     Status: Abnormal   Collection Time: 06/04/20  1:35 PM  Result Value Ref Range   WBC 6.9 4.0 - 10.5 K/uL   RBC  4.67 3.87 - 5.11 MIL/uL   Hemoglobin 8.6 (L) 12.0 - 15.0 g/dL    Comment: Reticulocyte Hemoglobin testing may be clinically indicated, consider ordering this additional test [id number]    HCT 28.2 (L) 36 - 46 %   MCV 60.4 (L) 80.0 - 100.0 fL   MCH 18.4 (L) 26.0 - 34.0 pg   MCHC 30.5 30.0 - 36.0 g/dL   RDW 18.0 (H) 11.5 - 15.5 %   Platelets 259 150 - 400 K/uL   nRBC 0.4 (H) 0.0 - 0.2 %   Neutrophils Relative % 53 %   Neutro Abs 3.7 1.7 - 7.7 K/uL   Lymphocytes Relative 37 %   Lymphs Abs 2.6 0.7 - 4.0 K/uL   Monocytes Relative 6 %   Monocytes Absolute 0.4 0.1 - 1.0 K/uL   Eosinophils Relative 2 %   Eosinophils Absolute 0.2 0.0 - 0.5 K/uL   Basophils Relative 1 %   Basophils Absolute 0.0 0.0 - 0.1 K/uL   Immature Granulocytes 1 %   Abs Immature Granulocytes 0.04 0.00 - 0.07 K/uL    Comment: Performed at [facility], [address] [place], [place] [address]  Comprehensive metabolic panel     Status: Abnormal   Collection Time: 06/04/20  1:35 PM  Result Value Ref Range   Sodium 139 135 - 145 mmol/L   Potassium 4.1 3.5 - 5.1 mmol/L   Chloride 104 98 - 111 mmol/L   CO2 24 22 - 32 mmol/L   Glucose, Bld 90 70 - 99 mg/dL    Comment: Glucose reference range applies only to samples taken after fasting for at least 8 hours.   BUN 15 6 - 20 mg/dL   Creatinine, Ser 0.57 0.44 - 1.00 mg/dL   Calcium 8.7 (L) 8.9 - 10.3 mg/dL   Total Protein 6.5 6.5 - 8.1 g/dL   Albumin 3.6 3.5 - 5.0 g/dL   AST 11 (L) 15 - 41 U/L   ALT 9 0 - 44 U/L   Alkaline Phosphatase 45 38 - 126 U/L   Total Bilirubin 0.6 0.3 - 1.2 mg/dL   GFR, Estimated >60 >60 mL/min   Anion gap 11 5 - 15    Comment: Performed at [facility], [address] [place], [place] [address]  Lipase, blood     Status: None   Collection Time: 06/04/20  1:35 PM  Result Value Ref Range   Lipase 48 11 - 51 U/L    Comment: Performed at [facility], [address] [place], [place] [address]  Pregnancy,  urine     Status: None   Collection Time: 06/04/20  1:43 PM  Result Value Ref Range   Preg Test, Ur NEGATIVE NEGATIVE    Comment:        THE SENSITIVITY OF THIS METHODOLOGY IS >20 mIU/mL. Performed at [facility]  [employer], [address] [place], [place] [address]   Urinalysis, Routine w reflex microscopic Urine, Clean Catch     Status: Abnormal   Collection Time: 06/04/20  1:43 PM  Result Value Ref Range   Color, Urine YELLOW YELLOW   APPearance CLEAR CLEAR   Specific Gravity, Urine 1.020 1.005 - 1.030   pH 7.5 5.0 - 8.0   Glucose, UA NEGATIVE NEGATIVE mg/dL   Hgb urine dipstick NEGATIVE NEGATIVE   Bilirubin Urine NEGATIVE NEGATIVE   Ketones, ur 15 (A) NEGATIVE mg/dL   Protein, ur NEGATIVE NEGATIVE mg/dL   Nitrite NEGATIVE NEGATIVE   Leukocytes,Ua NEGATIVE NEGATIVE    Comment: Microscopic not done on urines with negative protein, blood, leukocytes, nitrite, or glucose < 500 mg/dL. Performed at [facility], [address] [place], [place] [address]   Occult blood card to lab, stool     Status: None   Collection Time: 06/04/20  3:15 PM  Result Value Ref Range   Fecal Occult Bld NEGATIVE NEGATIVE    Comment: Performed at [facility], [address] [place], [place] [address]  Wet prep, genital     Status: Abnormal   Collection Time: 06/04/20  3:49 PM   Specimen: PATH Cytology Cervicovaginal Ancillary Only  Result Value Ref Range   Yeast Wet Prep HPF POC NONE SEEN NONE SEEN   Trich, Wet Prep NONE SEEN NONE SEEN   Clue Cells Wet Prep HPF POC PRESENT (A) NONE SEEN   WBC, Wet Prep HPF POC MODERATE (A) NONE SEEN   Sperm NONE SEEN     Comment: Performed at [facility], [address] [place], [place] [address]  Respiratory Panel by RT PCR (Flu A&B, Covid) - Nasopharyngeal Swab     Status: None   Collection Time: 06/04/20  4:21 PM   Specimen: Nasopharyngeal Swab  Result Value Ref Range   SARS Coronavirus 2 by RT PCR NEGATIVE NEGATIVE     Comment: (NOTE) SARS-CoV-2 target nucleic acids are NOT DETECTED.  The SARS-CoV-2 RNA is generally detectable in upper respiratoy specimens during the acute phase of infection. The lowest concentration of SARS-CoV-2 viral copies this assay can detect is 131 copies/mL. A negative result does not preclude SARS-Cov-2 infection and should not be used as the sole basis for treatment or other patient management decisions. A negative result may occur with  improper specimen collection/handling, submission of specimen other than nasopharyngeal swab, presence of viral mutation(s) within the areas targeted by this assay, and inadequate number of viral copies (<131 copies/mL). A negative result must be combined with clinical observations, patient history, and epidemiological information. The expected result is Negative.  Fact Sheet for Patients:  PinkCheek.be  Fact Sheet for Healthcare Providers:  GravelBags.it  This test is no t yet approved or cleared by the [place] FDA and  has been authorized for detection and/or diagnosis of SARS-CoV-2 by FDA under an Emergency Use Authorization (EUA). This EUA will remain  in effect (meaning this test can be used) for the duration of the COVID-19 declaration under Section 564(b)(1) of the Act, 21 U.S.C. section 360bbb-3(b)(1), unless the authorization is terminated or revoked sooner.     Influenza A by PCR NEGATIVE NEGATIVE   Influenza B by PCR NEGATIVE NEGATIVE    Comment: (NOTE) The Xpert Xpress SARS-CoV-2/FLU/RSV assay is intended as an aid in  the diagnosis of influenza from Nasopharyngeal swab specimens and  should not be used  as a sole basis for treatment. Nasal washings and  aspirates are unacceptable for Xpert Xpress SARS-CoV-2/FLU/RSV  testing.  Fact Sheet for Patients: PinkCheek.be  Fact Sheet for Healthcare  Providers: GravelBags.it  This test is not yet approved or cleared by the [place] FDA and  has been authorized for detection and/or diagnosis of SARS-CoV-2 by  FDA under an Emergency Use Authorization (EUA). This EUA will remain  in effect (meaning this test can be used) for the duration of the  Covid-19 declaration under Section 564(b)(1) of the Act, 21  U.S.C. section 360bbb-3(b)(1), unless the authorization is  terminated or revoked. Performed at [facility], [address] [place], [place] [address]    CT ABDOMEN PELVIS W CONTRAST  Result Date: 06/04/2020 CLINICAL DATA:  Right lower quadrant pain. EXAM: CT ABDOMEN AND PELVIS WITH CONTRAST TECHNIQUE: Multidetector CT imaging of the abdomen and pelvis was performed using the standard protocol following bolus administration of intravenous contrast. CONTRAST:  147mL OMNIPAQUE IOHEXOL 300 MG/ML  SOLN COMPARISON:  February 27, 2013 FINDINGS: Lower chest: No acute abnormality. Hepatobiliary: No focal liver abnormality is seen. No gallstones, gallbladder wall thickening, or biliary dilatation. Pancreas: Unremarkable. No pancreatic ductal dilatation or surrounding inflammatory changes. Spleen: Normal in size without focal abnormality. Adrenals/Urinary Tract: Adrenal glands are unremarkable. Kidneys are normal, without renal calculi, focal lesion, or hydronephrosis. The urinary bladder is contracted and subsequently limited in evaluation. Mild diffuse urinary bladder wall thickening is noted. Stomach/Bowel: There is a small hiatal hernia. Multiple surgical sutures and surgical clips are seen within the gastric region. A very mild amount of inflammatory fat stranding is seen surrounding the base of the appendix (axial CT images 43 and 44, CT series number 2). The appendix is otherwise normal in appearance (axial CT images 43 through 50, CT series number 2/coronal reformatted images 47 through 51, CT series number  8). No evidence of bowel dilatation. Vascular/Lymphatic: No significant vascular findings are present. No enlarged abdominal or pelvic lymph nodes. Reproductive: Status post hysterectomy. Ill-defined subcentimeter cysts are seen involving the bilateral adnexa (axial CT images 60 through 66, CT series number 2). Other: No abdominal wall hernia or abnormality. No abdominopelvic ascites. Musculoskeletal: No acute or significant osseous findings. IMPRESSION: 1. Very mild inflammatory fat stranding surrounding the base of the appendix which may represent early/mild appendicitis. 2. Mild diffuse urinary bladder wall thickening, which may be, in part, secondary to the partially contracted state of the urinary bladder. Correlation with urinalysis is recommended to exclude the presence of an underlying cystitis. 3. Small hiatal hernia with evidence of prior gastric surgery. 4. Ill-defined subcentimeter cysts involving the bilateral adnexa, likely ovarian in origin. Correlation with pelvic ultrasound is recommended. Electronically Signed   By: Virgina Norfolk M.D.   On: 06/04/2020 15:20    Pending Labs [employer] (From admission, onward)          Start     Ordered   Signed and Held  HIV Antibody (routine testing w rflx)  (HIV Antibody (Routine testing w reflex) panel)  Once,   R        Signed and Held   Signed and Held  Basic metabolic panel  Tomorrow morning,   R        Signed and Held   Signed and Held  CBC  Tomorrow morning,   R        Signed and Held          Vitals/Pain Today's Vitals  06/04/20 1900 06/04/20 1930 06/04/20 2000 06/04/20 2030  BP: 122/74 111/75 116/67 123/71  Pulse: 71 62 79 67  Resp: 15 12 17 16   Temp:      TempSrc:      SpO2: 100% 100% 100% 100%  Weight:      Height:      PainSc:        Isolation Precautions No active isolations  Medications Medications  ciprofloxacin (CIPRO) IVPB 400 mg (has no administration in time range)    And  metroNIDAZOLE (FLAGYL)  IVPB 500 mg (has no administration in time range)  0.9 %  sodium chloride infusion ( Intravenous Stopped 06/04/20 1450)  ondansetron (ZOFRAN) injection 4 mg (4 mg Intravenous Given 06/04/20 1340)  morphine 2 MG/ML injection 2 mg (2 mg Intravenous Given 06/04/20 1340)  famotidine (PEPCID) IVPB 20 mg premix (0 mg Intravenous Stopped 06/04/20 1420)  iohexol (OMNIPAQUE) 300 MG/ML solution 100 mL (100 mLs Intravenous Contrast Given 06/04/20 1451)  morphine 4 MG/ML injection 4 mg (4 mg Intravenous Given 06/04/20 1538)  fentaNYL (SUBLIMAZE) injection 100 mcg (100 mcg Intravenous Given 06/04/20 1701)  fentaNYL (SUBLIMAZE) injection 100 mcg (100 mcg Intravenous Given 06/04/20 1831)  sodium chloride 0.9 % bolus 500 mL (0 mLs Intravenous Stopped 06/04/20 1952)  fentaNYL (SUBLIMAZE) injection 100 mcg (100 mcg Intravenous Given 06/04/20 1949)  HYDROmorphone (DILAUDID) injection 0.5 mg (0.5 mg Intravenous Given 06/04/20 2138)    Mobility non-ambulatory Low fall risk   Focused Assessments    R Recommendations: See Admitting Provider Note  Report given to:   Additional Notes:

## 2020-06-05 ENCOUNTER — Observation Stay (HOSPITAL_COMMUNITY): Payer: BC Managed Care – PPO | Admitting: Certified Registered Nurse Anesthetist

## 2020-06-05 ENCOUNTER — Encounter (HOSPITAL_COMMUNITY): Payer: Self-pay | Admitting: General Surgery

## 2020-06-05 ENCOUNTER — Encounter (HOSPITAL_COMMUNITY)
Admission: EM | Disposition: A | Discharge status: Home / Self Care | Payer: Self-pay | Source: Home / Self Care | Attending: Emergency Medicine

## 2020-06-05 DIAGNOSIS — K648 Other hemorrhoids: Secondary | ICD-10-CM | POA: Diagnosis not present

## 2020-06-05 DIAGNOSIS — G5602 Carpal tunnel syndrome, left upper limb: Secondary | ICD-10-CM | POA: Diagnosis not present

## 2020-06-05 DIAGNOSIS — D509 Iron deficiency anemia, unspecified: Secondary | ICD-10-CM | POA: Diagnosis not present

## 2020-06-05 DIAGNOSIS — K3580 Unspecified acute appendicitis: Secondary | ICD-10-CM | POA: Diagnosis not present

## 2020-06-05 HISTORY — PX: LAPAROSCOPIC APPENDECTOMY: SHX408

## 2020-06-05 LAB — BASIC METABOLIC PANEL
Anion gap: 8 (ref 5–15)
BUN: 12 mg/dL (ref 6–20)
CO2: 24 mmol/L (ref 22–32)
Calcium: 8.3 mg/dL — ABNORMAL LOW (ref 8.9–10.3)
Chloride: 106 mmol/L (ref 98–111)
Creatinine, Ser: 0.62 mg/dL (ref 0.44–1.00)
GFR, Estimated: >60 mL/min (ref >60)
Glucose, Bld: 105 mg/dL — ABNORMAL HIGH (ref 70–99)
Potassium: 3.4 mmol/L — ABNORMAL LOW (ref 3.5–5.1)
Sodium: 138 mmol/L (ref 135–145)

## 2020-06-05 LAB — TYPE AND SCREEN
ABO/RH(D): O POS
Antibody Screen: NEGATIVE

## 2020-06-05 LAB — CBC
HCT: 26.7 % — ABNORMAL LOW (ref 36.0–46.0)
Hemoglobin: 7.9 g/dL — ABNORMAL LOW (ref 12.0–15.0)
MCH: 18.3 pg — ABNORMAL LOW (ref 26.0–34.0)
MCHC: 29.6 g/dL — ABNORMAL LOW (ref 30.0–36.0)
MCV: 61.9 fL — ABNORMAL LOW (ref 80.0–100.0)
Platelets: 250 10*3/uL (ref 150–400)
RBC: 4.31 MIL/uL (ref 3.87–5.11)
RDW: 17.8 % — ABNORMAL HIGH (ref 11.5–15.5)
WBC: 6.1 10*3/uL (ref 4.0–10.5)
nRBC: 0.8 % — ABNORMAL HIGH (ref 0.0–0.2)

## 2020-06-05 LAB — IRON AND TIBC
Iron: 66 ug/dL (ref 28–170)
Saturation Ratios: 26 % (ref 10.4–31.8)
TIBC: 249 ug/dL — ABNORMAL LOW (ref 250–450)
UIBC: 183 ug/dL

## 2020-06-05 LAB — GC/CHLAMYDIA PROBE AMP (~~LOC~~) NOT AT ARMC
Chlamydia: NEGATIVE
Comment: NEGATIVE
Comment: NORMAL
Neisseria Gonorrhea: NEGATIVE

## 2020-06-05 LAB — HIV ANTIBODY (ROUTINE TESTING W REFLEX): HIV Screen 4th Generation wRfx: NONREACTIVE

## 2020-06-05 LAB — FERRITIN: Ferritin: 1129 ng/mL — ABNORMAL HIGH (ref 11–307)

## 2020-06-05 SURGERY — APPENDECTOMY, LAPAROSCOPIC
Anesthesia: General | Site: Abdomen

## 2020-06-05 MED ORDER — FENTANYL CITRATE (PF) 250 MCG/5ML IJ SOLN
INTRAMUSCULAR | Status: DC | PRN
Start: 2020-06-05 — End: 2020-06-05
  Administered 2020-06-05 (×2): 50 ug via INTRAVENOUS

## 2020-06-05 MED ORDER — ACETAMINOPHEN 325 MG PO TABS
650.0000 mg | ORAL_TABLET | Freq: Four times a day (QID) | ORAL | Status: DC
  Administered 2020-06-05 – 2020-06-06 (×3): 650 mg via ORAL
  Filled 2020-06-05 (×3): qty 2

## 2020-06-05 MED ORDER — OXYCODONE HCL 5 MG PO TABS
5.0000 mg | ORAL_TABLET | ORAL | Status: DC | PRN
  Administered 2020-06-05 – 2020-06-06 (×3): 10 mg via ORAL
  Filled 2020-06-05 (×3): qty 2

## 2020-06-05 MED ORDER — SCOPOLAMINE 1 MG/3DAYS TD PT72
1.0000 | MEDICATED_PATCH | TRANSDERMAL | Status: DC
  Administered 2020-06-05: 1.5 mg via TRANSDERMAL
  Filled 2020-06-05: qty 1

## 2020-06-05 MED ORDER — ROCURONIUM BROMIDE 10 MG/ML (PF) SYRINGE
PREFILLED_SYRINGE | INTRAVENOUS | Status: DC | PRN
  Administered 2020-06-05: 50 mg via INTRAVENOUS
  Administered 2020-06-05: 10 mg via INTRAVENOUS
  Administered 2020-06-05 (×2): 20 mg via INTRAVENOUS

## 2020-06-05 MED ORDER — DEXAMETHASONE SODIUM PHOSPHATE 10 MG/ML IJ SOLN
INTRAMUSCULAR | Status: DC | PRN
  Administered 2020-06-05: 6 mg via INTRAVENOUS

## 2020-06-05 MED ORDER — MIDAZOLAM HCL 5 MG/5ML IJ SOLN
INTRAMUSCULAR | Status: DC | PRN
  Administered 2020-06-05: 2 mg via INTRAVENOUS

## 2020-06-05 MED ORDER — DIVALPROEX SODIUM 250 MG PO DR TAB
1000.0000 mg | DELAYED_RELEASE_TABLET | Freq: Two times a day (BID) | ORAL | Status: DC
  Administered 2020-06-05 – 2020-06-06 (×3): 1000 mg via ORAL
  Filled 2020-06-05 (×3): qty 4

## 2020-06-05 MED ORDER — FENTANYL CITRATE (PF) 100 MCG/2ML IJ SOLN
50.0000 ug | Freq: Once | INTRAMUSCULAR | Status: AC
  Administered 2020-06-05: 50 ug via INTRAVENOUS

## 2020-06-05 MED ORDER — MIDAZOLAM HCL 2 MG/2ML IJ SOLN
INTRAMUSCULAR | Status: AC
  Filled 2020-06-05: qty 2

## 2020-06-05 MED ORDER — ONDANSETRON 4 MG PO TBDP: 4.0000 mg | ORAL_TABLET | Freq: Four times a day (QID) | ORAL | Status: DC | PRN

## 2020-06-05 MED ORDER — IBUPROFEN 400 MG PO TABS: 400.0000 mg | ORAL_TABLET | ORAL | Status: DC | PRN

## 2020-06-05 MED ORDER — LACTATED RINGERS IR SOLN
Status: DC | PRN
  Administered 2020-06-05: 1000 mL

## 2020-06-05 MED ORDER — LACTATED RINGERS IV SOLN: INTRAVENOUS | Status: DC

## 2020-06-05 MED ORDER — FENTANYL CITRATE (PF) 100 MCG/2ML IJ SOLN
INTRAMUSCULAR | Status: AC
  Filled 2020-06-05: qty 2

## 2020-06-05 MED ORDER — BUPIVACAINE-EPINEPHRINE (PF) 0.5% -1:200000 IJ SOLN
INTRAMUSCULAR | Status: AC
  Filled 2020-06-05: qty 30

## 2020-06-05 MED ORDER — ROCURONIUM BROMIDE 10 MG/ML (PF) SYRINGE
PREFILLED_SYRINGE | INTRAVENOUS | Status: AC
  Filled 2020-06-05: qty 10

## 2020-06-05 MED ORDER — PHENOL 1.4 % MT LIQD
1.0000 | OROMUCOSAL | Status: DC | PRN
  Filled 2020-06-05: qty 177

## 2020-06-05 MED ORDER — ONDANSETRON HCL 4 MG/2ML IJ SOLN
INTRAMUSCULAR | Status: AC
  Filled 2020-06-05: qty 2

## 2020-06-05 MED ORDER — HYDROMORPHONE HCL 1 MG/ML IJ SOLN
1.0000 mg | INTRAMUSCULAR | Status: DC | PRN
  Administered 2020-06-05: 1 mg via INTRAVENOUS
  Filled 2020-06-05: qty 1

## 2020-06-05 MED ORDER — ONDANSETRON HCL 4 MG/2ML IJ SOLN
INTRAMUSCULAR | Status: DC | PRN
  Administered 2020-06-05 (×2): 4 mg via INTRAVENOUS

## 2020-06-05 MED ORDER — LIDOCAINE 2% (20 MG/ML) 5 ML SYRINGE
INTRAMUSCULAR | Status: DC | PRN
  Administered 2020-06-05: 80 mg via INTRAVENOUS

## 2020-06-05 MED ORDER — ONDANSETRON HCL 4 MG/2ML IJ SOLN: 4.0000 mg | Freq: Once | INTRAMUSCULAR | Status: DC | PRN

## 2020-06-05 MED ORDER — PROPOFOL 10 MG/ML IV BOLUS
INTRAVENOUS | Status: AC
  Filled 2020-06-05: qty 20

## 2020-06-05 MED ORDER — DEXAMETHASONE SODIUM PHOSPHATE 10 MG/ML IJ SOLN
INTRAMUSCULAR | Status: AC
  Filled 2020-06-05: qty 1

## 2020-06-05 MED ORDER — PROMETHAZINE HCL 25 MG/ML IJ SOLN: 12.5000 mg | Freq: Four times a day (QID) | INTRAMUSCULAR | Status: DC | PRN

## 2020-06-05 MED ORDER — OXYCODONE HCL 5 MG PO TABS: 5.0000 mg | ORAL_TABLET | Freq: Once | ORAL | Status: DC | PRN

## 2020-06-05 MED ORDER — BUPIVACAINE-EPINEPHRINE (PF) 0.5% -1:200000 IJ SOLN
INTRAMUSCULAR | Status: DC | PRN
  Administered 2020-06-05: 20 mL

## 2020-06-05 MED ORDER — LIDOCAINE 2% (20 MG/ML) 5 ML SYRINGE
INTRAMUSCULAR | Status: AC
  Filled 2020-06-05: qty 5

## 2020-06-05 MED ORDER — PANTOPRAZOLE SODIUM 40 MG IV SOLR
40.0000 mg | Freq: Every day | INTRAVENOUS | Status: DC
  Administered 2020-06-05 (×2): 40 mg via INTRAVENOUS
  Filled 2020-06-05 (×2): qty 40

## 2020-06-05 MED ORDER — SUCCINYLCHOLINE CHLORIDE 200 MG/10ML IV SOSY
PREFILLED_SYRINGE | INTRAVENOUS | Status: DC | PRN
  Administered 2020-06-05: 140 mg via INTRAVENOUS

## 2020-06-05 MED ORDER — SUGAMMADEX SODIUM 500 MG/5ML IV SOLN
INTRAVENOUS | Status: DC | PRN
  Administered 2020-06-05: 400 mg via INTRAVENOUS

## 2020-06-05 MED ORDER — KCL IN DEXTROSE-NACL 20-5-0.9 MEQ/L-%-% IV SOLN
INTRAVENOUS | Status: DC
  Filled 2020-06-05: qty 1000

## 2020-06-05 MED ORDER — MORPHINE SULFATE (PF) 2 MG/ML IV SOLN
INTRAVENOUS | Status: AC
  Administered 2020-06-05: 2 mg via INTRAVENOUS
  Filled 2020-06-05: qty 1

## 2020-06-05 MED ORDER — PROPOFOL 10 MG/ML IV BOLUS
INTRAVENOUS | Status: DC | PRN
  Administered 2020-06-05: 180 mg via INTRAVENOUS

## 2020-06-05 MED ORDER — OXYCODONE HCL 5 MG/5ML PO SOLN: 5.0000 mg | Freq: Once | ORAL | Status: DC | PRN

## 2020-06-05 MED ORDER — HYDROMORPHONE HCL 1 MG/ML IJ SOLN: 0.2500 mg | INTRAMUSCULAR | Status: DC | PRN

## 2020-06-05 MED ORDER — FENTANYL CITRATE (PF) 250 MCG/5ML IJ SOLN
INTRAMUSCULAR | Status: AC
  Filled 2020-06-05: qty 5

## 2020-06-05 MED ORDER — METHOCARBAMOL 500 MG PO TABS
500.0000 mg | ORAL_TABLET | Freq: Four times a day (QID) | ORAL | Status: DC | PRN
  Administered 2020-06-05: 500 mg via ORAL
  Filled 2020-06-05: qty 1

## 2020-06-05 MED ORDER — MORPHINE SULFATE (PF) 2 MG/ML IV SOLN
1.0000 mg | INTRAVENOUS | Status: DC | PRN
  Administered 2020-06-05: 2 mg via INTRAVENOUS
  Filled 2020-06-05 (×2): qty 1

## 2020-06-05 MED ORDER — ONDANSETRON HCL 4 MG/2ML IJ SOLN
4.0000 mg | Freq: Four times a day (QID) | INTRAMUSCULAR | Status: DC | PRN
  Administered 2020-06-05: 4 mg via INTRAVENOUS
  Filled 2020-06-05 (×2): qty 2

## 2020-06-05 MED ORDER — 0.9 % SODIUM CHLORIDE (POUR BTL) OPTIME
TOPICAL | Status: DC | PRN
  Administered 2020-06-05: 1000 mL

## 2020-06-05 SURGICAL SUPPLY — 42 items
ADH SKN CLS APL DERMABOND .7 (GAUZE/BANDAGES/DRESSINGS) ×1
APPLIER CLIP 5 13 M/L LIGAMAX5 (MISCELLANEOUS)
APPLIER CLIP ROT 10 11.4 M/L (STAPLE)
APR CLP MED LRG 11.4X10 (STAPLE)
APR CLP MED LRG 5 ANG JAW (MISCELLANEOUS)
BAG SPEC RTRVL 10 TROC 200 (ENDOMECHANICALS) ×1
CABLE HIGH FREQUENCY MONO STRZ (ELECTRODE) ×1 IMPLANT
CLIP APPLIE 5 13 M/L LIGAMAX5 (MISCELLANEOUS) IMPLANT
CLIP APPLIE ROT 10 11.4 M/L (STAPLE) IMPLANT
CLIP VESOLOCK XL 6/CT (CLIP) ×2 IMPLANT
COVER SURGICAL LIGHT HANDLE (MISCELLANEOUS) ×2 IMPLANT
COVER WAND RF STERILE (DRAPES) IMPLANT
DECANTER SPIKE VIAL GLASS SM (MISCELLANEOUS) ×2 IMPLANT
DERMABOND ADVANCED (GAUZE/BANDAGES/DRESSINGS) ×1
DERMABOND ADVANCED .7 DNX12 (GAUZE/BANDAGES/DRESSINGS) IMPLANT
DRAIN CHANNEL 19F RND (DRAIN) IMPLANT
DRAPE LAPAROSCOPIC ABDOMINAL (DRAPES) ×2 IMPLANT
ELECT REM PT RETURN 15FT ADLT (MISCELLANEOUS) ×2 IMPLANT
ENDOLOOP SUT PDS II  0 18 (SUTURE)
ENDOLOOP SUT PDS II 0 18 (SUTURE) IMPLANT
EVACUATOR SILICONE 100CC (DRAIN) IMPLANT
GLOVE BIOGEL PI IND STRL 7.0 (GLOVE) ×1 IMPLANT
GLOVE BIOGEL PI INDICATOR 7.0 (GLOVE) ×1
GLOVE SURG SS PI 7.0 STRL IVOR (GLOVE) ×2 IMPLANT
GOWN STRL REUS W/TWL LRG LVL3 (GOWN DISPOSABLE) ×3 IMPLANT
GRASPER SUT TROCAR 14GX15 (MISCELLANEOUS) IMPLANT
KIT BASIN OR (CUSTOM PROCEDURE TRAY) ×2 IMPLANT
KIT TURNOVER KIT A (KITS) ×1 IMPLANT
PENCIL SMOKE EVACUATOR (MISCELLANEOUS) IMPLANT
POUCH RETRIEVAL ECOSAC 10 (ENDOMECHANICALS) ×1 IMPLANT
POUCH RETRIEVAL ECOSAC 10MM (ENDOMECHANICALS) ×2
SCISSORS LAP 5X35 DISP (ENDOMECHANICALS) ×2 IMPLANT
SET IRRIG TUBING LAPAROSCOPIC (IRRIGATION / IRRIGATOR) ×2 IMPLANT
SET TUBE SMOKE EVAC HIGH FLOW (TUBING) ×2 IMPLANT
SLEEVE XCEL OPT CAN 5 100 (ENDOMECHANICALS) ×2 IMPLANT
SUT ETHILON 2 0 PS N (SUTURE) IMPLANT
SUT MNCRL AB 4-0 PS2 18 (SUTURE) ×2 IMPLANT
TOWEL OR 17X26 10 PK STRL BLUE (TOWEL DISPOSABLE) ×2 IMPLANT
TOWEL OR NON WOVEN STRL DISP B (DISPOSABLE) ×2 IMPLANT
TRAY LAPAROSCOPIC (CUSTOM PROCEDURE TRAY) ×2 IMPLANT
TROCAR BLADELESS OPT 5 100 (ENDOMECHANICALS) ×2 IMPLANT
TROCAR XCEL 12X100 BLDLESS (ENDOMECHANICALS) ×2 IMPLANT

## 2020-06-05 NOTE — Op Note (Signed)
Preoperative diagnosis: acute appendicitis  Postoperative diagnosis: Same   Procedure: laparoscopic appendectomy  Surgeon: Gurney Maxin, M.D.  Asst: none  Anesthesia: Gen.   Indications for procedure: Abigail Hall is a 39 y.o. female with symptoms of vague pain  consistent with acute appendicitis. Confirmed by CT scan.  Description of procedure: The patient was brought into the operative suite, placed supine. Anesthesia was administered with endotracheal tube. The patient's left arm was tucked. All pressure points were offloaded by foam padding. The patient was prepped and draped in the usual sterile fashion.  A transverse incision was made to the left subcostal and a 43mm trocar was used to gain access to the peritoneum with optical entry. Pneumoperitoneum was applied with high flow low pressure.  1 77mm trocar were placed in the LLQ, 1 12 mm trocar was placed in the epigastric area. The appendix was inspected and showed minimal inflammation. There was yellow clear fluid in the pelvis and right paracolic gutter. The intestine was run in retrograde fashion. No pathology was identified, one area was had signs of mild inflammation. The colon was inspected and normal. The gallbladder and stomach were inspected and appeared normal. The ovaries were inspected and appeared scarred in and somewhat cystic. Next, the patient was placed in trendelenberg, rotated to the left. The omentum was retracted cephalad. The base of the appendix was dissected and a window through the mesoappendix was created with blunt dissection. Large Hem-o-lock clips were used to doubly ligate the base of the appendix and mesoappendix. The appendix was cut free with scissors.  The appendix was placed in a specimen bag. The pelvis and RLQ were irrigated. The appendix was removed via the . 0 vicryl was used to close the fascial defect. Pneumoperitoneum was removed, all trocars were removed. All incisions were closed with 4-0  monocryl subcuticular stitch. The patient woke from anesthesia and was brought to PACU in stable condition.  Findings: mildly inflamed appendix  Specimen: appendix  Blood loss: 20 ml  Local anesthesia: 20 ml marcaine  Complications: none  Images:     Gurney Maxin, M.D. General, Bariatric, & Minimally Invasive Surgery [facility]

## 2020-06-05 NOTE — Transfer of Care (Signed)
Immediate Anesthesia Transfer of Care Note  Patient: Abigail Hall  Procedure(s) Performed: APPENDECTOMY LAPAROSCOPIC (N/A Abdomen)  Patient Location: PACU  Anesthesia Type:General  Level of Consciousness: awake, alert , oriented and patient cooperative  Airway & Oxygen Therapy: Patient Spontanous Breathing and Patient connected to face mask oxygen  Post-op Assessment: Report given to RN and Post -op Vital signs reviewed and stable  Post vital signs: Reviewed and stable  Last Vitals:  Vitals Value Taken Time  BP 131/70 06/05/20 1320  Temp    Pulse 68 06/05/20 1326  Resp 16 06/05/20 1326  SpO2 100 % 06/05/20 1326  Vitals shown include unvalidated device data.  Last Pain:  Vitals:   06/05/20 1150  TempSrc:   PainSc: 5          Complications: No complications documented.

## 2020-06-05 NOTE — Anesthesia Postprocedure Evaluation (Signed)
Anesthesia Post Note  Patient: Warden/ranger  Procedure(s) Performed: APPENDECTOMY LAPAROSCOPIC (N/A Abdomen)     Patient location during evaluation: PACU Anesthesia Type: General Level of consciousness: awake and alert and oriented Pain management: pain level controlled Vital Signs Assessment: post-procedure vital signs reviewed and stable Respiratory status: spontaneous breathing, nonlabored ventilation and respiratory function stable Cardiovascular status: blood pressure returned to baseline and stable Postop Assessment: no apparent nausea or vomiting Anesthetic complications: no   No complications documented.  Last Vitals:  Vitals:   06/05/20 1002 06/05/20 1118  BP: 116/73 127/88  Pulse: 65 60  Resp: 20 18  Temp: 36.5 C 37.4 C  SpO2: 100% 100%    Last Pain:  Vitals:   06/05/20 1150  TempSrc:   PainSc: 5                  Enrico Eaddy A.

## 2020-06-05 NOTE — Progress Notes (Signed)
[facility] Progress Note     Subjective: CC-  No better. Continues to have diffuse abdominal pain, pain is most severe in the RLQ. She vomited once this morning and still feels nauseated. WBC 6.1, afebrile  Objective: Vital signs in last 24 hours: Temp:  [98.1 F (36.7 C)-98.7 F (37.1 C)] 98.1 F (36.7 C) (10/21 0553) Pulse Rate:  [51-83] 53 (10/21 0553) Resp:  [12-24] 20 (10/21 0553) BP: (90-126)/(49-81) 119/66 (10/21 0553) SpO2:  [99 %-100 %] 100 % (10/21 0553) Weight:  [91.6 kg] 91.6 kg (10/20 1233) Last BM Date: 06/04/20  Intake/Output from previous day: 10/20 0701 - 10/21 0700 In: 1395.3 [I.V.:1050; IV Piggyback:345.3] Out: 0  Intake/Output this shift: No intake/output data recorded.  PE: Gen:  Alert, NAD, pleasant Card:  RRR Pulm:  CTAB, no W/R/R, rate and effort normal Abd: Soft, ND, mild diffuse TTP/ most significant pain is in the RLQ with voluntary  guarding, +BS, no HSM, no hernia Psych: A&Ox4  Skin: no rashes noted, warm and dry  Lab Results:  Recent Labs    06/04/20 1335 06/05/20 0619  WBC 6.9 6.1  HGB 8.6* 7.9*  HCT 28.2* 26.7*  PLT 259 250   BMET Recent Labs    06/04/20 1335 06/05/20 0619  NA 139 138  K 4.1 3.4*  CL 104 106  CO2 24 24  GLUCOSE 90 105*  BUN 15 12  CREATININE 0.57 0.62  CALCIUM 8.7* 8.3*   PT/INR No results for input(s): LABPROT, INR in the last 72 hours. CMP     Component Value Date/Time   NA 138 06/05/2020 0619   NA 145 08/04/2017 0829   K 3.4 (L) 06/05/2020 0619   K 4.0 08/04/2017 0829   CL 106 06/05/2020 0619   CL 108 08/04/2017 0829   CO2 24 06/05/2020 0619   CO2 26 08/04/2017 0829   GLUCOSE 105 (H) 06/05/2020 0619   GLUCOSE 118 08/04/2017 0829   BUN 12 06/05/2020 0619   BUN 11 08/04/2017 0829   CREATININE 0.62 06/05/2020 0619   CREATININE 0.69 05/19/2020 0814   CREATININE 0.8 08/04/2017 0829   CALCIUM 8.3 (L) 06/05/2020 0619   CALCIUM 9.4 08/04/2017 0829   PROT 6.5 06/04/2020 1335    PROT 6.6 08/04/2017 0829   ALBUMIN 3.6 06/04/2020 1335   ALBUMIN 3.4 08/04/2017 0829   AST 11 (L) 06/04/2020 1335   AST 9 (L) 05/19/2020 0814   ALT 9 06/04/2020 1335   ALT 8 05/19/2020 0814   ALT 14 08/04/2017 0829   ALKPHOS 45 06/04/2020 1335   ALKPHOS 63 08/04/2017 0829   BILITOT 0.6 06/04/2020 1335   BILITOT 0.6 05/19/2020 0814   GFRNONAA >60 06/05/2020 0619   GFRNONAA >60 05/19/2020 0814   GFRAA >60 05/19/2020 0814   Lipase     Component Value Date/Time   LIPASE 48 06/04/2020 1335       Studies/Results: CT ABDOMEN PELVIS W CONTRAST  Result Date: 06/04/2020 CLINICAL DATA:  Right lower quadrant pain. EXAM: CT ABDOMEN AND PELVIS WITH CONTRAST TECHNIQUE: Multidetector CT imaging of the abdomen and pelvis was performed using the standard protocol following bolus administration of intravenous contrast. CONTRAST:  141mL OMNIPAQUE IOHEXOL 300 MG/ML  SOLN COMPARISON:  February 27, 2013 FINDINGS: Lower chest: No acute abnormality. Hepatobiliary: No focal liver abnormality is seen. No gallstones, gallbladder wall thickening, or biliary dilatation. Pancreas: Unremarkable. No pancreatic ductal dilatation or surrounding inflammatory changes. Spleen: Normal in size without focal abnormality. Adrenals/Urinary Tract: Adrenal glands are  unremarkable. Kidneys are normal, without renal calculi, focal lesion, or hydronephrosis. The urinary bladder is contracted and subsequently limited in evaluation. Mild diffuse urinary bladder wall thickening is noted. Stomach/Bowel: There is a small hiatal hernia. Multiple surgical sutures and surgical clips are seen within the gastric region. A very mild amount of inflammatory fat stranding is seen surrounding the base of the appendix (axial CT images 43 and 44, CT series number 2). The appendix is otherwise normal in appearance (axial CT images 43 through 50, CT series number 2/coronal reformatted images 47 through 51, CT series number 8). No evidence of bowel  dilatation. Vascular/Lymphatic: No significant vascular findings are present. No enlarged abdominal or pelvic lymph nodes. Reproductive: Status post hysterectomy. Ill-defined subcentimeter cysts are seen involving the bilateral adnexa (axial CT images 60 through 66, CT series number 2). Other: No abdominal wall hernia or abnormality. No abdominopelvic ascites. Musculoskeletal: No acute or significant osseous findings. IMPRESSION: 1. Very mild inflammatory fat stranding surrounding the base of the appendix which may represent early/mild appendicitis. 2. Mild diffuse urinary bladder wall thickening, which may be, in part, secondary to the partially contracted state of the urinary bladder. Correlation with urinalysis is recommended to exclude the presence of an underlying cystitis. 3. Small hiatal hernia with evidence of prior gastric surgery. 4. Ill-defined subcentimeter cysts involving the bilateral adnexa, likely ovarian in origin. Correlation with pelvic ultrasound is recommended. Electronically Signed   By: Virgina Norfolk M.D.   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 I think she may need diagnostic laparoscopy today. Check type and scree and iron panel. Will review with MD. Continue IV antibiotics. Keep NPO.  ID -  cipro/flagyl 10/20>>> FEN - IVF, NPO VTE - SCDs only for now Foley - none Follow up - TBD    LOS: 0 days    Wellington Hampshire, [facility] 06/05/2020, 8:39 AM Please see Amion for pager number during day hours 7:00am-4:30pm

## 2020-06-05 NOTE — Discharge Instructions (Signed)
[facility] ° °Please arrive at least 30 min before your appointment to complete your check in paperwork.  If you are unable to arrive 30 min prior to your appointment time we may have to cancel or reschedule you. °LAPAROSCOPIC SURGERY: POST OP INSTRUCTIONS °Always review your discharge instruction sheet given to you by the facility where your surgery was performed. °IF YOU HAVE DISABILITY OR FAMILY LEAVE FORMS, YOU MUST BRING THEM TO THE OFFICE FOR PROCESSING.   °DO NOT GIVE THEM TO YOUR DOCTOR. ° °PAIN CONTROL ° °1. First take acetaminophen (Tylenol) AND/or ibuprofen (Advil) to control your pain after surgery.  Follow directions on package.  Taking acetaminophen (Tylenol) and/or ibuprofen (Advil) regularly after surgery will help to control your pain and lower the amount of prescription pain medication you may need.  You should not take more than 4,000 mg (4 grams) of acetaminophen (Tylenol) in 24 hours.  You should not take ibuprofen (Advil), aleve, motrin, naprosyn or other NSAIDS if you have a history of stomach ulcers or chronic kidney disease.  °2. A prescription for pain medication may be given to you upon discharge.  Take your pain medication as prescribed, if you still have uncontrolled pain after taking acetaminophen (Tylenol) or ibuprofen (Advil). °3. Use ice packs to help control pain. °4. If you need a refill on your pain medication, please contact your pharmacy.  They will contact our office to request authorization. Prescriptions will not be filled after 5pm or on week-ends. ° °HOME MEDICATIONS °5. Take your usually prescribed medications unless otherwise directed. ° °DIET °6. You should follow a light diet the first few days after arrival home.  Be sure to include lots of fluids daily. Avoid fatty, fried foods.  ° °CONSTIPATION °7. It is common to experience some constipation after surgery and if you are taking pain medication.  Increasing fluid intake and taking a stool  softener (such as Colace) will usually help or prevent this problem from occurring.  A mild laxative (Milk of Magnesia or Miralax) should be taken according to package instructions if there are no bowel movements after 48 hours. ° °WOUND/INCISION CARE °8. Most patients will experience some swelling and bruising in the area of the incisions.  Ice packs will help.  Swelling and bruising can take several days to resolve.  °9. Unless discharge instructions indicate otherwise, follow guidelines below  °a. STERI-STRIPS - you may remove your outer bandages 48 hours after surgery, and you may shower at that time.  You have steri-strips (small skin tapes) in place directly over the incision.  These strips should be left on the skin for 7-10 days.   °b. DERMABOND/SKIN GLUE - you may shower in 24 hours.  The glue will flake off over the next 2-3 weeks. °10. Any sutures or staples will be removed at the office during your follow-up visit. ° °ACTIVITIES °11. You may resume regular (light) daily activities beginning the next day--such as daily self-care, walking, climbing stairs--gradually increasing activities as tolerated.  You may have sexual intercourse when it is comfortable.  Refrain from any heavy lifting or straining until approved by your doctor. °a. You may drive when you are no longer taking prescription pain medication, you can comfortably wear a seatbelt, and you can safely maneuver your car and apply brakes. ° °FOLLOW-UP °12. You should see your doctor in the office for a follow-up appointment approximately 2-3 weeks after your surgery.  You should have been given your post-op/follow-up appointment when   your surgery was scheduled.  If you did not receive a post-op/follow-up appointment, make sure that you call for this appointment within a day or two after you arrive home to insure a convenient appointment time. ° °OTHER INSTRUCTIONS ° °WHEN TO CALL YOUR DOCTOR: °1. Fever over 101.0 °2. Inability to  urinate °3. Continued bleeding from incision. °4. Increased pain, redness, or drainage from the incision. °5. Increasing abdominal pain ° °The clinic staff is available to answer your questions during regular business hours.  Please don’t hesitate to call and ask to speak to one of the nurses for clinical concerns.  If you have a medical emergency, go to the nearest emergency room or call 911.  A surgeon from [facility] is always on call at the hospital. °[address], [address], [place], [place]  [address] ? [address] [address], [place], [place]   [address] °[phone] ? [phone] ? FAX [phone] ° ° ° °

## 2020-06-05 NOTE — Anesthesia Preprocedure Evaluation (Addendum)
Anesthesia Evaluation  Patient identified by MRN, date of birth, ID band Patient awake    Reviewed: Allergy & Precautions, NPO status , Patient's Chart, lab work & pertinent test results  Airway Mallampati: II  TM Distance: >3 FB Neck ROM: Full    Dental no notable dental hx. (+) Teeth Intact   Pulmonary neg pulmonary ROS,    Pulmonary exam normal breath sounds clear to auscultation       Cardiovascular negative cardio ROS Normal cardiovascular exam Rhythm:Regular Rate:Normal     Neuro/Psych  Headaches, Seizures -, Well Controlled,  PSYCHIATRIC DISORDERS Depression  Neuromuscular disease    GI/Hepatic Acute appendicitis   Endo/Other  Obesity  Renal/GU   negative genitourinary   Musculoskeletal negative musculoskeletal ROS (+)   Abdominal (+) + obese,   Peds  Hematology  (+) anemia , Alpha thalassemia minor   Anesthesia Other Findings   Reproductive/Obstetrics                            Anesthesia Physical Anesthesia Plan  ASA: II  Anesthesia Plan: General   Post-op Pain Management:    Induction: Intravenous, Rapid sequence and Cricoid pressure planned  PONV Risk Score and Plan: 4 or greater and Scopolamine patch - Pre-op, Midazolam, Ondansetron, Dexamethasone and Treatment may vary due to age or medical condition  Airway Management Planned: Oral ETT  Additional Equipment:   Intra-op Plan:   Post-operative Plan: Extubation in OR  Informed Consent: I have reviewed the patients History and Physical, chart, labs and discussed the procedure including the risks, benefits and alternatives for the proposed anesthesia with the patient or authorized representative who has indicated his/her understanding and acceptance.     Dental advisory given  Plan Discussed with: CRNA and Anesthesiologist  Anesthesia Plan Comments:         Anesthesia Quick Evaluation

## 2020-06-05 NOTE — Anesthesia Procedure Notes (Signed)
Procedure Name: Intubation Date/Time: 06/05/2020 12:21 PM Performed by: West Pugh, CRNA Pre-anesthesia Checklist: Patient identified, Emergency Drugs available, Suction available, Patient being monitored and Timeout performed Patient Re-evaluated:Patient Re-evaluated prior to induction Oxygen Delivery Method: Circle system utilized Preoxygenation: Pre-oxygenation with 100% oxygen Induction Type: IV induction and Cricoid Pressure applied Ventilation: Mask ventilation without difficulty Laryngoscope Size: Mac and 3 Grade View: Grade I Tube type: Oral Tube size: 7.0 mm Number of attempts: 1 Airway Equipment and Method: Stylet Placement Confirmation: ETT inserted through vocal cords under direct vision,  positive ETCO2,  CO2 detector and breath sounds checked- equal and bilateral Secured at: 21 cm Tube secured with: Tape Dental Injury: Teeth and Oropharynx as per pre-operative assessment

## 2020-06-06 ENCOUNTER — Encounter (HOSPITAL_COMMUNITY): Payer: Self-pay | Admitting: General Surgery

## 2020-06-06 LAB — CBC
HCT: 26 % — ABNORMAL LOW (ref 36.0–46.0)
Hemoglobin: 7.7 g/dL — ABNORMAL LOW (ref 12.0–15.0)
MCH: 18.4 pg — ABNORMAL LOW (ref 26.0–34.0)
MCHC: 29.6 g/dL — ABNORMAL LOW (ref 30.0–36.0)
MCV: 62.1 fL — ABNORMAL LOW (ref 80.0–100.0)
Platelets: 252 10*3/uL (ref 150–400)
RBC: 4.19 MIL/uL (ref 3.87–5.11)
RDW: 18 % — ABNORMAL HIGH (ref 11.5–15.5)
WBC: 9.6 10*3/uL (ref 4.0–10.5)
nRBC: 0.6 % — ABNORMAL HIGH (ref 0.0–0.2)

## 2020-06-06 LAB — BASIC METABOLIC PANEL
Anion gap: 9 (ref 5–15)
BUN: 9 mg/dL (ref 6–20)
CO2: 24 mmol/L (ref 22–32)
Calcium: 9 mg/dL (ref 8.9–10.3)
Chloride: 105 mmol/L (ref 98–111)
Creatinine, Ser: 0.67 mg/dL (ref 0.44–1.00)
GFR, Estimated: >60 mL/min (ref >60)
Glucose, Bld: 164 mg/dL — ABNORMAL HIGH (ref 70–99)
Potassium: 4.3 mmol/L (ref 3.5–5.1)
Sodium: 138 mmol/L (ref 135–145)

## 2020-06-06 LAB — MAGNESIUM: Magnesium: 2 mg/dL (ref 1.7–2.4)

## 2020-06-06 MED ORDER — POLYETHYLENE GLYCOL 3350 17 G PO PACK
17.0000 g | PACK | Freq: Every day | ORAL | Status: DC
  Administered 2020-06-06: 17 g via ORAL
  Filled 2020-06-06: qty 1

## 2020-06-06 MED ORDER — ACETAMINOPHEN 325 MG PO TABS: 650.0000 mg | ORAL_TABLET | Freq: Four times a day (QID) | ORAL | Status: DC | PRN

## 2020-06-06 MED ORDER — SODIUM CHLORIDE 0.9 % IV SOLN
510.0000 mg | Freq: Once | INTRAVENOUS | Status: AC
  Administered 2020-06-06: 510 mg via INTRAVENOUS
  Filled 2020-06-06: qty 510

## 2020-06-06 MED ORDER — PROMETHAZINE HCL 12.5 MG PO TABS: 12.5000 mg | ORAL_TABLET | Freq: Four times a day (QID) | ORAL | 0 refills | Status: DC | PRN

## 2020-06-06 MED ORDER — OXYCODONE HCL 5 MG PO TABS: 5.0000 mg | ORAL_TABLET | Freq: Four times a day (QID) | ORAL | 0 refills | Status: DC | PRN

## 2020-06-06 NOTE — Progress Notes (Signed)
Discharge instructions discussed with patient, verbalized understanding,

## 2020-06-06 NOTE — Discharge Summary (Signed)
[facility] Discharge Summary   Patient ID: Abigail Hall MRN: [id number] DOB/AGE: [birth year] 39 y.o.  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 EXAM: CT ABDOMEN AND PELVIS WITH CONTRAST TECHNIQUE: Multidetector CT imaging of the abdomen and pelvis was performed using the standard protocol following bolus administration of intravenous contrast. CONTRAST:  156mL OMNIPAQUE IOHEXOL 300 MG/ML  SOLN COMPARISON:  February 27, 2013 FINDINGS: Lower chest: No acute abnormality. Hepatobiliary: No focal liver abnormality is seen. No gallstones, gallbladder wall thickening, or biliary dilatation. Pancreas: Unremarkable. No pancreatic ductal dilatation or surrounding inflammatory changes. Spleen: Normal in size without focal abnormality. Adrenals/Urinary Tract: Adrenal glands are unremarkable. Kidneys are normal, without renal calculi, focal lesion, or hydronephrosis. The urinary bladder is contracted and subsequently limited in evaluation. Mild diffuse urinary bladder wall thickening is noted.  Stomach/Bowel: There is a small hiatal hernia. Multiple surgical sutures and surgical clips are seen within the gastric region. A very mild amount of inflammatory fat stranding is seen surrounding the base of the appendix (axial CT images 43 and 44, CT series number 2). The appendix is otherwise normal in appearance (axial CT images 43 through 50, CT series number 2/coronal reformatted images 47 through 51, CT series number 8). No evidence of bowel dilatation. Vascular/Lymphatic: No significant vascular findings are present. No enlarged abdominal or pelvic lymph nodes. Reproductive: Status post hysterectomy. Ill-defined subcentimeter cysts are seen involving the bilateral adnexa (axial CT images 60 through 66, CT series number 2). Other: No abdominal wall hernia or abnormality. No abdominopelvic ascites. Musculoskeletal: No acute or significant osseous findings. IMPRESSION: 1. Very mild inflammatory fat stranding surrounding the base of the appendix which may represent early/mild appendicitis. 2. Mild diffuse urinary bladder wall thickening, which may be, in part, secondary to the partially contracted state of the urinary bladder. Correlation with urinalysis is recommended to exclude the presence of an underlying cystitis. 3. Small hiatal hernia with evidence of prior gastric surgery. 4. Ill-defined subcentimeter cysts involving the bilateral adnexa, likely ovarian in origin. Correlation with pelvic ultrasound is recommended. Electronically Signed   By: Virgina Norfolk M.D.   On: 06/04/2020 15:20    Procedures Dr. Kieth Brightly (06/05/2020) - Laparoscopic Appendectomy  Hospital Course:  Abigail Hall is a 39yo female history of prior gastric sleeve 2018, thalassemia minor, and IDA who was transferred from [facility] to [facility] for evaluation by general surgery for possible appendicitis. She presented with 2 weeks of worsening abdominal pain, nausea, vomiting. Workup included CT scan which showed some inflammatory  change near the appendix.  WBC WNL. Symptoms atypical for appendicitis and with a normal WBC, patient was admitted to the hospital for IV antibiotics and observation. Symptoms did not improve  therefore she was taken to the operating room for diagnostic laparoscopy. Intraoperatively the appendix was inspected and showed minimal inflammation, there was yellow clear fluid in the pelvis and right paracolic gutter. Appendectomy performed. Tolerated procedure well and was transferred to the floor.  Diet was advanced as tolerated. Symptoms improved postoperatively. Patient received an iron transfusion prior to discharge for her chronic iron deficiency anemia. On POD1 the patient was voiding well, tolerating diet, ambulating well, pain well controlled, vital signs stable, incisions c/d/i and felt stable for discharge home.  Patient will follow up as below and knows to call with questions or concerns.    I have personally reviewed the patients medication history on the [place] controlled substance database.    Physical Exam: General:  Alert, NAD, pleasant, comfortable Pulm: rate and effort normal Abd:  Soft, ND, appropriately tender, multiple lap incisions C/D/I, +BS  Allergies as of 06/06/2020      Reactions   Cephalexin Hives   Latex Itching, Swelling, Rash      Medication List    TAKE these medications   acetaminophen 325 MG tablet Commonly known as: TYLENOL Take 2 tablets (650 mg total) by mouth every 6 (six) hours as needed for mild pain.   ALPRAZolam 1 MG tablet Commonly known as: XANAX Take 1 mg by mouth 3 (three) times daily as needed for anxiety.   Biotin 10000 MCG Tbdp Take 10,000 mcg by mouth daily.   divalproex 500 MG DR tablet Commonly known as: DEPAKOTE Take 2 tablets (1,000 mg total) by mouth 2 (two) times daily. What changed: when to take this   ELDERBERRY PO Take 1 tablet by mouth daily.   Fluocinolone Acetonide Scalp 0.01 % Oil APPLY EXTERNALLY TO THE AFFECTED AREA DAILY AS  NEEDED What changed: See the new instructions.   fluticasone 50 MCG/ACT nasal spray Commonly known as: FLONASE Place 2 sprays daily into both nostrils. What changed:   when to take this  reasons to take this   ketoconazole 2 % shampoo Commonly known as: NIZORAL APPLY EXTERNALLY 2 TIMES A WEEK What changed: See the new instructions.   multivitamin with minerals Tabs tablet Take 1 tablet by mouth daily.   oxyCODONE 5 MG immediate release tablet Commonly known as: Oxy IR/ROXICODONE Take 1 tablet (5 mg total) by mouth every 6 (six) hours as needed for severe pain.   polyethylene glycol powder 17 GM/SCOOP powder Commonly known as: GLYCOLAX/MIRALAX Take 1 Container by mouth daily as needed for mild constipation. 1 scoop as needed   promethazine 12.5 MG tablet Commonly known as: PHENERGAN Take 1 tablet (12.5 mg total) by mouth every 6 (six) hours as needed for nausea or vomiting.   vitamin C 500 MG tablet Commonly known as: ASCORBIC ACID Take 500 mg by mouth daily.   vitamin E 1000 UNIT capsule Generic drug: vitamin E Take 1,000 Units by mouth daily.         Follow-up [facility], [place]. Call.   Specialty: General Surgery Why: We are working on your appointment, call to confirm Please arrive 30 minutes prior to your appointment to check in and fill out paperwork. Bring photo ID and insurance information. Contact information: [address] [place] [phone]       Servando Salina, MD. Call.   Specialty: Obstetrics and Gynecology Why: Call your OB/GYN to arrange follow up to discuss cysts seen on ovaries Contact information: [address]  [place] [address] [phone]        Carol Ada, MD. Call.   Specialty: Gastroenterology Why: regarding follow up for hemorrhoids, our office will work on sending referral Contact information: [place],  [place] [address] [phone]        [name], MD. Call.   Specialty: Oncology Contact information: [address] [place] [address] [phone]               Signed: Wellington Hampshire, [facility] 06/06/2020, 8:37 AM Please see Amion for pager number during day hours 7:00am-4:30pm

## 2020-06-09 LAB — SURGICAL PATHOLOGY

## 2020-06-30 ENCOUNTER — Other Ambulatory Visit: Payer: Self-pay

## 2020-06-30 ENCOUNTER — Inpatient Hospital Stay: Payer: BC Managed Care – PPO | Attending: Family | Admitting: Hematology & Oncology

## 2020-06-30 ENCOUNTER — Encounter: Payer: Self-pay | Admitting: Hematology & Oncology

## 2020-06-30 ENCOUNTER — Inpatient Hospital Stay: Payer: BC Managed Care – PPO

## 2020-06-30 ENCOUNTER — Telehealth: Payer: Self-pay

## 2020-06-30 VITALS — BP 113/56 | HR 79 | Temp 98.6°F | Resp 16 | Wt 202.0 lb

## 2020-06-30 DIAGNOSIS — D563 Thalassemia minor: Secondary | ICD-10-CM

## 2020-06-30 DIAGNOSIS — K909 Intestinal malabsorption, unspecified: Secondary | ICD-10-CM | POA: Diagnosis not present

## 2020-06-30 DIAGNOSIS — R109 Unspecified abdominal pain: Secondary | ICD-10-CM | POA: Diagnosis not present

## 2020-06-30 DIAGNOSIS — Z9884 Bariatric surgery status: Secondary | ICD-10-CM | POA: Diagnosis not present

## 2020-06-30 DIAGNOSIS — D500 Iron deficiency anemia secondary to blood loss (chronic): Secondary | ICD-10-CM

## 2020-06-30 DIAGNOSIS — D509 Iron deficiency anemia, unspecified: Secondary | ICD-10-CM | POA: Insufficient documentation

## 2020-06-30 DIAGNOSIS — Z8719 Personal history of other diseases of the digestive system: Secondary | ICD-10-CM | POA: Diagnosis not present

## 2020-06-30 LAB — CMP (CANCER CENTER ONLY)
ALT: 10 U/L (ref 0–44)
AST: 10 U/L — ABNORMAL LOW (ref 15–41)
Albumin: 4.1 g/dL (ref 3.5–5.0)
Alkaline Phosphatase: 53 U/L (ref 38–126)
Anion gap: 6 (ref 5–15)
BUN: 13 mg/dL (ref 6–20)
CO2: 30 mmol/L (ref 22–32)
Calcium: 9.6 mg/dL (ref 8.9–10.3)
Chloride: 107 mmol/L (ref 98–111)
Creatinine: 0.7 mg/dL (ref 0.44–1.00)
GFR, Estimated: >60 mL/min (ref >60)
Glucose, Bld: 121 mg/dL — ABNORMAL HIGH (ref 70–99)
Potassium: 4.1 mmol/L (ref 3.5–5.1)
Sodium: 143 mmol/L (ref 135–145)
Total Bilirubin: 0.5 mg/dL (ref 0.3–1.2)
Total Protein: 6.5 g/dL (ref 6.5–8.1)

## 2020-06-30 LAB — IRON AND TIBC
Iron: 124 ug/dL (ref 41–142)
Saturation Ratios: 50 % (ref 21–57)
TIBC: 248 ug/dL (ref 236–444)
UIBC: 124 ug/dL (ref 120–384)

## 2020-06-30 LAB — CBC WITH DIFFERENTIAL (CANCER CENTER ONLY)
Abs Immature Granulocytes: 0.04 10*3/uL (ref 0.00–0.07)
Basophils Absolute: 0 10*3/uL (ref 0.0–0.1)
Basophils Relative: 0 %
Eosinophils Absolute: 0.2 10*3/uL (ref 0.0–0.5)
Eosinophils Relative: 3 %
HCT: 31.1 % — ABNORMAL LOW (ref 36.0–46.0)
Hemoglobin: 9.1 g/dL — ABNORMAL LOW (ref 12.0–15.0)
Immature Granulocytes: 1 %
Lymphocytes Relative: 34 %
Lymphs Abs: 2.4 10*3/uL (ref 0.7–4.0)
MCH: 18.3 pg — ABNORMAL LOW (ref 26.0–34.0)
MCHC: 29.3 g/dL — ABNORMAL LOW (ref 30.0–36.0)
MCV: 62.4 fL — ABNORMAL LOW (ref 80.0–100.0)
Monocytes Absolute: 0.4 10*3/uL (ref 0.1–1.0)
Monocytes Relative: 6 %
Neutro Abs: 3.9 10*3/uL (ref 1.7–7.7)
Neutrophils Relative %: 56 %
Platelet Count: 301 10*3/uL (ref 150–400)
RBC: 4.98 MIL/uL (ref 3.87–5.11)
RDW: 17.1 % — ABNORMAL HIGH (ref 11.5–15.5)
WBC Count: 6.9 10*3/uL (ref 4.0–10.5)
nRBC: 0.3 % — ABNORMAL HIGH (ref 0.0–0.2)

## 2020-06-30 LAB — FERRITIN: Ferritin: 2181 ng/mL — ABNORMAL HIGH (ref 11–307)

## 2020-06-30 LAB — RETICULOCYTES
Immature Retic Fract: 15.9 % (ref 2.3–15.9)
RBC.: 4.95 MIL/uL (ref 3.87–5.11)
Retic Count, Absolute: 147 10*3/uL (ref 19.0–186.0)
Retic Ct Pct: 3 % (ref 0.4–3.1)

## 2020-06-30 LAB — SAVE SMEAR(SSMR), FOR PROVIDER SLIDE REVIEW

## 2020-06-30 MED ORDER — ZOLPIDEM TARTRATE 5 MG PO TABS: 5.0000 mg | ORAL_TABLET | Freq: Every evening | ORAL | 0 refills | Status: DC | PRN

## 2020-06-30 NOTE — Progress Notes (Signed)
Hematology and Oncology Follow Up Visit  Abigail Hall [id number] November 21, 1980 39 y.o. 06/30/2020   Principle Diagnosis:  Thalassemia minor (beta thalassemia) History of iron deficiency anemia  Current Therapy:   Folic acid 2 mg by mouth daily- patient stopped herself IV iron as indicated - feraheme given on 10/15/2019   Interim History:  Abigail Hall is here today for follow-up.  The big news was that she had an appendectomy in October.  She has abdominal pain.  She was found to have appendicitis.  She had a CT scan done.  She had a laparoscopic appendectomy.  This was done on 06/05/2020.  She did well with this.  The pathology report shows no evidence of malignancy.  She got some iron when she was in the hospital.  She was not transfused.  She is doing okay right now.  The cold weather is not bothering her all that much.  She has had no problems with cough or shortness of breath.  She has had no leg swelling.  She does have some bright red blood per rectum.  She has had hemorrhoid issues.  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  She is doing well with this.  She has had no rashes.  Overall, her performance status is ECOG 1.    Medications:  Allergies as of 06/30/2020      Reactions   Cephalexin Hives   Latex Itching, Swelling, Rash      Medication List       Accurate as of June 30, 2020  8:57 AM. If you have any questions, ask your nurse or doctor.        acetaminophen 325 MG tablet Commonly known as: TYLENOL Take 2 tablets (650 mg total) by mouth every 6 (six) hours as needed for mild pain.   ALPRAZolam 1 MG tablet Commonly known as: XANAX Take 1 mg by mouth 3 (three) times daily as needed for anxiety.   Biotin 10000 MCG Tbdp Take 10,000 mcg by mouth daily.   divalproex 500 MG DR tablet Commonly known as: DEPAKOTE Take 2 tablets (1,000 mg total) by mouth 2 (two) times daily. What changed: when to take this   ELDERBERRY PO Take 1 tablet by mouth  daily.   Fluocinolone Acetonide Scalp 0.01 % Oil APPLY EXTERNALLY TO THE AFFECTED AREA DAILY AS NEEDED What changed: See the new instructions.   fluticasone 50 MCG/ACT nasal spray Commonly known as: FLONASE Place 2 sprays daily into both nostrils. What changed:   when to take this  reasons to take this   ketoconazole 2 % shampoo Commonly known as: NIZORAL APPLY EXTERNALLY 2 TIMES A WEEK What changed: See the new instructions.   multivitamin with minerals Tabs tablet Take 1 tablet by mouth daily.   oxyCODONE 5 MG immediate release tablet Commonly known as: Oxy IR/ROXICODONE Take 1 tablet (5 mg total) by mouth every 6 (six) hours as needed for severe pain.   polyethylene glycol powder 17 GM/SCOOP powder Commonly known as: GLYCOLAX/MIRALAX Take 1 Container by mouth daily as needed for mild constipation. 1 scoop as needed   promethazine 12.5 MG tablet Commonly known as: PHENERGAN Take 1 tablet (12.5 mg total) by mouth every 6 (six) hours as needed for nausea or vomiting.   vitamin C 500 MG tablet Commonly known as: ASCORBIC ACID Take 500 mg by mouth daily.   vitamin E 1000 UNIT capsule Generic drug: vitamin E Take 1,000 Units by mouth daily.       Allergies:  Allergies  Allergen Reactions  . Cephalexin Hives  . Latex Itching, Swelling and Rash    Past Medical History, Surgical history, Social history, and Family History were reviewed and updated.  Review of Systems: Review of Systems  Constitutional: Negative.   HENT: Negative.   Eyes: Negative.   Respiratory: Negative.   Cardiovascular: Negative.   Gastrointestinal: Negative.   Genitourinary: Negative.   Musculoskeletal: Negative.   Skin: Negative.   Neurological: Negative.   Endo/Heme/Allergies: Negative.   Psychiatric/Behavioral: Negative.      Physical Exam:  vitals were not taken for this visit.   Wt Readings from Last 3 Encounters:  06/05/20 201 lb 15.1 oz (91.6 kg)  05/19/20 197 lb 12.8  oz (89.7 kg)  03/13/20 (!) 204 lb (92.5 kg)    Physical Exam Vitals reviewed.  HENT:     Head: Normocephalic and atraumatic.  Eyes:     Pupils: Pupils are equal, round, and reactive to light.  Cardiovascular:     Rate and Rhythm: Normal rate and regular rhythm.     Heart sounds: Normal heart sounds.  Pulmonary:     Effort: Pulmonary effort is normal.     Breath sounds: Normal breath sounds.  Abdominal:     General: Bowel sounds are normal.     Palpations: Abdomen is soft.     Comments: Abdominal exam shows healed laparoscopic wounds.  She has 3 of them.  There is no abdominal fluid.  There is no guarding or rebound tenderness.  There is no palpable liver or spleen tip.  Musculoskeletal:        General: No tenderness or deformity. Normal range of motion.     Cervical back: Normal range of motion.  Lymphadenopathy:     Cervical: No cervical adenopathy.  Skin:    General: Skin is warm and dry.     Findings: No erythema or rash.  Neurological:     Mental Status: She is alert and oriented to person, place, and time.  Psychiatric:        Behavior: Behavior normal.        Thought Content: Thought content normal.        Judgment: Judgment normal.      Lab Results  Component Value Date   WBC 6.9 06/30/2020   HGB 9.1 (L) 06/30/2020   HCT 31.1 (L) 06/30/2020   MCV 62.4 (L) 06/30/2020   PLT 301 06/30/2020   Lab Results  Component Value Date   FERRITIN 1,129 (H) 06/05/2020   IRON 66 06/05/2020   TIBC 249 (L) 06/05/2020   UIBC 183 06/05/2020   IRONPCTSAT 26 06/05/2020   Lab Results  Component Value Date   RETICCTPCT 3.0 06/30/2020   RBC 4.95 06/30/2020   RBC 4.98 06/30/2020   RETICCTABS 202.8 (H) 05/24/2014   No results found for: KPAFRELGTCHN, LAMBDASER, KAPLAMBRATIO No results found for: IGGSERUM, IGA, IGMSERUM No results found for: Odetta Pink, SPEI   Chemistry      Component Value Date/Time   NA 143  06/30/2020 0822   NA 145 08/04/2017 0829   K 4.1 06/30/2020 0822   K 4.0 08/04/2017 0829   CL 107 06/30/2020 0822   CL 108 08/04/2017 0829   CO2 30 06/30/2020 0822   CO2 26 08/04/2017 0829   BUN 13 06/30/2020 0822   BUN 11 08/04/2017 0829   CREATININE 0.70 06/30/2020 0822   CREATININE 0.8 08/04/2017 0829      Component Value  Date/Time   CALCIUM 9.6 06/30/2020 0822   CALCIUM 9.4 08/04/2017 0829   ALKPHOS 53 06/30/2020 0822   ALKPHOS 63 08/04/2017 0829   AST 10 (L) 06/30/2020 0822   ALT 10 06/30/2020 0822   ALT 14 08/04/2017 0829   BILITOT 0.5 06/30/2020 4469       Impression and Plan: Ms. 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  We will see what her iron levels are.  I am glad that she had the appendectomy.  It sounds like everything went quite well with this.  I still have thought about using Luspatercept on her.  However, we we will continue to hold off on this.  I think we can now get her back after all the holidays.   [name], MD 11/15/20218:57 AM

## 2020-06-30 NOTE — Telephone Encounter (Signed)
appts made and printed for 09/01/20 for pt per 06/30/20 los ... aom

## 2020-07-29 IMAGING — DX LUMBAR SPINE - COMPLETE 4+ VIEW
5 series · 5 of 5 positions shown · non-contrast
Comparison: MRI 04/24/2016, radiograph 03/12/2014

CLINICAL DATA: Low back pain

EXAM:
LUMBAR SPINE - COMPLETE 4+ VIEW

[l-spine ap]
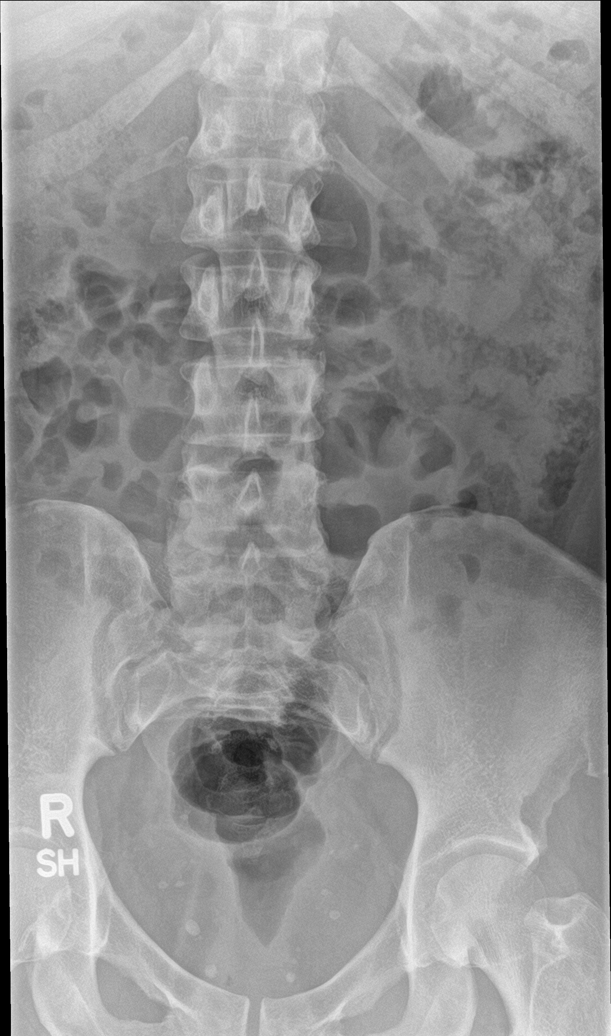

[l-spine obl (1 of 2)]
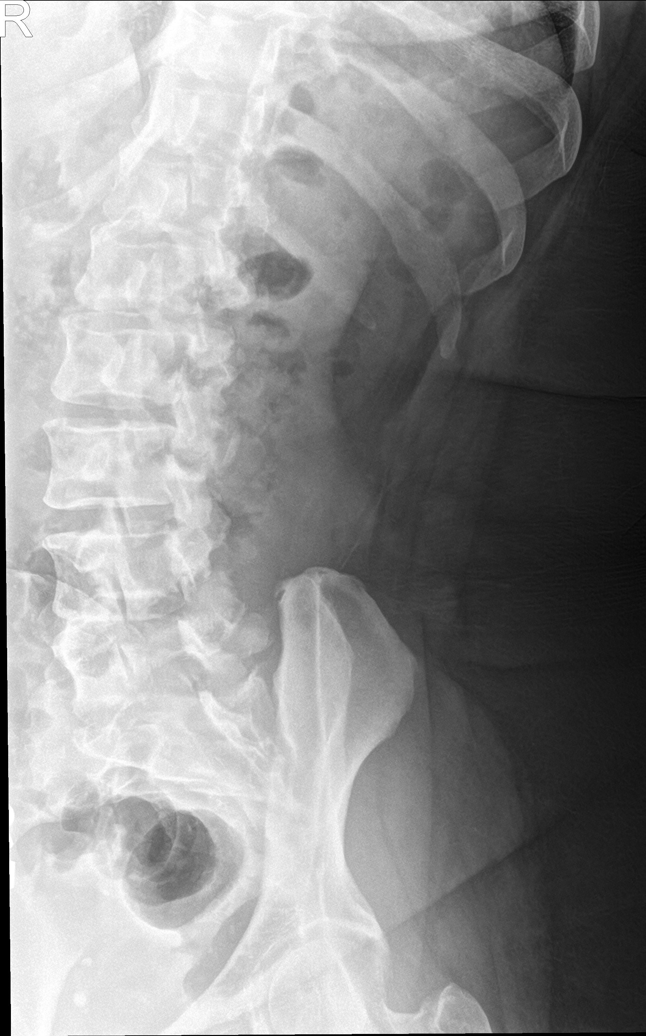

[l-spine obl (2 of 2)]
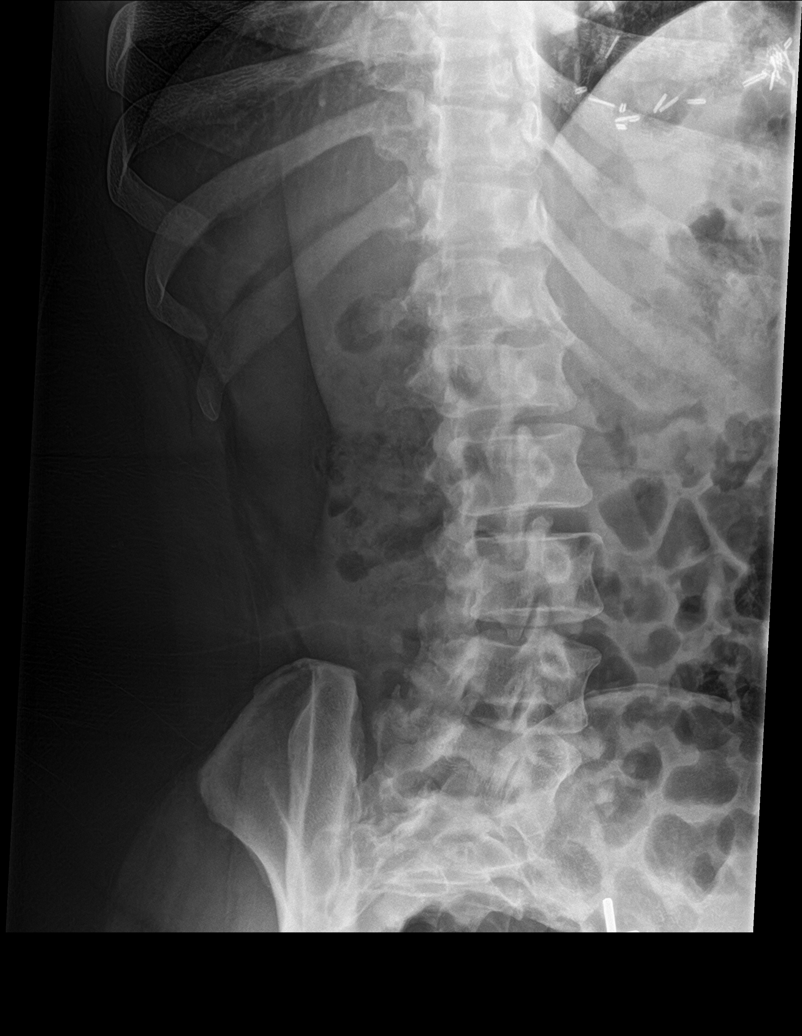

[l-spine lat]
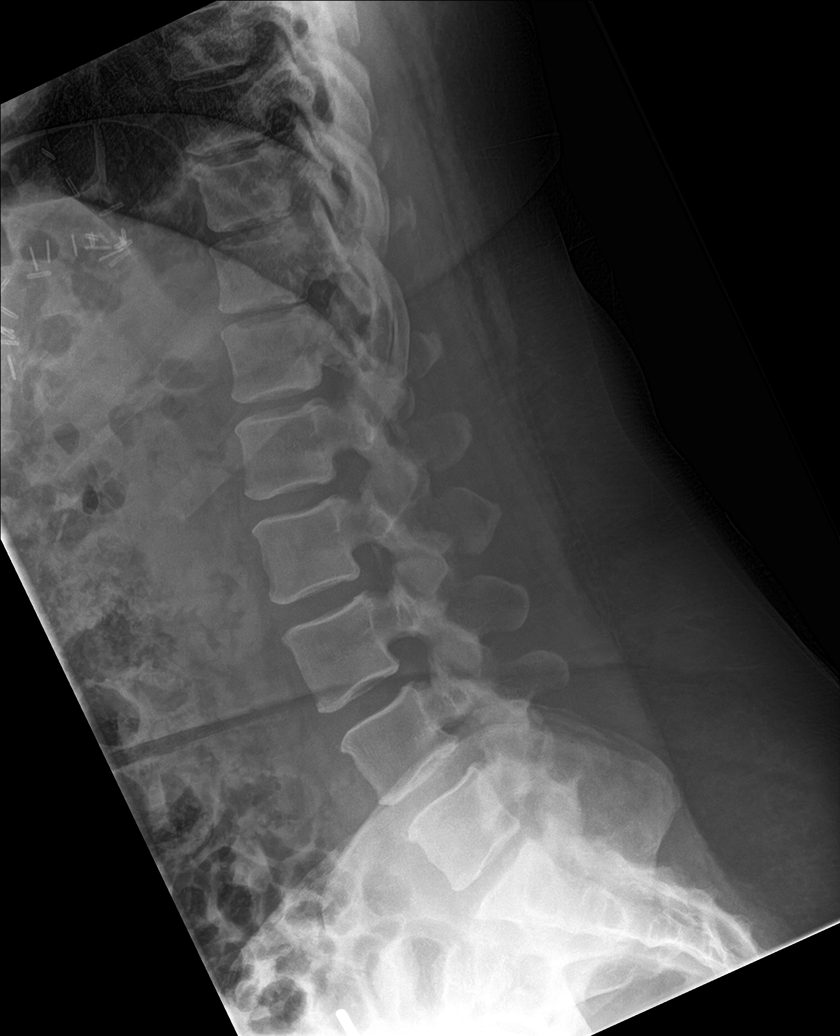

[l-spine spot]
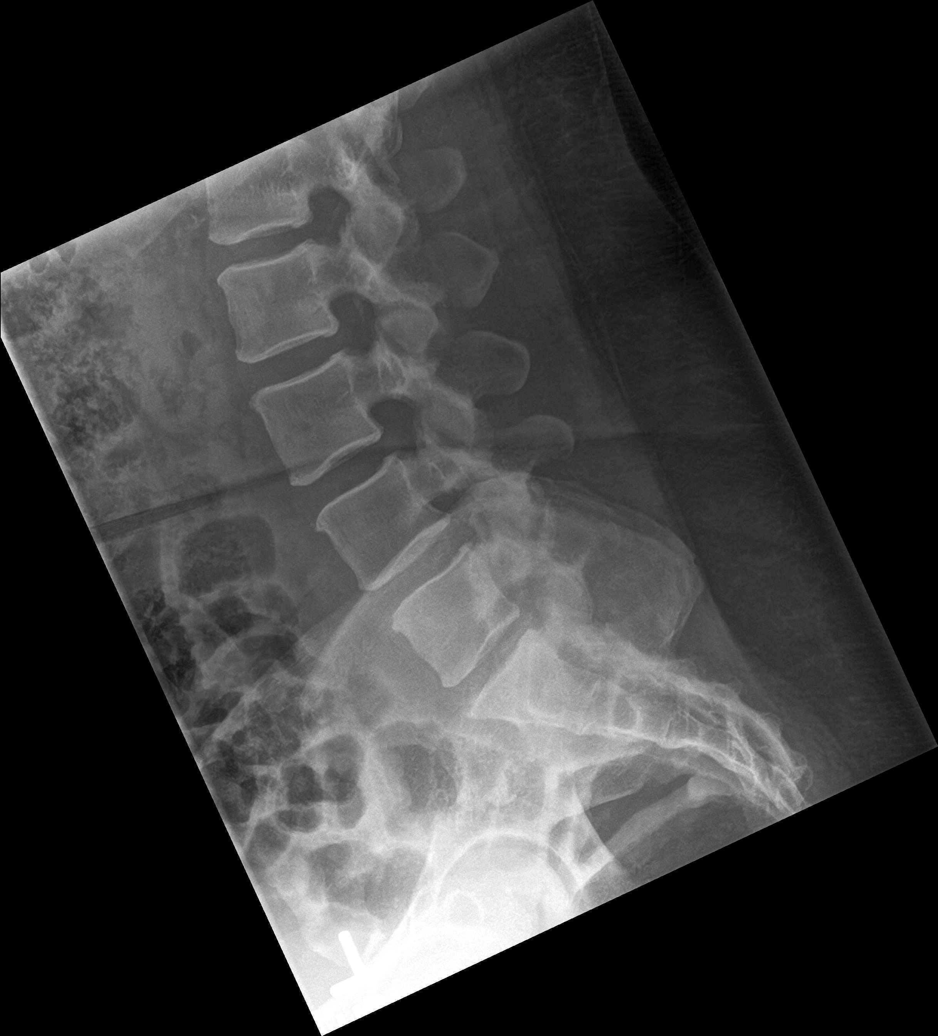

[5 of 5 positions shown; findings below may reference images not displayed]

FINDINGS: There is no evidence of lumbar spine fracture. Alignment is normal.
Intervertebral disc spaces are maintained.
IMPRESSION: Negative.

## 2020-09-01 ENCOUNTER — Other Ambulatory Visit: Payer: BC Managed Care – PPO

## 2020-09-01 ENCOUNTER — Ambulatory Visit: Payer: BC Managed Care – PPO | Admitting: Hematology & Oncology

## 2020-09-12 ENCOUNTER — Other Ambulatory Visit: Payer: Self-pay

## 2020-09-12 ENCOUNTER — Encounter: Payer: Self-pay | Admitting: Family

## 2020-09-12 ENCOUNTER — Inpatient Hospital Stay: Payer: BC Managed Care – PPO | Attending: Family

## 2020-09-12 ENCOUNTER — Inpatient Hospital Stay (HOSPITAL_BASED_OUTPATIENT_CLINIC_OR_DEPARTMENT_OTHER): Payer: BC Managed Care – PPO | Admitting: Family

## 2020-09-12 VITALS — BP 121/66 | HR 75 | Temp 98.1°F | Resp 18 | Ht 64.0 in | Wt 200.4 lb

## 2020-09-12 DIAGNOSIS — Z79899 Other long term (current) drug therapy: Secondary | ICD-10-CM | POA: Diagnosis not present

## 2020-09-12 DIAGNOSIS — K909 Intestinal malabsorption, unspecified: Secondary | ICD-10-CM | POA: Insufficient documentation

## 2020-09-12 DIAGNOSIS — D509 Iron deficiency anemia, unspecified: Secondary | ICD-10-CM | POA: Insufficient documentation

## 2020-09-12 DIAGNOSIS — D563 Thalassemia minor: Secondary | ICD-10-CM | POA: Diagnosis not present

## 2020-09-12 DIAGNOSIS — Z9884 Bariatric surgery status: Secondary | ICD-10-CM | POA: Insufficient documentation

## 2020-09-12 DIAGNOSIS — D500 Iron deficiency anemia secondary to blood loss (chronic): Secondary | ICD-10-CM | POA: Diagnosis not present

## 2020-09-12 LAB — CMP (CANCER CENTER ONLY)
ALT: 8 U/L (ref 0–44)
AST: 10 U/L — ABNORMAL LOW (ref 15–41)
Albumin: 4.3 g/dL (ref 3.5–5.0)
Alkaline Phosphatase: 55 U/L (ref 38–126)
Anion gap: 6 (ref 5–15)
BUN: 19 mg/dL (ref 6–20)
CO2: 27 mmol/L (ref 22–32)
Calcium: 9.6 mg/dL (ref 8.9–10.3)
Chloride: 106 mmol/L (ref 98–111)
Creatinine: 0.79 mg/dL (ref 0.44–1.00)
GFR, Estimated: >60 mL/min (ref >60)
Glucose, Bld: 93 mg/dL (ref 70–99)
Potassium: 5 mmol/L (ref 3.5–5.1)
Sodium: 139 mmol/L (ref 135–145)
Total Bilirubin: 0.5 mg/dL (ref 0.3–1.2)
Total Protein: 7 g/dL (ref 6.5–8.1)

## 2020-09-12 LAB — RETICULOCYTES
Immature Retic Fract: 16.9 % — ABNORMAL HIGH (ref 2.3–15.9)
RBC.: 5.36 MIL/uL — ABNORMAL HIGH (ref 3.87–5.11)
Retic Count, Absolute: 146.3 10*3/uL (ref 19.0–186.0)
Retic Ct Pct: 2.7 % (ref 0.4–3.1)

## 2020-09-12 LAB — CBC WITH DIFFERENTIAL (CANCER CENTER ONLY)
Abs Immature Granulocytes: 0.02 10*3/uL (ref 0.00–0.07)
Basophils Absolute: 0 10*3/uL (ref 0.0–0.1)
Basophils Relative: 1 %
Eosinophils Absolute: 0.1 10*3/uL (ref 0.0–0.5)
Eosinophils Relative: 2 %
HCT: 32.7 % — ABNORMAL LOW (ref 36.0–46.0)
Hemoglobin: 10 g/dL — ABNORMAL LOW (ref 12.0–15.0)
Immature Granulocytes: 0 %
Lymphocytes Relative: 45 %
Lymphs Abs: 3.4 10*3/uL (ref 0.7–4.0)
MCH: 18.4 pg — ABNORMAL LOW (ref 26.0–34.0)
MCHC: 30.6 g/dL (ref 30.0–36.0)
MCV: 60.2 fL — ABNORMAL LOW (ref 80.0–100.0)
Monocytes Absolute: 0.4 10*3/uL (ref 0.1–1.0)
Monocytes Relative: 5 %
Neutro Abs: 3.6 10*3/uL (ref 1.7–7.7)
Neutrophils Relative %: 47 %
Platelet Count: 315 10*3/uL (ref 150–400)
RBC: 5.43 MIL/uL — ABNORMAL HIGH (ref 3.87–5.11)
RDW: 16.1 % — ABNORMAL HIGH (ref 11.5–15.5)
WBC Count: 7.6 10*3/uL (ref 4.0–10.5)
nRBC: 0 % (ref 0.0–0.2)

## 2020-09-12 LAB — SAVE SMEAR(SSMR), FOR PROVIDER SLIDE REVIEW

## 2020-09-12 MED ORDER — KETOCONAZOLE 2 % EX SHAM
MEDICATED_SHAMPOO | CUTANEOUS | 11 refills | Status: DC
Start: 2020-09-12 — End: 2021-12-14

## 2020-09-12 NOTE — Progress Notes (Signed)
Hematology and Oncology Follow Up Visit  Abigail Hall [id number] 12-11-1980 40 y.o. 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 She is doing well but has noted several nose bleeds that took greater than 10 minutes to stop. She states that she had nose bleeds a lot as a child and that it may be due to the dry heat. She will let us know if this persists or worsens.  No other blood loss noted. No abnormal bruising, no petechiae.  Hgb is improved at 10.0, MCV 60, WBC count 7.6 and platelets 315.  She has had some chills occasionally.  No fever, n/v, cough, rash, dizziness, SOB, chest pain, palpitations or changes in bowel or bladder habits. She has occasional abdominal discomfort and utilizes stool softeners when needed.  No swelling or tenderness in her extremities at this time.  She has intermittent numbness and tingling in her fingertips and toes.  No falls or syncope to report.  She has a good appetite and is doing her best to stay well hydrated. Her weight is stable at 200 lbs.   ECOG Performance Status: 1 - Symptomatic but completely ambulatory  Medications:  Allergies as of 09/12/2020      Reactions   Cephalexin Hives   Latex Itching, Swelling, Rash      Medication List       Accurate as of September 12, 2020  3:47 PM. If you have any questions, ask your nurse or doctor.        acetaminophen 325 MG tablet Commonly known as: TYLENOL Take 2 tablets (650 mg total) by mouth every 6 (six) hours as needed for mild pain.   ALPRAZolam 1 MG tablet Commonly known as: XANAX Take 1 mg by mouth 3 (three) times daily as needed for anxiety.   Biotin 10000 MCG Tbdp Take 10,000 mcg by mouth daily.    divalproex 500 MG DR tablet Commonly known as: DEPAKOTE Take 2 tablets (1,000 mg total) by mouth 2 (two) times daily. What changed: when to take this   ELDERBERRY PO Take 1 tablet by mouth daily.   Fluocinolone Acetonide Scalp 0.01 % Oil APPLY EXTERNALLY TO THE AFFECTED AREA DAILY AS NEEDED What changed: See the new instructions.   fluticasone 50 MCG/ACT nasal spray Commonly known as: FLONASE Place 2 sprays daily into both nostrils. What changed:   when to take this  reasons to take this   ketoconazole 2 % shampoo Commonly known as: NIZORAL APPLY EXTERNALLY 2 TIMES A WEEK What changed: See the new instructions.   multivitamin with minerals Tabs tablet Take 1 tablet by mouth daily.   oxyCODONE 5 MG immediate release tablet Commonly known as: Oxy IR/ROXICODONE Take 1 tablet (5 mg total) by mouth every 6 (six) hours as needed for severe pain.   polyethylene glycol powder 17 GM/SCOOP powder Commonly known as: GLYCOLAX/MIRALAX Take 1 Container by mouth daily as needed for mild constipation. 1 scoop as needed   promethazine 12.5 MG tablet Commonly known as: PHENERGAN Take 1 tablet (12.5 mg total) by mouth every 6 (six) hours as needed for nausea or vomiting.   vitamin C 500 MG tablet Commonly known as: ASCORBIC ACID Take 500 mg by mouth daily.  vitamin E 1000 UNIT capsule Take 1,000 Units by mouth daily.   zolpidem 5 MG tablet Commonly known as: AMBIEN Take 1 tablet (5 mg total) by mouth at bedtime as needed for sleep.       Allergies:  Allergies  Allergen Reactions  . Cephalexin Hives  . Latex Itching, Swelling and Rash    Past Medical History, Surgical history, Social history, and Family History were reviewed and updated.  Review of Systems: All other 10 point review of systems is negative.   Physical Exam:  vitals were not taken for this visit.   Wt Readings from Last 3 Encounters:  06/30/20 202 lb (91.6 kg)  06/05/20 201 lb 15.1 oz (91.6 kg)   05/19/20 197 lb 12.8 oz (89.7 kg)    Ocular: Sclerae unicteric, pupils equal, round and reactive to light Ear-nose-throat: Oropharynx clear, dentition fair Lymphatic: No cervical or supraclavicular adenopathy Lungs no rales or rhonchi, good excursion bilaterally Heart regular rate and rhythm, no murmur appreciated Abd soft, nontender, positive bowel sounds MSK no focal spinal tenderness, no joint edema Neuro: non-focal, well-oriented, appropriate affect Breasts: Deferred   Lab Results  Component Value Date   WBC 7.6 09/12/2020   HGB 10.0 (L) 09/12/2020   HCT 32.7 (L) 09/12/2020   MCV 60.2 (L) 09/12/2020   PLT 315 09/12/2020   Lab Results  Component Value Date   FERRITIN 2,181 (H) 06/30/2020   IRON 124 06/30/2020   TIBC 248 06/30/2020   UIBC 124 06/30/2020   IRONPCTSAT 50 06/30/2020   Lab Results  Component Value Date   RETICCTPCT 2.7 09/12/2020   RBC 5.36 (H) 09/12/2020   RETICCTABS 202.8 (H) 05/24/2014   No results found for: KPAFRELGTCHN, LAMBDASER, KAPLAMBRATIO No results found for: IGGSERUM, IGA, IGMSERUM No results found for: Odetta Pink, SPEI   Chemistry      Component Value Date/Time   NA 143 06/30/2020 0822   NA 145 08/04/2017 0829   K 4.1 06/30/2020 0822   K 4.0 08/04/2017 0829   CL 107 06/30/2020 0822   CL 108 08/04/2017 0829   CO2 30 06/30/2020 0822   CO2 26 08/04/2017 0829   BUN 13 06/30/2020 0822   BUN 11 08/04/2017 0829   CREATININE 0.70 06/30/2020 0822   CREATININE 0.8 08/04/2017 0829      Component Value Date/Time   CALCIUM 9.6 06/30/2020 0822   CALCIUM 9.4 08/04/2017 0829   ALKPHOS 53 06/30/2020 0822   ALKPHOS 63 08/04/2017 0829   AST 10 (L) 06/30/2020 0822   ALT 10 06/30/2020 0822   ALT 14 08/04/2017 0829   BILITOT 0.5 06/30/2020 0822       Impression and Plan: Abigail Hall is a very pleasant39 yo [name] withbeta thalassemia and iron deficiency anemia  secondary to malabsorption after gastric sleeve surgery. Hgb is stable at this time.  Iron studies are pending. We will replace if needed. No other intervention needed at this time.  We will plan to see her again in 2 months.  She was encouraged to contact our office with any questions or concerns.   Laverna Peace, NP 1/28/20223:47 PM

## 2020-09-15 ENCOUNTER — Telehealth: Payer: Self-pay | Admitting: *Deleted

## 2020-09-15 LAB — IRON AND TIBC
Iron: 62 ug/dL (ref 41–142)
Saturation Ratios: 23 % (ref 21–57)
TIBC: 265 ug/dL (ref 236–444)
UIBC: 203 ug/dL (ref 120–384)

## 2020-09-15 LAB — HGB FRACTIONATION CASCADE
Hgb A2: 5.1 % — ABNORMAL HIGH (ref 1.8–3.2)
Hgb A: 93.2 % — ABNORMAL LOW (ref 96.4–98.8)
Hgb F: 1.7 % (ref 0.0–2.0)
Hgb S: 0 %

## 2020-09-15 LAB — FERRITIN: Ferritin: 2110 ng/mL — ABNORMAL HIGH (ref 11–307)

## 2020-09-15 NOTE — Telephone Encounter (Signed)
Message received from patient wanting to know if she needs an iron infusion from her labs drawn on 09/12/20.  Labs reviewed by S. [place] NP.  Call placed back to patient and patient notified that she does not need an iron infusion at this time per order of S. [place] [address] [place] NP.  Pt appreciative of call back and has no further questions at this time.

## 2020-10-13 ENCOUNTER — Other Ambulatory Visit: Payer: Self-pay | Admitting: Neurology

## 2020-11-06 ENCOUNTER — Inpatient Hospital Stay: Payer: BC Managed Care – PPO | Admitting: Hematology & Oncology

## 2020-11-06 ENCOUNTER — Inpatient Hospital Stay: Payer: BC Managed Care – PPO | Attending: Family

## 2020-11-17 ENCOUNTER — Telehealth: Payer: Self-pay

## 2020-11-17 ENCOUNTER — Telehealth: Payer: Self-pay | Admitting: Family Medicine

## 2020-11-17 NOTE — Telephone Encounter (Signed)
Pt cal;led in to r/s her no show appt, pt states she did not get an auto reminder for the appt ans does not use her my chart  Abigail Hall

## 2020-11-17 NOTE — Telephone Encounter (Signed)
Contacted pt, appointment switched to Mychart VV.

## 2020-11-17 NOTE — Telephone Encounter (Signed)
Pt. Came in to the office today thinking her appt was 4/4 but it's actually 4/5. Pt. Wanted to know if she could switch to a telephone visit for tomorrow or skip the visit all together since she just needs a medication refill. Best call back is [phone]

## 2020-11-18 ENCOUNTER — Encounter: Payer: Self-pay | Admitting: Family Medicine

## 2020-11-18 ENCOUNTER — Telehealth (INDEPENDENT_AMBULATORY_CARE_PROVIDER_SITE_OTHER): Payer: BC Managed Care – PPO | Admitting: Family Medicine

## 2020-11-18 DIAGNOSIS — G40309 Generalized idiopathic epilepsy and epileptic syndromes, not intractable, without status epilepticus: Secondary | ICD-10-CM | POA: Diagnosis not present

## 2020-11-18 MED ORDER — DIVALPROEX SODIUM 500 MG PO DR TAB: 500.0000 mg | DELAYED_RELEASE_TABLET | Freq: Two times a day (BID) | ORAL | 3 refills | Status: DC

## 2020-11-18 NOTE — Progress Notes (Addendum)
PATIENT: Abigail Hall DOB: [birth year]  REASON FOR VISIT: follow up HISTORY FROM: patient  Virtual Visit via Telephone Note  I connected with Abigail Hall on 11/18/20 at  9:30 AM EDT by telephone and verified that I am speaking with the correct person using two identifiers.   I discussed the limitations, risks, security and privacy concerns of performing an evaluation and management service by telephone and the availability of in person appointments. I also discussed with the patient that there may be a patient responsible charge related to this service. The patient expressed understanding and agreed to proceed.   History of Present Illness:  11/18/20 ALL:  Abigail Hall is a 40 y.o. female here today for follow up for seizures. She was last seen by Abigail Hall 08/2018. She wanted to wean divalproex due to side effects and restart levetiracetam. Weaning schedule was provided. She does not remember weaning divalproex and reports that she did not remember taking levetiracetam.   She has continued divalproex Abigail. Prescription previously written for 1000mg  BID. Patient reports that she has taken 1000mg  daily for as long as she can remember. Last refill for 360 tablets in 01/2020. Last seizure was 04/23/2020 in the setting of COVID-19 infection. Otherwise, she has felt well. No other seizure activity. She had blood work in 08/2020 that has been reviewed for today's visit.    Hall Abigail Hall: Patient is here for lumbar radiculopathy and seizure disorder.  She has not had a seizure.  She is well controlled on Depakote however she does not like the side effects.  In the past she is tried multiple medications she had side effects to topiramate, she was tried on Lamictal and Vimpat and several others.  She was on Keppra at one point and was doing well but after the birth of her child she had seizures and she was switched back to Depakote.  She like to consider Keppra  again.  Discussed that changing medications include the risk of seizure breakthrough and she will have to be on 2 seizure medications for several months while we transition.  She does understand the risks and would like to try Keppra.  I discussed would like to try her on a high-dose Keppra XR at thousand milligrams twice daily.  I do like dosing Keppra XR twice daily even though it is a once daily extended release formulation. Lumbar radic improved.  Interval history:Patient stopped the topiramate and she feels much better. Lamictal and Keppra did not help her in the past. We'll continue Depakote at this time. Her biggest concern is weight gain especially on the Depakote. Recommended a healthy weight and wellness Center. Discussed obesity. Discussed diet and exercise. She has scalp psoriasis, can give her some shampoo but recommend pcp or derm.  Interval History 11/09/2016: Patient returns for follow-up on seizure disorder. She has been on Depakote. At last appointment she wanted to switch agents due to weight gain side placed her on topiramate. She has been on Keppra in the past during pregnancy. Lamictal did not work for her in the past. 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 This could be the cause of her significant fatigue and weakness over the last month. Other causes can be medication side effects of topiramate or being on both topiramate and Depakote. Hair loss can be caused by  topiramate. We can stop the Topiramate. She doesn't know if she is snoring. Just started over the last month. She has been missing work due to being so tired. Was hoping to switch Depakote for Topiramate but need to stop and see if this is causing side effects. Patient crying in the office today, requesting disability for this week for me, endorses multiple  times that she is taking the topiramate. States states she last took topiramate last evening. Will check a Topiramate level.  Interval history 09/23/2016: She was evaluated by [facility] for right L5 radic and has a CT myelogram and three is no surgical intervention at this time there is no nerve root impingement. No Seizures. She is doing well on her Depakote and no seizures. Gabapentin did not help and the pain comes and goes. The last time she had a seizure was last march. She has been on the Depakote "forever". She was on Keppra for her pregnancy, she is not planning on having anymore kids and had a hysterectomy. Depakote works for her but the weight gain is significant and she would like to try a different AED. Discussed risks including breakthrough seizures, discussed choices, Topamax is a good AED but often needs higher doses that can cause side effects. Lamictal does not work for her.   Abigail Poindexteris a 40 y.o.femalehere as a referral from Abigail. Karlton Lemon MD for right leg pain and radiculopathy. Ongoing since 2015 no inciting events or trauma. She has had ESI which initially helped a little but not working anymore. She is having difficulty with weight gain since she cannot work out especially on Depakote for her seizure disorder. She can't work out because of the right leg weakness and the pain. She has leg pain with radicular symptoms into the right leg with numbness of the foot. Foot numbness since August. The pain starts in the upper right leg and radiates down the side and back of the thigh and to the lateral lower leg and into the foot. Her leg gives out, weak. Most painful when in a position for a long time such as sitting in one position for a long time. At one point there was so much pain she couldn't even sit. Muscle relaxers don't help, hydrocodone help a little bit but not on it now and doesn't want to take it , hard to sleep because she wakes with pain. Her right foot is numb in  the toes and in the lateral aspect of the foot. She can' t feel the bottom of her heel, continuously numb, she can;t drive for long periods. She gets cramps in the right foot. No changes in bowel or bladder.   Reviewed notes, labs and imaging from outside physicians, which showed:  BUN 8, creatinine 0.65 05/2014, TSH 1.552 10/2011  MRI lumbar spine 04/2016: personally reviewed images and agree with the following  Disc levels:  L1-2: Negative.  L2-3: Normal interspace. No disc bulge or disc protrusion. Left-sided facet arthrosis. No canal or foraminal stenosis.  L3-4: Negative.  L4-5: Mild diffuse degenerative disc bulge with disc desiccation, stable. No focal disc herniation. Mild bilateral facet arthrosis, right worse than left. No canal stenosis. Mild bilateral foraminal narrowing, stable.  L5-S1: Negative. Aic 6 IMPRESSION: 1. Shallow broad-based disc protrusion at L4-5 with resultant mild bilateral foraminal narrowing, stable from prior. No other significant degenerative disc disease. No new focal disc herniation. 2. Mild facet arthrosis at L2-3 and L4-5 as above.   Observations/Objective:  Generalized: Well developed, in  no acute distress  Mentation: Alert oriented to time, place, history taking. Follows all commands speech and language fluent   Assessment and Plan:  40 y.o. year old female  has a past medical history of Anemia, Anemia, iron deficiency (04/10/2012), Constipation, Depression (04/26/13), Generalized headaches, Hemorrhoids, History of blood transfusion (02/2013), Nausea & vomiting, Rectal bleeding, Rectal pain, Seasonal allergies, Seizures ([place]), Thalassemia, and Weakness. 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 Unfortunately, she did have one breakthrough seizure 04/2020 in the setting of COVID 19 infection. She has continued divalproex Abigail 1000mg  daily. We have discussed  previously recommended dosing of 1000mg  twice daily. Previous notes indicate concerns for worsening side effects and weight gain on divalproex, however, she has not tolerated other AEDs and had breakthrough seizures on levetiracetam. After discussion with Abigail Hall, she was advised to continue 500mg  twice daily. Dosing should be every 12 hours for steady state of AED. I will update prescription and send refills, today. Labs were unremarkable in 08/2020. Seizure precautions reviewed. She will follow up in 1 year, sooner if needed. She verbalizes understanding and agreement with this plan.   No orders of the defined types were placed in this encounter.   Meds ordered this encounter  Medications  . divalproex (DEPAKOTE) 500 MG Abigail tablet    Sig: Take 1 tablet (500 mg total) by mouth 2 (two) times daily.    Dispense:  180 tablet    Refill:  3    Order Specific Question:   Supervising Provider    Answer:   Melvenia Beam [id number]     Follow Up Instructions:  I discussed the assessment and treatment plan with the patient. The patient was provided an opportunity to ask questions and all were answered. The patient agreed with the plan and demonstrated an understanding of the instructions.   The patient was advised to call back or seek an in-person evaluation if the symptoms worsen or if the condition fails to improve as anticipated.  I provided 20 minutes of non-face-to-face time during this encounter. Patient located at their place of residence during [place] visit. Provider is in the office.    Debbora Presto, NP    agree with assessment and plan Sarina Ill, MD

## 2020-11-18 NOTE — Patient Instructions (Signed)
Below is our plan:  We will continue divalproex DR, however, please take 500mg  (1 tablet) twice daily. This will help keep a steady dose of medication in your system to help reduce risk of breakthrough seizures.    Please make sure you are staying well hydrated. I recommend 50-60 ounces daily. Well balanced diet and regular exercise encouraged. Consistent sleep schedule with 6-8 hours recommended.   Please continue follow up with care team as directed.   Follow up in 1 year, sooner if needed   You may receive a survey regarding today's visit. I encourage you to leave honest feed back as I do use this information to improve patient care. Thank you for seeing me today!      Seizure, Adult A seizure is a sudden burst of abnormal electrical and chemical activity in the brain. Seizures usually last from 30 seconds to 2 minutes.  What are the causes? Common causes of this condition include:  Fever or infection.  Problems that affect the brain. These may include: ? A brain or head injury. ? Bleeding in the brain. ? A brain tumor.  Low levels of blood sugar or salt.  Kidney problems or liver problems.  Conditions that are passed from parent to child (are inherited).  Problems with a substance, such as: ? Having a reaction to a drug or a medicine. ? Stopping the use of a substance all of a sudden (withdrawal).  A stroke.  Disorders that affect how you develop. Sometimes, the cause may not be known.  What increases the risk?  Having someone in your family who has epilepsy. In this condition, seizures happen again and again over time. They have no clear cause.  Having had a tonic-clonic seizure before. This type of seizure causes you to: ? Tighten the muscles of the whole body. ? Lose consciousness.  Having had a head injury or strokes before.  Having had a lack of oxygen at birth. What are the signs or symptoms? There are many types of seizures. The symptoms vary  depending on the type of seizure you have. Symptoms during a seizure  Shaking that you cannot control (convulsions) with fast, jerky movements of muscles.  Stiffness of the body.  Breathing problems.  Feeling mixed up (confused).  Staring or not responding to sound or touch.  Head nodding.  Eyes that blink, flutter, or move fast.  Drooling, grunting, or making clicking sounds with your mouth  Losing control of when you pee or poop. Symptoms before a seizure  Feeling afraid, nervous, or worried.  Feeling like you may vomit.  Feeling like: ? You are moving when you are not. ? Things around you are moving when they are not.  Feeling like you saw or heard something before (dj vu).  Odd tastes or smells.  Changes in how you see. You may see flashing lights or spots. Symptoms after a seizure  Feeling confused.  Feeling sleepy.  Headache.  Sore muscles. How is this treated? If your seizure stops on its own, you will not need treatment. If your seizure lasts longer than 5 minutes, you will normally need treatment. Treatment may include:  Medicines given through an IV tube.  Avoiding things, such as medicines, that are known to cause your seizures.  Medicines to prevent seizures.  A device to prevent or control seizures.  Surgery.  A diet low in carbohydrates and high in fat (ketogenic diet). Follow these instructions at home: Medicines  Take over-the-counter and prescription medicines  only as told by your doctor.  Avoid foods or drinks that may keep your medicine from working, such as alcohol. Activity  Follow instructions about driving, swimming, or doing things that would be dangerous if you had another seizure. Wait until your doctor says it is safe for you to do these things.  If you live in the [place] ask your local department of motor vehicles when you can drive.  Get a lot of rest. Teaching others  Teach friends and family what to do when you  have a seizure. They should: ? Help you get down to the ground. ? Protect your head and body. ? Loosen any clothing around your neck. ? Turn you on your side. ? Know whether or not you need emergency care. ? Stay with you until you are better.  Also, tell them what not to do if you have a seizure. Tell them: ? They should not hold you down. ? They should not put anything in your mouth.   General instructions  Avoid anything that gives you seizures.  Keep a seizure diary. Write down: ? What you remember about each seizure. ? What you think caused each seizure.  Keep all follow-up visits. Contact a doctor if:  You have another seizure or seizures. Call the doctor each time you have a seizure.  The pattern of your seizures changes.  You keep having seizures with treatment.  You have symptoms of being sick or having an infection.  You are not able to take your medicine. Get help right away if:  You have any of these problems: ? A seizure that lasts longer than 5 minutes. ? Many seizures in a row and you do not feel better between seizures. ? A seizure that makes it harder to breathe. ? A seizure and you can no longer speak or use part of your body.  You do not wake up right after a seizure.  You get hurt during a seizure.  You feel confused or have pain right after a seizure. These symptoms may be an emergency. Get help right away. Call your local emergency services (911 in the [place]  Do not wait to see if the symptoms will go away.  Do not drive yourself to the hospital. Summary  A seizure is a sudden burst of abnormal electrical and chemical activity in the brain. Seizures normally last from 30 seconds to 2 minutes.  Causes of seizures include illness, injury to the head, low levels of blood sugar or salt, and certain conditions.  Most seizures will stop on their own in less than 5 minutes. Seizures that last longer than 5 minutes are a medical emergency and need  treatment right away.  Many medicines are used to treat seizures. Take over-the-counter and prescription medicines only as told by your doctor. This information is not intended to replace advice given to you by your health care provider. Make sure you discuss any questions you have with your health care provider. Document Revised: 02/08/2020 Document Reviewed: 02/08/2020 Elsevier Patient Education  [place].

## 2020-11-19 ENCOUNTER — Inpatient Hospital Stay: Payer: BC Managed Care – PPO | Attending: Family

## 2020-11-19 ENCOUNTER — Encounter: Payer: Self-pay | Admitting: Hematology & Oncology

## 2020-11-19 ENCOUNTER — Other Ambulatory Visit: Payer: Self-pay

## 2020-11-19 ENCOUNTER — Inpatient Hospital Stay (HOSPITAL_BASED_OUTPATIENT_CLINIC_OR_DEPARTMENT_OTHER): Payer: BC Managed Care – PPO | Admitting: Hematology & Oncology

## 2020-11-19 ENCOUNTER — Telehealth: Payer: Self-pay | Admitting: Family Medicine

## 2020-11-19 VITALS — BP 117/78 | HR 75 | Temp 98.2°F | Resp 20 | Wt 204.0 lb

## 2020-11-19 DIAGNOSIS — Z9884 Bariatric surgery status: Secondary | ICD-10-CM | POA: Diagnosis not present

## 2020-11-19 DIAGNOSIS — Z79899 Other long term (current) drug therapy: Secondary | ICD-10-CM | POA: Diagnosis not present

## 2020-11-19 DIAGNOSIS — K909 Intestinal malabsorption, unspecified: Secondary | ICD-10-CM | POA: Diagnosis not present

## 2020-11-19 DIAGNOSIS — D563 Thalassemia minor: Secondary | ICD-10-CM | POA: Diagnosis not present

## 2020-11-19 DIAGNOSIS — D509 Iron deficiency anemia, unspecified: Secondary | ICD-10-CM | POA: Insufficient documentation

## 2020-11-19 DIAGNOSIS — D500 Iron deficiency anemia secondary to blood loss (chronic): Secondary | ICD-10-CM

## 2020-11-19 LAB — CBC WITH DIFFERENTIAL (CANCER CENTER ONLY)
Abs Immature Granulocytes: 0.07 10*3/uL (ref 0.00–0.07)
Basophils Absolute: 0 10*3/uL (ref 0.0–0.1)
Basophils Relative: 0 %
Eosinophils Absolute: 0.1 10*3/uL (ref 0.0–0.5)
Eosinophils Relative: 2 %
HCT: 30.5 % — ABNORMAL LOW (ref 36.0–46.0)
Hemoglobin: 9.1 g/dL — ABNORMAL LOW (ref 12.0–15.0)
Immature Granulocytes: 1 %
Lymphocytes Relative: 30 %
Lymphs Abs: 2.1 10*3/uL (ref 0.7–4.0)
MCH: 18.2 pg — ABNORMAL LOW (ref 26.0–34.0)
MCHC: 29.8 g/dL — ABNORMAL LOW (ref 30.0–36.0)
MCV: 60.9 fL — ABNORMAL LOW (ref 80.0–100.0)
Monocytes Absolute: 0.5 10*3/uL (ref 0.1–1.0)
Monocytes Relative: 7 %
Neutro Abs: 4.2 10*3/uL (ref 1.7–7.7)
Neutrophils Relative %: 60 %
Platelet Count: 236 10*3/uL (ref 150–400)
RBC: 5.01 MIL/uL (ref 3.87–5.11)
RDW: 16.5 % — ABNORMAL HIGH (ref 11.5–15.5)
WBC Count: 7.1 10*3/uL (ref 4.0–10.5)
nRBC: 0.6 % — ABNORMAL HIGH (ref 0.0–0.2)

## 2020-11-19 LAB — FERRITIN: Ferritin: 1555 ng/mL — ABNORMAL HIGH (ref 11–307)

## 2020-11-19 LAB — RETICULOCYTES
Immature Retic Fract: 22.2 % — ABNORMAL HIGH (ref 2.3–15.9)
RBC.: 4.93 MIL/uL (ref 3.87–5.11)
Retic Count, Absolute: 149.9 10*3/uL (ref 19.0–186.0)
Retic Ct Pct: 3 % (ref 0.4–3.1)

## 2020-11-19 LAB — CMP (CANCER CENTER ONLY)
ALT: 8 U/L (ref 0–44)
AST: 9 U/L — ABNORMAL LOW (ref 15–41)
Albumin: 4 g/dL (ref 3.5–5.0)
Alkaline Phosphatase: 46 U/L (ref 38–126)
Anion gap: 6 (ref 5–15)
BUN: 17 mg/dL (ref 6–20)
CO2: 26 mmol/L (ref 22–32)
Calcium: 9.4 mg/dL (ref 8.9–10.3)
Chloride: 109 mmol/L (ref 98–111)
Creatinine: 0.68 mg/dL (ref 0.44–1.00)
GFR, Estimated: >60 mL/min (ref >60)
Glucose, Bld: 127 mg/dL — ABNORMAL HIGH (ref 70–99)
Potassium: 4.1 mmol/L (ref 3.5–5.1)
Sodium: 141 mmol/L (ref 135–145)
Total Bilirubin: 0.5 mg/dL (ref 0.3–1.2)
Total Protein: 6.4 g/dL — ABNORMAL LOW (ref 6.5–8.1)

## 2020-11-19 LAB — SAVE SMEAR(SSMR), FOR PROVIDER SLIDE REVIEW

## 2020-11-19 LAB — IRON AND TIBC
Iron: 62 ug/dL (ref 41–142)
Saturation Ratios: 23 % (ref 21–57)
TIBC: 270 ug/dL (ref 236–444)
UIBC: 208 ug/dL (ref 120–384)

## 2020-11-19 NOTE — Telephone Encounter (Signed)
Spoke to pt relayed that 11-18-20 at 0951 receipt confirmed [facility].  I relayed to her will fax the confirmation sheet to them.  She can f/u tomorrow about this. She received only #60.  (maybe all they had??) read prescription wrong?? Fax confirmation received.

## 2020-11-19 NOTE — Telephone Encounter (Signed)
Pt states her medication divalproex (DEPAKOTE) 500 MG DR tablet was supposed to have been called in for 90 days but it wasn't pt asking for a call from RN to discuss why it wasn't

## 2020-11-19 NOTE — Progress Notes (Signed)
Hematology and Oncology Follow Up Visit  Abigail Hall [id number] Aug 01, 1981 40 y.o. 11/19/2020   Principle Diagnosis:  Thalassemia minor (beta thalassemia) History of iron deficiency anemia  Current Therapy:   Folic acid 2 mg by mouth daily- patient stopped herself IV iron as indicated - feraheme given on 10/15/2019   Interim History:  Abigail Hall is here today for follow-up.  So far, things are going pretty well for her.  She is having some issues with the school that her son goes to.  I do feel bad for her on that.  Only last saw her back in January, she is feeling pretty well.  Her ferritin was 2100 with an iron saturation of only 23%.  She has had no issues with taking folic acid.  She has had no fever.  She has had no problems with Covid.  She is still trying to work.  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  She has had no fever.  There is no cough or shortness of breath.  She has had no obvious change in bowel or bladder habits.  There has been no leg swelling.  She has had no headache.  Overall, her performance status is ECOG 0.  Medications:  Allergies as of 11/19/2020      Reactions   Cephalexin Hives   Latex Itching, Swelling, Rash      Medication List       Accurate as of November 19, 2020  1:59 PM. If you have any questions, ask your nurse or doctor.        STOP taking these medications   oxyCODONE 5 MG immediate release tablet Commonly known as: Oxy IR/ROXICODONE Stopped by: [name], MD   promethazine 12.5 MG tablet Commonly known as: PHENERGAN Stopped by: [name], MD     TAKE these medications   acetaminophen 325 MG tablet Commonly known as: TYLENOL Take 2 tablets (650 mg total) by mouth every 6 (six) hours as needed for mild pain.   ALPRAZolam 1 MG tablet Commonly known as: XANAX Take 1 mg by mouth 3 (three) times daily as needed for anxiety.   Biotin 10000 MCG Tbdp Take 10,000 mcg by mouth daily.    divalproex 500 MG DR tablet Commonly known as: DEPAKOTE Take 1 tablet (500 mg total) by mouth 2 (two) times daily.   ELDERBERRY PO Take 1 tablet by mouth daily.   Fluocinolone Acetonide Scalp 0.01 % Oil APPLY EXTERNALLY TO THE AFFECTED AREA DAILY AS NEEDED What changed: See the new instructions.   fluticasone 50 MCG/ACT nasal spray Commonly known as: FLONASE Place 2 sprays daily into both nostrils.   ketoconazole 2 % shampoo Commonly known as: NIZORAL APPLY EXTERNALLY 2 TIMES A WEEK   multivitamin with minerals Tabs tablet Take 1 tablet by mouth daily.   polyethylene glycol powder 17 GM/SCOOP powder Commonly known as: GLYCOLAX/MIRALAX Take 1 Container by mouth daily as needed for mild constipation. 1 scoop as needed   traZODone 50 MG tablet Commonly known as: DESYREL Take 50-150 mg by mouth at bedtime as needed.   Trintellix 10 MG Tabs tablet Generic drug: vortioxetine HBr Take 10 mg by mouth daily.   vitamin C 500 MG tablet Commonly known as: ASCORBIC ACID Take 500 mg by mouth daily.   vitamin E 1000 UNIT capsule Take 1,000 Units by mouth daily.   zolpidem 5 MG tablet Commonly known as: AMBIEN Take 1 tablet (5 mg total) by mouth at bedtime  as needed for sleep.       Allergies:  Allergies  Allergen Reactions  . Cephalexin Hives  . Latex Itching, Swelling and Rash    Past Medical History, Surgical history, Social history, and Family History were reviewed and updated.  Review of Systems: Review of Systems  Constitutional: Negative.   HENT: Negative.   Eyes: Negative.   Respiratory: Negative.   Cardiovascular: Negative.   Gastrointestinal: Negative.   Genitourinary: Negative.   Musculoskeletal: Negative.   Skin: Negative.   Neurological: Negative.   Endo/Heme/Allergies: Negative.   Psychiatric/Behavioral: Negative.      Physical Exam:  weight is 204 lb 0.6 oz (92.6 kg). Her oral temperature is 98.2 F (36.8 C). Her blood pressure is 117/78  and her pulse is 75. Her respiration is 20 and oxygen saturation is 100%.   Wt Readings from Last 3 Encounters:  11/19/20 204 lb 0.6 oz (92.6 kg)  09/12/20 200 lb 6.4 oz (90.9 kg)  06/30/20 202 lb (91.6 kg)    Physical Exam Vitals reviewed.  HENT:     Head: Normocephalic and atraumatic.  Eyes:     Pupils: Pupils are equal, round, and reactive to light.  Cardiovascular:     Rate and Rhythm: Normal rate and regular rhythm.     Heart sounds: Normal heart sounds.  Pulmonary:     Effort: Pulmonary effort is normal.     Breath sounds: Normal breath sounds.  Abdominal:     General: Bowel sounds are normal.     Palpations: Abdomen is soft.     Comments: Abdominal exam shows healed laparoscopic wounds.  She has 3 of them.  There is no abdominal fluid.  There is no guarding or rebound tenderness.  There is no palpable liver or spleen tip.  Musculoskeletal:        General: No tenderness or deformity. Normal range of motion.     Cervical back: Normal range of motion.  Lymphadenopathy:     Cervical: No cervical adenopathy.  Skin:    General: Skin is warm and dry.     Findings: No erythema or rash.  Neurological:     Mental Status: She is alert and oriented to person, place, and time.  Psychiatric:        Behavior: Behavior normal.        Thought Content: Thought content normal.        Judgment: Judgment normal.      Lab Results  Component Value Date   WBC 7.1 11/19/2020   HGB 9.1 (L) 11/19/2020   HCT 30.5 (L) 11/19/2020   MCV 60.9 (L) 11/19/2020   PLT 236 11/19/2020   Lab Results  Component Value Date   FERRITIN 1,555 (H) 11/19/2020   IRON 62 11/19/2020   TIBC 270 11/19/2020   UIBC 208 11/19/2020   IRONPCTSAT 23 11/19/2020   Lab Results  Component Value Date   RETICCTPCT 3.0 11/19/2020   RBC 4.93 11/19/2020   RBC 5.01 11/19/2020   RETICCTABS 202.8 (H) 05/24/2014   No results found for: KPAFRELGTCHN, LAMBDASER, KAPLAMBRATIO No results found for: IGGSERUM, IGA,  IGMSERUM No results found for: Odetta Pink, SPEI   Chemistry      Component Value Date/Time   NA 141 11/19/2020 0923   NA 145 08/04/2017 0829   K 4.1 11/19/2020 0923   K 4.0 08/04/2017 0829   CL 109 11/19/2020 0923   CL 108 08/04/2017 0829   CO2 26 11/19/2020  0923   CO2 26 08/04/2017 0829   BUN 17 11/19/2020 0923   BUN 11 08/04/2017 0829   CREATININE 0.68 11/19/2020 0923   CREATININE 0.8 08/04/2017 0829      Component Value Date/Time   CALCIUM 9.4 11/19/2020 0923   CALCIUM 9.4 08/04/2017 0829   ALKPHOS 46 11/19/2020 0923   ALKPHOS 63 08/04/2017 0829   AST 9 (L) 11/19/2020 0923   ALT 8 11/19/2020 0923   ALT 14 08/04/2017 0829   BILITOT 0.5 11/19/2020 0923       Impression and Plan: Ms. 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  We will see what her iron levels are.  I still have thought about using Luspatercept on her.  However, we we will continue to hold off on this.  We will try to get her back now in about 2 months or so.   [name], MD 4/6/20221:59 PM

## 2020-11-21 LAB — HGB FRACTIONATION CASCADE
Hgb A2: 5 % — ABNORMAL HIGH (ref 1.8–3.2)
Hgb A: 93.3 % — ABNORMAL LOW (ref 96.4–98.8)
Hgb F: 1.7 % (ref 0.0–2.0)
Hgb S: 0 %

## 2020-12-19 ENCOUNTER — Other Ambulatory Visit: Payer: Self-pay | Admitting: Family

## 2021-01-07 ENCOUNTER — Inpatient Hospital Stay (HOSPITAL_BASED_OUTPATIENT_CLINIC_OR_DEPARTMENT_OTHER): Payer: BC Managed Care – PPO | Admitting: Hematology & Oncology

## 2021-01-07 ENCOUNTER — Other Ambulatory Visit: Payer: Self-pay

## 2021-01-07 ENCOUNTER — Inpatient Hospital Stay: Payer: BC Managed Care – PPO | Attending: Family

## 2021-01-07 ENCOUNTER — Telehealth: Payer: Self-pay

## 2021-01-07 ENCOUNTER — Encounter: Payer: Self-pay | Admitting: Hematology & Oncology

## 2021-01-07 VITALS — BP 102/63 | HR 77 | Temp 98.1°F | Resp 20 | Wt 198.8 lb

## 2021-01-07 DIAGNOSIS — M1711 Unilateral primary osteoarthritis, right knee: Secondary | ICD-10-CM | POA: Insufficient documentation

## 2021-01-07 DIAGNOSIS — D509 Iron deficiency anemia, unspecified: Secondary | ICD-10-CM | POA: Insufficient documentation

## 2021-01-07 DIAGNOSIS — D563 Thalassemia minor: Secondary | ICD-10-CM | POA: Diagnosis not present

## 2021-01-07 DIAGNOSIS — Z9884 Bariatric surgery status: Secondary | ICD-10-CM | POA: Diagnosis not present

## 2021-01-07 LAB — CBC WITH DIFFERENTIAL (CANCER CENTER ONLY)
Abs Immature Granulocytes: 0.02 10*3/uL (ref 0.00–0.07)
Basophils Absolute: 0 10*3/uL (ref 0.0–0.1)
Basophils Relative: 0 %
Eosinophils Absolute: 0.1 10*3/uL (ref 0.0–0.5)
Eosinophils Relative: 2 %
HCT: 31.8 % — ABNORMAL LOW (ref 36.0–46.0)
Hemoglobin: 9.7 g/dL — ABNORMAL LOW (ref 12.0–15.0)
Immature Granulocytes: 0 %
Lymphocytes Relative: 44 %
Lymphs Abs: 2.3 10*3/uL (ref 0.7–4.0)
MCH: 18.2 pg — ABNORMAL LOW (ref 26.0–34.0)
MCHC: 30.5 g/dL (ref 30.0–36.0)
MCV: 59.7 fL — ABNORMAL LOW (ref 80.0–100.0)
Monocytes Absolute: 0.3 10*3/uL (ref 0.1–1.0)
Monocytes Relative: 6 %
Neutro Abs: 2.4 10*3/uL (ref 1.7–7.7)
Neutrophils Relative %: 48 %
Platelet Count: 243 10*3/uL (ref 150–400)
RBC: 5.33 MIL/uL — ABNORMAL HIGH (ref 3.87–5.11)
RDW: 16.4 % — ABNORMAL HIGH (ref 11.5–15.5)
WBC Count: 5.1 10*3/uL (ref 4.0–10.5)
nRBC: 0 % (ref 0.0–0.2)

## 2021-01-07 LAB — IRON AND TIBC
Iron: 80 ug/dL (ref 41–142)
Saturation Ratios: 31 % (ref 21–57)
TIBC: 256 ug/dL (ref 236–444)
UIBC: 176 ug/dL (ref 120–384)

## 2021-01-07 LAB — FERRITIN: Ferritin: 1984 ng/mL — ABNORMAL HIGH (ref 11–307)

## 2021-01-07 LAB — CMP (CANCER CENTER ONLY)
ALT: 8 U/L (ref 0–44)
AST: 9 U/L — ABNORMAL LOW (ref 15–41)
Albumin: 4.2 g/dL (ref 3.5–5.0)
Alkaline Phosphatase: 49 U/L (ref 38–126)
Anion gap: 5 (ref 5–15)
BUN: 11 mg/dL (ref 6–20)
CO2: 28 mmol/L (ref 22–32)
Calcium: 10.2 mg/dL (ref 8.9–10.3)
Chloride: 107 mmol/L (ref 98–111)
Creatinine: 0.69 mg/dL (ref 0.44–1.00)
GFR, Estimated: >60 mL/min (ref >60)
Glucose, Bld: 109 mg/dL — ABNORMAL HIGH (ref 70–99)
Potassium: 4.3 mmol/L (ref 3.5–5.1)
Sodium: 140 mmol/L (ref 135–145)
Total Bilirubin: 0.7 mg/dL (ref 0.3–1.2)
Total Protein: 6.7 g/dL (ref 6.5–8.1)

## 2021-01-07 LAB — RETICULOCYTES
Immature Retic Fract: 22.4 % — ABNORMAL HIGH (ref 2.3–15.9)
RBC.: 5.25 MIL/uL — ABNORMAL HIGH (ref 3.87–5.11)
Retic Count, Absolute: 141.8 10*3/uL (ref 19.0–186.0)
Retic Ct Pct: 2.7 % (ref 0.4–3.1)

## 2021-01-07 MED ORDER — TRAMADOL HCL 50 MG PO TABS: 50.0000 mg | ORAL_TABLET | Freq: Four times a day (QID) | ORAL | 0 refills | Status: DC | PRN

## 2021-01-07 NOTE — Telephone Encounter (Signed)
-----   Message from [name], MD sent at 01/07/2021  2:29 PM EDT ----- Please call her and let her know that the iron studies are okay.

## 2021-01-07 NOTE — Progress Notes (Signed)
Hematology and Oncology Follow Up Visit  Abigail Hall [id number] [birth year] 40 y.o. 01/07/2021   Principle Diagnosis:  Thalassemia minor (beta thalassemia) History of iron deficiency anemia  Current Therapy:   Folic acid 2 mg by mouth daily- patient stopped herself IV iron as indicated - feraheme given on 10/15/2019   Interim History:  Abigail Hall is here today for follow-up.  She is doing better because her boy is now in a new school.  He is doing much better in the new school.  This gives her a lot of peace.  She is looking for a new job.  She would like to work from home.  Hopefully she will be able to find 1.  Her right knee is bothering her.  We did do x-rays on it for years ago which showed some mild arthritis.  Arthritis does run in the family.  We will going get her another x-ray today.  We will try her on some Ultram to see if this might help.  Her iron studies look good last time.  Her iron saturation was 23%.  Her ferritin was 1555.  A light of the ferritin elevation is secondary to inflammatory changes.  She has had no cough.  There is no chest wall pain.  She has had no change in bowel or bladder habits.  There is been no rashes.  Overall, performance status is ECOG 1.    Medications:  Allergies as of 01/07/2021      Reactions   Cephalexin Hives   Latex Itching, Swelling, Rash      Medication List       Accurate as of Jan 07, 2021  9:53 AM. If you have any questions, ask your nurse or doctor.        STOP taking these medications   fluticasone 50 MCG/ACT nasal spray Commonly known as: FLONASE Stopped by: [name], MD   traZODone 50 MG tablet Commonly known as: DESYREL Stopped by: [name], MD   Trintellix 10 MG Tabs tablet Generic drug: vortioxetine HBr Stopped by: [name], MD   zolpidem 5 MG tablet Commonly known as: AMBIEN Stopped by: [name], MD     TAKE these medications   acetaminophen 325 MG  tablet Commonly known as: TYLENOL Take 2 tablets (650 mg total) by mouth every 6 (six) hours as needed for mild pain.   ALPRAZolam 1 MG tablet Commonly known as: XANAX Take 1 mg by mouth 3 (three) times daily as needed for anxiety.   Biotin 10000 MCG Tbdp Take 10,000 mcg by mouth daily.   divalproex 500 MG DR tablet Commonly known as: DEPAKOTE Take 1 tablet (500 mg total) by mouth 2 (two) times daily.   ELDERBERRY PO Take 1 tablet by mouth daily.   Fluocinolone Acetonide Scalp 0.01 % Oil APPLY EXTERNALLY TO THE AFFECTED AREA DAILY AS NEEDED What changed: See the new instructions.   ketoconazole 2 % shampoo Commonly known as: NIZORAL APPLY EXTERNALLY 2 TIMES A WEEK   multivitamin with minerals Tabs tablet Take 1 tablet by mouth daily.   polyethylene glycol powder 17 GM/SCOOP powder Commonly known as: GLYCOLAX/MIRALAX Take 1 Container by mouth daily as needed for mild constipation. 1 scoop as needed   vitamin C 500 MG tablet Commonly known as: ASCORBIC ACID Take 500 mg by mouth daily.   vitamin E 1000 UNIT capsule Take 1,000 Units by mouth daily.       Allergies:  Allergies  Allergen Reactions  .  Cephalexin Hives  . Latex Itching, Swelling and Rash    Past Medical History, Surgical history, Social history, and Family History were reviewed and updated.  Review of Systems: Review of Systems  Constitutional: Negative.   HENT: Negative.   Eyes: Negative.   Respiratory: Negative.   Cardiovascular: Negative.   Gastrointestinal: Negative.   Genitourinary: Negative.   Musculoskeletal: Negative.   Skin: Negative.   Neurological: Negative.   Endo/Heme/Allergies: Negative.   Psychiatric/Behavioral: Negative.      Physical Exam:  weight is 198 lb 12.8 oz (90.2 kg). Her oral temperature is 98.1 F (36.7 C). Her blood pressure is 102/63 and her pulse is 77. Her respiration is 20 and oxygen saturation is 100%.   Wt Readings from Last 3 Encounters:  01/07/21  198 lb 12.8 oz (90.2 kg)  11/19/20 204 lb 0.6 oz (92.6 kg)  09/12/20 200 lb 6.4 oz (90.9 kg)    Physical Exam Vitals reviewed.  HENT:     Head: Normocephalic and atraumatic.  Eyes:     Pupils: Pupils are equal, round, and reactive to light.  Cardiovascular:     Rate and Rhythm: Normal rate and regular rhythm.     Heart sounds: Normal heart sounds.  Pulmonary:     Effort: Pulmonary effort is normal.     Breath sounds: Normal breath sounds.  Abdominal:     General: Bowel sounds are normal.     Palpations: Abdomen is soft.     Comments: Abdominal exam shows healed laparoscopic wounds.  She has 3 of them.  There is no abdominal fluid.  There is no guarding or rebound tenderness.  There is no palpable liver or spleen tip.  Musculoskeletal:        General: No tenderness or deformity. Normal range of motion.     Cervical back: Normal range of motion.  Lymphadenopathy:     Cervical: No cervical adenopathy.  Skin:    General: Skin is warm and dry.     Findings: No erythema or rash.  Neurological:     Mental Status: She is alert and oriented to person, place, and time.  Psychiatric:        Behavior: Behavior normal.        Thought Content: Thought content normal.        Judgment: Judgment normal.      Lab Results  Component Value Date   WBC 5.1 01/07/2021   HGB 9.7 (L) 01/07/2021   HCT 31.8 (L) 01/07/2021   MCV 59.7 (L) 01/07/2021   PLT 243 01/07/2021   Lab Results  Component Value Date   FERRITIN 1,555 (H) 11/19/2020   IRON 62 11/19/2020   TIBC 270 11/19/2020   UIBC 208 11/19/2020   IRONPCTSAT 23 11/19/2020   Lab Results  Component Value Date   RETICCTPCT 2.7 01/07/2021   RBC 5.33 (H) 01/07/2021   RBC 5.25 (H) 01/07/2021   RETICCTABS 202.8 (H) 05/24/2014   No results found for: KPAFRELGTCHN, LAMBDASER, KAPLAMBRATIO No results found for: IGGSERUM, IGA, IGMSERUM No results found for: Odetta Pink, SPEI    Chemistry      Component Value Date/Time   NA 140 01/07/2021 0842   NA 145 08/04/2017 0829   K 4.3 01/07/2021 0842   K 4.0 08/04/2017 0829   CL 107 01/07/2021 0842   CL 108 08/04/2017 0829   CO2 28 01/07/2021 0842   CO2 26 08/04/2017 0829   BUN 11 01/07/2021 0842  BUN 11 08/04/2017 0829   CREATININE 0.69 01/07/2021 0842   CREATININE 0.8 08/04/2017 0829      Component Value Date/Time   CALCIUM 10.2 01/07/2021 0842   CALCIUM 9.4 08/04/2017 0829   ALKPHOS 49 01/07/2021 0842   ALKPHOS 63 08/04/2017 0829   AST 9 (L) 01/07/2021 0842   ALT 8 01/07/2021 0842   ALT 14 08/04/2017 0829   BILITOT 0.7 01/07/2021 0842       Impression and Plan: Abigail Hall is a very pleasant43 yo [name] withbeta thalassemia and 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  We will see what her iron levels are.  I still have thought about using Luspatercept on her.  However, we we will continue to hold off on this.   We will have to see what the x-rays show of the right knee.  Again I do not think this is anything related to the thalassemia.  We will try to get her back now in about 6 weeks.   [name], MD 5/25/20229:53 AM

## 2021-01-07 NOTE — Telephone Encounter (Signed)
Called and informed patient of lab results, patient verbalized understanding and denies any questions or concerns at this time.   

## 2021-01-09 LAB — HGB FRACTIONATION CASCADE
Hgb A2: 5 % — ABNORMAL HIGH (ref 1.8–3.2)
Hgb A: 92.9 % — ABNORMAL LOW (ref 96.4–98.8)
Hgb F: 2.1 % — ABNORMAL HIGH (ref 0.0–2.0)
Hgb S: 0 %

## 2021-02-04 ENCOUNTER — Other Ambulatory Visit: Payer: Self-pay | Admitting: Hematology & Oncology

## 2021-02-19 ENCOUNTER — Inpatient Hospital Stay: Payer: BC Managed Care – PPO

## 2021-02-22 ENCOUNTER — Emergency Department (HOSPITAL_COMMUNITY): Payer: BC Managed Care – PPO

## 2021-02-22 ENCOUNTER — Inpatient Hospital Stay (HOSPITAL_COMMUNITY)
Admission: EM | Admit: 2021-02-22 | Discharge: 2021-02-23 | DRG: 084 | Disposition: A | Discharge status: Home / Self Care | Payer: BC Managed Care – PPO | Attending: General Surgery | Admitting: General Surgery

## 2021-02-22 ENCOUNTER — Other Ambulatory Visit: Payer: Self-pay

## 2021-02-22 ENCOUNTER — Encounter (HOSPITAL_COMMUNITY): Payer: Self-pay

## 2021-02-22 DIAGNOSIS — Z8349 Family history of other endocrine, nutritional and metabolic diseases: Secondary | ICD-10-CM | POA: Diagnosis not present

## 2021-02-22 DIAGNOSIS — S0181XA Laceration without foreign body of other part of head, initial encounter: Secondary | ICD-10-CM | POA: Diagnosis present

## 2021-02-22 DIAGNOSIS — Z20822 Contact with and (suspected) exposure to covid-19: Secondary | ICD-10-CM | POA: Diagnosis present

## 2021-02-22 DIAGNOSIS — Z833 Family history of diabetes mellitus: Secondary | ICD-10-CM | POA: Diagnosis not present

## 2021-02-22 DIAGNOSIS — F10129 Alcohol abuse with intoxication, unspecified: Secondary | ICD-10-CM | POA: Diagnosis present

## 2021-02-22 DIAGNOSIS — Y92410 Unspecified street and highway as the place of occurrence of the external cause: Secondary | ICD-10-CM | POA: Diagnosis not present

## 2021-02-22 DIAGNOSIS — J9600 Acute respiratory failure, unspecified whether with hypoxia or hypercapnia: Secondary | ICD-10-CM

## 2021-02-22 DIAGNOSIS — S069X9A Unspecified intracranial injury with loss of consciousness of unspecified duration, initial encounter: Secondary | ICD-10-CM | POA: Diagnosis present

## 2021-02-22 DIAGNOSIS — S8001XA Contusion of right knee, initial encounter: Secondary | ICD-10-CM | POA: Diagnosis present

## 2021-02-22 DIAGNOSIS — R402212 Coma scale, best verbal response, none, at arrival to emergency department: Secondary | ICD-10-CM | POA: Diagnosis present

## 2021-02-22 DIAGNOSIS — S40021A Contusion of right upper arm, initial encounter: Secondary | ICD-10-CM | POA: Diagnosis present

## 2021-02-22 DIAGNOSIS — D569 Thalassemia, unspecified: Secondary | ICD-10-CM | POA: Diagnosis present

## 2021-02-22 DIAGNOSIS — D509 Iron deficiency anemia, unspecified: Secondary | ICD-10-CM | POA: Diagnosis present

## 2021-02-22 DIAGNOSIS — Y906 Blood alcohol level of 120-199 mg/100 ml: Secondary | ICD-10-CM | POA: Diagnosis present

## 2021-02-22 DIAGNOSIS — S069X1A Unspecified intracranial injury with loss of consciousness of 30 minutes or less, initial encounter: Secondary | ICD-10-CM | POA: Diagnosis not present

## 2021-02-22 DIAGNOSIS — R402112 Coma scale, eyes open, never, at arrival to emergency department: Secondary | ICD-10-CM | POA: Diagnosis present

## 2021-02-22 DIAGNOSIS — Z809 Family history of malignant neoplasm, unspecified: Secondary | ICD-10-CM

## 2021-02-22 DIAGNOSIS — R402312 Coma scale, best motor response, none, at arrival to emergency department: Secondary | ICD-10-CM | POA: Diagnosis present

## 2021-02-22 DIAGNOSIS — S40022A Contusion of left upper arm, initial encounter: Secondary | ICD-10-CM | POA: Diagnosis present

## 2021-02-22 DIAGNOSIS — S069XAA Unspecified intracranial injury with loss of consciousness status unknown, initial encounter: Secondary | ICD-10-CM | POA: Diagnosis present

## 2021-02-22 DIAGNOSIS — G40909 Epilepsy, unspecified, not intractable, without status epilepticus: Secondary | ICD-10-CM | POA: Diagnosis present

## 2021-02-22 HISTORY — DX: Unspecified convulsions: R56.9

## 2021-02-22 LAB — I-STAT CHEM 8, ED
BUN: 13 mg/dL (ref 6–20)
Calcium, Ion: 1.09 mmol/L — ABNORMAL LOW (ref 1.15–1.40)
Chloride: 108 mmol/L (ref 98–111)
Creatinine, Ser: 0.9 mg/dL (ref 0.44–1.00)
Glucose, Bld: 163 mg/dL — ABNORMAL HIGH (ref 70–99)
HCT: 33 % — ABNORMAL LOW (ref 36.0–46.0)
Hemoglobin: 11.2 g/dL — ABNORMAL LOW (ref 12.0–15.0)
Potassium: 3.8 mmol/L (ref 3.5–5.1)
Sodium: 142 mmol/L (ref 135–145)
TCO2: 22 mmol/L (ref 22–32)

## 2021-02-22 LAB — COMPREHENSIVE METABOLIC PANEL
ALT: 11 U/L (ref 0–44)
ALT: 12 U/L (ref 0–44)
AST: 14 U/L — ABNORMAL LOW (ref 15–41)
AST: 16 U/L (ref 15–41)
Albumin: 3.5 g/dL (ref 3.5–5.0)
Albumin: 3.4 g/dL — ABNORMAL LOW (ref 3.5–5.0)
Alkaline Phosphatase: 52 U/L (ref 38–126)
Alkaline Phosphatase: 49 U/L (ref 38–126)
Anion gap: 10 (ref 5–15)
Anion gap: 12 (ref 5–15)
BUN: 13 mg/dL (ref 6–20)
BUN: 8 mg/dL (ref 6–20)
CO2: 21 mmol/L — ABNORMAL LOW (ref 22–32)
CO2: 19 mmol/L — ABNORMAL LOW (ref 22–32)
Calcium: 8.7 mg/dL — ABNORMAL LOW (ref 8.9–10.3)
Calcium: 8.3 mg/dL — ABNORMAL LOW (ref 8.9–10.3)
Chloride: 109 mmol/L (ref 98–111)
Chloride: 109 mmol/L (ref 98–111)
Creatinine, Ser: 0.68 mg/dL (ref 0.44–1.00)
Creatinine, Ser: 0.51 mg/dL (ref 0.44–1.00)
GFR, Estimated: 59 mL/min — ABNORMAL LOW (ref >60)
GFR, Estimated: >60 mL/min (ref >60)
Glucose, Bld: 172 mg/dL — ABNORMAL HIGH (ref 70–99)
Glucose, Bld: 108 mg/dL — ABNORMAL HIGH (ref 70–99)
Potassium: 3.6 mmol/L (ref 3.5–5.1)
Potassium: 3.6 mmol/L (ref 3.5–5.1)
Sodium: 140 mmol/L (ref 135–145)
Sodium: 140 mmol/L (ref 135–145)
Total Bilirubin: 0.7 mg/dL (ref 0.3–1.2)
Total Bilirubin: 0.7 mg/dL (ref 0.3–1.2)
Total Protein: 6.3 g/dL — ABNORMAL LOW (ref 6.5–8.1)
Total Protein: 6 g/dL — ABNORMAL LOW (ref 6.5–8.1)

## 2021-02-22 LAB — I-STAT ARTERIAL BLOOD GAS, ED
Acid-base deficit: 4 mmol/L — ABNORMAL HIGH (ref 0.0–2.0)
Bicarbonate: 20.5 mmol/L (ref 20.0–28.0)
Calcium, Ion: 1.1 mmol/L — ABNORMAL LOW (ref 1.15–1.40)
HCT: 26 % — ABNORMAL LOW (ref 36.0–46.0)
Hemoglobin: 8.8 g/dL — ABNORMAL LOW (ref 12.0–15.0)
O2 Saturation: 100 %
Patient temperature: 97
Potassium: 3.3 mmol/L — ABNORMAL LOW (ref 3.5–5.1)
Sodium: 142 mmol/L (ref 135–145)
TCO2: 22 mmol/L (ref 22–32)
pCO2 arterial: 34.9 mmHg (ref 32.0–48.0)
pH, Arterial: 7.373 (ref 7.350–7.450)
pO2, Arterial: 391 mmHg — ABNORMAL HIGH (ref 83.0–108.0)

## 2021-02-22 LAB — RESP PANEL BY RT-PCR (FLU A&B, COVID) ARPGX2
Influenza A by PCR: NEGATIVE
Influenza B by PCR: NEGATIVE
SARS Coronavirus 2 by RT PCR: NEGATIVE

## 2021-02-22 LAB — I-STAT BETA HCG BLOOD, ED (MC, WL, AP ONLY): I-stat hCG, quantitative: <5 m[IU]/mL (ref <5)

## 2021-02-22 LAB — CBC
HCT: 30.8 % — ABNORMAL LOW (ref 36.0–46.0)
HCT: 30.9 % — ABNORMAL LOW (ref 36.0–46.0)
Hemoglobin: 9.1 g/dL — ABNORMAL LOW (ref 12.0–15.0)
Hemoglobin: 9.2 g/dL — ABNORMAL LOW (ref 12.0–15.0)
MCH: 18.5 pg — ABNORMAL LOW (ref 26.0–34.0)
MCH: 18.4 pg — ABNORMAL LOW (ref 26.0–34.0)
MCHC: 29.5 g/dL — ABNORMAL LOW (ref 30.0–36.0)
MCHC: 29.8 g/dL — ABNORMAL LOW (ref 30.0–36.0)
MCV: 62.7 fL — ABNORMAL LOW (ref 80.0–100.0)
MCV: 61.9 fL — ABNORMAL LOW (ref 80.0–100.0)
Platelets: 260 10*3/uL (ref 150–400)
Platelets: 249 10*3/uL (ref 150–400)
RBC: 4.91 MIL/uL (ref 3.87–5.11)
RBC: 4.99 MIL/uL (ref 3.87–5.11)
RDW: 17.5 % — ABNORMAL HIGH (ref 11.5–15.5)
RDW: 17.2 % — ABNORMAL HIGH (ref 11.5–15.5)
WBC: 7.6 10*3/uL (ref 4.0–10.5)
WBC: 9.6 10*3/uL (ref 4.0–10.5)
nRBC: 0.8 % — ABNORMAL HIGH (ref 0.0–0.2)
nRBC: 0.3 % — ABNORMAL HIGH (ref 0.0–0.2)

## 2021-02-22 LAB — MAGNESIUM: Magnesium: 1.8 mg/dL (ref 1.7–2.4)

## 2021-02-22 LAB — VALPROIC ACID LEVEL: Valproic Acid Lvl: 16 ug/mL — ABNORMAL LOW (ref 50.0–100.0)

## 2021-02-22 LAB — SAMPLE TO BLOOD BANK

## 2021-02-22 LAB — PHOSPHORUS: Phosphorus: 3.3 mg/dL (ref 2.5–4.6)

## 2021-02-22 LAB — MRSA NEXT GEN BY PCR, NASAL: MRSA by PCR Next Gen: NOT DETECTED

## 2021-02-22 LAB — ETHANOL: Alcohol, Ethyl (B): 184 mg/dL — ABNORMAL HIGH (ref <10)

## 2021-02-22 LAB — PROTIME-INR
INR: 1 (ref 0.8–1.2)
Prothrombin Time: 12.8 seconds (ref 11.4–15.2)

## 2021-02-22 LAB — LACTIC ACID, PLASMA: Lactic Acid, Venous: 3.5 mmol/L (ref 0.5–1.9)

## 2021-02-22 MED ORDER — BACITRACIN-NEOMYCIN-POLYMYXIN OINTMENT TUBE
TOPICAL_OINTMENT | CUTANEOUS | Status: DC | PRN
  Filled 2021-02-22: qty 14

## 2021-02-22 MED ORDER — CHLORHEXIDINE GLUCONATE CLOTH 2 % EX PADS: 6.0000 | MEDICATED_PAD | Freq: Every day | CUTANEOUS | Status: DC

## 2021-02-22 MED ORDER — DIVALPROEX SODIUM 500 MG PO DR TAB
500.0000 mg | DELAYED_RELEASE_TABLET | Freq: Two times a day (BID) | ORAL | Status: DC
  Administered 2021-02-22 – 2021-02-23 (×3): 500 mg via ORAL
  Filled 2021-02-22 (×4): qty 1

## 2021-02-22 MED ORDER — PROPOFOL 1000 MG/100ML IV EMUL
0.0000 ug/kg/min | INTRAVENOUS | Status: DC
  Administered 2021-02-22: 5 ug/kg/min via INTRAVENOUS
  Filled 2021-02-22: qty 100

## 2021-02-22 MED ORDER — OXYCODONE HCL 5 MG/5ML PO SOLN: 5.0000 mg | ORAL | Status: DC | PRN

## 2021-02-22 MED ORDER — ROCURONIUM BROMIDE 50 MG/5ML IV SOLN
INTRAVENOUS | Status: AC | PRN
  Administered 2021-02-22: 100 mg via INTRAVENOUS

## 2021-02-22 MED ORDER — ORAL CARE MOUTH RINSE: 15.0000 mL | Freq: Two times a day (BID) | OROMUCOSAL | Status: DC

## 2021-02-22 MED ORDER — ONDANSETRON HCL 4 MG/2ML IJ SOLN
4.0000 mg | Freq: Four times a day (QID) | INTRAMUSCULAR | Status: DC | PRN
  Administered 2021-02-22 – 2021-02-23 (×3): 4 mg via INTRAVENOUS
  Filled 2021-02-22 (×4): qty 2

## 2021-02-22 MED ORDER — FENTANYL 2500MCG IN NS 250ML (10MCG/ML) PREMIX INFUSION
0.0000 ug/h | INTRAVENOUS | Status: AC
  Administered 2021-02-22: 50 ug/h via INTRAVENOUS
  Filled 2021-02-22: qty 250

## 2021-02-22 MED ORDER — ADULT MULTIVITAMIN W/MINERALS CH
1.0000 | ORAL_TABLET | Freq: Every day | ORAL | Status: DC
  Administered 2021-02-23: 1 via ORAL
  Filled 2021-02-22: qty 1

## 2021-02-22 MED ORDER — FOLIC ACID 1 MG PO TABS
1.0000 mg | ORAL_TABLET | Freq: Every day | ORAL | Status: DC
  Administered 2021-02-23: 1 mg via ORAL
  Filled 2021-02-22: qty 1

## 2021-02-22 MED ORDER — ONDANSETRON 4 MG PO TBDP
4.0000 mg | ORAL_TABLET | Freq: Four times a day (QID) | ORAL | Status: DC | PRN
  Administered 2021-02-23: 4 mg via ORAL
  Filled 2021-02-22: qty 1

## 2021-02-22 MED ORDER — MORPHINE SULFATE (PF) 4 MG/ML IV SOLN
4.0000 mg | INTRAVENOUS | Status: DC | PRN
  Filled 2021-02-22: qty 1

## 2021-02-22 MED ORDER — ETOMIDATE 2 MG/ML IV SOLN
INTRAVENOUS | Status: AC | PRN
  Administered 2021-02-22: 20 mg via INTRAVENOUS

## 2021-02-22 MED ORDER — SODIUM CHLORIDE 0.9 % IV SOLN
INTRAVENOUS | Status: AC | PRN
  Administered 2021-02-22: 1000 mL via INTRAVENOUS

## 2021-02-22 MED ORDER — ACETAMINOPHEN 500 MG PO TABS
1000.0000 mg | ORAL_TABLET | Freq: Four times a day (QID) | ORAL | Status: DC
  Administered 2021-02-22 – 2021-02-23 (×4): 1000 mg via ORAL
  Filled 2021-02-22 (×4): qty 2

## 2021-02-22 MED ORDER — FENTANYL 2500MCG IN NS 250ML (10MCG/ML) PREMIX INFUSION
50.0000 ug/h | INTRAVENOUS | Status: DC
  Administered 2021-02-22: 100 ug/h via INTRAVENOUS

## 2021-02-22 MED ORDER — LORAZEPAM 1 MG PO TABS: 1.0000 mg | ORAL_TABLET | ORAL | Status: DC | PRN

## 2021-02-22 MED ORDER — VALPROATE SODIUM 100 MG/ML IV SOLN
500.0000 mg | Freq: Once | INTRAVENOUS | Status: AC
  Administered 2021-02-22: 500 mg via INTRAVENOUS
  Filled 2021-02-22: qty 5

## 2021-02-22 MED ORDER — LORAZEPAM 2 MG/ML IJ SOLN: 1.0000 mg | INTRAMUSCULAR | Status: DC | PRN

## 2021-02-22 MED ORDER — POLYETHYLENE GLYCOL 3350 17 G PO PACK: 17.0000 g | PACK | Freq: Every day | ORAL | Status: DC

## 2021-02-22 MED ORDER — SODIUM CHLORIDE 0.9 % IV SOLN: INTRAVENOUS | Status: DC

## 2021-02-22 MED ORDER — DOCUSATE SODIUM 100 MG PO CAPS
100.0000 mg | ORAL_CAPSULE | Freq: Two times a day (BID) | ORAL | Status: DC
  Administered 2021-02-22 – 2021-02-23 (×2): 100 mg via ORAL
  Filled 2021-02-22 (×2): qty 1

## 2021-02-22 MED ORDER — FENTANYL CITRATE (PF) 100 MCG/2ML IJ SOLN: 50.0000 ug | Freq: Once | INTRAMUSCULAR | Status: DC

## 2021-02-22 MED ORDER — IOHEXOL 350 MG/ML SOLN
100.0000 mL | Freq: Once | INTRAVENOUS | Status: AC | PRN
  Administered 2021-02-22: 100 mL via INTRAVENOUS

## 2021-02-22 MED ORDER — THIAMINE HCL 100 MG PO TABS
100.0000 mg | ORAL_TABLET | Freq: Every day | ORAL | Status: DC
  Administered 2021-02-23: 100 mg via ORAL
  Filled 2021-02-22: qty 1

## 2021-02-22 MED ORDER — OXYCODONE HCL 5 MG PO TABS
5.0000 mg | ORAL_TABLET | ORAL | Status: DC | PRN
  Administered 2021-02-22 – 2021-02-23 (×4): 10 mg via ORAL
  Filled 2021-02-22 (×4): qty 2

## 2021-02-22 MED ORDER — THIAMINE HCL 100 MG/ML IJ SOLN: 100.0000 mg | Freq: Every day | INTRAMUSCULAR | Status: DC

## 2021-02-22 MED ORDER — DOCUSATE SODIUM 50 MG/5ML PO LIQD: 100.0000 mg | Freq: Two times a day (BID) | ORAL | Status: DC

## 2021-02-22 MED ORDER — FENTANYL BOLUS VIA INFUSION
50.0000 ug | INTRAVENOUS | Status: DC | PRN
Start: 2021-02-22 — End: 2021-02-22
  Filled 2021-02-22: qty 100

## 2021-02-22 NOTE — Progress Notes (Signed)
PT Cancellation Note  Patient Details Name: Abigail Hall MRN: [id number] DOB: [birth year]   Cancelled Treatment:    Reason Eval/Treat Not Completed: Patient not medically ready  Will follow;   Roney Marion, [place] Pager [phone] Office [phone]   Colletta [place] 02/22/2021, 8:48 AM
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
## 2021-02-22 NOTE — ED Notes (Signed)
Pt was successfully extubated

## 2021-02-22 NOTE — ED Notes (Signed)
Pt transported to CT ?
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
## 2021-02-22 NOTE — ED Notes (Signed)
[place] J placed 

## 2021-02-22 NOTE — Progress Notes (Signed)
   02/22/21 0719  Airway 7.5 mm  Placement Date/Time: 02/22/21 0600   Placed By: ED Physician  Airway Device: Endotracheal Tube  ETT Types: Oral  Size (mm): 7.5 mm  Secured at (cm): 22 cm  Secured at (cm) 22 cm  Measured From [place] By Commercial Tube Holder  Tube Holder Repositioned Yes  Prone position No  Cuff Pressure (cm H2O) Clear OR 27-39 CmH2O  Site Condition Dry  Adult Ventilator Settings  Vent Type Servo i  Humidity HME  Vent Mode PSV;CPAP  FiO2 (%) 40 %  Pressure Support 8 cmH20  PEEP 5 cmH20  Adult Ventilator Measurements  Peak Airway Pressure 13 L/min  Mean Airway Pressure 8 cmH20  Resp Rate Spontaneous 14 br/min  Resp Rate Total 14 br/min  Spont TV 541 mL  Measured Ve 6.1 mL  Auto PEEP 0 cmH20  Total PEEP 5 cmH20  Adult Ventilator Alarms  Alarms On Y  Ve High Alarm 25 L/min  Ve Low Alarm 4 L/min  Resp Rate High Alarm 35 br/min  Resp Rate Low Alarm 8  PEEP Low Alarm 2 cmH2O  Press High Alarm 40 cmH2O  T Apnea 20 sec(s)  Daily Weaning Assessment  Daily Assessment of Readiness to Wean Wean protocol criteria met (SBT performed)  SBT Method CPAP 5 cm H20 and PS 5 cm H20 (PS 8)  Weaning Start Time 0710  Breath Sounds  Bilateral Breath Sounds Diminished  [place] Agitation Sedation Scale  [place] Agitation Sedation Scale (RASS) 0

## 2021-02-22 NOTE — ED Notes (Signed)
55 F OG placed.

## 2021-02-22 NOTE — Progress Notes (Signed)
OT Cancellation Note  Patient Details Name: Abigail Hall MRN: [id number] DOB: [birth year]   Cancelled Treatment:    Reason Eval/Treat Not Completed: Patient not medically ready [name]., OTR/L Acute Rehabilitation Services Pager [phone] Office [phone]  [name] 02/22/2021, 7:59 AM

## 2021-02-22 NOTE — Procedures (Signed)
Extubation Procedure Note  Patient Details:   Name: Abigail Hall DOB: March 18, 1981 MRN: [id number]   Airway Documentation:    Vent end date: 02/22/21 Vent end time: 0948   Evaluation  O2 sats: stable throughout Complications: No apparent complications Patient did tolerate procedure well. Bilateral Breath Sounds: Diminished  Pt able to speak: Yes  Rudene Re 02/22/2021, 10:26 AM
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
## 2021-02-23 ENCOUNTER — Ambulatory Visit: Payer: BC Managed Care – PPO | Admitting: Hematology & Oncology

## 2021-02-23 ENCOUNTER — Telehealth: Payer: Self-pay

## 2021-02-23 ENCOUNTER — Inpatient Hospital Stay (HOSPITAL_COMMUNITY): Payer: BC Managed Care – PPO

## 2021-02-23 ENCOUNTER — Other Ambulatory Visit: Payer: BC Managed Care – PPO

## 2021-02-23 LAB — RAPID URINE DRUG SCREEN, HOSP PERFORMED
Amphetamines: NOT DETECTED
Barbiturates: NOT DETECTED
Benzodiazepines: NOT DETECTED
Cocaine: NOT DETECTED
Opiates: POSITIVE — AB
Tetrahydrocannabinol: NOT DETECTED

## 2021-02-23 LAB — CBC
HCT: 26.1 % — ABNORMAL LOW (ref 36.0–46.0)
Hemoglobin: 8 g/dL — ABNORMAL LOW (ref 12.0–15.0)
MCH: 18.6 pg — ABNORMAL LOW (ref 26.0–34.0)
MCHC: 30.7 g/dL (ref 30.0–36.0)
MCV: 60.6 fL — ABNORMAL LOW (ref 80.0–100.0)
Platelets: 210 10*3/uL (ref 150–400)
RBC: 4.31 MIL/uL (ref 3.87–5.11)
RDW: 16.8 % — ABNORMAL HIGH (ref 11.5–15.5)
WBC: 9.4 10*3/uL (ref 4.0–10.5)
nRBC: 0.5 % — ABNORMAL HIGH (ref 0.0–0.2)

## 2021-02-23 LAB — BASIC METABOLIC PANEL
Anion gap: 6 (ref 5–15)
BUN: 9 mg/dL (ref 6–20)
CO2: 24 mmol/L (ref 22–32)
Calcium: 8.4 mg/dL — ABNORMAL LOW (ref 8.9–10.3)
Chloride: 108 mmol/L (ref 98–111)
Creatinine, Ser: 0.63 mg/dL (ref 0.44–1.00)
GFR, Estimated: >60 mL/min (ref >60)
Glucose, Bld: 91 mg/dL (ref 70–99)
Potassium: 3.8 mmol/L (ref 3.5–5.1)
Sodium: 138 mmol/L (ref 135–145)

## 2021-02-23 LAB — VITAMIN B12: Vitamin B-12: 428 pg/mL (ref 180–914)

## 2021-02-23 MED ORDER — ACETAMINOPHEN 500 MG PO TABS
1000.0000 mg | ORAL_TABLET | Freq: Four times a day (QID) | ORAL | 0 refills | Status: AC | PRN
Start: 2021-02-23 — End: ?

## 2021-02-23 MED ORDER — ONDANSETRON HCL 4 MG PO TABS: 4.0000 mg | ORAL_TABLET | Freq: Every day | ORAL | 1 refills | Status: AC | PRN

## 2021-02-23 MED ORDER — TRAMADOL HCL 50 MG PO TABS: 50.0000 mg | ORAL_TABLET | Freq: Four times a day (QID) | ORAL | 0 refills | Status: DC | PRN

## 2021-02-23 NOTE — Progress Notes (Signed)
Pt given discharge instructions, pt verbalizes understanding of instructions including prescriptions. Vital signs within normal limits at discharge, A/O x4. Pt leaves with mother by private vehicle.
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
## 2021-02-23 NOTE — Discharge Instructions (Signed)
Standard seizure precautions reviewed with patient and to be included in discharge instructions: Per [place] statutes, patients with seizures are not allowed to drive until  they have been seizure-free for six months. Use caution when using heavy equipment or power tools. Avoid working on ladders or at heights. Take showers instead of baths. Ensure the water temperature is not too high on the home water heater. Do not go swimming alone. When caring for infants or small children, sit down when holding, feeding, or changing them to minimize risk of injury to the child in the event you have a seizure.  To reduce risk of seizures, maintain good sleep hygiene avoid alcohol and illicit drug use, take all anti-seizure medications as prescribed.

## 2021-02-23 NOTE — TOC CAGE-AID Note (Signed)
Transition of Care [facility]) - CAGE-AID Screening   Patient Details  Name: Abigail Hall MRN: [id number] Date of Birth: [birth year]  Transition of Care [facility]) CM/SW Contact:    Gaetano Hawthorne Tarpley-Carter, [place] Phone Number: 02/23/2021, 2:38 PM   Clinical Narrative: Pt participated in [place].  Pt stated they do not use alcohol or substance.  Pt was offered resources.  Pt stated they did not feel that they were in need of resources at this time.   Teigan Manner Tarpley-Carter, MSW, LCSW-A Pronouns:  She/Her/Hers                          [place] Clinical Social WorkerTransitions of Care Cell:  [phone] [email or web address]   CAGE-AID Screening:    Have You Ever Felt You Ought to Cut Down on Your Drinking or Drug Use?: No Have People Annoyed You By [employer] Your Drinking Or Drug Use?: No Have You Felt Bad Or Guilty About Your Drinking Or Drug Use?: No Have You Ever Had a Drink or Used Drugs First Thing In The Morning to Steady Your Nerves or to Get Rid of a Hangover?: No CAGE-AID Score: 0  Substance Abuse Education Offered: Yes

## 2021-02-23 NOTE — Plan of Care (Signed)
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
## 2021-02-24 ENCOUNTER — Encounter: Payer: Self-pay | Admitting: Family

## 2021-02-24 ENCOUNTER — Encounter: Payer: Self-pay | Admitting: Hematology & Oncology

## 2021-02-25 ENCOUNTER — Telehealth: Payer: Self-pay | Admitting: *Deleted

## 2021-02-25 NOTE — Telephone Encounter (Signed)
Call received from patient requesting to have lab work done earlier than 03/09/21.  Dr. Marin Olp notified and order received for pt to come in next week for labs and to see Dr. Marin Olp on 03/09/21.  Message sent to scheduling.

## 2021-03-02 ENCOUNTER — Other Ambulatory Visit: Payer: Self-pay

## 2021-03-02 ENCOUNTER — Inpatient Hospital Stay: Payer: BC Managed Care – PPO | Attending: Family

## 2021-03-02 DIAGNOSIS — G40909 Epilepsy, unspecified, not intractable, without status epilepticus: Secondary | ICD-10-CM | POA: Insufficient documentation

## 2021-03-02 DIAGNOSIS — D509 Iron deficiency anemia, unspecified: Secondary | ICD-10-CM | POA: Diagnosis not present

## 2021-03-02 DIAGNOSIS — D563 Thalassemia minor: Secondary | ICD-10-CM | POA: Diagnosis present

## 2021-03-02 DIAGNOSIS — Z79899 Other long term (current) drug therapy: Secondary | ICD-10-CM | POA: Insufficient documentation

## 2021-03-02 LAB — CBC WITH DIFFERENTIAL (CANCER CENTER ONLY)
Abs Immature Granulocytes: 0.04 10*3/uL (ref 0.00–0.07)
Basophils Absolute: 0 10*3/uL (ref 0.0–0.1)
Basophils Relative: 1 %
Eosinophils Absolute: 0.1 10*3/uL (ref 0.0–0.5)
Eosinophils Relative: 2 %
HCT: 31.3 % — ABNORMAL LOW (ref 36.0–46.0)
Hemoglobin: 9.3 g/dL — ABNORMAL LOW (ref 12.0–15.0)
Immature Granulocytes: 1 %
Lymphocytes Relative: 39 %
Lymphs Abs: 2.3 10*3/uL (ref 0.7–4.0)
MCH: 18.1 pg — ABNORMAL LOW (ref 26.0–34.0)
MCHC: 29.7 g/dL — ABNORMAL LOW (ref 30.0–36.0)
MCV: 60.9 fL — ABNORMAL LOW (ref 80.0–100.0)
Monocytes Absolute: 0.4 10*3/uL (ref 0.1–1.0)
Monocytes Relative: 7 %
Neutro Abs: 3.1 10*3/uL (ref 1.7–7.7)
Neutrophils Relative %: 50 %
Platelet Count: 307 10*3/uL (ref 150–400)
RBC: 5.14 MIL/uL — ABNORMAL HIGH (ref 3.87–5.11)
RDW: 16.3 % — ABNORMAL HIGH (ref 11.5–15.5)
WBC Count: 6 10*3/uL (ref 4.0–10.5)
nRBC: 0.3 % — ABNORMAL HIGH (ref 0.0–0.2)

## 2021-03-02 LAB — RETICULOCYTES
Immature Retic Fract: 27.7 % — ABNORMAL HIGH (ref 2.3–15.9)
RBC.: 5.04 MIL/uL (ref 3.87–5.11)
Retic Count, Absolute: 134.6 10*3/uL (ref 19.0–186.0)
Retic Ct Pct: 2.7 % (ref 0.4–3.1)

## 2021-03-02 LAB — CMP (CANCER CENTER ONLY)
ALT: 9 U/L (ref 0–44)
AST: 9 U/L — ABNORMAL LOW (ref 15–41)
Albumin: 4.2 g/dL (ref 3.5–5.0)
Alkaline Phosphatase: 54 U/L (ref 38–126)
Anion gap: 9 (ref 5–15)
BUN: 11 mg/dL (ref 6–20)
CO2: 27 mmol/L (ref 22–32)
Calcium: 9.6 mg/dL (ref 8.9–10.3)
Chloride: 102 mmol/L (ref 98–111)
Creatinine: 0.74 mg/dL (ref 0.44–1.00)
GFR, Estimated: >60 mL/min (ref >60)
Glucose, Bld: 95 mg/dL (ref 70–99)
Potassium: 4.2 mmol/L (ref 3.5–5.1)
Sodium: 138 mmol/L (ref 135–145)
Total Bilirubin: 0.6 mg/dL (ref 0.3–1.2)
Total Protein: 7.3 g/dL (ref 6.5–8.1)

## 2021-03-02 LAB — LACTATE DEHYDROGENASE: LDH: 134 U/L (ref 98–192)

## 2021-03-02 LAB — IRON AND TIBC
Iron: 75 ug/dL (ref 41–142)
Saturation Ratios: 27 % (ref 21–57)
TIBC: 274 ug/dL (ref 236–444)
UIBC: 199 ug/dL (ref 120–384)

## 2021-03-02 LAB — FERRITIN: Ferritin: 2443 ng/mL — ABNORMAL HIGH (ref 11–307)

## 2021-03-05 ENCOUNTER — Ambulatory Visit: Payer: BC Managed Care – PPO | Admitting: Neurology

## 2021-03-05 ENCOUNTER — Other Ambulatory Visit: Payer: Self-pay

## 2021-03-05 VITALS — BP 122/84 | HR 88 | Ht 64.0 in | Wt 194.5 lb

## 2021-03-05 DIAGNOSIS — Z79899 Other long term (current) drug therapy: Secondary | ICD-10-CM

## 2021-03-05 DIAGNOSIS — G40309 Generalized idiopathic epilepsy and epileptic syndromes, not intractable, without status epilepticus: Secondary | ICD-10-CM | POA: Diagnosis not present

## 2021-03-05 DIAGNOSIS — G40919 Epilepsy, unspecified, intractable, without status epilepticus: Secondary | ICD-10-CM

## 2021-03-05 DIAGNOSIS — G40001 Localization-related (focal) (partial) idiopathic epilepsy and epileptic syndromes with seizures of localized onset, not intractable, with status epilepticus: Secondary | ICD-10-CM | POA: Diagnosis not present

## 2021-03-05 MED ORDER — DEPAKOTE ER 500 MG PO TB24: 1000.0000 mg | ORAL_TABLET | Freq: Every day | ORAL | 4 refills | Status: DC

## 2021-03-05 NOTE — Patient Instructions (Signed)
Meds ordered this encounter  Medications   DEPAKOTE ER 500 MG 24 hr tablet    Sig: Take 2 tablets (1,000 mg total) by mouth daily.    Dispense:  180 tablet    Refill:  4    Brand only     Valproic Acid, Divalproex Sodium delayed or extended-release tablets What is this medication? DIVALPROEX SODIUM (dye VAL pro ex SO dee um) is used to prevent seizures caused by some forms of epilepsy. It is also used to treat bipolar mania and toprevent migraine headaches. This medicine may be used for other purposes; ask your health care provider orpharmacist if you have questions. COMMON BRAND NAME(S): Depakote, Depakote ER What should I tell my care team before I take this medication? They need to know if you have any of these conditions: if you often drink alcohol kidney disease liver disease low platelet counts mitochondrial disease suicidal thoughts, plans, or attempt; a previous suicide attempt by you or a family member urea cycle disorder (UCD) an unusual or allergic reaction to divalproex sodium, sodium valproate, valproic acid, other medicines, foods, dyes, or preservatives pregnant or trying to get pregnant breast-feeding How should I use this medication? Take this medicine by mouth with a drink of water. Follow the directions on the prescription label. Do not cut, crush or chew this medicine. You can take it with or without food. If it upsets your stomach, take it with food. Take your medicine at regular intervals. Do not take it more often than directed. Do notstop taking except on your doctor's advice. A special MedGuide will be given to you by the pharmacist with eachprescription and refill. Be sure to read this information carefully each time. Talk to your pediatrician regarding the use of this medicine in children. While this drug may be prescribed for children as young as 10 years for selectedconditions, precautions do apply. Overdosage: If you think you have taken too much of this  medicine contact apoison control center or emergency room at once. NOTE: This medicine is only for you. Do not share this medicine with others. What if I miss a dose? If you miss a dose, take it as soon as you can. If it is almost time for yournext dose, take only that dose. Do not take double or extra doses. What may interact with this medication? Do not take this medicine with any of the following medications: sodium phenylbutyrate This medicine may also interact with the following medications: aspirin certain antibiotics like ertapenem, imipenem, meropenem certain medicines for depression, anxiety, or psychotic disturbances certain medicines for seizures like carbamazepine, clonazepam, diazepam, ethosuximide, felbamate, lamotrigine, phenobarbital, phenytoin, primidone, rufinamide, topiramate certain medicines that treat or prevent blood clots like warfarin cholestyramine female hormones, like estrogens and birth control pills, patches, or rings propofol rifampin ritonavir tolbutamide zidovudine This list may not describe all possible interactions. Give your health care provider a list of all the medicines, herbs, non-prescription drugs, or dietary supplements you use. Also tell them if you smoke, drink alcohol, or use illegaldrugs. Some items may interact with your medicine. What should I watch for while using this medication? Tell your doctor or health care provider if your symptoms do not get better orthey start to get worse. This medicine may cause serious skin reactions. They can happen weeks to months after starting the medicine. Contact your health care provider right away if you notice fevers or flu-like symptoms with a rash. The rash may be red or purple and then turn into  blisters or peeling of the skin. Or, you might notice a red rash with swelling of the face, lips or lymph nodes in your neck or underyour arms. Wear a medical ID bracelet or chain, and carry a card that describes  yourdisease and details of your medicine and dosage times. You may get drowsy, dizzy, or have blurred vision. Do not drive, use machinery, or do anything that needs mental alertness until you know how this medicine affects you. To reduce dizzy or fainting spells, do not sit or stand up quickly, especially if you are an older patient. Alcohol can increasedrowsiness and dizziness. Avoid alcoholic drinks. This medicine can make you more sensitive to the sun. Keep out of the sun. If you cannot avoid being in the sun, wear protective clothing and use sunscreen.Do not use sun lamps or tanning beds/booths. Patients and their families should watch out for new or worsening depression or thoughts of suicide. Also watch out for sudden changes in feelings such as feeling anxious, agitated, panicky, irritable, hostile, aggressive, impulsive, severely restless, overly excited and hyperactive, or not being able to sleep. If this happens, especially at the beginning of treatment or after a change indose, call your health care provider. Women should inform their doctor if they wish to become pregnant or think they might be pregnant. There is a potential for serious side effects to an unborn child. Talk to your health care provider or pharmacist for more information. Women who become pregnant while using this medicine may enroll in the [place] Pregnancy Registry by calling [phone]. This registry collects information about the safety of antiepileptic drug use duringpregnancy. This medicine may cause a decrease in folic acid and vitamin D. You should make sure that you get enough vitamins while you are taking this medicine. Discussthe foods you eat and the vitamins you take with your health care provider. What side effects may I notice from receiving this medication? Side effects that you should report to your doctor or health care professionalas soon as possible: allergic reactions like skin  rash, itching or hives, swelling of the face, lips, or tongue changes in vision rash, fever, and swollen lymph nodes redness, blistering, peeling or loosening of the skin, including inside the mouth signs and symptoms of liver injury like dark yellow or brown urine; general ill feeling or flu-like symptoms; light-colored stools; loss of appetite; nausea; right upper belly pain; unusually weak or tired; yellowing of the eyes or skin suicidal thoughts or other mood changes unusual bleeding or bruising Side effects that usually do not require medical attention (report to yourdoctor or health care professional if they continue or are bothersome): constipation diarrhea dizziness hair loss headache loss of appetite weight gain This list may not describe all possible side effects. Call your doctor for medical advice about side effects. You may report side effects to FDA at1-800-FDA-1088. Where should I keep my medication? Keep out of reach of children. Store at room temperature between 15 and 30 degrees C (59 and 86 degrees F). Keep container tightly closed. Throw away any unused medicine after theexpiration date. NOTE: This sheet is a summary. It may not cover all possible information. If you have questions about this medicine, talk to your doctor, pharmacist, orhealth care provider.  2022 [employer]/Gold Standard (2018-11-10 16:03:42)   Per [facility] statutes, patients with seizures are not allowed to drive until they have been seizure-free for six months.    Use caution when using heavy equipment or power  tools. Avoid working on ladders or at heights. Take showers instead of baths. Ensure the water temperature is not too high on the home water heater. Do not go swimming alone. Do not lock yourself in a room alone (i.e. bathroom). When caring for infants or small children, sit down when holding, feeding, or changing them to minimize risk of injury to the child in the event you have a  seizure. Maintain good sleep hygiene. Avoid alcohol.    If patient has another seizure, call 911 and bring them back to the ED if: A.  The seizure lasts longer than 5 minutes.      B.  The patient doesn't wake shortly after the seizure or has new problems such as difficulty seeing, speaking or moving following the seizure C.  The patient was injured during the seizure D.  The patient has a temperature over 102 F (39C) E.  The patient vomited during the seizure and now is having trouble breathing  Per [facility] statutes, patients with seizures are not allowed to drive until they have been seizure-free for six months.  Other recommendations include using caution when using heavy equipment or power tools. Avoid working on ladders or at heights. Take showers instead of baths.  Do not swim alone.  Ensure the water temperature is not too high on the home water heater. Do not go swimming alone. Do not lock yourself in a room alone (i.e. bathroom). When caring for infants or small children, sit down when holding, feeding, or changing them to minimize risk of injury to the child in the event you have a seizure. Maintain good sleep hygiene. Avoid alcohol.  Also recommend adequate sleep, hydration, good diet and minimize stress.  During the Seizure  - First, ensure adequate ventilation and place patients on the floor on their left side  Loosen clothing around the neck and ensure the airway is patent. If the patient is clenching the teeth, do not force the mouth open with any object as this can cause severe damage - Remove all items from the surrounding that can be hazardous. The patient may be oblivious to what's happening and may not even know what he or she is doing. If the patient is confused and wandering, either gently guide him/her away and block access to outside areas - Reassure the individual and be comforting - Call 911. In most cases, the seizure ends before EMS arrives. However, there are  cases when seizures may last over 3 to 5 minutes. Or the individual may have developed breathing difficulties or severe injuries. If a pregnant patient or a person with diabetes develops a seizure, it is prudent to call an ambulance. - Finally, if the patient does not regain full consciousness, then call EMS. Most patients will remain confused for about 45 to 90 minutes after a seizure, so you must use judgment in calling for help. - Avoid restraints but make sure the patient is in a bed with padded side rails - Place the individual in a lateral position with the neck slightly flexed; this will help the saliva drain from the mouth and prevent the tongue from falling backward - Remove all nearby furniture and other hazards from the area - Provide verbal assurance as the individual is regaining consciousness - Provide the patient with privacy if possible - Call for help and start treatment as ordered by the caregiver   fter the Seizure (Postictal Stage)  After a seizure, most patients experience confusion, fatigue, muscle  pain and/or a headache. Thus, one should permit the individual to sleep. For the next few days, reassurance is essential. Being calm and helping reorient the person is also of importance.  Most seizures are painless and end spontaneously. Seizures are not harmful to others but can lead to complications such as stress on the lungs, brain and the heart. Individuals with prior lung problems may develop labored breathing and respiratory distress.
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
## 2021-03-06 LAB — VALPROIC ACID LEVEL: Valproic Acid Lvl: 63 ug/mL (ref 50–100)

## 2021-03-08 DIAGNOSIS — Z79899 Other long term (current) drug therapy: Secondary | ICD-10-CM | POA: Insufficient documentation

## 2021-03-08 DIAGNOSIS — G40001 Localization-related (focal) (partial) idiopathic epilepsy and epileptic syndromes with seizures of localized onset, not intractable, with status epilepticus: Secondary | ICD-10-CM | POA: Insufficient documentation

## 2021-03-09 ENCOUNTER — Inpatient Hospital Stay (HOSPITAL_BASED_OUTPATIENT_CLINIC_OR_DEPARTMENT_OTHER): Payer: BC Managed Care – PPO | Admitting: Hematology & Oncology

## 2021-03-09 ENCOUNTER — Other Ambulatory Visit: Payer: BC Managed Care – PPO

## 2021-03-09 ENCOUNTER — Other Ambulatory Visit: Payer: Self-pay

## 2021-03-09 VITALS — BP 108/68 | HR 79 | Temp 98.4°F | Wt 197.2 lb

## 2021-03-09 DIAGNOSIS — D563 Thalassemia minor: Secondary | ICD-10-CM
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
## 2021-03-11 ENCOUNTER — Encounter: Payer: Self-pay | Admitting: *Deleted

## 2021-03-12 ENCOUNTER — Telehealth: Payer: Self-pay | Admitting: *Deleted

## 2021-03-12 NOTE — Telephone Encounter (Signed)
Colleague Megan spoke with pt. Pt requesting to be out of work from 02/22/21 (date of hospitalization) and return to work on 03/26/21. Dr Jaynee Eagles reviewed and disability form completed, signed, and sent to medical records for processing.

## 2021-03-12 NOTE — Telephone Encounter (Signed)
Just received pt FMLA form. Pt called need form faxed in by 03/17/21.

## 2021-03-16 NOTE — Telephone Encounter (Signed)
Pt Abigail Hall claim form faxed on 03/16/21 to [phone]

## 2021-03-26 ENCOUNTER — Other Ambulatory Visit: Payer: Self-pay | Admitting: Radiology

## 2021-03-31 ENCOUNTER — Telehealth: Payer: Self-pay | Admitting: Neurology

## 2021-03-31 NOTE — Telephone Encounter (Signed)
Pt's mother called stating daughter is currently incarcerated, and they are needing a latter from the doctor stating pt needs the DEPAKOTE ER 500 MG 24 hr tablet. Mother says they only want to give her the generic brand,but a letter will help with giving her the actual medicine. Pt's mother is requesting a call back.

## 2021-04-02 NOTE — Telephone Encounter (Signed)
I called the pt's mother Karl Pock (on [place]) and LVM asking for call back.  Need more information: who is the letter going to? Where does the medication come from?

## 2021-04-03 NOTE — Telephone Encounter (Signed)
Pt's mother called back. Please call back.

## 2021-04-06 NOTE — Telephone Encounter (Signed)
I spoke with the patient's mother. She provided her home phone number of [phone]. She stated the patient is currently incarcerated but the mother is working on trying to get bail for her within the next few days. She said the patient should be able to call once she is released to further discuss what questions or needs she has.  She did state that the generic versus brand issue got resolved and the patient is now on the correct seizure medication, brand only, as of last Friday. Patient's mother was very appreciative for the call.

## 2021-05-21 ENCOUNTER — Telehealth: Payer: Self-pay | Admitting: Neurology

## 2021-05-21 NOTE — Telephone Encounter (Signed)
Pt was called back and assured that her 10-31 appointment date and time is still reserved for her and that no changes need to be made.  Pt understood and had no questions.  This is FYI for POD 4

## 2021-05-21 NOTE — Telephone Encounter (Signed)
Great, thank you!

## 2021-05-21 NOTE — Telephone Encounter (Signed)
Pt asked about next appointment with Dr Jaynee Eagles, phone rep advised pt the 10-31 appointment needs to be r/s.  Pt declined the next avail(which is in Jan, pt declined the wait list) .  Pt has asked that a message be sent to Dr Jaynee Eagles asking that she calls her with her next available.  Phone rep made multiple attempts in trying to explain scheduling and the benefit of the wait list, pt declined and is asking to be called with Dr Cathren Laine next available.

## 2021-05-21 NOTE — Telephone Encounter (Signed)
06/15/21 appt does not need to r/s. Have d/w phone staff who will call pt back and advise.

## 2021-06-02 ENCOUNTER — Telehealth: Payer: Self-pay | Admitting: *Deleted

## 2021-06-03 NOTE — Telephone Encounter (Signed)
Received call from pt and also [employer] Beverlee Nims, relating to needing PA for her DEPAKOTE ER (BRAND) due to change in her insurance but also change from her  [facility] to [employer] [facility]  Initiated [place] on [facility] 06/02/21 approval received CASE # [id number]. MEMBER ID [id number]. Valid 06/02/21-06/02/2022.

## 2021-06-03 NOTE — Telephone Encounter (Signed)
Called pt and she cannot afford copay for brand DEPAKOTE.   She is looking into savings card but also [employer] PAP.

## 2021-06-03 NOTE — Telephone Encounter (Signed)
Completed [employer] application for pt, signed. Emailed to [email or web address] per pt request.  Done, received confirmation email sent.

## 2021-06-08 ENCOUNTER — Encounter: Payer: Self-pay | Admitting: Family

## 2021-06-10 ENCOUNTER — Ambulatory Visit: Payer: BC Managed Care – PPO | Admitting: Neurology

## 2021-06-11 ENCOUNTER — Encounter: Payer: Self-pay | Admitting: Family

## 2021-06-14 ENCOUNTER — Encounter: Payer: Self-pay | Admitting: Family

## 2021-06-15 ENCOUNTER — Encounter: Payer: Self-pay | Admitting: Neurology

## 2021-06-15 ENCOUNTER — Ambulatory Visit (INDEPENDENT_AMBULATORY_CARE_PROVIDER_SITE_OTHER): Payer: 59 | Admitting: Neurology

## 2021-06-15 ENCOUNTER — Encounter: Payer: Self-pay | Admitting: Family

## 2021-06-15 VITALS — BP 106/67 | HR 76 | Ht 64.0 in | Wt 195.2 lb

## 2021-06-15 DIAGNOSIS — G40001 Localization-related (focal) (partial) idiopathic epilepsy and epileptic syndromes with seizures of localized onset, not intractable, with status epilepticus: Secondary | ICD-10-CM | POA: Diagnosis not present

## 2021-06-15 MED ORDER — DEPAKOTE ER 500 MG PO TB24: 1000.0000 mg | ORAL_TABLET | Freq: Every day | ORAL | 4 refills | Status: DC
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
## 2021-06-16 LAB — CBC WITH DIFFERENTIAL/PLATELET
Basophils Absolute: 0.1 10*3/uL (ref 0.0–0.2)
Basos: 1 %
EOS (ABSOLUTE): 0.1 10*3/uL (ref 0.0–0.4)
Eos: 2 %
Hematocrit: 34.8 % (ref 34.0–46.6)
Hemoglobin: 10.2 g/dL — ABNORMAL LOW (ref 11.1–15.9)
Immature Grans (Abs): 0 10*3/uL (ref 0.0–0.1)
Immature Granulocytes: 0 %
Lymphocytes Absolute: 3.6 10*3/uL — ABNORMAL HIGH (ref 0.7–3.1)
Lymphs: 55 %
MCH: 17.8 pg — ABNORMAL LOW (ref 26.6–33.0)
MCHC: 29.3 g/dL — ABNORMAL LOW (ref 31.5–35.7)
MCV: 61 fL — ABNORMAL LOW (ref 79–97)
Monocytes Absolute: 0.4 10*3/uL (ref 0.1–0.9)
Monocytes: 6 %
Neutrophils Absolute: 2.3 10*3/uL (ref 1.4–7.0)
Neutrophils: 36 %
Platelets: 289 10*3/uL (ref 150–450)
RBC: 5.72 x10E6/uL — ABNORMAL HIGH (ref 3.77–5.28)
RDW: 17.6 % — ABNORMAL HIGH (ref 11.7–15.4)
WBC: 6.4 10*3/uL (ref 3.4–10.8)

## 2021-06-16 LAB — COMPREHENSIVE METABOLIC PANEL
ALT: 9 IU/L (ref 0–32)
AST: 13 IU/L (ref 0–40)
Albumin/Globulin Ratio: 1.9 (ref 1.2–2.2)
Albumin: 4.5 g/dL (ref 3.8–4.8)
Alkaline Phosphatase: 63 IU/L (ref 44–121)
BUN/Creatinine Ratio: 16 (ref 9–23)
BUN: 11 mg/dL (ref 6–24)
Bilirubin Total: 0.4 mg/dL (ref 0.0–1.2)
CO2: 21 mmol/L (ref 20–29)
Calcium: 9.4 mg/dL (ref 8.7–10.2)
Chloride: 102 mmol/L (ref 96–106)
Creatinine, Ser: 0.69 mg/dL (ref 0.57–1.00)
Globulin, Total: 2.4 g/dL (ref 1.5–4.5)
Glucose: 82 mg/dL (ref 70–99)
Potassium: 5 mmol/L (ref 3.5–5.2)
Sodium: 137 mmol/L (ref 134–144)
Total Protein: 6.9 g/dL (ref 6.0–8.5)
eGFR: 112 mL/min/{1.73_m2} (ref >59)

## 2021-06-16 LAB — VALPROIC ACID LEVEL: Valproic Acid Lvl: 88 ug/mL (ref 50–100)

## 2021-06-18 NOTE — Telephone Encounter (Signed)
Pt called stating that we have completed an old [place] form. When she turned it in they told her information was missing. Pt requesting a call back.

## 2021-06-22 NOTE — Telephone Encounter (Signed)
I called pt.  She received letter that needed some information,  wrong application.  3 way call made with pt, janet with [facility] [employer] and myself.  They needed quantity #180 for 90 day.  Date on application came crooked, needs to be redone. Also need income infor from pt and also front back insurance cards  Done.   I refaxed to [phone], ID# [id number].  Fax confirmation received.

## 2021-07-13 ENCOUNTER — Other Ambulatory Visit: Payer: Self-pay | Admitting: Neurology

## 2021-12-07 ENCOUNTER — Encounter: Payer: Self-pay | Admitting: Family

## 2021-12-14 ENCOUNTER — Encounter: Payer: Self-pay | Admitting: Family

## 2021-12-14 ENCOUNTER — Telehealth (INDEPENDENT_AMBULATORY_CARE_PROVIDER_SITE_OTHER): Payer: 59 | Admitting: Neurology

## 2021-12-14 DIAGNOSIS — Z79899 Other long term (current) drug therapy: Secondary | ICD-10-CM

## 2021-12-14 MED ORDER — FLUOCINOLONE ACETONIDE SCALP 0.01 % EX OIL: 1.0000 "application " | TOPICAL_OIL | Freq: Every day | CUTANEOUS | 11 refills | Status: AC | PRN

## 2021-12-14 MED ORDER — KETOCONAZOLE 2 % EX SHAM: MEDICATED_SHAMPOO | CUTANEOUS | 11 refills | Status: DC
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
## 2022-01-26 ENCOUNTER — Other Ambulatory Visit: Payer: Self-pay | Admitting: *Deleted

## 2022-01-26 MED ORDER — DIVALPROEX SODIUM ER 250 MG PO TB24: 1000.0000 mg | ORAL_TABLET | Freq: Every day | ORAL | 6 refills | Status: DC

## 2022-01-26 NOTE — Telephone Encounter (Signed)
I called pt and relayed that fax was received, completed and signed and then faxed , confirmation received back to [facility] Assist for the Depakote ER '250mg'$  tablets.  She will call to f/u on. Appreciated call.

## 2022-01-26 NOTE — Telephone Encounter (Signed)
Received fax, this pt has been on Depakote ER '500mg'$  tablets ('1000mg'$  po daily)  by [facility].  Since there is shortage of '500mg'$  ER tabs, changed to '250mg'$  ER TABS (take 4 tablets once daily) until '500mg'$  ER tabs available. Fax confirmation received. [phone], [phone]

## 2022-01-27 NOTE — Telephone Encounter (Signed)
Received call needing more information on the prescription for the depakote ER '250mg'$  tabs  (90 day supply quantity 360# with one refill).   Fax confirmation received [phone].

## 2022-02-01 ENCOUNTER — Telehealth: Payer: Self-pay | Admitting: *Deleted

## 2022-02-01 NOTE — Telephone Encounter (Signed)
Pt approved for application depakote ER '250mg'$  tablets assistance thry Jul 09, 2022.  [phone].  She is to contact [facility] for refills [phone] 2 wks prior to needing medication.

## 2022-03-31 ENCOUNTER — Encounter: Payer: Self-pay | Admitting: Family

## 2022-07-29 ENCOUNTER — Encounter: Payer: Self-pay | Admitting: *Deleted

## 2022-07-29 NOTE — Telephone Encounter (Signed)
Received fax from [facility] assist requesting Depakote Rx page completion for 2024 assistance. Pt ID # [id number]. Prescription for Depakote ER 250 mg (take 4 by mouth daily) completed then form signed by Dr Jaynee Eagles. Form faxed back to [facility] assist. Not sure if we have to do a PA this time. I did reach out to the pt asking for her pharmacy benefit info just in case.

## 2022-09-09 NOTE — Telephone Encounter (Signed)
Received a fax from [facility] stating the patient has been approved to receive Depakote ER 250 mg 100 ct tablets through September 03, 2023. Patient will receive a supply in the mail. She will need to contact to request future refills #[phone].

## 2022-11-24 ENCOUNTER — Encounter: Payer: Self-pay | Admitting: Family

## 2022-11-26 ENCOUNTER — Encounter: Payer: Self-pay | Admitting: Family

## 2022-12-01 ENCOUNTER — Encounter: Payer: Self-pay | Admitting: Family

## 2022-12-09 ENCOUNTER — Encounter: Payer: Self-pay | Admitting: Family

## 2022-12-21 ENCOUNTER — Encounter: Payer: Self-pay | Admitting: Family

## 2022-12-22 ENCOUNTER — Ambulatory Visit: Payer: Medicaid Other | Admitting: Family

## 2022-12-22 ENCOUNTER — Encounter: Payer: Self-pay | Admitting: Family

## 2022-12-22 VITALS — BP 124/72 | HR 77 | Ht 64.0 in | Wt 216.8 lb

## 2022-12-22 DIAGNOSIS — E1165 Type 2 diabetes mellitus with hyperglycemia: Secondary | ICD-10-CM

## 2022-12-22 DIAGNOSIS — R5383 Other fatigue: Secondary | ICD-10-CM

## 2022-12-22 DIAGNOSIS — I10 Essential (primary) hypertension: Secondary | ICD-10-CM | POA: Diagnosis not present

## 2022-12-22 DIAGNOSIS — R232 Flushing: Secondary | ICD-10-CM

## 2022-12-22 DIAGNOSIS — E538 Deficiency of other specified B group vitamins: Secondary | ICD-10-CM | POA: Diagnosis not present

## 2022-12-22 DIAGNOSIS — K648 Other hemorrhoids: Secondary | ICD-10-CM

## 2022-12-22 DIAGNOSIS — E782 Mixed hyperlipidemia: Secondary | ICD-10-CM

## 2022-12-22 DIAGNOSIS — G40309 Generalized idiopathic epilepsy and epileptic syndromes, not intractable, without status epilepticus: Secondary | ICD-10-CM

## 2022-12-22 DIAGNOSIS — E559 Vitamin D deficiency, unspecified: Secondary | ICD-10-CM | POA: Diagnosis not present

## 2022-12-22 MED ORDER — TOBRAMYCIN-DEXAMETHASONE 0.3-0.1 % OP SUSP: 1.0000 [drp] | OPHTHALMIC | 0 refills | Status: DC

## 2022-12-23 LAB — CBC WITH DIFFERENTIAL
Basophils Absolute: 0.1 10*3/uL (ref 0.0–0.2)
Basos: 1 %
EOS (ABSOLUTE): 0.1 10*3/uL (ref 0.0–0.4)
Eos: 2 %
Hematocrit: 33.6 % — ABNORMAL LOW (ref 34.0–46.6)
Hemoglobin: 9.8 g/dL — ABNORMAL LOW (ref 11.1–15.9)
Immature Grans (Abs): 0 10*3/uL (ref 0.0–0.1)
Immature Granulocytes: 0 %
Lymphocytes Absolute: 2.6 10*3/uL (ref 0.7–3.1)
Lymphs: 41 %
MCH: 18.2 pg — ABNORMAL LOW (ref 26.6–33.0)
MCHC: 29.2 g/dL — ABNORMAL LOW (ref 31.5–35.7)
MCV: 63 fL — ABNORMAL LOW (ref 79–97)
Monocytes Absolute: 0.4 10*3/uL (ref 0.1–0.9)
Monocytes: 6 %
Neutrophils Absolute: 3.2 10*3/uL (ref 1.4–7.0)
Neutrophils: 50 %
RBC: 5.38 x10E6/uL — ABNORMAL HIGH (ref 3.77–5.28)
RDW: 17.6 % — ABNORMAL HIGH (ref 11.7–15.4)
WBC: 6.4 10*3/uL (ref 3.4–10.8)

## 2022-12-23 LAB — VITAMIN D 25 HYDROXY (VIT D DEFICIENCY, FRACTURES): Vit D, 25-Hydroxy: 34.6 ng/mL (ref 30.0–100.0)

## 2022-12-23 LAB — LIPID PANEL
Chol/HDL Ratio: 3.4 ratio (ref 0.0–4.4)
Cholesterol, Total: 169 mg/dL (ref 100–199)
HDL: 49 mg/dL (ref >39)
LDL Chol Calc (NIH): 106 mg/dL — ABNORMAL HIGH (ref 0–99)
Triglycerides: 75 mg/dL (ref 0–149)
VLDL Cholesterol Cal: 14 mg/dL (ref 5–40)

## 2022-12-23 LAB — CMP14+EGFR
ALT: 12 IU/L (ref 0–32)
AST: 11 IU/L (ref 0–40)
Albumin/Globulin Ratio: 1.7 (ref 1.2–2.2)
Albumin: 4.2 g/dL (ref 3.9–4.9)
Alkaline Phosphatase: 70 IU/L (ref 44–121)
BUN/Creatinine Ratio: 16 (ref 9–23)
BUN: 11 mg/dL (ref 6–24)
Bilirubin Total: 0.6 mg/dL (ref 0.0–1.2)
CO2: 20 mmol/L (ref 20–29)
Calcium: 9.5 mg/dL (ref 8.7–10.2)
Chloride: 103 mmol/L (ref 96–106)
Creatinine, Ser: 0.68 mg/dL (ref 0.57–1.00)
Globulin, Total: 2.5 g/dL (ref 1.5–4.5)
Glucose: 94 mg/dL (ref 70–99)
Potassium: 4.9 mmol/L (ref 3.5–5.2)
Sodium: 137 mmol/L (ref 134–144)
Total Protein: 6.7 g/dL (ref 6.0–8.5)
eGFR: 111 mL/min/{1.73_m2} (ref >59)

## 2022-12-23 LAB — FSH+PROG+E2+SHBG
Estradiol: 48.6 pg/mL
FSH: 7.7 m[IU]/mL
Progesterone: 1.7 ng/mL
Sex Hormone Binding: 117 nmol/L (ref 24.6–122.0)

## 2022-12-23 LAB — VITAMIN B12: Vitamin B-12: >2000 pg/mL — ABNORMAL HIGH (ref 232–1245)

## 2022-12-23 LAB — TSH: TSH: 1.26 u[IU]/mL (ref 0.450–4.500)

## 2022-12-23 LAB — HEMOGLOBIN A1C
Est. average glucose Bld gHb Est-mCnc: 108 mg/dL
Hgb A1c MFr Bld: 5.4 % (ref 4.8–5.6)

## 2022-12-29 NOTE — Progress Notes (Signed)
New Patient Office Visit  Subjective    Patient ID: Abigail Hall, female    DOB: 1981/04/25  Age: 42 y.o. MRN: [id number]  CC:  Chief Complaint  Patient presents with   Establish Care    NPE    HPI Abigail Hall presents to establish care Previous Primary Care provider/office:   she does not have additional concerns to discuss today.   HPI  Outpatient Encounter Medications as of 12/22/2022  Medication Sig   acetaminophen (TYLENOL) 500 MG tablet Take 2 tablets (1,000 mg total) by mouth every 6 (six) hours as needed.   Biotin 40981 MCG TBDP Take 10,000 mcg by mouth daily.   DEPAKOTE ER 500 MG 24 hr tablet Take 2 tablets (1,000 mg total) by mouth daily.   Fluocinolone Acetonide Scalp 0.01 % OIL Apply 1 application. topically daily as needed. APPLY EXTERNALLY TO THE AFFECTED AREA DAILY AS NEEDED Strength: 0.01%   ketoconazole (NIZORAL) 2 % shampoo APPLY EXTERNALLY 2 TIMES A WEEK   polyethylene glycol powder (GLYCOLAX/MIRALAX) 17 GM/SCOOP powder Take 1 Container by mouth daily as needed for mild constipation. 1 scoop as needed   tobramycin-dexamethasone (TOBRADEX) ophthalmic solution Place 1 drop into the right eye every 4 (four) hours while awake.   [DISCONTINUED] ALPRAZolam (XANAX) 1 MG tablet Take 1 mg by mouth 3 (three) times daily as needed for anxiety.   [DISCONTINUED] ALPRAZolam (XANAX) 1 MG tablet Take 1 mg by mouth 3 (three) times daily as needed for anxiety. (Patient not taking: Reported on 12/22/2022)   [DISCONTINUED] divalproex (DEPAKOTE ER) 250 MG 24 hr tablet Take 4 tablets (1,000 mg total) by mouth daily. (Patient not taking: Reported on 12/22/2022)   [DISCONTINUED] ELDERBERRY PO Take 1 tablet by mouth daily. (Patient not taking: Reported on 12/22/2022)   [DISCONTINUED] Multiple Vitamin (MULTIVITAMIN WITH MINERALS) TABS tablet Take 1 tablet by mouth daily. (Patient not taking: Reported on 12/22/2022)   [DISCONTINUED] vitamin C (ASCORBIC ACID) 500 MG tablet Take 500 mg  by mouth daily. (Patient not taking: Reported on 12/22/2022)   [DISCONTINUED] vitamin E 1000 UNIT capsule Take 1,000 Units by mouth daily. (Patient not taking: Reported on 12/22/2022)   No facility-administered encounter medications on file as of 12/22/2022.    Past Medical History:  Diagnosis Date   Anemia    Anemia, iron deficiency 04/10/2012   Constipation    Depression 04/26/13   history - no current problems   Generalized headaches    Hemorrhoids    History of blood transfusion 02/2013   [facility]  2 units transfused    Nausea & vomiting    resolved after delivery - C/S, extreme   Rectal bleeding    Rectal pain    Seasonal allergies    Seizure (HCC)    Seizures (HCC)    last seizure was  08/2012   Thalassemia    Weakness    resolved after C/S delivery    Past Surgical History:  Procedure Laterality Date   ABDOMINAL HYSTERECTOMY     CARPAL TUNNEL RELEASE  01/2009   right   CESAREAN SECTION  12/31/2011   Procedure: CESAREAN SECTION;  Surgeon: Delbert Harness, MD;  Location: [facility];  Service: Gynecology;  Laterality: N/A;   DILATION AND CURETTAGE OF UTERUS     endometrial ablation   EVALUATION UNDER ANESTHESIA WITH HEMORRHOIDECTOMY N/A 05/31/2013   Procedure: EXAM UNDER ANESTHESIA WITH EXTERNAL HEMORRHOIDECTOMY;  Surgeon: Ardeth Sportsman, MD;  Location: [facility];  Service: General;  Laterality: N/A;  LABIOPLASTY  03/27/2012   Procedure: LABIAPLASTY;  Surgeon: Delbert Harness, MD;  Location: [facility];  Service: Gynecology;  Laterality: N/A;  labia   LAPAROSCOPIC APPENDECTOMY N/A 06/05/2020   Procedure: APPENDECTOMY LAPAROSCOPIC;  Surgeon: Kinsinger, De Blanch, MD;  Location: [facility];  Service: General;  Laterality: N/A;   LAPAROSCOPIC TUBAL LIGATION  03/27/2012   Procedure: LAPAROSCOPIC TUBAL LIGATION;  Surgeon: Delbert Harness, MD;  Location: [facility];  Service: Gynecology;  Laterality: Bilateral;  fallopian tubes caurtery   LAPAROSCOPY     removal of cyst   ROBOTIC ASSISTED TOTAL  HYSTERECTOMY N/A 05/02/2013   Procedure: ROBOTIC ASSISTED TOTAL HYSTERECTOMY;  Surgeon: [name], MD;  Location: [facility];  Service: Gynecology;  Laterality: N/A;   TONGUE SURGERY     TRANSANAL HEMORRHOIDAL DEARTERIALIZATION N/A 05/31/2013   Procedure: THD HEMORRHOIDAL LIGATION/PEXY;  Surgeon: Ardeth Sportsman, MD;  Location: [facility];  Service: General;  Laterality: N/A;   TUBAL LIGATION     UNILATERAL SALPINGECTOMY Right 05/02/2013   Procedure: UNILATERAL SALPINGECTOMY;  Surgeon: [name], MD;  Location: [facility];  Service: Gynecology;  Laterality: Right;   WISDOM TOOTH EXTRACTION      Family History  Problem Relation Age of Onset   Diabetes Mother    Hyperlipidemia Father    Cancer Maternal Grandfather    Hyperlipidemia Paternal Grandfather    Diabetes Maternal Grandmother    Anesthesia problems Neg Hx    Hypotension Neg Hx    Malignant hyperthermia Neg Hx    Pseudochol deficiency Neg Hx     Social History   Socioeconomic History   Marital status: Single    Spouse name: Not on file   Number of children: 1   Years of education: Some College   Highest education level: Not on file  Occupational History   Occupation: [employer]  Tobacco Use   Smoking status: Never   Smokeless tobacco: Never  Vaping Use   Vaping Use: Never used  Substance and Sexual Activity   Alcohol use: Yes    Comment: occ   Drug use: No   Sexual activity: Not on file  Other Topics Concern   Not on file  Social History Narrative   ** Merged History Encounter **       Lives at home with her son Caffeine use: none Right-handed   Social Determinants of Corporate investment banker Strain: Not on file  Food Insecurity: Not on file  Transportation Needs: Not on file  Physical Activity: Not on file  Stress: Not on file  Social Connections: Not on file  Intimate Partner Violence: Not on file    Review of Systems  All other systems reviewed and are negative.       Objective     BP 124/72   Pulse 77   Ht 5\' 4"  (1.626 m)   Wt 216 lb 12.8 oz (98.3 kg)   LMP 04/25/2013   SpO2 98%   BMI 37.21 kg/m   Physical Exam Vitals and nursing note reviewed.  Constitutional:      Appearance: Normal appearance. She is normal weight.  HENT:     Head: Normocephalic.  Eyes:     Pupils: Pupils are equal, round, and reactive to light.  Cardiovascular:     Rate and Rhythm: Normal rate.  Pulmonary:     Effort: Pulmonary effort is normal.  Neurological:     Mental Status: She is alert.        Assessment &  Plan:   Problem List Items Addressed This Visit       Active Problems   Hemorrhoids, internal, with bleeding s/p [facility] 05/31/2013   Other Visit Diagnoses     B12 deficiency due to diet    -  Primary   Relevant Orders   CBC With Differential (Completed)   CMP14+EGFR (Completed)   Vitamin B12 (Completed)   Essential hypertension, benign       Relevant Orders   CBC With Differential (Completed)   CMP14+EGFR (Completed)   Other fatigue       Relevant Orders   CBC With Differential (Completed)   CMP14+EGFR (Completed)   TSH (Completed)   FSH+Prog+E2+SHBG (Completed)   Vitamin D deficiency, unspecified       Relevant Orders   VITAMIN D 25 Hydroxy (Vit-D Deficiency, Fractures) (Completed)   CBC With Differential (Completed)   CMP14+EGFR (Completed)   Mixed hyperlipidemia       Relevant Orders   Lipid panel (Completed)   CBC With Differential (Completed)   CMP14+EGFR (Completed)   Type 2 diabetes mellitus with hyperglycemia, without long-term current use of insulin (HCC)       Relevant Orders   CBC With Differential (Completed)   CMP14+EGFR (Completed)   Hemoglobin A1c (Completed)   Hot flashes       Relevant Orders   FSH+Prog+E2+SHBG (Completed)   Nonintractable generalized idiopathic epilepsy without status epilepticus (HCC)           Return in about 2 weeks (around 01/05/2023) for F/U.   Total time spent: 45 minutes  Miki Kins,  FNP  12/22/2022  This document may have been prepared by [facility] Voice Recognition software and as such may include unintentional dictation errors.

## 2023-01-04 ENCOUNTER — Encounter: Payer: Self-pay | Admitting: Family

## 2023-01-04 ENCOUNTER — Ambulatory Visit: Payer: Medicaid Other | Admitting: Family

## 2023-01-04 VITALS — BP 122/70 | HR 86 | Ht 64.0 in | Wt 217.4 lb

## 2023-01-04 DIAGNOSIS — G40001 Localization-related (focal) (partial) idiopathic epilepsy and epileptic syndromes with seizures of localized onset, not intractable, with status epilepticus: Secondary | ICD-10-CM

## 2023-01-04 DIAGNOSIS — F900 Attention-deficit hyperactivity disorder, predominantly inattentive type: Secondary | ICD-10-CM

## 2023-01-04 DIAGNOSIS — D561 Beta thalassemia: Secondary | ICD-10-CM | POA: Diagnosis not present

## 2023-01-04 DIAGNOSIS — E1165 Type 2 diabetes mellitus with hyperglycemia: Secondary | ICD-10-CM

## 2023-01-04 DIAGNOSIS — Z0189 Encounter for other specified special examinations: Secondary | ICD-10-CM

## 2023-01-04 MED ORDER — SEMAGLUTIDE(0.25 OR 0.5MG/DOS) 2 MG/3ML ~~LOC~~ SOPN: PEN_INJECTOR | SUBCUTANEOUS | 0 refills | Status: DC

## 2023-01-04 MED ORDER — LISDEXAMFETAMINE DIMESYLATE 30 MG PO CAPS: 30.0000 mg | ORAL_CAPSULE | Freq: Every day | ORAL | 0 refills | Status: DC

## 2023-01-05 ENCOUNTER — Encounter: Payer: Self-pay | Admitting: Oncology

## 2023-01-05 ENCOUNTER — Encounter: Payer: Self-pay | Admitting: Family

## 2023-01-05 ENCOUNTER — Inpatient Hospital Stay: Payer: Medicaid Other | Attending: Oncology | Admitting: Oncology

## 2023-01-05 ENCOUNTER — Inpatient Hospital Stay: Payer: Medicaid Other

## 2023-01-05 ENCOUNTER — Telehealth: Payer: Self-pay | Admitting: Family

## 2023-01-05 VITALS — BP 116/65 | HR 72 | Temp 96.0°F | Resp 18 | Wt 218.6 lb

## 2023-01-05 DIAGNOSIS — Z90710 Acquired absence of both cervix and uterus: Secondary | ICD-10-CM | POA: Insufficient documentation

## 2023-01-05 DIAGNOSIS — R7989 Other specified abnormal findings of blood chemistry: Secondary | ICD-10-CM | POA: Diagnosis not present

## 2023-01-05 DIAGNOSIS — D509 Iron deficiency anemia, unspecified: Secondary | ICD-10-CM | POA: Insufficient documentation

## 2023-01-05 DIAGNOSIS — D649 Anemia, unspecified: Secondary | ICD-10-CM

## 2023-01-05 DIAGNOSIS — D561 Beta thalassemia: Secondary | ICD-10-CM | POA: Diagnosis present

## 2023-01-05 LAB — CBC WITH DIFFERENTIAL/PLATELET
Abs Immature Granulocytes: 0.03 10*3/uL (ref 0.00–0.07)
Basophils Absolute: 0 10*3/uL (ref 0.0–0.1)
Basophils Relative: 1 %
Eosinophils Absolute: 0.2 10*3/uL (ref 0.0–0.5)
Eosinophils Relative: 3 %
HCT: 31.2 % — ABNORMAL LOW (ref 36.0–46.0)
Hemoglobin: 9.4 g/dL — ABNORMAL LOW (ref 12.0–15.0)
Immature Granulocytes: 1 %
Lymphocytes Relative: 40 %
Lymphs Abs: 2.1 10*3/uL (ref 0.7–4.0)
MCH: 18.2 pg — ABNORMAL LOW (ref 26.0–34.0)
MCHC: 30.1 g/dL (ref 30.0–36.0)
MCV: 60.5 fL — ABNORMAL LOW (ref 80.0–100.0)
Monocytes Absolute: 0.3 10*3/uL (ref 0.1–1.0)
Monocytes Relative: 6 %
Neutro Abs: 2.6 10*3/uL (ref 1.7–7.7)
Neutrophils Relative %: 49 %
Platelets: 264 10*3/uL (ref 150–400)
RBC: 5.16 MIL/uL — ABNORMAL HIGH (ref 3.87–5.11)
RDW: 16.6 % — ABNORMAL HIGH (ref 11.5–15.5)
WBC: 5.2 10*3/uL (ref 4.0–10.5)
nRBC: 0.6 % — ABNORMAL HIGH (ref 0.0–0.2)

## 2023-01-05 LAB — CBC WITH DIFF/PLATELET
Basophils Absolute: 0.1 10*3/uL (ref 0.0–0.2)
Basos: 1 %
EOS (ABSOLUTE): 0.2 10*3/uL (ref 0.0–0.4)
Eos: 3 %
Hematocrit: 32.4 % — ABNORMAL LOW (ref 34.0–46.6)
Hemoglobin: 9.4 g/dL — ABNORMAL LOW (ref 11.1–15.9)
Immature Grans (Abs): 0 10*3/uL (ref 0.0–0.1)
Immature Granulocytes: 0 %
Lymphocytes Absolute: 2.3 10*3/uL (ref 0.7–3.1)
Lymphs: 38 %
MCH: 18.5 pg — ABNORMAL LOW (ref 26.6–33.0)
MCHC: 29 g/dL — ABNORMAL LOW (ref 31.5–35.7)
MCV: 64 fL — ABNORMAL LOW (ref 79–97)
Monocytes Absolute: 0.4 10*3/uL (ref 0.1–0.9)
Monocytes: 6 %
NRBC: 1 % — ABNORMAL HIGH (ref 0–0)
Neutrophils Absolute: 3.2 10*3/uL (ref 1.4–7.0)
Neutrophils: 52 %
Platelets: 289 10*3/uL (ref 150–450)
RBC: 5.07 x10E6/uL (ref 3.77–5.28)
RDW: 17.6 % — ABNORMAL HIGH (ref 11.7–15.4)
WBC: 6.2 10*3/uL (ref 3.4–10.8)

## 2023-01-05 LAB — RETIC PANEL
Immature Retic Fract: 15.3 % (ref 2.3–15.9)
RBC.: 5.13 MIL/uL — ABNORMAL HIGH (ref 3.87–5.11)
Retic Count, Absolute: 130.3 10*3/uL (ref 19.0–186.0)
Retic Ct Pct: 2.5 % (ref 0.4–3.1)
Reticulocyte Hemoglobin: 17.8 pg — ABNORMAL LOW (ref >27.9)

## 2023-01-05 LAB — IRON,TIBC AND FERRITIN PANEL
Ferritin: 1736 ng/mL — ABNORMAL HIGH (ref 15–150)
Iron Saturation: 36 % (ref 15–55)
Iron: 85 ug/dL (ref 27–159)
Total Iron Binding Capacity: 237 ug/dL — ABNORMAL LOW (ref 250–450)
UIBC: 152 ug/dL (ref 131–425)

## 2023-01-05 LAB — C-REACTIVE PROTEIN: CRP: 0.5 mg/dL (ref <1.0)

## 2023-01-05 LAB — TECHNOLOGIST SMEAR REVIEW: Plt Morphology: ADEQUATE

## 2023-01-05 LAB — SEDIMENTATION RATE: Sed Rate: 6 mm/hr (ref 0–20)

## 2023-01-05 LAB — FOLATE: Folate: 12.3 ng/mL (ref >5.9)

## 2023-01-05 LAB — IRON AND TIBC
Iron: 107 ug/dL (ref 28–170)
Saturation Ratios: 38 % — ABNORMAL HIGH (ref 10.4–31.8)
TIBC: 281 ug/dL (ref 250–450)
UIBC: 174 ug/dL

## 2023-01-05 LAB — LACTATE DEHYDROGENASE: LDH: 96 U/L — ABNORMAL LOW (ref 98–192)

## 2023-01-05 LAB — FERRITIN: Ferritin: 1398 ng/mL — ABNORMAL HIGH (ref 11–307)

## 2023-01-05 NOTE — Assessment & Plan Note (Signed)
Etiology unknown.  Could be secondary to previous iron infusions, versus chronic inflammation versus hereditary hemochromatosis. Will check hemochromatosis evaluation, inflammatory markers-ANA/CRP/ESR.

## 2023-01-05 NOTE — Progress Notes (Signed)
Established Patient Office Visit  Subjective:  Patient ID: Abigail Hall, female    DOB: Dec 07, 1980  Age: 42 y.o. MRN: [id number]  Chief Complaint  Patient presents with   Follow-up    2 week follow up with lab results    Pt. Here today for 2 week n/p follow up.   Had labs done at that time, so we will review in detail today.  Labs: Mostly well controlled. Vitamin D is low, Anemic.  Patient has known beta thalassemia.  She would like to start the meds for ADHD as we discussed previously.  I will send these to the pharmacy for her.   She also says that the bump on her eyelid has improved.  It does appear smaller today.   Finally, she would like to start Ozempic to cover her diabetes.   No other concerns at this time.   Past Medical History:  Diagnosis Date   Anemia    Anemia, iron deficiency 04/10/2012   Constipation    Depression 04/26/13   history - no current problems   Generalized headaches    Hemorrhoids    History of blood transfusion 02/2013   [facility]  2 units transfused    Nausea & vomiting    resolved after delivery - C/S, extreme   Rectal bleeding    Rectal pain    Seasonal allergies    Seizure (HCC)    Seizures (HCC)    last seizure was  08/2012   Thalassemia    Weakness    resolved after C/S delivery    Past Surgical History:  Procedure Laterality Date   ABDOMINAL HYSTERECTOMY     CARPAL TUNNEL RELEASE  01/2009   right   CESAREAN SECTION  12/31/2011   Procedure: CESAREAN SECTION;  Surgeon: Delbert Harness, MD;  Location: [facility];  Service: Gynecology;  Laterality: N/A;   DILATION AND CURETTAGE OF UTERUS     endometrial ablation   EVALUATION UNDER ANESTHESIA WITH HEMORRHOIDECTOMY N/A 05/31/2013   Procedure: EXAM UNDER ANESTHESIA WITH EXTERNAL HEMORRHOIDECTOMY;  Surgeon: Ardeth Sportsman, MD;  Location: [facility];  Service: General;  Laterality: N/A;   LABIOPLASTY  03/27/2012   Procedure: LABIAPLASTY;  Surgeon: Delbert Harness, MD;  Location: [facility];   Service: Gynecology;  Laterality: N/A;  labia   LAPAROSCOPIC APPENDECTOMY N/A 06/05/2020   Procedure: APPENDECTOMY LAPAROSCOPIC;  Surgeon: Kinsinger, De Blanch, MD;  Location: [facility];  Service: General;  Laterality: N/A;   LAPAROSCOPIC TUBAL LIGATION  03/27/2012   Procedure: LAPAROSCOPIC TUBAL LIGATION;  Surgeon: Delbert Harness, MD;  Location: [facility];  Service: Gynecology;  Laterality: Bilateral;  fallopian tubes caurtery   LAPAROSCOPY     removal of cyst   ROBOTIC ASSISTED TOTAL HYSTERECTOMY N/A 05/02/2013   Procedure: ROBOTIC ASSISTED TOTAL HYSTERECTOMY;  Surgeon: [name], MD;  Location: [facility];  Service: Gynecology;  Laterality: N/A;   TONGUE SURGERY     TRANSANAL HEMORRHOIDAL DEARTERIALIZATION N/A 05/31/2013   Procedure: THD HEMORRHOIDAL LIGATION/PEXY;  Surgeon: Ardeth Sportsman, MD;  Location: [facility];  Service: General;  Laterality: N/A;   TUBAL LIGATION     UNILATERAL SALPINGECTOMY Right 05/02/2013   Procedure: UNILATERAL SALPINGECTOMY;  Surgeon: [name], MD;  Location: [facility];  Service: Gynecology;  Laterality: Right;   WISDOM TOOTH EXTRACTION      Social History   Socioeconomic History   Marital status: Single    Spouse name: Not on file   Number of children:  1   Years of education: Some College   Highest education level: Not on file  Occupational History   Occupation: [employer]  Tobacco Use   Smoking status: Never   Smokeless tobacco: Never  Vaping Use   Vaping Use: Never used  Substance and Sexual Activity   Alcohol use: Yes    Comment: occ   Drug use: No   Sexual activity: Not on file  Other Topics Concern   Not on file  Social History Narrative   ** Merged History Encounter **       Lives at home with her son Caffeine use: none Right-handed   Social Determinants of Corporate investment banker Strain: Not on file  Food Insecurity: Not on file  Transportation Needs: Not on file  Physical Activity: Not on file  Stress: Not on file   Social Connections: Not on file  Intimate Partner Violence: Not on file    Family History  Problem Relation Age of Onset   Diabetes Mother    Hyperlipidemia Father    Cancer Maternal Grandfather    Hyperlipidemia Paternal Grandfather    Diabetes Maternal Grandmother    Anesthesia problems Neg Hx    Hypotension Neg Hx    Malignant hyperthermia Neg Hx    Pseudochol deficiency Neg Hx     Allergies  Allergen Reactions   Cephalexin Hives   Latex Itching, Swelling and Rash   Keflex [Cephalexin] Hives   Latex Itching    Review of Systems  Constitutional:  Positive for malaise/fatigue.  All other systems reviewed and are negative.      Objective:   BP 122/70   Pulse 86   Ht 5\' 4"  (1.626 m)   Wt 217 lb 6.4 oz (98.6 kg)   LMP 04/25/2013   SpO2 99%   BMI 37.32 kg/m   Vitals:   01/04/23 0922  BP: 122/70  Pulse: 86  Height: 5\' 4"  (1.626 m)  Weight: 217 lb 6.4 oz (98.6 kg)  SpO2: 99%  BMI (Calculated): 37.3    Physical Exam Vitals and nursing note reviewed.  Constitutional:      Appearance: Normal appearance. She is obese.  HENT:     Head: Normocephalic.  Eyes:     General:        Right eye: Hordeolum present.     Extraocular Movements: Extraocular movements intact.     Conjunctiva/sclera: Conjunctivae normal.     Pupils: Pupils are equal, round, and reactive to light.  Cardiovascular:     Rate and Rhythm: Normal rate.  Pulmonary:     Effort: Pulmonary effort is normal.  Neurological:     General: No focal deficit present.     Mental Status: She is alert and oriented to person, place, and time. Mental status is at baseline.  Psychiatric:        Attention and Perception: Perception normal. She is inattentive.        Mood and Affect: Mood and affect normal.        Speech: Speech normal.        Behavior: Behavior normal. Behavior is cooperative.        Thought Content: Thought content normal.        Cognition and Memory: Cognition and memory normal.         Judgment: Judgment normal.      Results for orders placed or performed in visit on 01/04/23  CBC With Diff/Platelet  Result Value Ref Range   WBC 6.2  3.4 - 10.8 x10E3/uL   RBC 5.07 3.77 - 5.28 x10E6/uL   Hemoglobin 9.4 (L) 11.1 - 15.9 g/dL   Hematocrit 16.1 (L) 09.6 - 46.6 %   MCV 64 (L) 79 - 97 fL   MCH 18.5 (L) 26.6 - 33.0 pg   MCHC 29.0 (L) 31.5 - 35.7 g/dL   RDW 04.5 (H) 40.9 - 81.1 %   Platelets 289 150 - 450 x10E3/uL   Neutrophils 52 Not Estab. %   Lymphs 38 Not Estab. %   Monocytes 6 Not Estab. %   Eos 3 Not Estab. %   Basos 1 Not Estab. %   Neutrophils Absolute 3.2 1.4 - 7.0 x10E3/uL   Lymphocytes Absolute 2.3 0.7 - 3.1 x10E3/uL   Monocytes Absolute 0.4 0.1 - 0.9 x10E3/uL   EOS (ABSOLUTE) 0.2 0.0 - 0.4 x10E3/uL   Basophils Absolute 0.1 0.0 - 0.2 x10E3/uL   Immature Granulocytes 0 Not Estab. %   Immature Grans (Abs) 0.0 0.0 - 0.1 x10E3/uL   NRBC 1 (H) 0 - 0 %  Iron, TIBC and Ferritin Panel  Result Value Ref Range   Total Iron Binding Capacity 237 (L) 250 - 450 ug/dL   UIBC 914 782 - 956 ug/dL   Iron 85 27 - 213 ug/dL   Iron Saturation 36 15 - 55 %   Ferritin 1,736 (H) 15 - 150 ng/mL    Recent Results (from the past 2160 hour(s))  Lipid panel     Status: Abnormal   Collection Time: 12/22/22  2:24 PM  Result Value Ref Range   Cholesterol, Total 169 100 - 199 mg/dL   Triglycerides 75 0 - 149 mg/dL   HDL 49 >08 mg/dL   VLDL Cholesterol Cal 14 5 - 40 mg/dL   LDL Chol Calc (NIH) 657 (H) 0 - 99 mg/dL   Chol/HDL Ratio 3.4 0.0 - 4.4 ratio    Comment:                                   T. Chol/HDL Ratio                                             Men  Women                               1/2 Avg.Risk  3.4    3.3                                   Avg.Risk  5.0    4.4                                2X Avg.Risk  9.6    7.1                                3X Avg.Risk 23.4   11.0   VITAMIN D 25 Hydroxy (Vit-D Deficiency, Fractures)     Status: None   Collection  Time: 12/22/22  2:24 PM  Result Value Ref Range   Vit D,  25-Hydroxy 34.6 30.0 - 100.0 ng/mL    Comment: Vitamin D deficiency has been defined by the Institute of Medicine and an Endocrine Society practice guideline as a level of serum 25-OH vitamin D less than 20 ng/mL (1,2). The Endocrine Society went on to further define vitamin D insufficiency as a level between 21 and 29 ng/mL (2). 1. IOM (Institute of Medicine). 2010. Dietary reference    intakes for calcium and D. [place]: The    [employer]. 2. Holick MF, Binkley Acadia, Bischoff-Ferrari HA, et al.    Evaluation, treatment, and prevention of vitamin D    deficiency: an Endocrine Society clinical practice    guideline. JCEM. 2011 Jul; 96(7):1911-30.   CBC With Differential     Status: Abnormal   Collection Time: 12/22/22  2:24 PM  Result Value Ref Range   WBC 6.4 3.4 - 10.8 x10E3/uL   RBC 5.38 (H) 3.77 - 5.28 x10E6/uL   Hemoglobin 9.8 (L) 11.1 - 15.9 g/dL   Hematocrit 14.7 (L) 82.9 - 46.6 %   MCV 63 (L) 79 - 97 fL   MCH 18.2 (L) 26.6 - 33.0 pg   MCHC 29.2 (L) 31.5 - 35.7 g/dL   RDW 56.2 (H) 13.0 - 86.5 %   Neutrophils 50 Not Estab. %   Lymphs 41 Not Estab. %   Monocytes 6 Not Estab. %   Eos 2 Not Estab. %   Basos 1 Not Estab. %   Neutrophils Absolute 3.2 1.4 - 7.0 x10E3/uL   Lymphocytes Absolute 2.6 0.7 - 3.1 x10E3/uL   Monocytes Absolute 0.4 0.1 - 0.9 x10E3/uL   EOS (ABSOLUTE) 0.1 0.0 - 0.4 x10E3/uL   Basophils Absolute 0.1 0.0 - 0.2 x10E3/uL   Immature Granulocytes 0 Not Estab. %   Immature Grans (Abs) 0.0 0.0 - 0.1 x10E3/uL  CMP14+EGFR     Status: None   Collection Time: 12/22/22  2:24 PM  Result Value Ref Range   Glucose 94 70 - 99 mg/dL   BUN 11 6 - 24 mg/dL   Creatinine, Ser 7.84 0.57 - 1.00 mg/dL   eGFR 696 >29 BM/WUX/3.24   BUN/Creatinine Ratio 16 9 - 23   Sodium 137 134 - 144 mmol/L   Potassium 4.9 3.5 - 5.2 mmol/L   Chloride 103 96 - 106 mmol/L   CO2 20 20 - 29 mmol/L   Calcium 9.5 8.7  - 10.2 mg/dL   Total Protein 6.7 6.0 - 8.5 g/dL   Albumin 4.2 3.9 - 4.9 g/dL   Globulin, Total 2.5 1.5 - 4.5 g/dL   Albumin/Globulin Ratio 1.7 1.2 - 2.2   Bilirubin Total 0.6 0.0 - 1.2 mg/dL   Alkaline Phosphatase 70 44 - 121 IU/L   AST 11 0 - 40 IU/L   ALT 12 0 - 32 IU/L  TSH     Status: None   Collection Time: 12/22/22  2:24 PM  Result Value Ref Range   TSH 1.260 0.450 - 4.500 uIU/mL  Hemoglobin A1c     Status: None   Collection Time: 12/22/22  2:24 PM  Result Value Ref Range   Hgb A1c MFr Bld 5.4 4.8 - 5.6 %    Comment:          Prediabetes: 5.7 - 6.4          Diabetes: >6.4          Glycemic control for adults with diabetes: <7.0    Est. average glucose Bld gHb Est-mCnc 108 mg/dL  Vitamin B12     Status: Abnormal   Collection Time: 12/22/22  2:24 PM  Result Value Ref Range   Vitamin B-12 >2000 (H) 232 - 1245 pg/mL  FSH+Prog+E2+SHBG     Status: None   Collection Time: 12/22/22  2:32 PM  Result Value Ref Range   FSH 7.7 mIU/mL    Comment:                      Adult Female             Range                       Follicular phase      3.5 -  12.5                       Ovulation phase       4.7 -  21.5                       Luteal phase          1.7 -   7.7                       Postmenopausal       25.8 - 134.8    Progesterone 1.7 ng/mL    Comment:                      Follicular phase       0.1 -   0.9                      Luteal phase           1.8 -  23.9                      Ovulation phase        0.1 -  12.0                      Pregnant                         First trimester    11.0 -  44.3                         Second trimester   25.4 -  83.3                         Third trimester    58.7 - 214.0                      Postmenopausal         0.0 -   0.1    Sex Hormone Binding 117.0 24.6 - 122.0 nmol/L   Estradiol 48.6 pg/mL    Comment:                      Adult Female             Range                       Follicular phase     12.5 - 166.0  Ovulation phase      85.8 - 498.0                       Luteal phase         43.8 - 211.0                       Postmenopausal       <6.0 -  54.7                      Pregnancy                       1st trimester     215.0 - >4300.0 Roche ECLIA methodology   CBC With Diff/Platelet     Status: Abnormal   Collection Time: 01/04/23 10:33 AM  Result Value Ref Range   WBC 6.2 3.4 - 10.8 x10E3/uL   RBC 5.07 3.77 - 5.28 x10E6/uL   Hemoglobin 9.4 (L) 11.1 - 15.9 g/dL   Hematocrit 16.1 (L) 09.6 - 46.6 %   MCV 64 (L) 79 - 97 fL   MCH 18.5 (L) 26.6 - 33.0 pg   MCHC 29.0 (L) 31.5 - 35.7 g/dL   RDW 04.5 (H) 40.9 - 81.1 %   Platelets 289 150 - 450 x10E3/uL   Neutrophils 52 Not Estab. %   Lymphs 38 Not Estab. %   Monocytes 6 Not Estab. %   Eos 3 Not Estab. %   Basos 1 Not Estab. %   Neutrophils Absolute 3.2 1.4 - 7.0 x10E3/uL   Lymphocytes Absolute 2.3 0.7 - 3.1 x10E3/uL   Monocytes Absolute 0.4 0.1 - 0.9 x10E3/uL   EOS (ABSOLUTE) 0.2 0.0 - 0.4 x10E3/uL   Basophils Absolute 0.1 0.0 - 0.2 x10E3/uL   Immature Granulocytes 0 Not Estab. %   Immature Grans (Abs) 0.0 0.0 - 0.1 x10E3/uL   NRBC 1 (H) 0 - 0 %  Iron, TIBC and Ferritin Panel     Status: Abnormal   Collection Time: 01/04/23 10:33 AM  Result Value Ref Range   Total Iron Binding Capacity 237 (L) 250 - 450 ug/dL   UIBC 914 782 - 956 ug/dL   Iron 85 27 - 213 ug/dL   Iron Saturation 36 15 - 55 %   Ferritin 1,736 (H) 15 - 150 ng/mL  Sedimentation rate     Status: None   Collection Time: 01/05/23 10:15 AM  Result Value Ref Range   Sed Rate 6 0 - 20 mm/hr    Comment: Performed at [facility], [address] [place], [place] [address]  Lactate dehydrogenase     Status: Abnormal   Collection Time: 01/05/23 10:15 AM  Result Value Ref Range   LDH 96 (L) 98 - 192 U/L    Comment: Performed at [facility], [address] [place], [place] [address]  Folate     Status: None   Collection Time: 01/05/23 10:15 AM   Result Value Ref Range   Folate 12.3 >5.9 ng/mL    Comment: Performed at [facility], [address] [place], [place] [address]  Iron and TIBC     Status: Abnormal   Collection Time: 01/05/23 10:15 AM  Result Value Ref Range   Iron 107 28 - 170 ug/dL   TIBC 413 244 - 010 ug/dL   Saturation Ratios 38 (H) 10.4 - 31.8 %   UIBC 174 ug/dL  Comment: Performed at [facility], [address] [place], [place] [address]  Ferritin     Status: Abnormal   Collection Time: 01/05/23 10:15 AM  Result Value Ref Range   Ferritin 1,398 (H) 11 - 307 ng/mL    Comment: Performed at [facility], [address] [place], [place] [address]  CBC with Differential/Platelet     Status: Abnormal   Collection Time: 01/05/23 10:15 AM  Result Value Ref Range   WBC 5.2 4.0 - 10.5 K/uL   RBC 5.16 (H) 3.87 - 5.11 MIL/uL   Hemoglobin 9.4 (L) 12.0 - 15.0 g/dL    Comment: Reticulocyte Hemoglobin testing may be clinically indicated, consider ordering this additional test [id number]    HCT 31.2 (L) 36.0 - 46.0 %   MCV 60.5 (L) 80.0 - 100.0 fL   MCH 18.2 (L) 26.0 - 34.0 pg   MCHC 30.1 30.0 - 36.0 g/dL   RDW 78.2 (H) 95.6 - 21.3 %   Platelets 264 150 - 400 K/uL   nRBC 0.6 (H) 0.0 - 0.2 %   Neutrophils Relative % 49 %   Neutro Abs 2.6 1.7 - 7.7 K/uL   Lymphocytes Relative 40 %   Lymphs Abs 2.1 0.7 - 4.0 K/uL   Monocytes Relative 6 %   Monocytes Absolute 0.3 0.1 - 1.0 K/uL   Eosinophils Relative 3 %   Eosinophils Absolute 0.2 0.0 - 0.5 K/uL   Basophils Relative 1 %   Basophils Absolute 0.0 0.0 - 0.1 K/uL   Immature Granulocytes 1 %   Abs Immature Granulocytes 0.03 0.00 - 0.07 K/uL    Comment: Performed at [facility], [address] [place], [place] [address]  Technologist smear review     Status: None   Collection Time: 01/05/23 10:15 AM  Result Value Ref Range   WBC MORPHOLOGY MORPHOLOGY UNREMARKABLE    RBC MORPHOLOGY      HYPOCHROMASIA, FEW POLYCHROMATOPHILIC  RBCS, SLIGHT BASOPHILLIC STIPPLING, FEW TARGET CELLS AND OCC TEARDROP CELLS NOTED.   Plt Morphology PLATELETS APPEAR ADEQUATE     Comment: Normal platelet morphology   Clinical Information anemia     Comment: Performed at [facility], [address] [place], [place] [address]  Retic Panel     Status: Abnormal   Collection Time: 01/05/23 10:16 AM  Result Value Ref Range   Retic Ct Pct 2.5 0.4 - 3.1 %   RBC. 5.13 (H) 3.87 - 5.11 MIL/uL   Retic Count, Absolute 130.3 19.0 - 186.0 K/uL   Immature Retic Fract 15.3 2.3 - 15.9 %   Reticulocyte Hemoglobin 17.8 (L) >27.9 pg    Comment:        A RET-He < 28 pg is an indication of iron-deficient or iron- insufficient erythropoiesis. Patients with thalassemia may also have a decreased RET-He result unrelated to iron availability.     If this patient has chronic kidney disease and does not have a hemoglobinopathy he/she meets criteria for iron deficiency per the 2016 NICE guidelines. Refer to specific guidelines to determine the appropriate thresholds for treating CKD- associated iron deficiency. TSAT and ferritin should be used in patients with hemoglobinopathies (e.g. thalassemia). Performed at [facility], [address] [place], [place] [address]        Assessment & Plan:   Problem List Items Addressed This Visit       Active Problems   Beta thalassemia [facility])    Referring patient to hematology/oncology here. She has not  had iron infusion in several years.  Rechecking her CBC and will add iron levels today.   Will reassess at follow up.        Relevant Orders   Ambulatory referral to Hematology / Oncology   CBC With Diff/Platelet (Completed)   Iron, TIBC and Ferritin Panel (Completed)   Localization-related idiopathic epilepsy and epileptic syndromes with seizures of localized onset, not intractable, with status epilepticus (HCC)    Patient is well managed on her current medications.  No recent seizures.   Refills were sent at last appt.   Will reassess at follow up.       Type 2 diabetes mellitus with hyperglycemia, without long-term current use of insulin (HCC) - Primary    Starting patient on Ozempic.  Will continue monitoring going forward.  Patient educated on foods that contain carbohydrates and the need to decrease intake.  We discussed prediabetes, and what it means and the need for strict dietary control to prevent progression to type 2 diabetes.  Advised to decrease intake of sugary drinks, including sodas, sweet tea, and some juices, and of starch and sugar heavy foods (ie., potatoes, rice, bread, pasta, desserts). She verbalizes understanding and agreement with the changes discussed today.   Reassess at follow up.       Relevant Medications   Semaglutide,0.25 or 0.5MG /DOS, 2 MG/3ML SOPN   Other Visit Diagnoses     Morbid obesity (HCC)   (Chronic)     Relevant Medications   Semaglutide,0.25 or 0.5MG /DOS, 2 MG/3ML SOPN   lisdexamfetamine (VYVANSE) 30 MG capsule   Encounter for neuropsychological testing       ADHD (attention deficit hyperactivity disorder), inattentive type           Return in about 1 month (around 02/04/2023).   Total time spent: 30 minutes  Miki Kins, FNP  01/04/2023   This document may have been prepared by [facility] Voice Recognition software and as such may include unintentional dictation errors.

## 2023-01-05 NOTE — Assessment & Plan Note (Addendum)
I will check hemoglobinopathy evaluation. This explains her chronic microcytosis and contributes to chronic anemia.

## 2023-01-05 NOTE — Assessment & Plan Note (Signed)
Patient is well managed on her current medications.  No recent seizures.  Refills were sent at last appt.   Will reassess at follow up.

## 2023-01-05 NOTE — Assessment & Plan Note (Signed)
Referring patient to hematology/oncology here. She has not had iron infusion in several years.  Rechecking her CBC and will add iron levels today.   Will reassess at follow up.

## 2023-01-05 NOTE — Progress Notes (Signed)
Hematology/Oncology Progress note [phone] [phone]         Patient Care Team: Miki Kins, FNP as PCP - General (Family Medicine) Maxie Better, MD as PCP - OBGYN (Obstetrics and Gynecology) [name], MD as Consulting Physician (Hematology and Oncology) Jeani Hawking, MD as Consulting Physician (Gastroenterology)   REFERRING PROVIDER: Miki Kins, FNP  CHIEF COMPLAINTS/REASON FOR VISIT:  Anemia  ASSESSMENT & PLAN:  Beta thalassemia (HCC) I will check hemoglobinopathy evaluation. This explains her chronic microcytosis and contributes to chronic anemia.  Microcytic anemia Likely secondary to beta thalassemia. Check CBC, iron, TIBC ferritin, folate, reticulocyte panel, multiple myeloma panel, light chain ratio, LDH, haptoglobin, copper, zinc, Patient has chronically elevated ferritin 1000 -2000s.  I am not convinced that she has iron deficiency.  Pending above workup.  Elevated ferritin Etiology unknown.  Could be secondary to previous iron infusions, versus chronic inflammation versus hereditary hemochromatosis. Will check hemochromatosis evaluation, inflammatory markers-ANA/CRP/ESR.  Orders Placed This Encounter  Procedures   CBC with Differential/Platelet    Standing Status:   Future    Number of Occurrences:   1    Standing Expiration Date:   01/05/2024   Ferritin    Standing Status:   Future    Number of Occurrences:   1    Standing Expiration Date:   07/08/2023   Iron and TIBC    Standing Status:   Future    Number of Occurrences:   1    Standing Expiration Date:   01/05/2024   Folate    Standing Status:   Future    Number of Occurrences:   1    Standing Expiration Date:   01/05/2024   Retic Panel    Standing Status:   Future    Number of Occurrences:   1    Standing Expiration Date:   01/05/2024   Multiple Myeloma Panel (SPEP&IFE w/QIG)    Standing Status:   Future    Number of Occurrences:   1     Standing Expiration Date:   01/05/2024   Kappa/lambda light chains    Standing Status:   Future    Number of Occurrences:   1    Standing Expiration Date:   01/05/2024   Lactate dehydrogenase    Standing Status:   Future    Number of Occurrences:   1    Standing Expiration Date:   01/05/2024   Haptoglobin    Standing Status:   Future    Number of Occurrences:   1    Standing Expiration Date:   01/05/2024   ANA, IFA (with reflex)    Standing Status:   Future    Number of Occurrences:   1    Standing Expiration Date:   01/05/2024   Copper, serum    Standing Status:   Future    Number of Occurrences:   1    Standing Expiration Date:   01/05/2024   C-reactive protein    Standing Status:   Future    Number of Occurrences:   1    Standing Expiration Date:   01/05/2024   Sedimentation rate    Standing Status:   Future    Number of Occurrences:   1    Standing Expiration Date:   01/05/2024   Technologist smear review    Standing Status:   Future    Number of Occurrences:   1    Standing Expiration Date:   01/05/2024  Order Specific Question:   Clinical information:    Answer:   anemia   Hgb Fractionation Cascade    Standing Status:   Future    Number of Occurrences:   1    Standing Expiration Date:   01/05/2024   Zinc    Standing Status:   Future    Number of Occurrences:   1    Standing Expiration Date:   01/05/2024   Hemochromatosis DNA-PCR(c282y,h63d)    Standing Status:   Future    Number of Occurrences:   1    Standing Expiration Date:   01/05/2024   Follow-up in 3 weeks to review results. All questions were answered. The patient knows to call the clinic with any problems, questions or concerns.  Rickard Patience, MD, PhD [facility] Hematology Oncology 01/05/2023     HISTORY OF PRESENTING ILLNESS:  Abigail Hall is a  42 y.o.  female with PMH listed below who was referred to me for anemia Review previous medical records with hematologist Dr. Myna Hidalgo Patient has a  longstanding history of chronic anemia, macrocytic.  She has a history of iron deficiency anemia in 2013/2014, urine for pregnancy.  She was treated with IV Feraheme treatments. She continues to receive IV iron treatment despite improved iron saturation and ferritin level, that was considered to be secondary to inflammation. Patient has a history of beta thalassemia minor. Patient wants to transfer her care to our facility. She reports feeling tired. Denies hematochezia, hematuria, hematemesis, epistaxis, black tarry stool or easy bruising.  She has hysterectomy She denies any autoimmune disease.  Patient denies being on any antiplatelet or anticoagulation agents. Patient has history of seizure for which she has been on Depakote.  She follows up with neurology Dr.Ahern.      MEDICAL HISTORY:  Past Medical History:  Diagnosis Date   Anemia    Anemia, iron deficiency 04/10/2012   Constipation    Depression 04/26/13   history - no current problems   Generalized headaches    Hemorrhoids    History of blood transfusion 02/2013   [facility]  2 units transfused    Nausea & vomiting    resolved after delivery - C/S, extreme   Rectal bleeding    Rectal pain    Seasonal allergies    Seizure (HCC)    Seizures (HCC)    last seizure was  08/2012   Thalassemia    Weakness    resolved after C/S delivery    SURGICAL HISTORY: Past Surgical History:  Procedure Laterality Date   ABDOMINAL HYSTERECTOMY     CARPAL TUNNEL RELEASE  01/2009   right   CESAREAN SECTION  12/31/2011   Procedure: CESAREAN SECTION;  Surgeon: Delbert Harness, MD;  Location: [facility];  Service: Gynecology;  Laterality: N/A;   DILATION AND CURETTAGE OF UTERUS     endometrial ablation   EVALUATION UNDER ANESTHESIA WITH HEMORRHOIDECTOMY N/A 05/31/2013   Procedure: EXAM UNDER ANESTHESIA WITH EXTERNAL HEMORRHOIDECTOMY;  Surgeon: Ardeth Sportsman, MD;  Location: [facility];  Service: General;  Laterality: N/A;   LABIOPLASTY   03/27/2012   Procedure: LABIAPLASTY;  Surgeon: Delbert Harness, MD;  Location: [facility];  Service: Gynecology;  Laterality: N/A;  labia   LAPAROSCOPIC APPENDECTOMY N/A 06/05/2020   Procedure: APPENDECTOMY LAPAROSCOPIC;  Surgeon: Kinsinger, De Blanch, MD;  Location: [facility];  Service: General;  Laterality: N/A;   LAPAROSCOPIC TUBAL LIGATION  03/27/2012   Procedure: LAPAROSCOPIC TUBAL LIGATION;  Surgeon: Delbert Harness,  MD;  Location: [facility];  Service: Gynecology;  Laterality: Bilateral;  fallopian tubes caurtery   LAPAROSCOPY     removal of cyst   ROBOTIC ASSISTED TOTAL HYSTERECTOMY N/A 05/02/2013   Procedure: ROBOTIC ASSISTED TOTAL HYSTERECTOMY;  Surgeon: [name], MD;  Location: [facility];  Service: Gynecology;  Laterality: N/A;   TONGUE SURGERY     TRANSANAL HEMORRHOIDAL DEARTERIALIZATION N/A 05/31/2013   Procedure: THD HEMORRHOIDAL LIGATION/PEXY;  Surgeon: Ardeth Sportsman, MD;  Location: [facility];  Service: General;  Laterality: N/A;   TUBAL LIGATION     UNILATERAL SALPINGECTOMY Right 05/02/2013   Procedure: UNILATERAL SALPINGECTOMY;  Surgeon: [name], MD;  Location: [facility];  Service: Gynecology;  Laterality: Right;   WISDOM TOOTH EXTRACTION      SOCIAL HISTORY: Social History   Socioeconomic History   Marital status: Single    Spouse name: Not on file   Number of children: 1   Years of education: Some College   Highest education level: Not on file  Occupational History   Occupation: [employer]  Tobacco Use   Smoking status: Never   Smokeless tobacco: Never  Vaping Use   Vaping Use: Never used  Substance and Sexual Activity   Alcohol use: Yes    Comment: occ   Drug use: No   Sexual activity: Not on file  Other Topics Concern   Not on file  Social History Narrative   ** Merged History Encounter **       Lives at home with her son Caffeine use: none Right-handed   Social Determinants of Corporate investment banker Strain: Not on file  Food Insecurity: Not on  file  Transportation Needs: Not on file  Physical Activity: Not on file  Stress: Not on file  Social Connections: Not on file  Intimate Partner Violence: Not on file    FAMILY HISTORY: Family History  Problem Relation Age of Onset   Diabetes Mother    Hyperlipidemia Father    Cancer Maternal Grandfather    Hyperlipidemia Paternal Grandfather    Diabetes Maternal Grandmother    Anesthesia problems Neg Hx    Hypotension Neg Hx    Malignant hyperthermia Neg Hx    Pseudochol deficiency Neg Hx     ALLERGIES:  is allergic to cephalexin, latex, keflex [cephalexin], and latex.  MEDICATIONS:  Current Outpatient Medications  Medication Sig Dispense Refill   acetaminophen (TYLENOL) 500 MG tablet Take 2 tablets (1,000 mg total) by mouth every 6 (six) hours as needed. 30 tablet 0   Biotin 34742 MCG TBDP Take 10,000 mcg by mouth daily.     DEPAKOTE ER 500 MG 24 hr tablet Take 2 tablets (1,000 mg total) by mouth daily. 180 tablet 4   divalproex (DEPAKOTE) 500 MG DR tablet      Fluocinolone Acetonide Scalp 0.01 % OIL Apply 1 application. topically daily as needed. APPLY EXTERNALLY TO THE AFFECTED AREA DAILY AS NEEDED Strength: 0.01% 118.28 mL 11   ketoconazole (NIZORAL) 2 % shampoo APPLY EXTERNALLY 2 TIMES A WEEK 120 mL 11   lisdexamfetamine (VYVANSE) 30 MG capsule Take 1 capsule (30 mg total) by mouth daily. 30 capsule 0   polyethylene glycol powder (GLYCOLAX/MIRALAX) 17 GM/SCOOP powder Take 1 Container by mouth daily as needed for mild constipation. 1 scoop as needed     Semaglutide,0.25 or 0.5MG /DOS, 2 MG/3ML SOPN Inject 0.25 mg into the skin once a week for 28 days, THEN 0.5 mg once a week for 14  days. 3 mL 0   tobramycin-dexamethasone (TOBRADEX) ophthalmic solution Place 1 drop into the right eye every 4 (four) hours while awake. 5 mL 0   No current facility-administered medications for this visit.    Review of Systems  Constitutional:  Positive for fatigue. Negative for appetite  change, chills and fever.  HENT:   Negative for hearing loss and voice change.   Eyes:  Negative for eye problems.  Respiratory:  Negative for chest tightness and cough.   Cardiovascular:  Negative for chest pain.  Gastrointestinal:  Negative for abdominal distention, abdominal pain and blood in stool.  Endocrine: Negative for hot flashes.  Genitourinary:  Negative for difficulty urinating and frequency.   Musculoskeletal:  Negative for arthralgias.  Skin:  Negative for itching and rash.  Neurological:  Negative for extremity weakness.  Hematological:  Negative for adenopathy.  Psychiatric/Behavioral:  Negative for confusion.     PHYSICAL EXAMINATION: Vitals:   01/05/23 0928  BP: 116/65  Pulse: 72  Resp: 18  Temp: (!) 96 F (35.6 C)  SpO2: 100%   Filed Weights   01/05/23 0928  Weight: 218 lb 9.6 oz (99.2 kg)    Physical Exam Constitutional:      General: She is not in acute distress. HENT:     Head: Normocephalic and atraumatic.  Eyes:     General: No scleral icterus. Cardiovascular:     Rate and Rhythm: Normal rate and regular rhythm.     Heart sounds: Normal heart sounds.  Pulmonary:     Effort: Pulmonary effort is normal. No respiratory distress.     Breath sounds: No wheezing.  Abdominal:     General: Bowel sounds are normal. There is no distension.     Palpations: Abdomen is soft.  Musculoskeletal:        General: No deformity. Normal range of motion.     Cervical back: Normal range of motion and neck supple.  Skin:    General: Skin is warm and dry.     Findings: No erythema or rash.  Neurological:     Mental Status: She is alert and oriented to person, place, and time. Mental status is at baseline.     Cranial Nerves: No cranial nerve deficit.     Coordination: Coordination normal.  Psychiatric:        Mood and Affect: Mood normal.      LABORATORY DATA:  I have reviewed the data as listed    Latest Ref Rng & Units 01/05/2023   10:15 AM 01/04/2023    10:33 AM 12/22/2022    2:24 PM  CBC  WBC 4.0 - 10.5 K/uL 5.2  6.2  6.4   Hemoglobin 12.0 - 15.0 g/dL 9.4  9.4  9.8   Hematocrit 36.0 - 46.0 % 31.2  32.4  33.6   Platelets 150 - 400 K/uL 264  289        Latest Ref Rng & Units 12/22/2022    2:24 PM 06/15/2021    4:24 PM 03/02/2021    9:42 AM  CMP  Glucose 70 - 99 mg/dL 94  82  95   BUN 6 - 24 mg/dL 11  11  11    Creatinine 0.57 - 1.00 mg/dL 6.29  5.28  4.13   Sodium 134 - 144 mmol/L 137  137  138   Potassium 3.5 - 5.2 mmol/L 4.9  5.0  4.2   Chloride 96 - 106 mmol/L 103  102  102   CO2 20 -  29 mmol/L 20  21  27    Calcium 8.7 - 10.2 mg/dL 9.5  9.4  9.6   Total Protein 6.0 - 8.5 g/dL 6.7  6.9  7.3   Total Bilirubin 0.0 - 1.2 mg/dL 0.6  0.4  0.6   Alkaline Phos 44 - 121 IU/L 70  63  54   AST 0 - 40 IU/L 11  13  9    ALT 0 - 32 IU/L 12  9  9     Lab Results  Component Value Date   IRON 107 01/05/2023   TIBC 281 01/05/2023   IRONPCTSAT 38 (H) 01/05/2023   FERRITIN 1,398 (H) 01/05/2023     RADIOGRAPHIC STUDIES: I have personally reviewed the radiological images as listed and agreed with the findings in the report. No results found.

## 2023-01-05 NOTE — Assessment & Plan Note (Addendum)
Likely secondary to beta thalassemia. Check CBC, iron, TIBC ferritin, folate, reticulocyte panel, multiple myeloma panel, light chain ratio, LDH, haptoglobin, copper, zinc, Patient has chronically elevated ferritin 1000 -2000s.  I am not convinced that she has iron deficiency.  Pending above workup.

## 2023-01-05 NOTE — Assessment & Plan Note (Signed)
Starting patient on Ozempic.  Will continue monitoring going forward.  Patient educated on foods that contain carbohydrates and the need to decrease intake.  We discussed prediabetes, and what it means and the need for strict dietary control to prevent progression to type 2 diabetes.  Advised to decrease intake of sugary drinks, including sodas, sweet tea, and some juices, and of starch and sugar heavy foods (ie., potatoes, rice, bread, pasta, desserts). She verbalizes understanding and agreement with the changes discussed today.   Reassess at follow up.

## 2023-01-05 NOTE — Telephone Encounter (Signed)
Patient left VM that [facility] did not have her Vyvanse in stock so please send to the [facility] in [place].

## 2023-01-05 NOTE — Telephone Encounter (Signed)
Patient left VM that only got one med sent to the pharmacy. Looks like one went to [facility] and one went to [facility]. Need to call patient and inform them.

## 2023-01-06 ENCOUNTER — Other Ambulatory Visit: Payer: Self-pay | Admitting: Family

## 2023-01-06 LAB — KAPPA/LAMBDA LIGHT CHAINS
Kappa free light chain: 15.5 mg/L (ref 3.3–19.4)
Kappa, lambda light chain ratio: 1.02 (ref 0.26–1.65)
Lambda free light chains: 15.2 mg/L (ref 5.7–26.3)

## 2023-01-06 LAB — COPPER, SERUM: Copper: 99 ug/dL (ref 80–158)

## 2023-01-06 LAB — ZINC: Zinc: 64 ug/dL (ref 44–115)

## 2023-01-06 LAB — HAPTOGLOBIN: Haptoglobin: 124 mg/dL (ref 42–296)

## 2023-01-06 MED ORDER — LISDEXAMFETAMINE DIMESYLATE 30 MG PO CAPS: 30.0000 mg | ORAL_CAPSULE | Freq: Every day | ORAL | 0 refills | Status: DC

## 2023-01-07 LAB — HGB FRACTIONATION CASCADE
Hgb A2: 4.9 % — ABNORMAL HIGH (ref 1.8–3.2)
Hgb A: 93.6 % — ABNORMAL LOW (ref 96.4–98.8)
Hgb F: 1.5 % (ref 0.0–2.0)
Hgb S: 0 %

## 2023-01-07 LAB — ANTINUCLEAR ANTIBODIES, IFA: ANA Ab, IFA: NEGATIVE

## 2023-01-14 LAB — MULTIPLE MYELOMA PANEL, SERUM
Albumin SerPl Elph-Mcnc: 3.7 g/dL (ref 2.9–4.4)
Albumin/Glob SerPl: 1.4 (ref 0.7–1.7)
Alpha 1: 0.2 g/dL (ref 0.0–0.4)
Alpha2 Glob SerPl Elph-Mcnc: 0.6 g/dL (ref 0.4–1.0)
B-Globulin SerPl Elph-Mcnc: 0.9 g/dL (ref 0.7–1.3)
Gamma Glob SerPl Elph-Mcnc: 1 g/dL (ref 0.4–1.8)
Globulin, Total: 2.7 g/dL (ref 2.2–3.9)
IgA: 120 mg/dL (ref 87–352)
IgG (Immunoglobin G), Serum: 1087 mg/dL (ref 586–1602)
IgM (Immunoglobulin M), Srm: 49 mg/dL (ref 26–217)
Total Protein ELP: 6.4 g/dL (ref 6.0–8.5)

## 2023-01-19 ENCOUNTER — Encounter: Payer: Self-pay | Admitting: Family

## 2023-01-26 ENCOUNTER — Other Ambulatory Visit: Payer: Self-pay

## 2023-01-26 ENCOUNTER — Inpatient Hospital Stay: Payer: Medicaid Other | Attending: Oncology | Admitting: Oncology

## 2023-01-26 ENCOUNTER — Inpatient Hospital Stay: Payer: Medicaid Other

## 2023-01-26 ENCOUNTER — Encounter: Payer: Self-pay | Admitting: Oncology

## 2023-01-26 VITALS — BP 109/72 | HR 68 | Temp 96.4°F | Resp 18 | Wt 209.9 lb

## 2023-01-26 DIAGNOSIS — D561 Beta thalassemia: Secondary | ICD-10-CM | POA: Diagnosis present

## 2023-01-26 DIAGNOSIS — R569 Unspecified convulsions: Secondary | ICD-10-CM | POA: Diagnosis not present

## 2023-01-26 DIAGNOSIS — K625 Hemorrhage of anus and rectum: Secondary | ICD-10-CM | POA: Insufficient documentation

## 2023-01-26 DIAGNOSIS — R112 Nausea with vomiting, unspecified: Secondary | ICD-10-CM | POA: Diagnosis not present

## 2023-01-26 DIAGNOSIS — D563 Thalassemia minor: Secondary | ICD-10-CM

## 2023-01-26 DIAGNOSIS — D509 Iron deficiency anemia, unspecified: Secondary | ICD-10-CM | POA: Insufficient documentation

## 2023-01-26 DIAGNOSIS — R5383 Other fatigue: Secondary | ICD-10-CM | POA: Insufficient documentation

## 2023-01-26 DIAGNOSIS — R7989 Other specified abnormal findings of blood chemistry: Secondary | ICD-10-CM | POA: Diagnosis not present

## 2023-01-26 DIAGNOSIS — Z79899 Other long term (current) drug therapy: Secondary | ICD-10-CM | POA: Diagnosis not present

## 2023-01-26 DIAGNOSIS — R531 Weakness: Secondary | ICD-10-CM | POA: Diagnosis not present

## 2023-01-26 LAB — LACTATE DEHYDROGENASE: LDH: 107 U/L (ref 98–192)

## 2023-01-26 LAB — HEPATIC FUNCTION PANEL
ALT: 17 U/L (ref 0–44)
AST: 17 U/L (ref 15–41)
Albumin: 4.1 g/dL (ref 3.5–5.0)
Alkaline Phosphatase: 54 U/L (ref 38–126)
Bilirubin, Direct: 0.1 mg/dL (ref 0.0–0.2)
Indirect Bilirubin: 0.7 mg/dL (ref 0.3–0.9)
Total Bilirubin: 0.8 mg/dL (ref 0.3–1.2)
Total Protein: 7 g/dL (ref 6.5–8.1)

## 2023-01-26 NOTE — Progress Notes (Signed)
Pt here for follow up. No new concerns voiced.   

## 2023-01-26 NOTE — Assessment & Plan Note (Signed)
Recommend family member to be screened for thalassemia This explains her chronic microcytosis and contributes to chronic anemia.

## 2023-01-26 NOTE — Assessment & Plan Note (Addendum)
Anemia could be secondary to beta thalassemia minor. Hemoglobin is slightly lower than expected mild anemia in thalassemia minor patient.  I wonder if Depakote may have some immunosuppression and attributes to some anemia as well. Recommend checking DNA sequencing for thalassemia, LDH, haptoglobin,

## 2023-01-26 NOTE — Progress Notes (Signed)
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 Hemoglobin is slightly lower than expected mild anemia in thalassemia minor patient.  I wonder if Depakote may have some immunosuppression and attributes to some anemia as well. 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  Elevated ferritin Etiology unknown.  Could be secondary to previous iron infusions, versus chronic inflammation versus hereditary hemochromatosis, effective erythrocytosis. Hemochromatosis DNA mutation testing is pending. inflammatory markers-ANA/CRP/ESR were all within normal limits. Recommend to avoid alcohol use, iron supplementation, vitamin C supplementation. Recommend ultrasound abdomen  Orders Placed This Encounter  Procedures   US Abdomen Complete    Standing Status:   Future    Standing Expiration Date:   01/26/2024    Order Specific Question:   Reason for Exam (SYMPTOM  OR DIAGNOSIS REQUIRED)    Answer:   thalassemia minor, elevated ferritin    Order Specific Question:   Preferred imaging location?    Answer:   [facility]   Lactate dehydrogenase    Standing Status:   Future    Number of Occurrences:   1    Standing Expiration Date:   01/26/2024   Haptoglobin    Standing Status:   Future    Number of Occurrences:   1    Standing Expiration Date:   01/26/2024    Hepatic function panel    Standing Status:   Future    Number of Occurrences:   1    Standing Expiration Date:   01/26/2024   Miscellaneous [facility] test (send-out)    Standing Status:   Future    Number of Occurrences:   1    Standing Expiration Date:   01/26/2024    Order Specific Question:   Test name / description:    Answer:   -Thalassemia: HBB (Full Gene Sequencing) [facility] [id number]   Follow-up in 3 months All questions were answered. The patient knows to call the clinic with any problems, questions or concerns.  Rickard Patience, MD, PhD [facility] Hematology Oncology 01/26/2023     HISTORY OF PRESENTING ILLNESS:  Abigail Hall is a  42 y.o.  female with PMH listed below who was referred to me for anemia Review previous medical records with hematologist Dr. Myna Hidalgo Patient has a longstanding history of chronic anemia, macrocytic.  She has a history of iron deficiency anemia in 2013/2014, urine for pregnancy.  She was treated with IV Feraheme treatments. She continues to receive IV iron treatment despite improved iron saturation and ferritin level, that was considered to be secondary to inflammation. Patient has a history of beta thalassemia minor. Patient wants to transfer her care to our facility. She reports feeling tired. Denies hematochezia, hematuria, hematemesis, epistaxis, black tarry stool or easy bruising.  She has hysterectomy She denies any autoimmune disease.  Patient denies being on any antiplatelet or anticoagulation agents. Patient has history of seizure for which she has been on Depakote.  She follows up with neurology Dr.Ahern.  INTERVAL HISTORY Abigail Hall is a 42 y.o. female who has above history reviewed by me today presents for follow up visit for beta thalassemia minor.  Patient had workup done and presents to discuss results.  MEDICAL HISTORY:  Past Medical History:  Diagnosis Date   Anemia    Anemia, iron deficiency 04/10/2012   Constipation     Depression 04/26/13   history - no current problems   Generalized headaches    Hemorrhoids    History of blood transfusion 02/2013   [name]  2 units transfused    Nausea & vomiting    resolved after delivery - C/S, extreme   Rectal bleeding    Rectal pain    Seasonal allergies    Seizure (HCC)    Seizures (HCC)    last seizure was  08/2012   Thalassemia    Weakness    resolved after C/S delivery    SURGICAL HISTORY: Past Surgical History:  Procedure Laterality Date   ABDOMINAL HYSTERECTOMY     CARPAL TUNNEL RELEASE  01/2009   right   CESAREAN SECTION  12/31/2011   Procedure: CESAREAN SECTION;  Surgeon: Delbert Harness, MD;  Location: [facility];  Service: Gynecology;  Laterality: N/A;   DILATION AND CURETTAGE OF UTERUS     endometrial ablation   EVALUATION UNDER ANESTHESIA WITH HEMORRHOIDECTOMY N/A 05/31/2013   Procedure: EXAM UNDER ANESTHESIA WITH EXTERNAL HEMORRHOIDECTOMY;  Surgeon: Ardeth Sportsman, MD;  Location: [facility];  Service: General;  Laterality: N/A;   LABIOPLASTY  03/27/2012   Procedure: LABIAPLASTY;  Surgeon: Delbert Harness, MD;  Location: [facility];  Service: Gynecology;  Laterality: N/A;  labia   LAPAROSCOPIC APPENDECTOMY N/A 06/05/2020   Procedure: APPENDECTOMY LAPAROSCOPIC;  Surgeon: Kinsinger, De Blanch, MD;  Location: [facility];  Service: General;  Laterality: N/A;   LAPAROSCOPIC TUBAL LIGATION  03/27/2012   Procedure: LAPAROSCOPIC TUBAL LIGATION;  Surgeon: Delbert Harness, MD;  Location: [facility];  Service: Gynecology;  Laterality: Bilateral;  fallopian tubes caurtery   LAPAROSCOPY     removal of cyst   ROBOTIC ASSISTED TOTAL HYSTERECTOMY N/A 05/02/2013   Procedure: ROBOTIC ASSISTED TOTAL HYSTERECTOMY;  Surgeon: [name], MD;  Location: [facility];  Service: Gynecology;  Laterality: N/A;   TONGUE SURGERY     TRANSANAL HEMORRHOIDAL DEARTERIALIZATION N/A 05/31/2013   Procedure: THD HEMORRHOIDAL LIGATION/PEXY;  Surgeon: Ardeth Sportsman, MD;  Location: [facility];   Service: General;  Laterality: N/A;   TUBAL LIGATION     UNILATERAL SALPINGECTOMY Right 05/02/2013   Procedure: UNILATERAL SALPINGECTOMY;  Surgeon: [name], MD;  Location: [facility];  Service: Gynecology;  Laterality: Right;   WISDOM TOOTH EXTRACTION      SOCIAL HISTORY: Social History   Socioeconomic History   Marital status: Single    Spouse name: Not on file   Number of children: 1   Years of education: Some College   Highest education level: Not on file  Occupational History   Occupation: [employer]  Tobacco Use   Smoking status: Never   Smokeless tobacco: Never  Vaping Use   Vaping Use: Never used  Substance and Sexual Activity   Alcohol use: Yes    Comment: occ   Drug use: No   Sexual activity: Not on file  Other Topics Concern   Not on file  Social History Narrative   ** Merged History Encounter **       Lives at home with her son Caffeine use: none Right-handed  Social Determinants of Health   Financial Resource Strain: Not on file  Food Insecurity: Not on file  Transportation Needs: Not on file  Physical Activity: Not on file  Stress: Not on file  Social Connections: Not on file  Intimate Partner Violence: Not on file    FAMILY HISTORY: Family History  Problem Relation Age of Onset   Diabetes Mother    Hyperlipidemia Father    Cancer Maternal Grandfather    Hyperlipidemia Paternal Grandfather    Diabetes Maternal Grandmother    Anesthesia problems Neg Hx    Hypotension Neg Hx    Malignant hyperthermia Neg Hx    Pseudochol deficiency Neg Hx     ALLERGIES:  is allergic to cephalexin, latex, keflex [cephalexin], and latex.  MEDICATIONS:  Current Outpatient Medications  Medication Sig Dispense Refill   acetaminophen (TYLENOL) 500 MG tablet Take 2 tablets (1,000 mg total) by mouth every 6 (six) hours as needed. 30 tablet 0   Biotin 40981 MCG TBDP Take 10,000 mcg by mouth daily.     DEPAKOTE ER 500 MG 24 hr tablet Take 2 tablets (1,000 mg  total) by mouth daily. 180 tablet 4   Fluocinolone Acetonide Scalp 0.01 % OIL Apply 1 application. topically daily as needed. APPLY EXTERNALLY TO THE AFFECTED AREA DAILY AS NEEDED Strength: 0.01% 118.28 mL 11   ketoconazole (NIZORAL) 2 % shampoo APPLY EXTERNALLY 2 TIMES A WEEK 120 mL 11   lisdexamfetamine (VYVANSE) 30 MG capsule Take 1 capsule (30 mg total) by mouth daily. 30 capsule 0   polyethylene glycol powder (GLYCOLAX/MIRALAX) 17 GM/SCOOP powder Take 1 Container by mouth daily as needed for mild constipation. 1 scoop as needed     Semaglutide,0.25 or 0.5MG /DOS, 2 MG/3ML SOPN Inject 0.25 mg into the skin once a week for 28 days, THEN 0.5 mg once a week for 14 days. 3 mL 0   tobramycin-dexamethasone (TOBRADEX) ophthalmic solution Place 1 drop into the right eye every 4 (four) hours while awake. 5 mL 0   divalproex (DEPAKOTE) 500 MG DR tablet  (Patient not taking: Reported on 01/26/2023)     No current facility-administered medications for this visit.    Review of Systems  Constitutional:  Positive for fatigue. Negative for appetite change, chills and fever.  HENT:   Negative for hearing loss and voice change.   Eyes:  Negative for eye problems.  Respiratory:  Negative for chest tightness and cough.   Cardiovascular:  Negative for chest pain.  Gastrointestinal:  Negative for abdominal distention, abdominal pain and blood in stool.  Endocrine: Negative for hot flashes.  Genitourinary:  Negative for difficulty urinating and frequency.   Musculoskeletal:  Negative for arthralgias.  Skin:  Negative for itching and rash.  Neurological:  Negative for extremity weakness.  Hematological:  Negative for adenopathy.  Psychiatric/Behavioral:  Negative for confusion.     PHYSICAL EXAMINATION: Vitals:   01/26/23 1030  BP: 109/72  Pulse: 68  Resp: 18  Temp: (!) 96.4 F (35.8 C)   Filed Weights   01/26/23 1030  Weight: 209 lb 14.4 oz (95.2 kg)    Physical Exam Constitutional:       General: She is not in acute distress. HENT:     Head: Normocephalic and atraumatic.  Eyes:     General: No scleral icterus. Cardiovascular:     Rate and Rhythm: Normal rate.  Pulmonary:     Effort: Pulmonary effort is normal. No respiratory distress.  Abdominal:     General:  There is no distension.  Musculoskeletal:        General: No deformity. Normal range of motion.     Cervical back: Normal range of motion and neck supple.  Skin:    Findings: No erythema or rash.  Neurological:     Mental Status: She is alert and oriented to person, place, and time. Mental status is at baseline.     Cranial Nerves: No cranial nerve deficit.     Coordination: Coordination normal.  Psychiatric:        Mood and Affect: Mood normal.      LABORATORY DATA:  I have reviewed the data as listed    Latest Ref Rng & Units 01/05/2023   10:15 AM 01/04/2023   10:33 AM 12/22/2022    2:24 PM  CBC  WBC 4.0 - 10.5 K/uL 5.2  6.2  6.4   Hemoglobin 12.0 - 15.0 g/dL 9.4  9.4  9.8   Hematocrit 36.0 - 46.0 % 31.2  32.4  33.6   Platelets 150 - 400 K/uL 264  289        Latest Ref Rng & Units 01/26/2023   11:09 AM 12/22/2022    2:24 PM 06/15/2021    4:24 PM  CMP  Glucose 70 - 99 mg/dL  94  82   BUN 6 - 24 mg/dL  11  11   Creatinine 1.61 - 1.00 mg/dL  0.96  0.45   Sodium 409 - 144 mmol/L  137  137   Potassium 3.5 - 5.2 mmol/L  4.9  5.0   Chloride 96 - 106 mmol/L  103  102   CO2 20 - 29 mmol/L  20  21   Calcium 8.7 - 10.2 mg/dL  9.5  9.4   Total Protein 6.5 - 8.1 g/dL 7.0  6.7  6.9   Total Bilirubin 0.3 - 1.2 mg/dL 0.8  0.6  0.4   Alkaline Phos 38 - 126 U/L 54  70  63   AST 15 - 41 U/L 17  11  13    ALT 0 - 44 U/L 17  12  9     Lab Results  Component Value Date   IRON 107 01/05/2023   TIBC 281 01/05/2023   IRONPCTSAT 38 (H) 01/05/2023   FERRITIN 1,398 (H) 01/05/2023     RADIOGRAPHIC STUDIES: I have personally reviewed the radiological images as listed and agreed with the findings in the report. No  results found.

## 2023-01-26 NOTE — Assessment & Plan Note (Addendum)
Etiology unknown.  Could be secondary to previous iron infusions, versus chronic inflammation versus hereditary hemochromatosis, effective erythrocytosis. Hemochromatosis DNA mutation testing is pending. inflammatory markers-ANA/CRP/ESR were all within normal limits. Recommend to avoid alcohol use, iron supplementation, vitamin C supplementation. Recommend ultrasound abdomen

## 2023-01-27 LAB — HAPTOGLOBIN: Haptoglobin: 130 mg/dL (ref 42–296)

## 2023-01-28 ENCOUNTER — Ambulatory Visit
Admission: RE | Admit: 2023-01-28 | Discharge: 2023-01-28 | Disposition: A | Discharge status: Home / Self Care | Payer: Medicaid Other | Source: Ambulatory Visit | Attending: Oncology | Admitting: Oncology

## 2023-01-28 DIAGNOSIS — R7989 Other specified abnormal findings of blood chemistry: Secondary | ICD-10-CM | POA: Insufficient documentation

## 2023-01-28 DIAGNOSIS — D563 Thalassemia minor: Secondary | ICD-10-CM | POA: Insufficient documentation

## 2023-01-28 LAB — HEMOCHROMATOSIS DNA-PCR(C282Y,H63D)

## 2023-01-29 ENCOUNTER — Other Ambulatory Visit: Payer: Self-pay | Admitting: Oncology

## 2023-01-29 DIAGNOSIS — R7989 Other specified abnormal findings of blood chemistry: Secondary | ICD-10-CM

## 2023-01-29 DIAGNOSIS — D563 Thalassemia minor: Secondary | ICD-10-CM

## 2023-02-01 ENCOUNTER — Ambulatory Visit: Payer: Medicaid Other | Admitting: Family

## 2023-02-01 ENCOUNTER — Encounter: Payer: Self-pay | Admitting: Family

## 2023-02-01 VITALS — BP 120/78 | HR 83 | Ht 64.0 in | Wt 211.0 lb

## 2023-02-01 DIAGNOSIS — K648 Other hemorrhoids: Secondary | ICD-10-CM

## 2023-02-01 DIAGNOSIS — L409 Psoriasis, unspecified: Secondary | ICD-10-CM

## 2023-02-01 DIAGNOSIS — R300 Dysuria: Secondary | ICD-10-CM | POA: Diagnosis not present

## 2023-02-01 DIAGNOSIS — R7989 Other specified abnormal findings of blood chemistry: Secondary | ICD-10-CM

## 2023-02-01 DIAGNOSIS — G40001 Localization-related (focal) (partial) idiopathic epilepsy and epileptic syndromes with seizures of localized onset, not intractable, with status epilepticus: Secondary | ICD-10-CM

## 2023-02-01 DIAGNOSIS — E1165 Type 2 diabetes mellitus with hyperglycemia: Secondary | ICD-10-CM

## 2023-02-01 DIAGNOSIS — D509 Iron deficiency anemia, unspecified: Secondary | ICD-10-CM

## 2023-02-01 LAB — POCT CBG (FASTING - GLUCOSE)-MANUAL ENTRY: Glucose Fasting, POC: 97 mg/dL (ref 70–99)

## 2023-02-01 LAB — POC CREATINE & ALBUMIN,URINE
Creatinine, POC: 300 mg/dL
Microalbumin Ur, POC: 150 mg/L

## 2023-02-01 MED ORDER — HYDROCORTISONE ACETATE 25 MG RE SUPP
25.0000 mg | Freq: Two times a day (BID) | RECTAL | 1 refills | Status: AC
Start: 2023-02-01 — End: 2024-02-01

## 2023-02-01 MED ORDER — KETOCONAZOLE 2 % EX SHAM
MEDICATED_SHAMPOO | CUTANEOUS | 11 refills | Status: AC
Start: 2023-02-01 — End: ?

## 2023-02-01 MED ORDER — SEMAGLUTIDE (1 MG/DOSE) 4 MG/3ML ~~LOC~~ SOPN
1.0000 mg | PEN_INJECTOR | SUBCUTANEOUS | 2 refills | Status: DC
Start: 2023-03-01 — End: 2023-06-28

## 2023-02-01 MED ORDER — SEMAGLUTIDE(0.25 OR 0.5MG/DOS) 2 MG/3ML ~~LOC~~ SOPN
0.5000 mg | PEN_INJECTOR | SUBCUTANEOUS | 0 refills | Status: DC
Start: 2023-02-01 — End: 2023-03-03

## 2023-02-01 NOTE — Progress Notes (Signed)
Established Patient Office Visit  Subjective:  Patient ID: Abigail Hall, female    DOB: [birth year]  Age: 42 y.o. MRN: [id number]  Chief Complaint  Patient presents with   Follow-up    4 month follow up    Patient is here today for her 1 month follow up.  She has been feeling well since last appointment.   She does not have additional concerns to discuss today.  She has been to see hematology, and they have drawn additional lab work to see what is happening with her blood counts and iron. Labs are not due today. She needs refills.   I have reviewed her active problem list, medication list, allergies, notes from last encounter, lab results, and specialist notes for her appointment today.      No other concerns at this time.   Past Medical History:  Diagnosis Date   Anemia    Anemia, iron deficiency 04/10/2012   Constipation    Depression 04/26/13   history - no current problems   Generalized headaches    Hemorrhoids    History of blood transfusion 02/2013   [facility]  2 units transfused    Nausea & vomiting    resolved after delivery - C/S, extreme   Rectal bleeding    Rectal pain    Seasonal allergies    Seizure (HCC)    Seizures (HCC)    last seizure was  08/2012   Thalassemia    Weakness    resolved after C/S delivery    Past Surgical History:  Procedure Laterality Date   ABDOMINAL HYSTERECTOMY     CARPAL TUNNEL RELEASE  01/2009   right   CESAREAN SECTION  12/31/2011   Procedure: CESAREAN SECTION;  Surgeon: Delbert Harness, MD;  Location: [facility];  Service: Gynecology;  Laterality: N/A;   DILATION AND CURETTAGE OF UTERUS     endometrial ablation   EVALUATION UNDER ANESTHESIA WITH HEMORRHOIDECTOMY N/A 05/31/2013   Procedure: EXAM UNDER ANESTHESIA WITH EXTERNAL HEMORRHOIDECTOMY;  Surgeon: Ardeth Sportsman, MD;  Location: [facility];  Service: General;  Laterality: N/A;   LABIOPLASTY  03/27/2012   Procedure: LABIAPLASTY;  Surgeon: Delbert Harness, MD;   Location: [facility];  Service: Gynecology;  Laterality: N/A;  labia   LAPAROSCOPIC APPENDECTOMY N/A 06/05/2020   Procedure: APPENDECTOMY LAPAROSCOPIC;  Surgeon: Kinsinger, De Blanch, MD;  Location: [facility];  Service: General;  Laterality: N/A;   LAPAROSCOPIC TUBAL LIGATION  03/27/2012   Procedure: LAPAROSCOPIC TUBAL LIGATION;  Surgeon: Delbert Harness, MD;  Location: [facility];  Service: Gynecology;  Laterality: Bilateral;  fallopian tubes caurtery   LAPAROSCOPY     removal of cyst   ROBOTIC ASSISTED TOTAL HYSTERECTOMY N/A 05/02/2013   Procedure: ROBOTIC ASSISTED TOTAL HYSTERECTOMY;  Surgeon: [name], MD;  Location: [facility];  Service: Gynecology;  Laterality: N/A;   TONGUE SURGERY     TRANSANAL HEMORRHOIDAL DEARTERIALIZATION N/A 05/31/2013   Procedure: THD HEMORRHOIDAL LIGATION/PEXY;  Surgeon: Ardeth Sportsman, MD;  Location: [facility];  Service: General;  Laterality: N/A;   TUBAL LIGATION     UNILATERAL SALPINGECTOMY Right 05/02/2013   Procedure: UNILATERAL SALPINGECTOMY;  Surgeon: [name], MD;  Location: [facility];  Service: Gynecology;  Laterality: Right;   WISDOM TOOTH EXTRACTION      Social History   Socioeconomic History   Marital status: Single    Spouse name: Not on file   Number of children: 1   Years of education: Some Automotive engineer  Highest education level: Not on file  Occupational History   Occupation: [employer]  Tobacco Use   Smoking status: Never   Smokeless tobacco: Never  Vaping Use   Vaping Use: Never used  Substance and Sexual Activity   Alcohol use: Yes    Comment: occ   Drug use: No   Sexual activity: Not on file  Other Topics Concern   Not on file  Social History Narrative   ** Merged History Encounter **       Lives at home with her son Caffeine use: none Right-handed   Social Determinants of Corporate investment banker Strain: Not on file  Food Insecurity: Not on file  Transportation Needs: Not on file  Physical Activity: Not on file  Stress:  Not on file  Social Connections: Not on file  Intimate Partner Violence: Not on file    Family History  Problem Relation Age of Onset   Diabetes Mother    Hyperlipidemia Father    Cancer Maternal Grandfather    Hyperlipidemia Paternal Grandfather    Diabetes Maternal Grandmother    Anesthesia problems Neg Hx    Hypotension Neg Hx    Malignant hyperthermia Neg Hx    Pseudochol deficiency Neg Hx     Allergies  Allergen Reactions   Cephalexin Hives   Latex Itching, Swelling and Rash   Keflex [Cephalexin] Hives   Latex Itching    Review of Systems  All other systems reviewed and are negative.      Objective:   BP 120/78   Pulse 83   Ht 5\' 4"  (1.626 m)   Wt 211 lb (95.7 kg)   LMP 04/25/2013   SpO2 96%   BMI 36.22 kg/m   Vitals:   02/01/23 1025  BP: 120/78  Pulse: 83  Height: 5\' 4"  (1.626 m)  Weight: 211 lb (95.7 kg)  SpO2: 96%  BMI (Calculated): 36.2    Physical Exam Vitals and nursing note reviewed.  Constitutional:      Appearance: Normal appearance. She is normal weight.  HENT:     Head: Normocephalic.  Eyes:     Pupils: Pupils are equal, round, and reactive to light.  Cardiovascular:     Rate and Rhythm: Normal rate.  Pulmonary:     Effort: Pulmonary effort is normal.  Neurological:     General: No focal deficit present.     Mental Status: She is alert and oriented to person, place, and time. Mental status is at baseline.  Psychiatric:        Mood and Affect: Mood normal.        Thought Content: Thought content normal.        Judgment: Judgment normal.      Results for orders placed or performed in visit on 02/01/23  Urine Culture   Specimen: Urine, Clean Catch   UC  Result Value Ref Range   Urine Culture, Routine Final report    Organism ID, Bacteria Comment   Microscopic Examination  Result Value Ref Range   WBC, UA 0-5 0 - 5 /hpf   RBC, Urine 0-2 0 - 2 /hpf   Epithelial Cells (non renal) 0-10 0 - 10 /hpf   Casts None seen None  seen /lpf   Bacteria, UA Few None seen/Few  Urinalysis, Routine w reflex microscopic  Result Value Ref Range   Specific Gravity, UA 1.025 1.005 - 1.030   pH, UA 6.5 5.0 - 7.5   Color, UA Yellow Yellow  Appearance Ur Clear Clear   Leukocytes,UA Negative Negative   Protein,UA 1+ (A) Negative/Trace   Glucose, UA Negative Negative   Ketones, UA Trace (A) Negative   RBC, UA Negative Negative   Bilirubin, UA Negative Negative   Urobilinogen, Ur 1.0 0.2 - 1.0 mg/dL   Nitrite, UA Negative Negative   Microscopic Examination See below:   POCT CBG (Fasting - Glucose)  Result Value Ref Range   Glucose Fasting, POC 97 70 - 99 mg/dL  POC CREATINE & ALBUMIN,URINE  Result Value Ref Range   Microalbumin Ur, POC 150 mg/L   Creatinine, POC 300 mg/dL   Albumin/Creatinine Ratio, Urine, POC 30-300     Recent Results (from the past 2160 hour(s))  Lipid panel     Status: Abnormal   Collection Time: 12/22/22  2:24 PM  Result Value Ref Range   Cholesterol, Total 169 100 - 199 mg/dL   Triglycerides 75 0 - 149 mg/dL   HDL 49 >16 mg/dL   VLDL Cholesterol Cal 14 5 - 40 mg/dL   LDL Chol Calc (NIH) 109 (H) 0 - 99 mg/dL   Chol/HDL Ratio 3.4 0.0 - 4.4 ratio    Comment:                                   T. Chol/HDL Ratio                                             Men  Women                               1/2 Avg.Risk  3.4    3.3                                   Avg.Risk  5.0    4.4                                2X Avg.Risk  9.6    7.1                                3X Avg.Risk 23.4   11.0   VITAMIN D 25 Hydroxy (Vit-D Deficiency, Fractures)     Status: None   Collection Time: 12/22/22  2:24 PM  Result Value Ref Range   Vit D, 25-Hydroxy 34.6 30.0 - 100.0 ng/mL    Comment: Vitamin D deficiency has been defined by the Institute of Medicine and an Endocrine Society practice guideline as a level of serum 25-OH vitamin D less than 20 ng/mL (1,2). The Endocrine Society went on to further define vitamin  D insufficiency as a level between 21 and 29 ng/mL (2). 1. IOM (Institute of Medicine). 2010. Dietary reference    intakes for calcium and D. [place]: The    [employer]. 2. Holick MF, Binkley , Bischoff-Ferrari HA, et al.    Evaluation, treatment, and prevention of vitamin D    deficiency: an Endocrine Society clinical practice    guideline. JCEM. 2011 Jul; 96(7):1911-30.   CBC  With Differential     Status: Abnormal   Collection Time: 12/22/22  2:24 PM  Result Value Ref Range   WBC 6.4 3.4 - 10.8 x10E3/uL   RBC 5.38 (H) 3.77 - 5.28 x10E6/uL   Hemoglobin 9.8 (L) 11.1 - 15.9 g/dL   Hematocrit 16.1 (L) 09.6 - 46.6 %   MCV 63 (L) 79 - 97 fL   MCH 18.2 (L) 26.6 - 33.0 pg   MCHC 29.2 (L) 31.5 - 35.7 g/dL   RDW 04.5 (H) 40.9 - 81.1 %   Neutrophils 50 Not Estab. %   Lymphs 41 Not Estab. %   Monocytes 6 Not Estab. %   Eos 2 Not Estab. %   Basos 1 Not Estab. %   Neutrophils Absolute 3.2 1.4 - 7.0 x10E3/uL   Lymphocytes Absolute 2.6 0.7 - 3.1 x10E3/uL   Monocytes Absolute 0.4 0.1 - 0.9 x10E3/uL   EOS (ABSOLUTE) 0.1 0.0 - 0.4 x10E3/uL   Basophils Absolute 0.1 0.0 - 0.2 x10E3/uL   Immature Granulocytes 0 Not Estab. %   Immature Grans (Abs) 0.0 0.0 - 0.1 x10E3/uL  CMP14+EGFR     Status: None   Collection Time: 12/22/22  2:24 PM  Result Value Ref Range   Glucose 94 70 - 99 mg/dL   BUN 11 6 - 24 mg/dL   Creatinine, Ser 9.14 0.57 - 1.00 mg/dL   eGFR 782 >95 AO/ZHY/8.65   BUN/Creatinine Ratio 16 9 - 23   Sodium 137 134 - 144 mmol/L   Potassium 4.9 3.5 - 5.2 mmol/L   Chloride 103 96 - 106 mmol/L   CO2 20 20 - 29 mmol/L   Calcium 9.5 8.7 - 10.2 mg/dL   Total Protein 6.7 6.0 - 8.5 g/dL   Albumin 4.2 3.9 - 4.9 g/dL   Globulin, Total 2.5 1.5 - 4.5 g/dL   Albumin/Globulin Ratio 1.7 1.2 - 2.2   Bilirubin Total 0.6 0.0 - 1.2 mg/dL   Alkaline Phosphatase 70 44 - 121 IU/L   AST 11 0 - 40 IU/L   ALT 12 0 - 32 IU/L  TSH     Status: None   Collection Time: 12/22/22  2:24  PM  Result Value Ref Range   TSH 1.260 0.450 - 4.500 uIU/mL  Hemoglobin A1c     Status: None   Collection Time: 12/22/22  2:24 PM  Result Value Ref Range   Hgb A1c MFr Bld 5.4 4.8 - 5.6 %    Comment:          Prediabetes: 5.7 - 6.4          Diabetes: >6.4          Glycemic control for adults with diabetes: <7.0    Est. average glucose Bld gHb Est-mCnc 108 mg/dL  Vitamin H84     Status: Abnormal   Collection Time: 12/22/22  2:24 PM  Result Value Ref Range   Vitamin B-12 >2000 (H) 232 - 1245 pg/mL  FSH+Prog+E2+SHBG     Status: None   Collection Time: 12/22/22  2:32 PM  Result Value Ref Range   FSH 7.7 mIU/mL    Comment:                      Adult Female             Range                       Follicular phase  3.5 -  12.5                       Ovulation phase       4.7 -  21.5                       Luteal phase          1.7 -   7.7                       Postmenopausal       25.8 - 134.8    Progesterone 1.7 ng/mL    Comment:                      Follicular phase       0.1 -   0.9                      Luteal phase           1.8 -  23.9                      Ovulation phase        0.1 -  12.0                      Pregnant                         First trimester    11.0 -  44.3                         Second trimester   25.4 -  83.3                         Third trimester    58.7 - 214.0                      Postmenopausal         0.0 -   0.1    Sex Hormone Binding 117.0 24.6 - 122.0 nmol/L   Estradiol 48.6 pg/mL    Comment:                      Adult Female             Range                       Follicular phase     12.5 - 166.0                       Ovulation phase      85.8 - 498.0                       Luteal phase         43.8 - 211.0                       Postmenopausal       <6.0 -  54.7                      Pregnancy  1st trimester     215.0 - >4300.0 Roche ECLIA methodology   CBC With Diff/Platelet     Status: Abnormal   Collection Time:  01/04/23 10:33 AM  Result Value Ref Range   WBC 6.2 3.4 - 10.8 x10E3/uL   RBC 5.07 3.77 - 5.28 x10E6/uL   Hemoglobin 9.4 (L) 11.1 - 15.9 g/dL   Hematocrit 16.1 (L) 09.6 - 46.6 %   MCV 64 (L) 79 - 97 fL   MCH 18.5 (L) 26.6 - 33.0 pg   MCHC 29.0 (L) 31.5 - 35.7 g/dL   RDW 04.5 (H) 40.9 - 81.1 %   Platelets 289 150 - 450 x10E3/uL   Neutrophils 52 Not Estab. %   Lymphs 38 Not Estab. %   Monocytes 6 Not Estab. %   Eos 3 Not Estab. %   Basos 1 Not Estab. %   Neutrophils Absolute 3.2 1.4 - 7.0 x10E3/uL   Lymphocytes Absolute 2.3 0.7 - 3.1 x10E3/uL   Monocytes Absolute 0.4 0.1 - 0.9 x10E3/uL   EOS (ABSOLUTE) 0.2 0.0 - 0.4 x10E3/uL   Basophils Absolute 0.1 0.0 - 0.2 x10E3/uL   Immature Granulocytes 0 Not Estab. %   Immature Grans (Abs) 0.0 0.0 - 0.1 x10E3/uL   NRBC 1 (H) 0 - 0 %  Iron, TIBC and Ferritin Panel     Status: Abnormal   Collection Time: 01/04/23 10:33 AM  Result Value Ref Range   Total Iron Binding Capacity 237 (L) 250 - 450 ug/dL   UIBC 914 782 - 956 ug/dL   Iron 85 27 - 213 ug/dL   Iron Saturation 36 15 - 55 %   Ferritin 1,736 (H) 15 - 150 ng/mL  Hemochromatosis DNA-PCR(c282y,h63d)     Status: None   Collection Time: 01/05/23 10:15 AM  Result Value Ref Range   DNA Mutation Analysis Comment     Comment: (NOTE) Result: c.845G>A (p.Cys282Tyr) - Not Detected c.187C>G (p.His63Asp) - Not Detected c.193A>T (p.Ser65Cys) - Not Detected Not associated with increased risk to develop clinical symptoms of Hereditary Hemochromatosis. In symptomatic individuals, other causes of iron overload should be evaluated. See Additional Information and Comments. Additional Clinical Information: Hereditary hemochromatosis (HFE related) is an autosomal recessive iron storage disorder. Patients may have a genetic diagnosis of hereditary hemochromatosis and never show clinical symptoms. Clinical symptoms typically appear between 40 to 60 years in males and after menopause in females. Signs  and symptoms may include organ damage, primarily in the liver, risk for hepatocellular carcinoma, diabetes, and heart disease due to iron accumulation. Life expectancy may be decreased in individuals who develop cirrhosis. Treatment for clinically symptomatic individuals may include therapeutic phlebotomy. L iver transplant may be used to treat end stage liver failure. For preventive care, monitoring for iron overload is recommended for patients who are homozygous for c.845G>A (p.Cys282Tyr) and have yet to experience clinical symptoms. Comments: The most common HFE variants associated with hereditary hemochromatosis are c.845G>A (p.Cys282Tyr), c.187C>G (p.His63Asp), c.193A>T (p.Ser65Cys). While patients homozygous for c.845G>A (p.Cys282Tyr) are the most likely to present clinical symptoms, less than 10% develop clinically significant iron overload with tissue and organ damage. Genetic counseling is recommended to discuss the potential clinical implications of positive results, as well as recommendations for testing family members. Genetic Coordinators are available for health care providers to discuss results at [phone]). Test Details: Three variants analyzed: c.845G>A (p.Cys282Tyr), commonly referred to as C282Y c.187C>G (p.His63Asp), commonly referred to a s H63D c.193A>T (p.Ser65Cys), commonly referred to as S65C Methods/Limitations: DNA Analysis  of the HFE gene ([id number]) was performed by PCR amplification followed by restriction enzyme digestion analyses. Results must be combined with clinical information for the most accurate interpretation. Molecular-based testing is highly accurate, but as in any laboratory test, diagnostic errors may occur. False positive or false negative results may occur for reasons that include genetic variants, blood transfusions, bone marrow transplantation, somatic or tissue-specific mosaicism, mislabeled samples, or erroneous  representation of family relationships. This test was developed and its performance characteristics determined by [facility]. It has not been cleared or approved by the Food and Drug Administration. References: Kennyth Arnold, [address], Kowdley Clarnce Flock LW, [place]av[place] AS; American Association for the Study of Liver Diseases. Diagnosis and management of hemochromatosis: 2011  practice guideline by the American Association for the Study of Liver Diseases. Hepatology. 2011 Jul;54(1):328-43. doi: 10.1002/hep.24330. PMID: 81191478; PMCID: GNF6213086. [address] Brissot P, Swinkels DW, [employer], Kamarainen O, Patton S, Alonso I, Morris M, Keeney S. EMQN best practice guidelines for the molecular genetic diagnosis of hereditary hemochromatosis [facility]). Eur J Hum Genet. 2016 Apr;24(4):479-95. doi: 10.1038/ejhg.2015.128. Epub 2015 Jul 8. PMID: 57846962; PMCID: XBM8413244.    Reviewed by: Lowella Dell, PhD     Comment: (NOTE) Performed At: [employer] [address] [place], [place] [id number] Maurine Simmering MDPhD [id number]   Zinc     Status: None   Collection Time: 01/05/23 10:15 AM  Result Value Ref Range   Zinc 64 44 - 115 ug/dL    Comment: (NOTE) This test was developed and its performance characteristics determined by [facility]. It has not been cleared or approved by the Food and Drug Administration.                                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 Suggest hematologic and clinical correlation. Performed At: [facility] [address] [place], [place] [id number] Jolene Schimke MD AT:[id number]   Sedimentation rate     Status: None   Collection Time: 01/05/23 10:15 AM  Result Value Ref Range   Sed Rate 6 0 - 20 mm/hr    Comment: Performed at [facility], [address] [place], [place] [address]  C-reactive protein     Status: None   Collection Time: 01/05/23 10:15 AM  Result Value Ref Range   CRP 0.5 <1.0 mg/dL    Comment: Performed at [facility], [address] [place], [place] [address]  Copper, serum     Status: None   Collection Time: 01/05/23 10:15 AM  Result Value Ref Range   Copper 99 80 - 158 ug/dL    Comment: (NOTE) This test was developed and its performance characteristics determined by [facility]. It has not been cleared or approved by the Food and Drug Administration.  Detection Limit = 5 Performed At: [facility] [address] [place], [place] [id number] Jolene Schimke MD WU:[id number]   ANA, IFA (with reflex)     Status: None   Collection Time: 01/05/23 10:15 AM  Result Value Ref Range   ANA Ab, IFA Negative     Comment: (NOTE)                                     Negative   <1:80                                     Borderline  1:80                                     Positive   >1:80 ICAP nomenclature: AC-0 For more information about Hep-2 cell patterns use ANApatterns.org, the official website for the International Consensus on Antinuclear Antibody (ANA) Patterns (ICAP). Performed At: [facility] [address] [place], [place] [id number] Jolene Schimke MD [id number]   Haptoglobin     Status: None   Collection Time: 01/05/23 10:15 AM  Result Value Ref Range   Haptoglobin 124 42 - 296 mg/dL    Comment: (NOTE) Performed At: [facility] [address] [place], [place] [id number] Jolene Schimke MD [id number]   Lactate dehydrogenase     Status: Abnormal   Collection Time: 01/05/23 10:15 AM   Result Value Ref Range   LDH 96 (L) 98 - 192 U/L    Comment: Performed at [facility], [address] [place], [place] [address]  Kappa/lambda light chains     Status: None   Collection Time: 01/05/23 10:15 AM  Result Value Ref Range   Kappa free light chain 15.5 3.3 - 19.4 mg/L   Lambda free light chains 15.2 5.7 - 26.3 mg/L   Kappa, lambda light chain ratio 1.02 0.26 - 1.65    Comment: (NOTE) Performed At: [facility] [place] [address] [place], [place] [id number] Jolene Schimke MD PI:[id number]   Multiple Myeloma Panel (SPEP&IFE w/QIG)     Status: None   Collection Time: 01/05/23 10:15 AM  Result Value Ref Range   IgG (Immunoglobin G), Serum 1,087 586 - 1,602 mg/dL   IgA 630 87 - 160 mg/dL   IgM (Immunoglobulin M), Srm 49 26 - 217 mg/dL   Total Protein ELP 6.4 6.0 - 8.5 g/dL   Albumin SerPl Elph-Mcnc 3.7 2.9 - 4.4 g/dL   Alpha 1 0.2 0.0 - 0.4 g/dL   Alpha2 Glob SerPl Elph-Mcnc 0.6 0.4 - 1.0 g/dL   B-Globulin SerPl Elph-Mcnc 0.9 0.7 - 1.3 g/dL   Gamma Glob SerPl Elph-Mcnc 1.0 0.4 - 1.8 g/dL   M Protein SerPl Elph-Mcnc Not Observed Not Observed g/dL   Globulin, Total 2.7 2.2 - 3.9 g/dL   Albumin/Glob SerPl 1.4 0.7 - 1.7   IFE 1 Comment     Comment: (NOTE) The immunofixation pattern appears unremarkable. Evidence of monoclonal protein is not apparent.    Please Note Comment     Comment: (NOTE) Protein electrophoresis scan will follow via computer, mail, or courier delivery. Performed At: [facility] [address] [place], [place] [id number] Jolene Schimke MD [id number]   Folate     Status: None  Collection Time: 01/05/23 10:15 AM  Result Value Ref Range   Folate 12.3 >5.9 ng/mL    Comment: Performed at [facility], [address] [place], [place] [address]  Iron and TIBC     Status: Abnormal   Collection Time: 01/05/23 10:15 AM  Result Value Ref Range   Iron 107 28 - 170 ug/dL   TIBC 604 540 - 981 ug/dL   Saturation Ratios  38 (H) 10.4 - 31.8 %   UIBC 174 ug/dL    Comment: Performed at [facility], [address] [place], [place] [address]  Ferritin     Status: Abnormal   Collection Time: 01/05/23 10:15 AM  Result Value Ref Range   Ferritin 1,398 (H) 11 - 307 ng/mL    Comment: Performed at [facility], [address] [place], [place] [address]  CBC with Differential/Platelet     Status: Abnormal   Collection Time: 01/05/23 10:15 AM  Result Value Ref Range   WBC 5.2 4.0 - 10.5 K/uL   RBC 5.16 (H) 3.87 - 5.11 MIL/uL   Hemoglobin 9.4 (L) 12.0 - 15.0 g/dL    Comment: Reticulocyte Hemoglobin testing may be clinically indicated, consider ordering this additional test [id number]    HCT 31.2 (L) 36.0 - 46.0 %   MCV 60.5 (L) 80.0 - 100.0 fL   MCH 18.2 (L) 26.0 - 34.0 pg   MCHC 30.1 30.0 - 36.0 g/dL   RDW 84.6 (H) 96.2 - 95.2 %   Platelets 264 150 - 400 K/uL   nRBC 0.6 (H) 0.0 - 0.2 %   Neutrophils Relative % 49 %   Neutro Abs 2.6 1.7 - 7.7 K/uL   Lymphocytes Relative 40 %   Lymphs Abs 2.1 0.7 - 4.0 K/uL   Monocytes Relative 6 %   Monocytes Absolute 0.3 0.1 - 1.0 K/uL   Eosinophils Relative 3 %   Eosinophils Absolute 0.2 0.0 - 0.5 K/uL   Basophils Relative 1 %   Basophils Absolute 0.0 0.0 - 0.1 K/uL   Immature Granulocytes 1 %   Abs Immature Granulocytes 0.03 0.00 - 0.07 K/uL    Comment: Performed at [facility], [address] [place], [place] [address]  Technologist smear review     Status: None   Collection Time: 01/05/23 10:15 AM  Result Value Ref Range   WBC MORPHOLOGY MORPHOLOGY UNREMARKABLE    RBC MORPHOLOGY      HYPOCHROMASIA, FEW POLYCHROMATOPHILIC RBCS, SLIGHT BASOPHILLIC STIPPLING, FEW TARGET CELLS AND OCC TEARDROP CELLS NOTED.   Plt Morphology PLATELETS APPEAR ADEQUATE     Comment: Normal platelet morphology   Clinical Information anemia     Comment: Performed at [facility], [address] [place], [place] [address]  Retic Panel     Status:  Abnormal   Collection Time: 01/05/23 10:16 AM  Result Value Ref Range   Retic Ct Pct 2.5 0.4 - 3.1 %   RBC. 5.13 (H) 3.87 - 5.11 MIL/uL   Retic Count, Absolute 130.3 19.0 - 186.0 K/uL   Immature Retic Fract 15.3 2.3 - 15.9 %   Reticulocyte Hemoglobin 17.8 (L) >27.9 pg    Comment:        A RET-He < 28 pg is an indication of iron-deficient or iron- insufficient erythropoiesis. Patients with thalassemia may also have a decreased RET-He result unrelated to iron availability.     If this patient has chronic kidney disease and does not have a hemoglobinopathy he/she meets criteria  for iron deficiency per the 2016 NICE guidelines. Refer to specific guidelines to determine the appropriate thresholds for treating CKD- associated iron deficiency. TSAT and ferritin should be used in patients with hemoglobinopathies (e.g. thalassemia). Performed at [facility] [facility], [address] [place], [place] [address]   Hepatic function panel     Status: None   Collection Time: 01/26/23 11:09 AM  Result Value Ref Range   Total Protein 7.0 6.5 - 8.1 g/dL   Albumin 4.1 3.5 - 5.0 g/dL   AST 17 15 - 41 U/L   ALT 17 0 - 44 U/L   Alkaline Phosphatase 54 38 - 126 U/L   Total Bilirubin 0.8 0.3 - 1.2 mg/dL   Bilirubin, Direct 0.1 0.0 - 0.2 mg/dL   Indirect Bilirubin 0.7 0.3 - 0.9 mg/dL    Comment: Performed at [facility], [address] [place], [place] [address]  Haptoglobin     Status: None   Collection Time: 01/26/23 11:09 AM  Result Value Ref Range   Haptoglobin 130 42 - 296 mg/dL    Comment: (NOTE) Performed At: [facility] [address] [place], [place] [id number] Jolene Schimke MD [id number]   Lactate dehydrogenase     Status: None   Collection Time: 01/26/23 11:09 AM  Result Value Ref Range   LDH 107 98 - 192 U/L    Comment: Performed at [facility], [address] [place], [place] [address]  Miscellaneous [facility] test (send-out)     Status:  None   Collection Time: 01/26/23 11:09 AM  Result Value Ref Range   [facility] test code [id number]    [facility] test name BTHALASSEMIA:HBB(FULLGENE)SEQUENCE     Comment: Performed at [facility], [address] [place], [place] [address]   Misc [facility] result COMMENT     Comment: (NOTE) Test Ordered: [id number] Beta Thalassemia: HBB Routing                        Comment                   TG   The test has been completed and the results have been faxed to you. Performed At: [facility] [address] [place], [place] [id number] Jolene Schimke MD [id number] Performed At: [facility] [address] [place], [place] [id number] Maurine Simmering MDPhD [id number]   POCT CBG (Fasting - Glucose)     Status: Normal   Collection Time: 02/01/23 10:40 AM  Result Value Ref Range   Glucose Fasting, POC 97 70 - 99 mg/dL  POC CREATINE & ALBUMIN,URINE     Status: None   Collection Time: 02/01/23 11:34 AM  Result Value Ref Range   Microalbumin Ur, POC 150 mg/L   Creatinine, POC 300 mg/dL   Albumin/Creatinine Ratio, Urine, POC 30-300   Urinalysis, Routine w reflex microscopic     Status: Abnormal   Collection Time: 02/01/23  4:26 PM  Result Value Ref Range   Specific Gravity, UA 1.025 1.005 - 1.030   pH, UA 6.5 5.0 - 7.5   Color, UA Yellow Yellow   Appearance Ur Clear Clear   Leukocytes,UA Negative Negative   Protein,UA 1+ (A) Negative/Trace   Glucose, UA Negative Negative   Ketones, UA Trace (A) Negative   RBC, UA Negative Negative   Bilirubin, UA Negative Negative   Urobilinogen, Ur 1.0 0.2 - 1.0 mg/dL   Nitrite, UA Negative Negative   Microscopic Examination See  below:     Comment: Microscopic was indicated and was performed.  Microscopic Examination     Status: None   Collection Time: 02/01/23  4:26 PM  Result Value Ref Range   WBC, UA 0-5 0 - 5 /hpf   RBC, Urine 0-2 0 - 2 /hpf   Epithelial Cells (non renal) 0-10 0 - 10 /hpf   Casts None seen None seen /lpf   Bacteria,  UA Few None seen/Few  Urine Culture     Status: None   Collection Time: 02/01/23  4:28 PM   Specimen: Urine, Clean Catch   UC  Result Value Ref Range   Urine Culture, Routine Final report    Organism ID, Bacteria Comment     Comment: Mixed urogenital flora 25,000-50,000 colony forming units per mL        Assessment & Plan:   Problem List Items Addressed This Visit       Active Problems   Microcytic anemia (Chronic)    Patient is seen by hematology, who manage this condition.  She is well controlled with current therapy.   Will defer to them for further changes to plan of care.       Localization-related idiopathic epilepsy and epileptic syndromes with seizures of localized onset, not intractable, with status epilepticus (HCC)    Patient stable.  Well controlled with current therapy.   Continue current meds.        Type 2 diabetes mellitus with hyperglycemia, without long-term current use of insulin (HCC) - Primary    Continue current diabetes POC, as patient has been well controlled on current regimen.  Will adjust meds if needed based on labs.        Relevant Medications   Semaglutide,0.25 or 0.5MG /DOS, 2 MG/3ML SOPN   Semaglutide, 1 MG/DOSE, 4 MG/3ML SOPN (Start on 03/01/2023)   Other Relevant Orders   POCT CBG (Fasting - Glucose) (Completed)   POC CREATINE & ALBUMIN,URINE (Completed)   Elevated ferritin (Chronic)    Patient is seen by hematology, who manage this condition.  She is well controlled with current therapy.   Will defer to them for further changes to plan of care.       Morbid obesity (HCC)    Continue current meds.  Will adjust as needed based on results.  The patient is asked to make an attempt to improve diet and exercise patterns to aid in medical management of this problem. Addressed importance of increasing and maintaining water intake.       Relevant Medications   Semaglutide,0.25 or 0.5MG /DOS, 2 MG/3ML SOPN   Semaglutide, 1 MG/DOSE,  4 MG/3ML SOPN (Start on 03/01/2023)   Other Visit Diagnoses     Dysuria       Relevant Orders   Urinalysis, Routine w reflex microscopic (Completed)   Urine Culture (Completed)       Return in about 2 months (around 04/03/2023) for F/U.   Total time spent: 30 minutes  Miki Kins, FNP  02/01/2023   This document may have been prepared by [facility] Voice Recognition software and as such may include unintentional dictation errors.

## 2023-02-02 LAB — URINALYSIS, ROUTINE W REFLEX MICROSCOPIC
Bilirubin, UA: NEGATIVE
Glucose, UA: NEGATIVE
Leukocytes,UA: NEGATIVE
Nitrite, UA: NEGATIVE
RBC, UA: NEGATIVE
Specific Gravity, UA: 1.025 (ref 1.005–1.030)
Urobilinogen, Ur: 1 mg/dL (ref 0.2–1.0)
pH, UA: 6.5 (ref 5.0–7.5)

## 2023-02-02 LAB — MICROSCOPIC EXAMINATION: Casts: NONE SEEN /lpf

## 2023-02-02 LAB — URINE CULTURE

## 2023-02-09 ENCOUNTER — Encounter: Payer: Self-pay | Admitting: Oncology

## 2023-02-09 LAB — MISC LABCORP TEST (SEND OUT): Labcorp test code: 252823

## 2023-02-13 NOTE — Assessment & Plan Note (Signed)
Patient is seen by hematology, who manage this condition.  She is well controlled with current therapy.   Will defer to them for further changes to plan of care.  

## 2023-02-13 NOTE — Assessment & Plan Note (Signed)
Continue current diabetes POC, as patient has been well controlled on current regimen.  Will adjust meds if needed based on labs.  

## 2023-02-13 NOTE — Assessment & Plan Note (Signed)
Patient stable.  Well controlled with current therapy.   Continue current meds.  

## 2023-02-13 NOTE — Assessment & Plan Note (Signed)
Patient is seen by hematology, who manage this condition.  She is well controlled with current therapy.   Will defer to them for further changes to plan of care.

## 2023-02-13 NOTE — Assessment & Plan Note (Signed)
Continue current meds.  Will adjust as needed based on results.  The patient is asked to make an attempt to improve diet and exercise patterns to aid in medical management of this problem. Addressed importance of increasing and maintaining water intake.   

## 2023-02-15 ENCOUNTER — Telehealth: Payer: Self-pay

## 2023-02-15 LAB — MISC LABCORP TEST (SEND OUT)

## 2023-02-15 NOTE — Telephone Encounter (Signed)
-----   Message from Rickard Patience, MD sent at 02/12/2023  8:34 PM EDT ----- Please help to find the faxed result.

## 2023-02-15 NOTE — Telephone Encounter (Signed)
Per [facility] (lab), [facility] had to send test to a different lab, but the will refax results.

## 2023-02-16 ENCOUNTER — Encounter: Payer: Self-pay | Admitting: Family

## 2023-02-16 NOTE — Telephone Encounter (Signed)
Result scanned in media and copy given to provider

## 2023-03-02 ENCOUNTER — Other Ambulatory Visit: Payer: Self-pay | Admitting: Family

## 2023-03-02 DIAGNOSIS — E1165 Type 2 diabetes mellitus with hyperglycemia: Secondary | ICD-10-CM

## 2023-03-03 ENCOUNTER — Telehealth: Payer: Self-pay

## 2023-03-04 NOTE — Telephone Encounter (Signed)
PA approved, pt notified by staff.

## 2023-03-17 ENCOUNTER — Telehealth: Payer: Self-pay | Admitting: Neurology

## 2023-03-17 DIAGNOSIS — G40001 Localization-related (focal) (partial) idiopathic epilepsy and epileptic syndromes with seizures of localized onset, not intractable, with status epilepticus: Secondary | ICD-10-CM

## 2023-03-17 NOTE — Telephone Encounter (Signed)
Pt reports she is running low on her Depakote ER 250 mg 100 ct tablets .  The message from 09-09-22 was read to pt.  She said she has spoken with [facility] , they are telling her that they do not have an accurate prescription from Dr Lucia Gaskins, the script was just for 1.Pt is asking that a proper Rx be sent to [facility] so she is able to get her medication from no until her date of September 03, 2023 Phone [phone]. Fax [phone] For [facility]

## 2023-03-17 NOTE — Telephone Encounter (Signed)
Pt will need an appt here w/ either [name] or Dr Lucia Gaskins asap. She was last seen 12/2021. I called [facility]. They could not take a verbal order. Pharmacy number is [phone].

## 2023-03-17 NOTE — Telephone Encounter (Addendum)
I called [facility] pharmacy at [phone] and spoke with [facility]. I gave v.o. under Dr Lucia Gaskins for Depakote ER 250 mg (take 4 per day), 30 day supply. They will fill as soon as eligible. She stated the patient last filled this in April 2024.

## 2023-03-18 ENCOUNTER — Encounter: Payer: Self-pay | Admitting: Family

## 2023-03-18 NOTE — Telephone Encounter (Signed)
I spoke with the patient.  I advised that I sent in a 30-day supply of Depakote to [facility] pharmacy yesterday evening.  I discussed with the patient that her last visit was May 2023 and we need a follow-up in order to continue prescribing medication.  The patient did ask for Dr. Lucia Gaskins but her next opening is not until December.  I scheduled the patient for a video visit with [name] NP, whom she has seen before, for next Thursday, August 8 at 2:00 PM.  The patient is aware that we may need to order labs.  She would like to go to her local [facility].  I advised that I will send a message to [name] NP.  If labs need to be ordered prior, I will give her a call early next week so she can go ahead and get them drawn before her visit.  Otherwise we may need to see her first and then order the necessary labs.  Patient questions were answered and she verbalized appreciation for the call.  She will likely want to see Dr. Lucia Gaskins for her next visit.

## 2023-03-21 ENCOUNTER — Other Ambulatory Visit: Payer: Self-pay

## 2023-03-21 DIAGNOSIS — G40001 Localization-related (focal) (partial) idiopathic epilepsy and epileptic syndromes with seizures of localized onset, not intractable, with status epilepticus: Secondary | ICD-10-CM

## 2023-03-21 NOTE — Telephone Encounter (Signed)
Prescription faxed to [facility] [phone]. Marked as urgent. Received a receipt of confirmation.

## 2023-03-21 NOTE — Telephone Encounter (Addendum)
Spoke with Abigail Hall in our lab. The valproic acid level order has been released so patient can have this drawn at her local [facility]. I called the patient and LVM (ok per DPR) explaining that [name] has ordered a trough level to be drawn just before her dose of Depakote is taken for the day. I asked the patient to call me back so I can explain and make sure she understands. I left our office number in the message.

## 2023-03-21 NOTE — Telephone Encounter (Addendum)
I tried to call [facility] back. I was on the phone for 45 minutes. I spoke with Charlene at [facility]. She ended up telling me that the verbal prescription that I called in last week couldn't be processed because they only give out 90 days supplies. She told me one would have to be restarted and they also need doctor's NPI. They are faxing the prescription to our office (pod 4) asap. Pt's application effective through 09/03/23. Her patient ID is [id number].  I was able to locate a prescription form on the [facility] website. This has been completed and is pending Dr Trevor Mace signature.

## 2023-03-21 NOTE — Telephone Encounter (Signed)
OK. TY. I placed labs. Please let her know we need a trough level preferably before visit. Also, do we have to release lab for her local [facility]? TY!

## 2023-03-21 NOTE — Telephone Encounter (Signed)
Pt has called with Robin from [facility] pharmacy , she states [place], [place] sent over Rx without the quantity for the DEPAKOTE ER 250 MG 24 hr tablet, she is also asking that along with the quantity , Dr Trevor Mace NPI needs to be faxed to them at [phone] if there is need of a call back their # is [phone] option 3

## 2023-03-21 NOTE — Telephone Encounter (Signed)
  Checked for PA to be sure. Prescription application for [name] asks if this was denied by insurance.

## 2023-03-21 NOTE — Addendum Note (Signed)
Addended by: Shawnie Dapper L on: 03/21/2023 09:42 AM   Modules accepted: Orders
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
## 2023-03-24 ENCOUNTER — Encounter: Payer: Self-pay | Admitting: Family Medicine

## 2023-03-24 ENCOUNTER — Telehealth: Payer: Medicaid Other | Admitting: Family Medicine

## 2023-03-24 DIAGNOSIS — G40001 Localization-related (focal) (partial) idiopathic epilepsy and epileptic syndromes with seizures of localized onset, not intractable, with status epilepticus: Secondary | ICD-10-CM

## 2023-03-28 ENCOUNTER — Encounter: Payer: Self-pay | Admitting: *Deleted

## 2023-03-29 NOTE — Patient Instructions (Signed)
Below is our plan:  We will continue Depakote ER 1000mg  daily (two tablets at same time every morning). Please do not skip any doses of seizure medication. I will send orders for new labs to be done in 4-6 weeks. Please go back your local [facility] for these labs and they will send results to me.   Please make sure you are consistent with timing of seizure medication. I recommend annual visit with primary care provider (PCP) for complete physical and routine blood work. I recommend daily intake of vitamin D (400-800iu) and calcium (800-1000mg ) for bone health. Discuss Dexa screening with PCP.   According to [place] law, you can not drive unless you are seizure / syncope free for at least 6 months and under physician's care.  Please maintain precautions. Do not participate in activities where a loss of awareness could harm you or someone else. No swimming alone, no tub bathing, no hot tubs, no driving, no operating motorized vehicles (cars, ATVs, motocycles, etc), lawnmowers, power tools or firearms. No standing at heights, such as rooftops, ladders or stairs. Avoid hot objects such as stoves, heaters, open fires. Wear a helmet when riding a bicycle, scooter, skateboard, etc. and avoid areas of traffic. Set your water heater to 120 degrees or less.  SUDEP is the sudden, unexpected death of someone with epilepsy, who was otherwise healthy. In SUDEP cases, no other cause of death is found when an autopsy is done. Each year, more than 1 in 1,000 people with epilepsy die from SUDEP. This is the leading cause of death in people with uncontrolled seizures. Until further answers are available, the best way to prevent SUDEP is to lower your risk by controlling seizures. Research has found that people with all types of epilepsy that experience convulsive seizures can be at risk.  Please make sure you are staying well hydrated. I recommend 50-60 ounces daily. Well balanced diet and regular exercise encouraged. Consistent  sleep schedule with 6-8 hours recommended.   Please continue follow up with care team as directed.   Follow up with me in 1 year  You may receive a survey regarding today's visit. I encourage you to leave honest feed back as I do use this information to improve patient care. Thank you for seeing me today!
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
## 2023-04-04 ENCOUNTER — Ambulatory Visit: Payer: Medicaid Other | Admitting: Family

## 2023-04-04 VITALS — BP 120/69 | HR 84 | Ht 64.0 in | Wt 203.4 lb

## 2023-04-04 DIAGNOSIS — R5383 Other fatigue: Secondary | ICD-10-CM

## 2023-04-04 DIAGNOSIS — E559 Vitamin D deficiency, unspecified: Secondary | ICD-10-CM

## 2023-04-04 DIAGNOSIS — F900 Attention-deficit hyperactivity disorder, predominantly inattentive type: Secondary | ICD-10-CM

## 2023-04-04 DIAGNOSIS — I10 Essential (primary) hypertension: Secondary | ICD-10-CM | POA: Diagnosis not present

## 2023-04-04 DIAGNOSIS — E782 Mixed hyperlipidemia: Secondary | ICD-10-CM

## 2023-04-04 DIAGNOSIS — E538 Deficiency of other specified B group vitamins: Secondary | ICD-10-CM

## 2023-04-04 DIAGNOSIS — E1165 Type 2 diabetes mellitus with hyperglycemia: Secondary | ICD-10-CM

## 2023-04-04 MED ORDER — MUPIROCIN 2 % EX OINT: 1.0000 | TOPICAL_OINTMENT | Freq: Two times a day (BID) | CUTANEOUS | 0 refills | Status: AC

## 2023-04-04 NOTE — Progress Notes (Signed)
Established Patient Office Visit  Subjective:  Patient ID: Abigail Hall, female    DOB: [birth year]  Age: 42 y.o. MRN: [id number]  Chief Complaint  Patient presents with   Follow-up    2 mo f/u    Patient is here today for her 2 months follow up.  She has been feeling well since last appointment.   She does not have additional concerns to discuss today.  Labs are due today. She needs refills.   I have reviewed her active problem list, medication list, allergies, notes from last encounter, lab results for her appointment today.    No other concerns at this time.   Past Medical History:  Diagnosis Date   Anemia    Anemia, iron deficiency 04/10/2012   Constipation    Depression 04/26/13   history - no current problems   Generalized headaches    Hemorrhoids    History of blood transfusion 02/2013   [facility]  2 units transfused    Nausea & vomiting    resolved after delivery - C/S, extreme   Rectal bleeding    Rectal pain    Seasonal allergies    Seizure (HCC)    Seizures (HCC)    last seizure was  08/2012   Thalassemia    Weakness    resolved after C/S delivery    Past Surgical History:  Procedure Laterality Date   ABDOMINAL HYSTERECTOMY     CARPAL TUNNEL RELEASE  01/2009   right   CESAREAN SECTION  12/31/2011   Procedure: CESAREAN SECTION;  Surgeon: Delbert Harness, MD;  Location: [facility];  Service: Gynecology;  Laterality: N/A;   DILATION AND CURETTAGE OF UTERUS     endometrial ablation   EVALUATION UNDER ANESTHESIA WITH HEMORRHOIDECTOMY N/A 05/31/2013   Procedure: EXAM UNDER ANESTHESIA WITH EXTERNAL HEMORRHOIDECTOMY;  Surgeon: Ardeth Sportsman, MD;  Location: [facility];  Service: General;  Laterality: N/A;   LABIOPLASTY  03/27/2012   Procedure: LABIAPLASTY;  Surgeon: Delbert Harness, MD;  Location: [facility];  Service: Gynecology;  Laterality: N/A;  labia   LAPAROSCOPIC APPENDECTOMY N/A 06/05/2020   Procedure: APPENDECTOMY LAPAROSCOPIC;  Surgeon: Kinsinger, De Blanch, MD;  Location: [facility];  Service: General;  Laterality: N/A;   LAPAROSCOPIC TUBAL LIGATION  03/27/2012   Procedure: LAPAROSCOPIC TUBAL LIGATION;  Surgeon: Delbert Harness, MD;  Location: [facility];  Service: Gynecology;  Laterality: Bilateral;  fallopian tubes caurtery   LAPAROSCOPY     removal of cyst   ROBOTIC ASSISTED TOTAL HYSTERECTOMY N/A 05/02/2013   Procedure: ROBOTIC ASSISTED TOTAL HYSTERECTOMY;  Surgeon: [name], MD;  Location: [facility];  Service: Gynecology;  Laterality: N/A;   TONGUE SURGERY     TRANSANAL HEMORRHOIDAL DEARTERIALIZATION N/A 05/31/2013   Procedure: THD HEMORRHOIDAL LIGATION/PEXY;  Surgeon: Ardeth Sportsman, MD;  Location: [facility];  Service: General;  Laterality: N/A;   TUBAL LIGATION     UNILATERAL SALPINGECTOMY Right 05/02/2013   Procedure: UNILATERAL SALPINGECTOMY;  Surgeon: [name], MD;  Location: [facility];  Service: Gynecology;  Laterality: Right;   WISDOM TOOTH EXTRACTION      Social History   Socioeconomic History   Marital status: Single    Spouse name: Not on file   Number of children: 1   Years of education: Some College   Highest education level: Not on file  Occupational History   Occupation: [employer]  Tobacco Use   Smoking status: Never   Smokeless tobacco: Never  Vaping Use  Vaping status: Never Used  Substance and Sexual Activity   Alcohol use: Yes    Comment: occ   Drug use: No   Sexual activity: Not on file  Other Topics Concern   Not on file  Social History Narrative   ** Merged History Encounter **       Lives at home with her son Caffeine use: none Right-handed   Social Determinants of Corporate investment banker Strain: Not on file  Food Insecurity: Not on file  Transportation Needs: Not on file  Physical Activity: Not on file  Stress: Not on file  Social Connections: Not on file  Intimate Partner Violence: Not on file    Family History  Problem Relation Age of Onset   Diabetes Mother     Hyperlipidemia Father    Cancer Maternal Grandfather    Hyperlipidemia Paternal Grandfather    Diabetes Maternal Grandmother    Anesthesia problems Neg Hx    Hypotension Neg Hx    Malignant hyperthermia Neg Hx    Pseudochol deficiency Neg Hx     Allergies  Allergen Reactions   Cephalexin Hives   Latex Itching, Swelling and Rash   Keflex [Cephalexin] Hives   Latex Itching    Review of Systems  All other systems reviewed and are negative.      Objective:   BP 120/69   Pulse 84   Ht 5\' 4"  (1.626 m)   Wt 203 lb 6.4 oz (92.3 kg)   LMP 04/25/2013   SpO2 98%   BMI 34.91 kg/m   Vitals:   04/04/23 1307  BP: 120/69  Pulse: 84  Height: 5\' 4"  (1.626 m)  Weight: 203 lb 6.4 oz (92.3 kg)  SpO2: 98%  BMI (Calculated): 34.9    Physical Exam Vitals and nursing note reviewed.  Constitutional:      Appearance: Normal appearance. She is normal weight.  HENT:     Head: Normocephalic.  Eyes:     Extraocular Movements: Extraocular movements intact.     Conjunctiva/sclera: Conjunctivae normal.     Pupils: Pupils are equal, round, and reactive to light.  Cardiovascular:     Rate and Rhythm: Normal rate.  Pulmonary:     Effort: Pulmonary effort is normal.  Musculoskeletal:        General: Normal range of motion.  Neurological:     General: No focal deficit present.     Mental Status: She is alert and oriented to person, place, and time. Mental status is at baseline.  Psychiatric:        Mood and Affect: Mood normal.        Behavior: Behavior normal.        Thought Content: Thought content normal.        Judgment: Judgment normal.      No results found for any visits on 04/04/23.  Recent Results (from the past 2160 hour(s))  Hepatic function panel     Status: None   Collection Time: 01/26/23 11:09 AM  Result Value Ref Range   Total Protein 7.0 6.5 - 8.1 g/dL   Albumin 4.1 3.5 - 5.0 g/dL   AST 17 15 - 41 U/L   ALT 17 0 - 44 U/L   Alkaline Phosphatase 54 38 - 126  U/L   Total Bilirubin 0.8 0.3 - 1.2 mg/dL   Bilirubin, Direct 0.1 0.0 - 0.2 mg/dL   Indirect Bilirubin 0.7 0.3 - 0.9 mg/dL    Comment: Performed at [facility]  Lab, [address] [place], [place] [address]  Haptoglobin     Status: None   Collection Time: 01/26/23 11:09 AM  Result Value Ref Range   Haptoglobin 130 42 - 296 mg/dL    Comment: (NOTE) Performed At: [facility] [address] [place], [place] [id number] Jolene Schimke MD [id number]   Lactate dehydrogenase     Status: None   Collection Time: 01/26/23 11:09 AM  Result Value Ref Range   LDH 107 98 - 192 U/L    Comment: Performed at [facility], [address] [place], [place] [address]  Miscellaneous [facility] test (send-out)     Status: None   Collection Time: 01/26/23 11:09 AM  Result Value Ref Range   [facility] test code [id number]    [facility] test name BTHALASSEMIA:HBB(FULLGENE)SEQUENCE    Misc [facility] result COMMENT     Comment: See Scanned report in [place] [facility] (NOTE) Test Ordered: [id number] Beta Thalassemia: HBB Routing                        Comment                   TG   The test has been completed and the results have been faxed to you. Performed At: [facility] [address] [place], [place] [id number] Jolene Schimke MD [id number] Performed At: [facility] [address] [place], [place] [id number] Maurine Simmering MDPhD [id number] Performed at [facility], [address] [place], [place] [address]   POCT CBG (Fasting - Glucose)     Status: Normal   Collection Time: 02/01/23 10:40 AM  Result Value Ref Range   Glucose Fasting, POC 97 70 - 99 mg/dL  POC CREATINE & ALBUMIN,URINE     Status: None   Collection Time: 02/01/23 11:34 AM  Result Value Ref Range   Microalbumin Ur, POC 150 mg/L   Creatinine, POC 300 mg/dL   Albumin/Creatinine Ratio, Urine, POC 30-300   Urinalysis, Routine w reflex microscopic     Status: Abnormal   Collection Time: 02/01/23   4:26 PM  Result Value Ref Range   Specific Gravity, UA 1.025 1.005 - 1.030   pH, UA 6.5 5.0 - 7.5   Color, UA Yellow Yellow   Appearance Ur Clear Clear   Leukocytes,UA Negative Negative   Protein,UA 1+ (A) Negative/Trace   Glucose, UA Negative Negative   Ketones, UA Trace (A) Negative   RBC, UA Negative Negative   Bilirubin, UA Negative Negative   Urobilinogen, Ur 1.0 0.2 - 1.0 mg/dL   Nitrite, UA Negative Negative   Microscopic Examination See below:     Comment: Microscopic was indicated and was performed.  Microscopic Examination     Status: None   Collection Time: 02/01/23  4:26 PM  Result Value Ref Range   WBC, UA 0-5 0 - 5 /hpf   RBC, Urine 0-2 0 - 2 /hpf   Epithelial Cells (non renal) 0-10 0 - 10 /hpf   Casts None seen None seen /lpf   Bacteria, UA Few None seen/Few  Urine Culture     Status: None   Collection Time: 02/01/23  4:28 PM   Specimen: Urine, Clean Catch   UC  Result Value Ref Range   Urine Culture, Routine Final report    Organism ID, Bacteria Comment     Comment: Mixed urogenital flora 25,000-50,000 colony forming units per mL   Valproic Acid Level  Status: Abnormal   Collection Time: 03/23/23 11:47 AM  Result Value Ref Range   Valproic Acid Lvl 21 (L) 50 - 100 ug/mL    Comment:                                 Detection Limit = 4                            <4 indicates None Detected Toxicity may occur at levels of 100-500. Measurements of free unbound valproic acid may improve the assess- ment of clinical response.        Assessment & Plan:   Problem List Items Addressed This Visit       Active Problems   Type 2 diabetes mellitus with hyperglycemia, without long-term current use of insulin (HCC) - Primary   Relevant Orders   CMP14+EGFR   TSH   Hemoglobin A1c   CBC with Differential/Platelet   Other Visit Diagnoses     Vitamin D deficiency, unspecified       Relevant Orders   VITAMIN D 25 Hydroxy (Vit-D Deficiency, Fractures)    CMP14+EGFR   TSH   CBC with Differential/Platelet   Other fatigue       Relevant Orders   CMP14+EGFR   TSH   CBC with Differential/Platelet   Mixed hyperlipidemia       Relevant Orders   Lipid panel   CMP14+EGFR   TSH   CBC with Differential/Platelet   Essential hypertension, benign       Relevant Orders   CMP14+EGFR   TSH   CBC with Differential/Platelet   ADHD (attention deficit hyperactivity disorder), inattentive type       Relevant Orders   CMP14+EGFR   TSH   CBC with Differential/Platelet   B12 deficiency due to diet       Relevant Orders   CMP14+EGFR   TSH   Vitamin B12   CBC with Differential/Platelet       Return in about 2 months (around 06/04/2023) for F/U.   Total time spent: 20 minutes  Miki Kins, FNP  04/04/2023   This document may have been prepared by [facility] Voice Recognition software and as such may include unintentional dictation errors.

## 2023-04-05 ENCOUNTER — Encounter: Payer: Self-pay | Admitting: Family Medicine

## 2023-04-05 ENCOUNTER — Encounter: Payer: Self-pay | Admitting: Family

## 2023-04-05 ENCOUNTER — Telehealth: Payer: Medicaid Other | Admitting: Family Medicine

## 2023-04-05 DIAGNOSIS — G40001 Localization-related (focal) (partial) idiopathic epilepsy and epileptic syndromes with seizures of localized onset, not intractable, with status epilepticus: Secondary | ICD-10-CM | POA: Diagnosis not present

## 2023-04-07 ENCOUNTER — Other Ambulatory Visit: Payer: Self-pay

## 2023-04-07 DIAGNOSIS — G40001 Localization-related (focal) (partial) idiopathic epilepsy and epileptic syndromes with seizures of localized onset, not intractable, with status epilepticus: Secondary | ICD-10-CM

## 2023-04-07 DIAGNOSIS — Z79899 Other long term (current) drug therapy: Secondary | ICD-10-CM

## 2023-04-07 MED ORDER — DEPAKOTE ER 500 MG PO TB24: 1000.0000 mg | ORAL_TABLET | Freq: Every day | ORAL | 3 refills | Status: DC

## 2023-04-12 ENCOUNTER — Other Ambulatory Visit: Payer: Self-pay

## 2023-04-12 ENCOUNTER — Telehealth: Payer: Self-pay | Admitting: Family

## 2023-04-12 MED ORDER — ZINC OXIDE 20 % EX OINT: TOPICAL_OINTMENT | Freq: Two times a day (BID) | CUTANEOUS | 3 refills | Status: DC

## 2023-04-12 MED ORDER — AMOXICILLIN 500 MG PO TABS: 500.0000 mg | ORAL_TABLET | Freq: Two times a day (BID) | ORAL | 0 refills | Status: DC

## 2023-04-12 NOTE — Telephone Encounter (Signed)
Misty Stanley from [facility] left VM that they received an Rx for hydrocortisone-nystatin-zinc, this is a compound so they cannot fill it. Could see if [facility] can compound it.

## 2023-04-12 NOTE — Telephone Encounter (Signed)
[facility] called needing the strengths for each medication and how you want that compounded in quantities such as 1:1:1. Please call back at [phone].

## 2023-04-12 NOTE — Telephone Encounter (Signed)
Sent to [facility] 

## 2023-04-14 ENCOUNTER — Telehealth: Payer: Self-pay | Admitting: Family Medicine

## 2023-04-14 ENCOUNTER — Other Ambulatory Visit: Payer: Self-pay

## 2023-04-14 MED ORDER — DIVALPROEX SODIUM ER 250 MG PO TB24: 1000.0000 mg | ORAL_TABLET | Freq: Every day | ORAL | 3 refills | Status: DC

## 2023-04-14 NOTE — Telephone Encounter (Signed)
Working on this

## 2023-04-14 NOTE — Telephone Encounter (Signed)
Pt's last refill for her DEPAKOTE ER 250 MG 24 hr tablet, was on 8-6 it was a 30 day.  Pt will run out on 9-6 she is asking that it be the DEPAKOTE ER 250 MG 24 hr tablet not 500mg (which according to pt has not been made in over a year and a 1/2).  Pt is asking that it be a sufficient amount since she takes 4 a day of the 250 mg for a total of 1000mg 

## 2023-04-15 ENCOUNTER — Other Ambulatory Visit: Payer: Self-pay

## 2023-04-15 MED ORDER — ZINC OXIDE 20 % EX OINT: TOPICAL_OINTMENT | Freq: Two times a day (BID) | CUTANEOUS | 3 refills | Status: AC

## 2023-04-15 NOTE — Telephone Encounter (Signed)
[facility] is calling again about clarification on patients rx's that were sent in. Someone please contact [facility] about this

## 2023-04-18 ENCOUNTER — Encounter: Payer: Self-pay | Admitting: Family

## 2023-04-19 ENCOUNTER — Other Ambulatory Visit: Payer: Self-pay

## 2023-04-19 MED ORDER — DIVALPROEX SODIUM ER 250 MG PO TB24: 1000.0000 mg | ORAL_TABLET | Freq: Every day | ORAL | 3 refills | Status: DC

## 2023-04-19 MED ORDER — DIVALPROEX SODIUM ER 250 MG PO TB24: 1000.0000 mg | ORAL_TABLET | Freq: Every day | ORAL | 1 refills | Status: DC

## 2023-04-19 NOTE — Telephone Encounter (Addendum)
Abigail Hall patient called back stating she only has 3 days worth of mediation left on DEPAKOTE ER 250 MG 24 hr takes 4 tablets daily.  Reports she doesn't take 500 mg tablets (states she never took this dose) pt said My Abigail Hall assist said they never received Rx sent to them. I sent in Rx for 250 mg dose mg Depakote ER for 90 day supply. I called patient because we received a [facility] patient assistance form, pt said she has already done this part and the [facility] just needs Rx.    Just FYI

## 2023-04-19 NOTE — Addendum Note (Signed)
Addended by: Aura Camps on: 04/19/2023 01:44 PM   Modules accepted: Orders

## 2023-04-20 ENCOUNTER — Telehealth: Payer: Self-pay

## 2023-04-20 ENCOUNTER — Other Ambulatory Visit: Payer: Self-pay

## 2023-04-20 MED ORDER — DIVALPROEX SODIUM ER 250 MG PO TB24: 1000.0000 mg | ORAL_TABLET | Freq: Every day | ORAL | 3 refills | Status: DC

## 2023-04-20 NOTE — Telephone Encounter (Signed)
Spoke with pt and [facility] yesterday 04/19/2023 about medication and was informed that Abigail Hall does not have Escript you have to either call the prescription in at [phone] or Fax script to [phone]. Tried calling [phone] and was on the line for and no representative came to the phone. Printed off script and had work in doctor Dr. Teresa Coombs to sign scrip in [name]'s  place due to her being absent and faxed script to [facility] at [phone] on 04/19/2023.

## 2023-04-20 NOTE — Telephone Encounter (Signed)
Called pt and spoke with her and she said that her quantity was wrong. Fixed script so it quantity is 360 with 3 refills. Got script signed and Faxed script to [facility] at [phone] 04/20/2023

## 2023-04-25 ENCOUNTER — Inpatient Hospital Stay: Payer: Medicaid Other | Attending: Oncology

## 2023-04-25 ENCOUNTER — Inpatient Hospital Stay (HOSPITAL_BASED_OUTPATIENT_CLINIC_OR_DEPARTMENT_OTHER): Payer: Medicaid Other | Admitting: Oncology

## 2023-04-25 ENCOUNTER — Encounter: Payer: Self-pay | Admitting: Oncology

## 2023-04-25 VITALS — BP 116/73 | HR 95 | Temp 97.3°F | Resp 18 | Wt 201.9 lb

## 2023-04-25 DIAGNOSIS — Z809 Family history of malignant neoplasm, unspecified: Secondary | ICD-10-CM | POA: Diagnosis not present

## 2023-04-25 DIAGNOSIS — D509 Iron deficiency anemia, unspecified: Secondary | ICD-10-CM

## 2023-04-25 DIAGNOSIS — R7989 Other specified abnormal findings of blood chemistry: Secondary | ICD-10-CM | POA: Insufficient documentation

## 2023-04-25 DIAGNOSIS — D563 Thalassemia minor: Secondary | ICD-10-CM | POA: Diagnosis not present

## 2023-04-25 DIAGNOSIS — D561 Beta thalassemia: Secondary | ICD-10-CM | POA: Diagnosis present

## 2023-04-25 DIAGNOSIS — Z79899 Other long term (current) drug therapy: Secondary | ICD-10-CM | POA: Insufficient documentation

## 2023-04-25 DIAGNOSIS — R569 Unspecified convulsions: Secondary | ICD-10-CM | POA: Diagnosis not present

## 2023-04-25 DIAGNOSIS — R5383 Other fatigue: Secondary | ICD-10-CM | POA: Insufficient documentation

## 2023-04-25 DIAGNOSIS — R718 Other abnormality of red blood cells: Secondary | ICD-10-CM | POA: Insufficient documentation

## 2023-04-25 LAB — CBC WITH DIFFERENTIAL/PLATELET
Abs Immature Granulocytes: 0.02 10*3/uL (ref 0.00–0.07)
Basophils Absolute: 0 10*3/uL (ref 0.0–0.1)
Basophils Relative: 1 %
Eosinophils Absolute: 0.1 10*3/uL (ref 0.0–0.5)
Eosinophils Relative: 2 %
HCT: 31.4 % — ABNORMAL LOW (ref 36.0–46.0)
Hemoglobin: 9.4 g/dL — ABNORMAL LOW (ref 12.0–15.0)
Immature Granulocytes: 0 %
Lymphocytes Relative: 44 %
Lymphs Abs: 2.5 10*3/uL (ref 0.7–4.0)
MCH: 18.1 pg — ABNORMAL LOW (ref 26.0–34.0)
MCHC: 29.9 g/dL — ABNORMAL LOW (ref 30.0–36.0)
MCV: 60.6 fL — ABNORMAL LOW (ref 80.0–100.0)
Monocytes Absolute: 0.4 10*3/uL (ref 0.1–1.0)
Monocytes Relative: 7 %
Neutro Abs: 2.6 10*3/uL (ref 1.7–7.7)
Neutrophils Relative %: 46 %
Platelets: 236 10*3/uL (ref 150–400)
RBC: 5.18 MIL/uL — ABNORMAL HIGH (ref 3.87–5.11)
RDW: 16.5 % — ABNORMAL HIGH (ref 11.5–15.5)
WBC: 5.7 10*3/uL (ref 4.0–10.5)
nRBC: 0 % (ref 0.0–0.2)

## 2023-04-25 LAB — COMPREHENSIVE METABOLIC PANEL
ALT: 12 U/L (ref 0–44)
AST: 13 U/L — ABNORMAL LOW (ref 15–41)
Albumin: 3.6 g/dL (ref 3.5–5.0)
Alkaline Phosphatase: 48 U/L (ref 38–126)
Anion gap: 5 (ref 5–15)
BUN: 14 mg/dL (ref 6–20)
CO2: 23 mmol/L (ref 22–32)
Calcium: 8.8 mg/dL — ABNORMAL LOW (ref 8.9–10.3)
Chloride: 108 mmol/L (ref 98–111)
Creatinine, Ser: 0.67 mg/dL (ref 0.44–1.00)
GFR, Estimated: >60 mL/min (ref >60)
Glucose, Bld: 102 mg/dL — ABNORMAL HIGH (ref 70–99)
Potassium: 4.1 mmol/L (ref 3.5–5.1)
Sodium: 136 mmol/L (ref 135–145)
Total Bilirubin: 0.5 mg/dL (ref 0.3–1.2)
Total Protein: 6.7 g/dL (ref 6.5–8.1)

## 2023-04-25 LAB — IRON AND TIBC
Iron: 58 ug/dL (ref 28–170)
Saturation Ratios: 19 % (ref 10.4–31.8)
TIBC: 300 ug/dL (ref 250–450)
UIBC: 242 ug/dL

## 2023-04-25 LAB — FERRITIN: Ferritin: 1270 ng/mL — ABNORMAL HIGH (ref 11–307)

## 2023-04-25 NOTE — Assessment & Plan Note (Signed)
Etiology unknown.  Could be secondary to previous iron infusions, versus chronic inflammation versus hereditary hemochromatosis, effective erythrocytosis. Hemochromatosis DNA mutation testing is negative.  inflammatory markers-ANA/CRP/ESR were all within normal limits.  No liver cirrhosis on ultrasound abdomen Ferritin level 1200s, continues to improve. She is not interested in iron chelators and since iron panel is gradually improving, I think observation is reasonable.  Recommend to avoid alcohol use, iron supplementation, vitamin C supplementation.

## 2023-04-25 NOTE — Assessment & Plan Note (Addendum)
Recommend family member to be screened for thalassemia This explains her chronic microcytosis and contributes to chronic anemia. 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

## 2023-04-25 NOTE — Progress Notes (Signed)
Hematology/Oncology Progress note [phone] [phone]         Patient Care Team: Miki Kins, FNP as PCP - General (Family Medicine) Maxie Better, MD as PCP - OBGYN (Obstetrics and Gynecology) [name], MD as Consulting Physician (Hematology and Oncology) Jeani Hawking, MD as Consulting Physician (Gastroenterology)   CHIEF COMPLAINTS/REASON FOR VISIT:  Anemia  ASSESSMENT & PLAN:  Microcytic anemia Anemia could be secondary to beta thalassemia. Monitor.    Beta thalassemia minor Recommend family member to be screened for thalassemia This explains her chronic microcytosis and contributes to chronic anemia. DNA sequencing for thalassemia testing showed c.332T>C (p.Leu111Pro), confirming Beta-Showa-Yakushiji thalassemia  Elevated ferritin Etiology unknown.  Could be secondary to previous iron infusions, versus chronic inflammation versus hereditary hemochromatosis, effective erythrocytosis. Hemochromatosis DNA mutation testing is negative.  inflammatory markers-ANA/CRP/ESR were all within normal limits.  No liver cirrhosis on ultrasound abdomen Ferritin level 1200s, continues to improve. She is not interested in iron chelators and since iron panel is gradually improving, I think observation is reasonable.  Recommend to avoid alcohol use, iron supplementation, vitamin C supplementation.  Orders Placed This Encounter  Procedures   Iron and TIBC    Standing Status:   Future    Standing Expiration Date:   04/24/2024   Ferritin    Standing Status:   Future    Standing Expiration Date:   10/23/2023   CBC with Differential/Platelet    Standing Status:   Future    Standing Expiration Date:   04/24/2024   Follow-up in 4 months All questions were answered. The patient knows to call the clinic with any problems, questions or concerns.  Rickard Patience, MD, PhD [facility] Hematology Oncology 04/25/2023     HISTORY OF PRESENTING ILLNESS:   Abigail Hall is a  42 y.o.  female with PMH listed below who was referred to me for anemia Review previous medical records with hematologist Dr. Myna Hidalgo Patient has a longstanding history of chronic anemia, macrocytic.  She has a history of iron deficiency anemia in 2013/2014, urine for pregnancy.  She was treated with IV Feraheme treatments. She continues to receive IV iron treatment despite improved iron saturation and ferritin level, that was considered to be secondary to inflammation. Patient has a history of beta thalassemia minor. Patient wants to transfer her care to our facility. She reports feeling tired. Denies hematochezia, hematuria, hematemesis, epistaxis, black tarry stool or easy bruising.  She has hysterectomy She denies any autoimmune disease.  Patient denies being on any antiplatelet or anticoagulation agents. Patient has history of seizure for which she has been on Depakote.  She follows up with neurology Dr.Ahern.    INTERVAL HISTORY Abigail Hall is a 42 y.o. female who has above history reviewed by me today presents for follow up visit for beta thalassemia minor.  Patient had workup done and presents to discuss results.  MEDICAL HISTORY:  Past Medical History:  Diagnosis Date   Anemia    Anemia, iron deficiency 04/10/2012   Constipation    Depression 04/26/13   history - no current problems   Generalized headaches    Hemorrhoids    History of blood transfusion 02/2013   [facility]  2 units transfused    Nausea & vomiting    resolved after delivery - C/S, extreme   Rectal bleeding    Rectal pain    Seasonal allergies    Seizure (HCC)    Seizures (HCC)    last seizure was  08/2012   Thalassemia    Weakness    resolved after C/S delivery    SURGICAL HISTORY: Past Surgical History:  Procedure Laterality Date   ABDOMINAL HYSTERECTOMY     CARPAL TUNNEL RELEASE  01/2009   right   CESAREAN SECTION  12/31/2011   Procedure: CESAREAN SECTION;   Surgeon: Delbert Harness, MD;  Location: [facility];  Service: Gynecology;  Laterality: N/A;   DILATION AND CURETTAGE OF UTERUS     endometrial ablation   EVALUATION UNDER ANESTHESIA WITH HEMORRHOIDECTOMY N/A 05/31/2013   Procedure: EXAM UNDER ANESTHESIA WITH EXTERNAL HEMORRHOIDECTOMY;  Surgeon: Ardeth Sportsman, MD;  Location: [facility];  Service: General;  Laterality: N/A;   LABIOPLASTY  03/27/2012   Procedure: LABIAPLASTY;  Surgeon: Delbert Harness, MD;  Location: [facility];  Service: Gynecology;  Laterality: N/A;  labia   LAPAROSCOPIC APPENDECTOMY N/A 06/05/2020   Procedure: APPENDECTOMY LAPAROSCOPIC;  Surgeon: Kinsinger, De Blanch, MD;  Location: [facility];  Service: General;  Laterality: N/A;   LAPAROSCOPIC TUBAL LIGATION  03/27/2012   Procedure: LAPAROSCOPIC TUBAL LIGATION;  Surgeon: Delbert Harness, MD;  Location: [facility];  Service: Gynecology;  Laterality: Bilateral;  fallopian tubes caurtery   LAPAROSCOPY     removal of cyst   ROBOTIC ASSISTED TOTAL HYSTERECTOMY N/A 05/02/2013   Procedure: ROBOTIC ASSISTED TOTAL HYSTERECTOMY;  Surgeon: [name], MD;  Location: [facility];  Service: Gynecology;  Laterality: N/A;   TONGUE SURGERY     TRANSANAL HEMORRHOIDAL DEARTERIALIZATION N/A 05/31/2013   Procedure: THD HEMORRHOIDAL LIGATION/PEXY;  Surgeon: Ardeth Sportsman, MD;  Location: [facility];  Service: General;  Laterality: N/A;   TUBAL LIGATION     UNILATERAL SALPINGECTOMY Right 05/02/2013   Procedure: UNILATERAL SALPINGECTOMY;  Surgeon: [name], MD;  Location: [facility];  Service: Gynecology;  Laterality: Right;   WISDOM TOOTH EXTRACTION      SOCIAL HISTORY: Social History   Socioeconomic History   Marital status: Single    Spouse name: Not on file   Number of children: 1   Years of education: Some College   Highest education level: Not on file  Occupational History   Occupation: BOA  Tobacco Use   Smoking status: Never   Smokeless tobacco: Never  Vaping Use   Vaping status: Never  Used  Substance and Sexual Activity   Alcohol use: Yes    Comment: occ   Drug use: No   Sexual activity: Not on file  Other Topics Concern   Not on file  Social History Narrative   ** Merged History Encounter **       Lives at home with her son Caffeine use: none Right-handed   Social Determinants of Corporate investment banker Strain: Not on file  Food Insecurity: Not on file  Transportation Needs: Not on file  Physical Activity: Not on file  Stress: Not on file  Social Connections: Not on file  Intimate Partner Violence: Not on file    FAMILY HISTORY: Family History  Problem Relation Age of Onset   Diabetes Mother    Hyperlipidemia Father    Cancer Maternal Grandfather    Hyperlipidemia Paternal Grandfather    Diabetes Maternal Grandmother    Anesthesia problems Neg Hx    Hypotension Neg Hx    Malignant hyperthermia Neg Hx    Pseudochol deficiency Neg Hx     ALLERGIES:  is allergic to cephalexin, latex, keflex [cephalexin], and latex.  MEDICATIONS:  Current Outpatient Medications  Medication Sig  Dispense Refill   acetaminophen (TYLENOL) 500 MG tablet Take 2 tablets (1,000 mg total) by mouth every 6 (six) hours as needed. 30 tablet 0   amoxicillin (AMOXIL) 500 MG tablet Take 1 tablet (500 mg total) by mouth 2 (two) times daily. 20 tablet 0   Biotin 82956 MCG TBDP Take 10,000 mcg by mouth daily.     divalproex (DEPAKOTE ER) 250 MG 24 hr tablet Take 4 tablets (1,000 mg total) by mouth daily. 360 tablet 3   Fluocinolone Acetonide Scalp 0.01 % OIL Apply 1 application. topically daily as needed. APPLY EXTERNALLY TO THE AFFECTED AREA DAILY AS NEEDED Strength: 0.01% 118.28 mL 11   hydrocortisone 2.5%-nystatin-zinc oxide 20% 1:1:1 ointment mixture Apply topically 2 (two) times daily. To affected areas 240 g 3   ketoconazole (NIZORAL) 2 % shampoo APPLY EXTERNALLY 2 TIMES A WEEK 120 mL 11   lisdexamfetamine (VYVANSE) 30 MG capsule Take 1 capsule (30 mg total) by mouth  daily. 30 capsule 0   mupirocin ointment (BACTROBAN) 2 % Apply 1 Application topically 2 (two) times daily. 88 g 0   OZEMPIC, 0.25 OR 0.5 MG/DOSE, 2 MG/3ML SOPN DIAL AND INJECT UNDER THE SKIN 0.5 MG WEEKLY 3 mL 0   polyethylene glycol powder (GLYCOLAX/MIRALAX) 17 GM/SCOOP powder Take 1 Container by mouth daily as needed for mild constipation. 1 scoop as needed     Semaglutide, 1 MG/DOSE, 4 MG/3ML SOPN Inject 1 mg into the skin once a week. 3 mL 2   tobramycin-dexamethasone (TOBRADEX) ophthalmic solution Place 1 drop into the right eye every 4 (four) hours while awake. 5 mL 0   hydrocortisone (ANUSOL-HC) 25 MG suppository Place 1 suppository (25 mg total) rectally every 12 (twelve) hours. (Patient not taking: Reported on 04/25/2023) 14 suppository 1   No current facility-administered medications for this visit.    Review of Systems  Constitutional:  Positive for fatigue. Negative for appetite change, chills and fever.  HENT:   Negative for hearing loss and voice change.   Eyes:  Negative for eye problems.  Respiratory:  Negative for chest tightness and cough.   Cardiovascular:  Negative for chest pain.  Gastrointestinal:  Negative for abdominal distention, abdominal pain and blood in stool.  Endocrine: Negative for hot flashes.  Genitourinary:  Negative for difficulty urinating and frequency.   Musculoskeletal:  Negative for arthralgias.  Skin:  Negative for itching and rash.  Neurological:  Negative for extremity weakness.  Hematological:  Negative for adenopathy.  Psychiatric/Behavioral:  Negative for confusion.     PHYSICAL EXAMINATION: Vitals:   04/25/23 1024  BP: 116/73  Pulse: 95  Resp: 18  Temp: (!) 97.3 F (36.3 C)   Filed Weights   04/25/23 1024  Weight: 201 lb 14.4 oz (91.6 kg)    Physical Exam Constitutional:      General: She is not in acute distress. HENT:     Head: Normocephalic and atraumatic.  Eyes:     General: No scleral icterus. Cardiovascular:      Rate and Rhythm: Normal rate.  Pulmonary:     Effort: Pulmonary effort is normal. No respiratory distress.  Abdominal:     General: There is no distension.  Musculoskeletal:        General: No deformity. Normal range of motion.     Cervical back: Normal range of motion and neck supple.  Skin:    Findings: No erythema or rash.  Neurological:     Mental Status: She is alert and oriented to  person, place, and time. Mental status is at baseline.     Cranial Nerves: No cranial nerve deficit.     Coordination: Coordination normal.  Psychiatric:        Mood and Affect: Mood normal.      LABORATORY DATA:  I have reviewed the data as listed    Latest Ref Rng & Units 04/25/2023   10:14 AM 01/05/2023   10:15 AM 01/04/2023   10:33 AM  CBC  WBC 4.0 - 10.5 K/uL 5.7  5.2  6.2   Hemoglobin 12.0 - 15.0 g/dL 9.4  9.4  9.4   Hematocrit 36.0 - 46.0 % 31.4  31.2  32.4   Platelets 150 - 400 K/uL 236  264  289       Latest Ref Rng & Units 04/25/2023   10:14 AM 01/26/2023   11:09 AM 12/22/2022    2:24 PM  CMP  Glucose 70 - 99 mg/dL 027   94   BUN 6 - 20 mg/dL 14   11   Creatinine 2.53 - 1.00 mg/dL 6.64   4.03   Sodium 474 - 145 mmol/L 136   137   Potassium 3.5 - 5.1 mmol/L 4.1   4.9   Chloride 98 - 111 mmol/L 108   103   CO2 22 - 32 mmol/L 23   20   Calcium 8.9 - 10.3 mg/dL 8.8   9.5   Total Protein 6.5 - 8.1 g/dL 6.7  7.0  6.7   Total Bilirubin 0.3 - 1.2 mg/dL 0.5  0.8  0.6   Alkaline Phos 38 - 126 U/L 48  54  70   AST 15 - 41 U/L 13  17  11    ALT 0 - 44 U/L 12  17  12     Lab Results  Component Value Date   IRON 58 04/25/2023   TIBC 300 04/25/2023   IRONPCTSAT 19 04/25/2023   FERRITIN 1,270 (H) 04/25/2023     RADIOGRAPHIC STUDIES: I have personally reviewed the radiological images as listed and agreed with the findings in the report. No results found.

## 2023-04-25 NOTE — Assessment & Plan Note (Addendum)
Anemia could be secondary to beta thalassemia. Monitor.

## 2023-04-26 ENCOUNTER — Telehealth: Payer: Self-pay

## 2023-04-26 NOTE — Telephone Encounter (Signed)
Received [employer] PAP for Depakote ER( divalproex 250mg  #360 (take 4 tabs daily) one year supply to be delivered at her home.  Signed by A. Nicholas Lose, NP 04-25-2023, faxed confirmation received 04-26-2023 [phone], ov [phone].

## 2023-04-26 NOTE — Telephone Encounter (Signed)
-----   Message from Rickard Patience sent at 04/25/2023 10:25 PM EDT ----- Please arrange her to follow up in 4 months. Labs prior to MD - labs are ordered.

## 2023-05-18 LAB — CMP14+EGFR
ALT: 11 [IU]/L (ref 0–32)
AST: 13 [IU]/L (ref 0–40)
Albumin: 4 g/dL (ref 3.9–4.9)
Alkaline Phosphatase: 59 [IU]/L (ref 44–121)
BUN/Creatinine Ratio: 17 (ref 9–23)
BUN: 11 mg/dL (ref 6–24)
Bilirubin Total: 0.5 mg/dL (ref 0.0–1.2)
CO2: 24 mmol/L (ref 20–29)
Calcium: 9 mg/dL (ref 8.7–10.2)
Chloride: 106 mmol/L (ref 96–106)
Creatinine, Ser: 0.65 mg/dL (ref 0.57–1.00)
Globulin, Total: 2.5 g/dL (ref 1.5–4.5)
Glucose: 82 mg/dL (ref 70–99)
Potassium: 4.4 mmol/L (ref 3.5–5.2)
Sodium: 140 mmol/L (ref 134–144)
Total Protein: 6.5 g/dL (ref 6.0–8.5)
eGFR: 112 mL/min/{1.73_m2} (ref >59)

## 2023-05-18 LAB — LIPID PANEL
Chol/HDL Ratio: 2.8 {ratio} (ref 0.0–4.4)
Cholesterol, Total: 152 mg/dL (ref 100–199)
HDL: 54 mg/dL (ref >39)
LDL Chol Calc (NIH): 83 mg/dL (ref 0–99)
Triglycerides: 80 mg/dL (ref 0–149)
VLDL Cholesterol Cal: 15 mg/dL (ref 5–40)

## 2023-05-18 LAB — HEMOGLOBIN A1C
Est. average glucose Bld gHb Est-mCnc: 97 mg/dL
Hgb A1c MFr Bld: 5 % (ref 4.8–5.6)

## 2023-05-18 LAB — VITAMIN B12: Vitamin B-12: 787 pg/mL (ref 232–1245)

## 2023-05-18 LAB — VITAMIN D 25 HYDROXY (VIT D DEFICIENCY, FRACTURES): Vit D, 25-Hydroxy: 35.2 ng/mL (ref 30.0–100.0)

## 2023-05-18 LAB — TSH: TSH: 1.85 u[IU]/mL (ref 0.450–4.500)

## 2023-06-28 ENCOUNTER — Other Ambulatory Visit: Payer: Self-pay | Admitting: Family

## 2023-06-28 DIAGNOSIS — E1165 Type 2 diabetes mellitus with hyperglycemia: Secondary | ICD-10-CM

## 2023-07-05 ENCOUNTER — Ambulatory Visit: Payer: Medicaid Other | Admitting: Family

## 2023-07-05 ENCOUNTER — Encounter: Payer: Self-pay | Admitting: Family

## 2023-07-05 VITALS — BP 110/70 | HR 89 | Ht 64.0 in | Wt 189.6 lb

## 2023-07-05 DIAGNOSIS — J069 Acute upper respiratory infection, unspecified: Secondary | ICD-10-CM | POA: Diagnosis not present

## 2023-07-05 DIAGNOSIS — E1165 Type 2 diabetes mellitus with hyperglycemia: Secondary | ICD-10-CM | POA: Diagnosis not present

## 2023-07-05 DIAGNOSIS — Z0130 Encounter for examination of blood pressure without abnormal findings: Secondary | ICD-10-CM

## 2023-07-05 LAB — GLUCOSE, POCT (MANUAL RESULT ENTRY): POC Glucose: 92 mg/dL (ref 70–99)

## 2023-07-05 MED ORDER — AMOXICILLIN-POT CLAVULANATE 875-125 MG PO TABS: 1.0000 | ORAL_TABLET | Freq: Two times a day (BID) | ORAL | 0 refills | Status: DC

## 2023-07-05 MED ORDER — OZEMPIC (2 MG/DOSE) 8 MG/3ML ~~LOC~~ SOPN: 2.0000 mg | PEN_INJECTOR | SUBCUTANEOUS | 3 refills | Status: DC

## 2023-07-05 NOTE — Progress Notes (Signed)
Established Patient Office Visit  Subjective:  Patient ID: Abigail Hall, female    DOB: [birth year]  Age: 42 y.o. MRN: [id number]  Chief Complaint  Patient presents with   Follow-up    3 mo    Week before last - got a call from the school as soon as she dropped her son off. He had a seizure, took him to the ED. She met him there, he got admitted, was there until Wednesday night. Was diagnosed with PNES.   Throat started hurting, then last night she started throwing up.     No other concerns at this time.   Past Medical History:  Diagnosis Date   Anemia    Anemia, iron deficiency 04/10/2012   Appendicitis 06/04/2020   Constipation    Depression 04/26/2013   history - no current problems   Generalized headaches    Hemorrhoids    History of blood transfusion 02/2013   [facility]  2 units transfused    Medication exposure during first trimester of pregnancy 07/15/2011   No issues at this time     Nausea & vomiting    resolved after delivery - C/S, extreme   Normocytic anemia 12/13/2011   Monitoring closely and hem/onc is involved     Rectal bleeding    Rectal pain    Seasonal allergies    Seizure (HCC)    Seizures (HCC)    last seizure was  08/2012   Thalassemia    Weakness    resolved after C/S delivery    Past Surgical History:  Procedure Laterality Date   ABDOMINAL HYSTERECTOMY     CARPAL TUNNEL RELEASE  01/2009   right   CESAREAN SECTION  12/31/2011   Procedure: CESAREAN SECTION;  Surgeon: Delbert Harness, MD;  Location: [facility];  Service: Gynecology;  Laterality: N/A;   DILATION AND CURETTAGE OF UTERUS     endometrial ablation   EVALUATION UNDER ANESTHESIA WITH HEMORRHOIDECTOMY N/A 05/31/2013   Procedure: EXAM UNDER ANESTHESIA WITH EXTERNAL HEMORRHOIDECTOMY;  Surgeon: Ardeth Sportsman, MD;  Location: [facility];  Service: General;  Laterality: N/A;   LABIOPLASTY  03/27/2012   Procedure: LABIAPLASTY;  Surgeon: Delbert Harness, MD;  Location: [facility];  Service:  Gynecology;  Laterality: N/A;  labia   LAPAROSCOPIC APPENDECTOMY N/A 06/05/2020   Procedure: APPENDECTOMY LAPAROSCOPIC;  Surgeon: Kinsinger, De Blanch, MD;  Location: [facility];  Service: General;  Laterality: N/A;   LAPAROSCOPIC TUBAL LIGATION  03/27/2012   Procedure: LAPAROSCOPIC TUBAL LIGATION;  Surgeon: Delbert Harness, MD;  Location: [facility];  Service: Gynecology;  Laterality: Bilateral;  fallopian tubes caurtery   LAPAROSCOPY     removal of cyst   ROBOTIC ASSISTED TOTAL HYSTERECTOMY N/A 05/02/2013   Procedure: ROBOTIC ASSISTED TOTAL HYSTERECTOMY;  Surgeon: [name], MD;  Location: [facility];  Service: Gynecology;  Laterality: N/A;   TONGUE SURGERY     TRANSANAL HEMORRHOIDAL DEARTERIALIZATION N/A 05/31/2013   Procedure: THD HEMORRHOIDAL LIGATION/PEXY;  Surgeon: Ardeth Sportsman, MD;  Location: [facility];  Service: General;  Laterality: N/A;   TUBAL LIGATION     UNILATERAL SALPINGECTOMY Right 05/02/2013   Procedure: UNILATERAL SALPINGECTOMY;  Surgeon: [name], MD;  Location: [facility];  Service: Gynecology;  Laterality: Right;   WISDOM TOOTH EXTRACTION      Social History   Socioeconomic History   Marital status: Single    Spouse name: Not on file   Number of children: 1   Years of education:  Some College   Highest education level: Not on file  Occupational History   Occupation: BOA  Tobacco Use   Smoking status: Never   Smokeless tobacco: Never  Vaping Use   Vaping status: Never Used  Substance and Sexual Activity   Alcohol use: Yes    Comment: occ   Drug use: No   Sexual activity: Not on file  Other Topics Concern   Not on file  Social History Narrative   ** Merged History Encounter **       Lives at home with her son Caffeine use: none Right-handed   Social Drivers of Corporate investment banker Strain: Not on file  Food Insecurity: Not on file  Transportation Needs: Not on file  Physical Activity: Not on file  Stress: Not on file  Social  Connections: Not on file  Intimate Partner Violence: Not on file    Family History  Problem Relation Age of Onset   Diabetes Mother    Hyperlipidemia Father    Cancer Maternal Grandfather    Hyperlipidemia Paternal Grandfather    Diabetes Maternal Grandmother    Anesthesia problems Neg Hx    Hypotension Neg Hx    Malignant hyperthermia Neg Hx    Pseudochol deficiency Neg Hx     Allergies  Allergen Reactions   Cephalexin Hives   Latex Itching, Swelling and Rash   Keflex [Cephalexin] Hives    Review of Systems  All other systems reviewed and are negative.      Objective:   BP 110/70   Pulse 89   Ht 5\' 4"  (1.626 m)   Wt 189 lb 9.6 oz (86 kg)   LMP 04/25/2013   SpO2 98%   BMI 32.54 kg/m   Vitals:   07/05/23 1050  BP: 110/70  Pulse: 89  Height: 5\' 4"  (1.626 m)  Weight: 189 lb 9.6 oz (86 kg)  SpO2: 98%  BMI (Calculated): 32.53    Physical Exam Vitals and nursing note reviewed.  Constitutional:      Appearance: Normal appearance. She is normal weight.  HENT:     Head: Normocephalic.  Eyes:     Extraocular Movements: Extraocular movements intact.     Conjunctiva/sclera: Conjunctivae normal.     Pupils: Pupils are equal, round, and reactive to light.  Cardiovascular:     Rate and Rhythm: Normal rate.  Pulmonary:     Effort: Pulmonary effort is normal.  Neurological:     General: No focal deficit present.     Mental Status: She is alert and oriented to person, place, and time. Mental status is at baseline.  Psychiatric:        Mood and Affect: Mood normal.        Behavior: Behavior normal.        Thought Content: Thought content normal.        Judgment: Judgment normal.      Results for orders placed or performed in visit on 07/05/23  POCT Glucose (CBG)  Result Value Ref Range   POC Glucose 92 70 - 99 mg/dl    Recent Results (from the past 2160 hours)  Lipid panel     Status: None   Collection Time: 05/17/23 11:12 AM  Result Value Ref Range    Cholesterol, Total 152 100 - 199 mg/dL   Triglycerides 80 0 - 149 mg/dL   HDL 54 >10 mg/dL   VLDL Cholesterol Cal 15 5 - 40 mg/dL   LDL Chol Calc (NIH)  83 0 - 99 mg/dL   Chol/HDL Ratio 2.8 0.0 - 4.4 ratio    Comment:                                   T. Chol/HDL Ratio                                             Men  Women                               1/2 Avg.Risk  3.4    3.3                                   Avg.Risk  5.0    4.4                                2X Avg.Risk  9.6    7.1                                3X Avg.Risk 23.4   11.0   VITAMIN D 25 Hydroxy (Vit-D Deficiency, Fractures)     Status: None   Collection Time: 05/17/23 11:12 AM  Result Value Ref Range   Vit D, 25-Hydroxy 35.2 30.0 - 100.0 ng/mL    Comment: Vitamin D deficiency has been defined by the Institute of Medicine and an Endocrine Society practice guideline as a level of serum 25-OH vitamin D less than 20 ng/mL (1,2). The Endocrine Society went on to further define vitamin D insufficiency as a level between 21 and 29 ng/mL (2). 1. IOM (Institute of Medicine). 2010. Dietary reference    intakes for calcium and D. [place]: The    [employer]. 2. Holick MF, Binkley [place], Bischoff-Ferrari HA, et al.    Evaluation, treatment, and prevention of vitamin D    deficiency: an Endocrine Society clinical practice    guideline. JCEM. 2011 Jul; 96(7):1911-30.   CMP14+EGFR     Status: None   Collection Time: 05/17/23 11:12 AM  Result Value Ref Range   Glucose 82 70 - 99 mg/dL   BUN 11 6 - 24 mg/dL   Creatinine, Ser 4.78 0.57 - 1.00 mg/dL   eGFR 295 >62 ZH/YQM/5.78   BUN/Creatinine Ratio 17 9 - 23   Sodium 140 134 - 144 mmol/L   Potassium 4.4 3.5 - 5.2 mmol/L   Chloride 106 96 - 106 mmol/L   CO2 24 20 - 29 mmol/L   Calcium 9.0 8.7 - 10.2 mg/dL   Total Protein 6.5 6.0 - 8.5 g/dL   Albumin 4.0 3.9 - 4.9 g/dL   Globulin, Total 2.5 1.5 - 4.5 g/dL   Bilirubin Total 0.5 0.0 - 1.2 mg/dL   Alkaline  Phosphatase 59 44 - 121 IU/L   AST 13 0 - 40 IU/L   ALT 11 0 - 32 IU/L  TSH     Status: None   Collection Time: 05/17/23 11:12 AM  Result Value Ref Range   TSH 1.850 0.450 - 4.500 uIU/mL  Hemoglobin  A1c     Status: None   Collection Time: 05/17/23 11:12 AM  Result Value Ref Range   Hgb A1c MFr Bld 5.0 4.8 - 5.6 %    Comment:          Prediabetes: 5.7 - 6.4          Diabetes: >6.4          Glycemic control for adults with diabetes: <7.0    Est. average glucose Bld gHb Est-mCnc 97 mg/dL  Vitamin Z61     Status: None   Collection Time: 05/17/23 11:12 AM  Result Value Ref Range   Vitamin B-12 787 232 - 1,245 pg/mL  POCT Glucose (CBG)     Status: Normal   Collection Time: 07/05/23 11:00 AM  Result Value Ref Range   POC Glucose 92 70 - 99 mg/dl  POCT CBG (Fasting - Glucose)     Status: Abnormal   Collection Time: 08/04/23  2:09 PM  Result Value Ref Range   Glucose Fasting, POC 156 (A) 70 - 99 mg/dL       Assessment & Plan:   Problem List Items Addressed This Visit       Endocrine   Type 2 diabetes mellitus with hyperglycemia, without long-term current use of insulin (HCC) - Primary   Checking labs today. Will call pt. With results  Continue current diabetes POC, as patient has been well controlled on current regimen.  Will adjust meds if needed based on labs.        Relevant Medications   Semaglutide, 2 MG/DOSE, (OZEMPIC, 2 MG/DOSE,) 8 MG/3ML SOPN   Other Relevant Orders   POCT Glucose (CBG) (Completed)     Other   Morbid obesity (HCC)   Continue current meds.  Will adjust as needed based on results.  The patient is asked to make an attempt to improve diet and exercise patterns to aid in medical management of this problem. Addressed importance of increasing and maintaining water intake.        Relevant Medications   Semaglutide, 2 MG/DOSE, (OZEMPIC, 2 MG/DOSE,) 8 MG/3ML SOPN   Other Visit Diagnoses       Upper respiratory tract infection, unspecified type        Sending antibiotics for patient.  She will follow up in 1 moth to evaluate to see if she is better.       Return in about 1 month (around 08/04/2023) for F/U.   Total time spent: 20 minutes  Miki Kins, FNP  07/05/2023   This document may have been prepared by [facility] Voice Recognition software and as such may include unintentional dictation errors.

## 2023-08-04 ENCOUNTER — Encounter: Payer: Self-pay | Admitting: Family

## 2023-08-04 ENCOUNTER — Ambulatory Visit: Payer: Medicaid Other | Admitting: Family

## 2023-08-04 VITALS — BP 121/82 | HR 94 | Ht 64.0 in | Wt 191.4 lb

## 2023-08-04 DIAGNOSIS — E1165 Type 2 diabetes mellitus with hyperglycemia: Secondary | ICD-10-CM

## 2023-08-04 DIAGNOSIS — J069 Acute upper respiratory infection, unspecified: Secondary | ICD-10-CM

## 2023-08-04 DIAGNOSIS — Z0130 Encounter for examination of blood pressure without abnormal findings: Secondary | ICD-10-CM

## 2023-08-04 LAB — POCT CBG (FASTING - GLUCOSE)-MANUAL ENTRY: Glucose Fasting, POC: 156 mg/dL — AB (ref 70–99)

## 2023-08-04 NOTE — Progress Notes (Signed)
 Established Patient Office Visit  Subjective:  Patient ID: Abigail Hall, female    DOB: Mar 08, 1981  Age: 42 y.o. MRN: [id number]  Chief Complaint  Patient presents with   Follow-up    Patient is here today for her 1 month follow up.  She has been feeling well since last appointment.  The concerns she was having at her previous appointment have resolved.  She does have additional concerns to discuss today.  Labs are not due today. She needs refills.   I have reviewed her active problem list, medication list, allergies, notes from last encounter, lab results for her appointment today.      No other concerns at this time.   Past Medical History:  Diagnosis Date   Anemia    Anemia, iron deficiency 04/10/2012   Appendicitis 06/04/2020   Constipation    Depression 04/26/2013   history - no current problems   Generalized headaches    Hemorrhoids    History of blood transfusion 02/2013   [facility]  2 units transfused    Medication exposure during first trimester of pregnancy 07/15/2011   No issues at this time     Nausea & vomiting    resolved after delivery - C/S, extreme   Normocytic anemia 12/13/2011   Monitoring closely and hem/onc is involved     Rectal bleeding    Rectal pain    Seasonal allergies    Seizure (HCC)    Seizures (HCC)    last seizure was  08/2012   Thalassemia    Weakness    resolved after C/S delivery    Past Surgical History:  Procedure Laterality Date   ABDOMINAL HYSTERECTOMY     CARPAL TUNNEL RELEASE  01/2009   right   CESAREAN SECTION  12/31/2011   Procedure: CESAREAN SECTION;  Surgeon: Delbert Harness, MD;  Location: [facility];  Service: Gynecology;  Laterality: N/A;   DILATION AND CURETTAGE OF UTERUS     endometrial ablation   EVALUATION UNDER ANESTHESIA WITH HEMORRHOIDECTOMY N/A 05/31/2013   Procedure: EXAM UNDER ANESTHESIA WITH EXTERNAL HEMORRHOIDECTOMY;  Surgeon: Ardeth Sportsman, MD;  Location: [facility];  Service: General;   Laterality: N/A;   LABIOPLASTY  03/27/2012   Procedure: LABIAPLASTY;  Surgeon: Delbert Harness, MD;  Location: [facility];  Service: Gynecology;  Laterality: N/A;  labia   LAPAROSCOPIC APPENDECTOMY N/A 06/05/2020   Procedure: APPENDECTOMY LAPAROSCOPIC;  Surgeon: Kinsinger, De Blanch, MD;  Location: [facility];  Service: General;  Laterality: N/A;   LAPAROSCOPIC TUBAL LIGATION  03/27/2012   Procedure: LAPAROSCOPIC TUBAL LIGATION;  Surgeon: Delbert Harness, MD;  [facility];  Service: Gynecology;  Laterality: Bilateral;  fallopian tubes caurtery   LAPAROSCOPY     removal of cyst   ROBOTIC ASSISTED TOTAL HYSTERECTOMY N/A 05/02/2013   Procedure: ROBOTIC ASSISTED TOTAL HYSTERECTOMY;  Surgeon: [name], MD;  [facility];  Service: Gynecology;  Laterality: N/A;   TONGUE SURGERY     TRANSANAL HEMORRHOIDAL DEARTERIALIZATION N/A 05/31/2013   Procedure: THD HEMORRHOIDAL LIGATION/PEXY;  Surgeon: Ardeth Sportsman, MD;  Location: [facility];  Service: General;  Laterality: N/A;   TUBAL LIGATION     UNILATERAL SALPINGECTOMY Right 05/02/2013   Procedure: UNILATERAL SALPINGECTOMY;  Surgeon: [name], MD;  [facility];  Service: Gynecology;  Laterality: Right;   WISDOM TOOTH EXTRACTION      Social History   Socioeconomic History   Marital status: Single    Spouse name: Not on  file   Number of children: 1   Years of education: Some College   Highest education level: Not on file  Occupational History   Occupation: BOA  Tobacco Use   Smoking status: Never   Smokeless tobacco: Never  Vaping Use   Vaping status: Never Used  Substance and Sexual Activity   Alcohol use: Yes    Comment: occ   Drug use: No   Sexual activity: Not on file  Other Topics Concern   Not on file  Social History Narrative   ** Merged History Encounter **       Lives at home with her son Caffeine use: none Right-handed   Social Drivers of Corporate investment banker Strain: Not on file  Food  Insecurity: Not on file  Transportation Needs: Not on file  Physical Activity: Not on file  Stress: Not on file  Social Connections: Not on file  Intimate Partner Violence: Not on file    Family History  Problem Relation Age of Onset   Diabetes Mother    Hyperlipidemia Father    Cancer Maternal Grandfather    Hyperlipidemia Paternal Grandfather    Diabetes Maternal Grandmother    Anesthesia problems Neg Hx    Hypotension Neg Hx    Malignant hyperthermia Neg Hx    Pseudochol deficiency Neg Hx     Allergies  Allergen Reactions   Cephalexin Hives   Latex Itching, Swelling and Rash   Keflex [Cephalexin] Hives    Review of Systems  All other systems reviewed and are negative.      Objective:   BP 121/82   Pulse 94   Ht 5\' 4"  (1.626 m)   Wt 191 lb 6.4 oz (86.8 kg)   LMP 04/25/2013   SpO2 99%   BMI 32.85 kg/m   Vitals:   08/04/23 1405  BP: 121/82  Pulse: 94  Height: 5\' 4"  (1.626 m)  Weight: 191 lb 6.4 oz (86.8 kg)  SpO2: 99%  BMI (Calculated): 32.84    Physical Exam Vitals and nursing note reviewed.  Constitutional:      Appearance: Normal appearance. She is normal weight.  HENT:     Head: Normocephalic.  Eyes:     Extraocular Movements: Extraocular movements intact.     Conjunctiva/sclera: Conjunctivae normal.     Pupils: Pupils are equal, round, and reactive to light.  Cardiovascular:     Rate and Rhythm: Normal rate.  Pulmonary:     Effort: Pulmonary effort is normal.  Neurological:     General: No focal deficit present.     Mental Status: She is alert and oriented to person, place, and time. Mental status is at baseline.  Psychiatric:        Mood and Affect: Mood normal.        Behavior: Behavior normal.        Thought Content: Thought content normal.        Judgment: Judgment normal.      Results for orders placed or performed in visit on 08/04/23  POCT CBG (Fasting - Glucose)  Result Value Ref Range   Glucose Fasting, POC 156 (A) 70 -  99 mg/dL    Recent Results (from the past 2160 hours)  CBC with Differential/Platelet     Status: Abnormal   Collection Time: 08/31/23 11:02 AM  Result Value Ref Range   WBC 6.6 4.0 - 10.5 K/uL   RBC 5.29 (H) 3.87 - 5.11 MIL/uL   Hemoglobin 9.5 (L)  12.0 - 15.0 g/dL    Comment: Reticulocyte Hemoglobin testing may be clinically indicated, consider ordering this additional test [id number]    HCT 31.2 (L) 36.0 - 46.0 %   MCV 59.0 (L) 80.0 - 100.0 fL   MCH 18.0 (L) 26.0 - 34.0 pg   MCHC 30.4 30.0 - 36.0 g/dL   RDW 54.0 (H) 98.1 - 19.1 %   Platelets 271 150 - 400 K/uL   nRBC 0.0 0.0 - 0.2 %   Neutrophils Relative % 58 %   Neutro Abs 3.9 1.7 - 7.7 K/uL   Lymphocytes Relative 32 %   Lymphs Abs 2.1 0.7 - 4.0 K/uL   Monocytes Relative 6 %   Monocytes Absolute 0.4 0.1 - 1.0 K/uL   Eosinophils Relative 3 %   Eosinophils Absolute 0.2 0.0 - 0.5 K/uL   Basophils Relative 1 %   Basophils Absolute 0.0 0.0 - 0.1 K/uL   Immature Granulocytes 0 %   Abs Immature Granulocytes 0.02 0.00 - 0.07 K/uL    Comment: Performed at [facility], [address] [place], [place] [address]  Ferritin     Status: Abnormal   Collection Time: 08/31/23 11:02 AM  Result Value Ref Range   Ferritin 1,204 (H) 11 - 307 ng/mL    Comment: Performed at [facility], [address] [place], [place] [address]  Iron and TIBC     Status: None   Collection Time: 08/31/23 11:02 AM  Result Value Ref Range   Iron 58 28 - 170 ug/dL   TIBC 086 578 - 469 ug/dL   Saturation Ratios 20 10.4 - 31.8 %   UIBC 230 ug/dL    Comment: Performed at [facility], [address] [place], [place] [address]  Hepatic function panel     Status: Abnormal   Collection Time: 08/31/23 11:02 AM  Result Value Ref Range   Total Protein 6.5 6.5 - 8.1 g/dL   Albumin 3.6 3.5 - 5.0 g/dL   AST 13 (L) 15 - 41 U/L   ALT 11 0 - 44 U/L   Alkaline Phosphatase 50 38 - 126 U/L   Total Bilirubin 0.8 0.0 - 1.2 mg/dL    Bilirubin, Direct 0.1 0.0 - 0.2 mg/dL   Indirect Bilirubin 0.7 0.3 - 0.9 mg/dL    Comment: Performed at [facility], [address], [place], [place] [address]       Assessment & Plan:   Problem List Items Addressed This Visit       Endocrine   Type 2 diabetes mellitus with hyperglycemia, without long-term current use of insulin (HCC) - Primary   Patient stable.  Well controlled with current therapy.   Continue current meds.        Relevant Orders   POCT CBG (Fasting - Glucose) (Completed)     Other   Morbid obesity (HCC)   Continue current meds.  Will adjust as needed based on results.  The patient is asked to make an attempt to improve diet and exercise patterns to aid in medical management of this problem. Addressed importance of increasing and maintaining water intake.        Other Visit Diagnoses       Upper respiratory tract infection, unspecified type       Symptoms have resolved since previous appointment. Patient will me know if these recur.       Return in about 2 months (around 10/05/2023).   Total time spent: 20 minutes  Miki Kins, FNP  08/04/2023   This document may have been prepared by Dragon Voice Recognition software and as such may include unintentional dictation errors.

## 2023-08-14 ENCOUNTER — Encounter: Payer: Self-pay | Admitting: Family

## 2023-08-14 NOTE — Assessment & Plan Note (Signed)
Checking labs today. Will call pt. With results  Continue current diabetes POC, as patient has been well controlled on current regimen.  Will adjust meds if needed based on labs.  

## 2023-08-14 NOTE — Assessment & Plan Note (Signed)
Continue current meds.  Will adjust as needed based on results.  The patient is asked to make an attempt to improve diet and exercise patterns to aid in medical management of this problem. Addressed importance of increasing and maintaining water intake.   

## 2023-08-15 ENCOUNTER — Encounter: Payer: Self-pay | Admitting: Family

## 2023-08-15 ENCOUNTER — Other Ambulatory Visit: Payer: Self-pay | Admitting: Family

## 2023-08-15 MED ORDER — AMOXICILLIN-POT CLAVULANATE 875-125 MG PO TABS: 1.0000 | ORAL_TABLET | Freq: Two times a day (BID) | ORAL | 0 refills | Status: DC

## 2023-08-18 ENCOUNTER — Other Ambulatory Visit: Payer: Self-pay

## 2023-08-18 MED ORDER — FLUTICASONE PROPIONATE 50 MCG/ACT NA SUSP: 1.0000 | Freq: Every day | NASAL | 2 refills | Status: AC

## 2023-08-19 ENCOUNTER — Other Ambulatory Visit: Payer: Self-pay

## 2023-08-19 MED ORDER — AMOXICILLIN-POT CLAVULANATE 875-125 MG PO TABS: 1.0000 | ORAL_TABLET | Freq: Two times a day (BID) | ORAL | 0 refills | Status: DC

## 2023-08-19 MED ORDER — FLUCONAZOLE 150 MG PO TABS: 150.0000 mg | ORAL_TABLET | Freq: Every day | ORAL | 0 refills | Status: AC

## 2023-08-29 ENCOUNTER — Inpatient Hospital Stay: Payer: Medicaid Other | Attending: Oncology

## 2023-08-29 DIAGNOSIS — R569 Unspecified convulsions: Secondary | ICD-10-CM | POA: Insufficient documentation

## 2023-08-29 DIAGNOSIS — D509 Iron deficiency anemia, unspecified: Secondary | ICD-10-CM | POA: Insufficient documentation

## 2023-08-29 DIAGNOSIS — D563 Thalassemia minor: Secondary | ICD-10-CM | POA: Insufficient documentation

## 2023-08-29 DIAGNOSIS — Z79899 Other long term (current) drug therapy: Secondary | ICD-10-CM | POA: Insufficient documentation

## 2023-08-31 ENCOUNTER — Encounter: Payer: Self-pay | Admitting: Oncology

## 2023-08-31 ENCOUNTER — Inpatient Hospital Stay (HOSPITAL_BASED_OUTPATIENT_CLINIC_OR_DEPARTMENT_OTHER): Payer: Medicaid Other | Admitting: Oncology

## 2023-08-31 ENCOUNTER — Inpatient Hospital Stay: Payer: Medicaid Other

## 2023-08-31 ENCOUNTER — Other Ambulatory Visit: Payer: Self-pay

## 2023-08-31 VITALS — BP 125/77 | HR 92 | Temp 97.3°F | Resp 18 | Wt 190.2 lb

## 2023-08-31 DIAGNOSIS — D563 Thalassemia minor: Secondary | ICD-10-CM

## 2023-08-31 DIAGNOSIS — D509 Iron deficiency anemia, unspecified: Secondary | ICD-10-CM

## 2023-08-31 DIAGNOSIS — R7989 Other specified abnormal findings of blood chemistry: Secondary | ICD-10-CM | POA: Diagnosis not present

## 2023-08-31 DIAGNOSIS — Z79899 Other long term (current) drug therapy: Secondary | ICD-10-CM | POA: Diagnosis not present

## 2023-08-31 DIAGNOSIS — R569 Unspecified convulsions: Secondary | ICD-10-CM | POA: Diagnosis not present

## 2023-08-31 LAB — IRON AND TIBC
Iron: 58 ug/dL (ref 28–170)
Saturation Ratios: 20 % (ref 10.4–31.8)
TIBC: 288 ug/dL (ref 250–450)
UIBC: 230 ug/dL

## 2023-08-31 LAB — CBC WITH DIFFERENTIAL/PLATELET
Abs Immature Granulocytes: 0.02 10*3/uL (ref 0.00–0.07)
Basophils Absolute: 0 10*3/uL (ref 0.0–0.1)
Basophils Relative: 1 %
Eosinophils Absolute: 0.2 10*3/uL (ref 0.0–0.5)
Eosinophils Relative: 3 %
HCT: 31.2 % — ABNORMAL LOW (ref 36.0–46.0)
Hemoglobin: 9.5 g/dL — ABNORMAL LOW (ref 12.0–15.0)
Immature Granulocytes: 0 %
Lymphocytes Relative: 32 %
Lymphs Abs: 2.1 10*3/uL (ref 0.7–4.0)
MCH: 18 pg — ABNORMAL LOW (ref 26.0–34.0)
MCHC: 30.4 g/dL (ref 30.0–36.0)
MCV: 59 fL — ABNORMAL LOW (ref 80.0–100.0)
Monocytes Absolute: 0.4 10*3/uL (ref 0.1–1.0)
Monocytes Relative: 6 %
Neutro Abs: 3.9 10*3/uL (ref 1.7–7.7)
Neutrophils Relative %: 58 %
Platelets: 271 10*3/uL (ref 150–400)
RBC: 5.29 MIL/uL — ABNORMAL HIGH (ref 3.87–5.11)
RDW: 16.4 % — ABNORMAL HIGH (ref 11.5–15.5)
WBC: 6.6 10*3/uL (ref 4.0–10.5)
nRBC: 0 % (ref 0.0–0.2)

## 2023-08-31 LAB — HEPATIC FUNCTION PANEL
ALT: 11 U/L (ref 0–44)
AST: 13 U/L — ABNORMAL LOW (ref 15–41)
Albumin: 3.6 g/dL (ref 3.5–5.0)
Alkaline Phosphatase: 50 U/L (ref 38–126)
Bilirubin, Direct: 0.1 mg/dL (ref 0.0–0.2)
Indirect Bilirubin: 0.7 mg/dL (ref 0.3–0.9)
Total Bilirubin: 0.8 mg/dL (ref 0.0–1.2)
Total Protein: 6.5 g/dL (ref 6.5–8.1)

## 2023-08-31 LAB — FERRITIN: Ferritin: 1204 ng/mL — ABNORMAL HIGH (ref 11–307)

## 2023-08-31 NOTE — Assessment & Plan Note (Signed)
Recommend family member to be screened for thalassemia This explains her chronic microcytosis and contributes to chronic anemia. 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

## 2023-08-31 NOTE — Assessment & Plan Note (Signed)
Anemia could be secondary to beta thalassemia. Monitor.

## 2023-08-31 NOTE — Progress Notes (Signed)
 Pt here for follow up. Pt complains of pain to left arm.

## 2023-08-31 NOTE — Assessment & Plan Note (Addendum)
 Etiology unknown.  Could be secondary to previous iron  infusions, versus chronic inflammation versus hereditary hemochromatosis, effective erythrocytosis. Hemochromatosis DNA mutation testing is negative.  inflammatory markers-ANA/CRP/ESR were all within normal limits.  No liver cirrhosis on 01/28/23 ultrasound abdomen Ferritin level 1200s, continues to improve. She is not interested in iron  chelator.  Discussed about observation vs low volume phlebotomy.  Recommend to avoid alcohol use, iron  supplementation, vitamin C supplementation.

## 2023-08-31 NOTE — Progress Notes (Addendum)
 Hematology/Oncology Progress note [phone] [phone]         Patient Care Team: Trenda Frisk, FNP as PCP - General (Family Medicine) Ivery Marking, MD as PCP - OBGYN (Obstetrics and Gynecology) Ivor Mars, MD as Consulting Physician (Hematology and Oncology) Alvis Jourdain, MD as Consulting Physician (Gastroenterology) Timmy Forbes, MD as Consulting Physician (Oncology)   CHIEF COMPLAINTS/REASON FOR VISIT:  Anemia  ASSESSMENT & PLAN:   Microcytic anemia Anemia could be secondary to beta thalassemia. Monitor.    Beta thalassemia minor Recommend family member to be screened for thalassemia This explains her chronic microcytosis and contributes to chronic anemia. DNA sequencing for thalassemia testing showed c.332T>C (p.Leu111Pro), confirming Beta-Showa-Yakushiji thalassemia  Elevated ferritin Etiology unknown.  Could be secondary to previous iron  infusions, versus chronic inflammation versus hereditary hemochromatosis, effective erythrocytosis. Hemochromatosis DNA mutation testing is negative.  inflammatory markers-ANA/CRP/ESR were all within normal limits.  No liver cirrhosis on 01/28/23 ultrasound abdomen Ferritin level 1200s, continues to improve. She is not interested in iron  chelator.  Discussed about observation vs low volume phlebotomy.  Recommend to avoid alcohol use, iron  supplementation, vitamin C supplementation.  Orders Placed This Encounter  Procedures   Hepatic function panel    Standing Status:   Future    Expected Date:   02/28/2024    Expiration Date:   08/30/2024   CBC with Differential (Cancer Center Only)    Standing Status:   Future    Expected Date:   02/28/2024    Expiration Date:   08/30/2024   Iron  and TIBC    Standing Status:   Future    Expected Date:   02/28/2024    Expiration Date:   08/30/2024   Ferritin    Standing Status:   Future    Expected Date:   02/28/2024    Expiration Date:   08/30/2024    Hepatic function panel    Standing Status:   Future    Number of Occurrences:   1    Expected Date:   08/31/2023    Expiration Date:   08/30/2024   Follow-up in 6 months All questions were answered. The patient knows to call the clinic with any problems, questions or concerns.  Timmy Forbes, MD, PhD [facility] Hematology Oncology 08/31/2023     HISTORY OF PRESENTING ILLNESS:  Abigail Hall is a  43 y.o.  female with PMH listed below who was referred to me for anemia Review previous medical records with hematologist Dr. Maria Shiner Patient has a longstanding history of chronic anemia, macrocytic.  She has a history of iron  deficiency anemia in 2013/2014, urine for pregnancy.  She was treated with IV Feraheme  treatments. She continues to receive IV iron  treatment despite improved iron  saturation and ferritin level, that was considered to be secondary to inflammation. Patient has a history of beta thalassemia minor. Patient wants to transfer her care to our facility. She reports feeling tired. Denies hematochezia, hematuria, hematemesis, epistaxis, black tarry stool or easy bruising.  She has hysterectomy She denies any autoimmune disease.  Patient denies being on any antiplatelet or anticoagulation agents. Patient has history of seizure for which she has been on Depakote .  She follows up with neurology Dr.Ahern.    INTERVAL HISTORY Abigail Hall is a 43 y.o. female who has above history reviewed by me today presents for follow up visit for beta thalassemia minor and iron  overload due to previous iron  infusion.   MEDICAL HISTORY:  Past Medical History:  Diagnosis Date  Anemia    Anemia, iron  deficiency 04/10/2012   Appendicitis 06/04/2020   Constipation    Depression 04/26/2013   history - no current problems   Generalized headaches    Hemorrhoids    History of blood transfusion 02/2013   [facility]  2 units transfused    Medication exposure during first trimester of  pregnancy 07/15/2011   No issues at this time     Nausea & vomiting    resolved after delivery - C/S, extreme   Normocytic anemia 12/13/2011   Monitoring closely and hem/onc is involved     Rectal bleeding    Rectal pain    Seasonal allergies    Seizure (HCC)    Seizures (HCC)    last seizure was  08/2012   Thalassemia    Weakness    resolved after C/S delivery    SURGICAL HISTORY: Past Surgical History:  Procedure Laterality Date   ABDOMINAL HYSTERECTOMY     CARPAL TUNNEL RELEASE  01/2009   right   CESAREAN SECTION  12/31/2011   Procedure: CESAREAN SECTION;  Surgeon: Jimmey Mould, MD;  Location: [facility];  Service: Gynecology;  Laterality: N/A;   DILATION AND CURETTAGE OF UTERUS     endometrial ablation   EVALUATION UNDER ANESTHESIA WITH HEMORRHOIDECTOMY N/A 05/31/2013   Procedure: EXAM UNDER ANESTHESIA WITH EXTERNAL HEMORRHOIDECTOMY;  Surgeon: Eddye Goodie, MD;  Location: [facility];  Service: General;  Laterality: N/A;   LABIOPLASTY  03/27/2012   Procedure: LABIAPLASTY;  Surgeon: Jimmey Mould, MD;  Location: [facility];  Service: Gynecology;  Laterality: N/A;  labia   LAPAROSCOPIC APPENDECTOMY N/A 06/05/2020   Procedure: APPENDECTOMY LAPAROSCOPIC;  Surgeon: Kinsinger, Alphonso Aschoff, MD;  Location: [facility];  Service: General;  Laterality: N/A;   LAPAROSCOPIC TUBAL LIGATION  03/27/2012   Procedure: LAPAROSCOPIC TUBAL LIGATION;  Surgeon: Jimmey Mould, MD;  [facility];  Service: Gynecology;  Laterality: Bilateral;  fallopian tubes caurtery   LAPAROSCOPY     removal of cyst   ROBOTIC ASSISTED TOTAL HYSTERECTOMY N/A 05/02/2013   Procedure: ROBOTIC ASSISTED TOTAL HYSTERECTOMY;  Surgeon: Kandra Orn, MD;  [facility];  Service: Gynecology;  Laterality: N/A;   TONGUE SURGERY     TRANSANAL HEMORRHOIDAL DEARTERIALIZATION N/A 05/31/2013   Procedure: THD HEMORRHOIDAL LIGATION/PEXY;  Surgeon: Eddye Goodie, MD;  Location: [facility];  Service: General;  Laterality: N/A;   TUBAL  LIGATION     UNILATERAL SALPINGECTOMY Right 05/02/2013   Procedure: UNILATERAL SALPINGECTOMY;  Surgeon: Kandra Orn, MD;  [facility];  Service: Gynecology;  Laterality: Right;   WISDOM TOOTH EXTRACTION      SOCIAL HISTORY: Social History   Socioeconomic History   Marital status: Single    Spouse name: Not on file   Number of children: 1   Years of education: Some College   Highest education level: Not on file  Occupational History   Occupation: BOA  Tobacco Use   Smoking status: Never   Smokeless tobacco: Never  Vaping Use   Vaping status: Never Used  Substance and Sexual Activity   Alcohol use: Yes    Comment: occ   Drug use: No   Sexual activity: Not on file  Other Topics Concern   Not on file  Social History Narrative   ** Merged History Encounter **       Lives at home with her son Caffeine use: none Right-handed   Social Drivers of Corporate investment banker  Strain: Not on file  Food Insecurity: Not on file  Transportation Needs: Not on file  Physical Activity: Not on file  Stress: Not on file  Social Connections: Not on file  Intimate Partner Violence: Not on file    FAMILY HISTORY: Family History  Problem Relation Age of Onset   Diabetes Mother    Hyperlipidemia Father    Cancer Maternal Grandfather    Hyperlipidemia Paternal Grandfather    Diabetes Maternal Grandmother    Anesthesia problems Neg Hx    Hypotension Neg Hx    Malignant hyperthermia Neg Hx    Pseudochol deficiency Neg Hx     ALLERGIES:  is allergic to cephalexin , latex, and keflex  [cephalexin ].  MEDICATIONS:  Current Outpatient Medications  Medication Sig Dispense Refill   acetaminophen  (TYLENOL ) 500 MG tablet Take 2 tablets (1,000 mg total) by mouth every 6 (six) hours as needed. 30 tablet 0   Biotin 40981 MCG TBDP Take 10,000 mcg by mouth daily.     divalproex  (DEPAKOTE  ER) 250 MG 24 hr tablet Take 4 tablets (1,000 mg total) by mouth daily. 360 tablet 3    Fluocinolone  Acetonide Scalp 0.01 % OIL Apply 1 application. topically daily as needed. APPLY EXTERNALLY TO THE AFFECTED AREA DAILY AS NEEDED Strength: 0.01% 118.28 mL 11   fluticasone  (FLONASE ) 50 MCG/ACT nasal spray Place 1 spray into both nostrils daily. 16 mL 2   hydrocortisone  2.5%-nystatin-zinc  oxide 20% 1:1:1 ointment mixture Apply topically 2 (two) times daily. To affected areas 240 g 3   ketoconazole  (NIZORAL ) 2 % shampoo APPLY EXTERNALLY 2 TIMES A WEEK 120 mL 11   lisdexamfetamine (VYVANSE ) 30 MG capsule Take 1 capsule (30 mg total) by mouth daily. 30 capsule 0   mupirocin  ointment (BACTROBAN ) 2 % Apply 1 Application topically 2 (two) times daily. 88 g 0   Semaglutide , 2 MG/DOSE, (OZEMPIC , 2 MG/DOSE,) 8 MG/3ML SOPN Inject 2 mg into the skin once a week. 3 mL 3   hydrocortisone  (ANUSOL -HC) 25 MG suppository Place 1 suppository (25 mg total) rectally every 12 (twelve) hours. (Patient not taking: Reported on 08/31/2023) 14 suppository 1   OZEMPIC , 1 MG/DOSE, 4 MG/3ML SOPN DIAL AND INJECT UNDER THE SKIN 1 MG WEEKLY (Patient not taking: Reported on 08/31/2023) 3 mL 2   polyethylene glycol powder (GLYCOLAX /MIRALAX ) 17 GM/SCOOP powder Take 1 Container by mouth daily as needed for mild constipation. 1 scoop as needed (Patient not taking: Reported on 08/31/2023)     tobramycin -dexamethasone  (TOBRADEX ) ophthalmic solution Place 1 drop into the right eye every 4 (four) hours while awake. (Patient not taking: Reported on 08/31/2023) 5 mL 0   No current facility-administered medications for this visit.    Review of Systems  Constitutional:  Positive for fatigue. Negative for appetite change, chills and fever.  HENT:   Negative for hearing loss and voice change.   Eyes:  Negative for eye problems.  Respiratory:  Negative for chest tightness and cough.   Cardiovascular:  Negative for chest pain.  Gastrointestinal:  Negative for abdominal distention, abdominal pain and blood in stool.  Endocrine:  Negative for hot flashes.  Genitourinary:  Negative for difficulty urinating and frequency.   Musculoskeletal:  Negative for arthralgias.  Skin:  Negative for itching and rash.  Neurological:  Negative for extremity weakness.  Hematological:  Negative for adenopathy.  Psychiatric/Behavioral:  Negative for confusion.     PHYSICAL EXAMINATION: Vitals:   08/31/23 1120  BP: 125/77  Pulse: 92  Resp: 18  Temp: (!)  97.3 F (36.3 C)   Filed Weights   08/31/23 1120  Weight: 190 lb 3.2 oz (86.3 kg)    Physical Exam Constitutional:      General: She is not in acute distress. HENT:     Head: Normocephalic and atraumatic.  Eyes:     General: No scleral icterus. Cardiovascular:     Rate and Rhythm: Normal rate.  Pulmonary:     Effort: Pulmonary effort is normal. No respiratory distress.  Abdominal:     General: There is no distension.  Musculoskeletal:        General: No deformity. Normal range of motion.     Cervical back: Normal range of motion and neck supple.  Skin:    Findings: No erythema or rash.  Neurological:     Mental Status: She is alert and oriented to person, place, and time. Mental status is at baseline.     Cranial Nerves: No cranial nerve deficit.     Coordination: Coordination normal.  Psychiatric:        Mood and Affect: Mood normal.      LABORATORY DATA:  I have reviewed the data as listed    Latest Ref Rng & Units 08/31/2023   11:02 AM 04/25/2023   10:14 AM 01/05/2023   10:15 AM  CBC  WBC 4.0 - 10.5 K/uL 6.6  5.7  5.2   Hemoglobin 12.0 - 15.0 g/dL 9.5  9.4  9.4   Hematocrit 36.0 - 46.0 % 31.2  31.4  31.2   Platelets 150 - 400 K/uL 271  236  264       Latest Ref Rng & Units 08/31/2023   11:02 AM 05/17/2023   11:12 AM 04/25/2023   10:14 AM  CMP  Glucose 70 - 99 mg/dL  82  657   BUN 6 - 24 mg/dL  11  14   Creatinine 8.46 - 1.00 mg/dL  9.62  9.52   Sodium 841 - 144 mmol/L  140  136   Potassium 3.5 - 5.2 mmol/L  4.4  4.1   Chloride 96 - 106 mmol/L   106  108   CO2 20 - 29 mmol/L  24  23   Calcium  8.7 - 10.2 mg/dL  9.0  8.8   Total Protein 6.5 - 8.1 g/dL 6.5  6.5  6.7   Total Bilirubin 0.0 - 1.2 mg/dL 0.8  0.5  0.5   Alkaline Phos 38 - 126 U/L 50  59  48   AST 15 - 41 U/L 13  13  13    ALT 0 - 44 U/L 11  11  12     Lab Results  Component Value Date   IRON  58 08/31/2023   TIBC 288 08/31/2023   IRONPCTSAT 20 08/31/2023   FERRITIN 1,204 (H) 08/31/2023     RADIOGRAPHIC STUDIES: I have personally reviewed the radiological images as listed and agreed with the findings in the report. No results found.

## 2023-08-31 NOTE — Addendum Note (Signed)
 Addended by: Timmy Forbes on: 08/31/2023 07:39 PM   Modules accepted: Orders

## 2023-09-01 ENCOUNTER — Telehealth: Payer: Self-pay

## 2023-09-01 NOTE — Telephone Encounter (Signed)
Spoke to pt, she is agreeable to phlebotomy.   Please contact pt to schedule phleb x2.

## 2023-09-01 NOTE — Telephone Encounter (Signed)
-----   Message from Rickard Patience sent at 08/31/2023  7:27 PM EST ----- Please patient know that her ferritin level slightly decreased.  I recommend low volume phlebotomy to help further lower iron level. If she agrees, please arrange her to get phlebotomy monthly x 2.  If she prefers observation, keep current appt. Thanks.

## 2023-09-06 ENCOUNTER — Encounter: Payer: Self-pay | Admitting: Family

## 2023-09-15 ENCOUNTER — Inpatient Hospital Stay: Payer: Medicaid Other

## 2023-09-15 VITALS — BP 109/90 | HR 94 | Temp 98.6°F | Resp 17

## 2023-09-15 DIAGNOSIS — D509 Iron deficiency anemia, unspecified: Secondary | ICD-10-CM

## 2023-09-15 MED ORDER — SODIUM CHLORIDE 0.9 % IV SOLN
Freq: Once | INTRAVENOUS | Status: AC
  Filled 2023-09-15: qty 250

## 2023-09-15 NOTE — Patient Instructions (Signed)

## 2023-09-15 NOTE — Progress Notes (Signed)
Patient tolatered phlebotomy well today, performed in RAC using 20g angiocath. 200cc removed as ordered with with 200cc IVF. No concerns voiced. Snack and po fluids offered. Stable at discharge. AVS given.

## 2023-10-05 ENCOUNTER — Ambulatory Visit: Payer: Medicaid Other | Admitting: Family

## 2023-10-05 VITALS — BP 108/68 | HR 78 | Ht 64.0 in | Wt 191.0 lb

## 2023-10-05 DIAGNOSIS — I10 Essential (primary) hypertension: Secondary | ICD-10-CM | POA: Diagnosis not present

## 2023-10-05 DIAGNOSIS — E782 Mixed hyperlipidemia: Secondary | ICD-10-CM

## 2023-10-05 DIAGNOSIS — E1165 Type 2 diabetes mellitus with hyperglycemia: Secondary | ICD-10-CM | POA: Diagnosis not present

## 2023-10-06 ENCOUNTER — Ambulatory Visit: Payer: Medicaid Other | Admitting: Family

## 2023-10-14 ENCOUNTER — Inpatient Hospital Stay: Payer: Medicaid Other | Attending: Oncology

## 2023-10-14 DIAGNOSIS — R7989 Other specified abnormal findings of blood chemistry: Secondary | ICD-10-CM | POA: Diagnosis present

## 2023-10-14 DIAGNOSIS — D509 Iron deficiency anemia, unspecified: Secondary | ICD-10-CM | POA: Diagnosis not present

## 2023-10-14 DIAGNOSIS — D563 Thalassemia minor: Secondary | ICD-10-CM | POA: Diagnosis not present

## 2023-10-14 NOTE — Progress Notes (Signed)
 Patient tolatered phlebotomy well today, performed in LAC using 20g angiocath. 200 cc removed as ordered. No concerns voiced. Snack and po fluids offered. Stable at discharge. Refused AVS

## 2023-10-14 NOTE — Patient Instructions (Signed)

## 2023-10-17 ENCOUNTER — Encounter: Payer: Self-pay | Admitting: Family

## 2023-10-27 ENCOUNTER — Other Ambulatory Visit: Payer: Self-pay | Admitting: Plastic Surgery

## 2023-10-27 DIAGNOSIS — K429 Umbilical hernia without obstruction or gangrene: Secondary | ICD-10-CM

## 2023-10-28 ENCOUNTER — Other Ambulatory Visit: Payer: Self-pay

## 2023-10-28 ENCOUNTER — Ambulatory Visit
Admission: RE | Admit: 2023-10-28 | Discharge: 2023-10-28 | Disposition: A | Discharge status: Home / Self Care | Source: Ambulatory Visit | Attending: Plastic Surgery | Admitting: Plastic Surgery

## 2023-10-28 DIAGNOSIS — K429 Umbilical hernia without obstruction or gangrene: Secondary | ICD-10-CM | POA: Diagnosis present

## 2023-10-28 MED ORDER — LINACLOTIDE 290 MCG PO CAPS: 290.0000 ug | ORAL_CAPSULE | Freq: Every day | ORAL | 3 refills | Status: DC

## 2023-10-28 MED ORDER — LINACLOTIDE 145 MCG PO CAPS
145.0000 ug | ORAL_CAPSULE | Freq: Every day | ORAL | 2 refills | Status: DC
Start: 2023-10-28 — End: 2023-10-28

## 2023-11-03 ENCOUNTER — Encounter: Payer: Self-pay | Admitting: Family

## 2023-11-03 NOTE — Assessment & Plan Note (Signed)
 Patient stable.  Well controlled with current therapy.   Continue current meds.

## 2023-11-03 NOTE — Assessment & Plan Note (Signed)
 Continue current meds.  Will adjust as needed based on results.  The patient is asked to make an attempt to improve diet and exercise patterns to aid in medical management of this problem. Addressed importance of increasing and maintaining water intake.

## 2023-11-10 ENCOUNTER — Other Ambulatory Visit: Payer: Self-pay | Admitting: Family

## 2023-11-16 ENCOUNTER — Ambulatory Visit: Admitting: Family

## 2023-11-16 ENCOUNTER — Encounter: Payer: Self-pay | Admitting: Family

## 2023-11-16 VITALS — BP 122/82 | HR 71 | Ht 64.0 in | Wt 189.4 lb

## 2023-11-16 DIAGNOSIS — T81328A Disruption or dehiscence of closure of other specified internal operation (surgical) wound, initial encounter: Secondary | ICD-10-CM | POA: Diagnosis not present

## 2023-11-16 DIAGNOSIS — D500 Iron deficiency anemia secondary to blood loss (chronic): Secondary | ICD-10-CM

## 2023-11-16 DIAGNOSIS — Z0130 Encounter for examination of blood pressure without abnormal findings: Secondary | ICD-10-CM

## 2023-11-17 LAB — CBC WITH DIFFERENTIAL/PLATELET
Basophils Absolute: 0 10*3/uL (ref 0.0–0.2)
Basos: 1 %
EOS (ABSOLUTE): 0.1 10*3/uL (ref 0.0–0.4)
Eos: 2 %
Hematocrit: 34.6 % (ref 34.0–46.6)
Hemoglobin: 9.7 g/dL — ABNORMAL LOW (ref 11.1–15.9)
Immature Grans (Abs): 0.1 10*3/uL (ref 0.0–0.1)
Immature Granulocytes: 1 %
Lymphocytes Absolute: 2 10*3/uL (ref 0.7–3.1)
Lymphs: 28 %
MCH: 20.8 pg — ABNORMAL LOW (ref 26.6–33.0)
MCHC: 28 g/dL — ABNORMAL LOW (ref 31.5–35.7)
MCV: 74 fL — ABNORMAL LOW (ref 79–97)
Monocytes Absolute: 0.5 10*3/uL (ref 0.1–0.9)
Monocytes: 7 %
Neutrophils Absolute: 4.3 10*3/uL (ref 1.4–7.0)
Neutrophils: 61 %
Platelets: 400 10*3/uL (ref 150–450)
RBC: 4.67 x10E6/uL (ref 3.77–5.28)
RDW: 24.9 % — ABNORMAL HIGH (ref 11.7–15.4)
WBC: 7 10*3/uL (ref 3.4–10.8)

## 2023-12-05 ENCOUNTER — Encounter: Payer: Self-pay | Admitting: Family

## 2023-12-05 ENCOUNTER — Ambulatory Visit: Payer: Medicaid Other | Admitting: Family

## 2023-12-05 VITALS — BP 112/82 | HR 78 | Ht 64.0 in | Wt 184.8 lb

## 2023-12-05 DIAGNOSIS — E559 Vitamin D deficiency, unspecified: Secondary | ICD-10-CM

## 2023-12-05 DIAGNOSIS — E538 Deficiency of other specified B group vitamins: Secondary | ICD-10-CM | POA: Diagnosis not present

## 2023-12-05 DIAGNOSIS — E782 Mixed hyperlipidemia: Secondary | ICD-10-CM

## 2023-12-05 DIAGNOSIS — Z0130 Encounter for examination of blood pressure without abnormal findings: Secondary | ICD-10-CM

## 2023-12-05 DIAGNOSIS — E1165 Type 2 diabetes mellitus with hyperglycemia: Secondary | ICD-10-CM | POA: Diagnosis not present

## 2023-12-05 DIAGNOSIS — D561 Beta thalassemia: Secondary | ICD-10-CM

## 2023-12-05 DIAGNOSIS — R5383 Other fatigue: Secondary | ICD-10-CM

## 2023-12-05 NOTE — Progress Notes (Signed)
 Established Patient Office Visit  Subjective:  Patient ID: Abigail Hall, female    DOB: 1980-09-19  Age: 43 y.o. MRN: [id number]  Chief Complaint  Patient presents with   Follow-up    2 month follow up    Patient is here today for her 2 months follow up.  She has been feeling fairly well since last appointment.   She does have additional concerns to discuss today.  Labs are due today.  She needs refills.   I have reviewed her active problem list, medication list, allergies, notes from last encounter, lab results for her appointment today.      No other concerns at this time.   Past Medical History:  Diagnosis Date   Anemia    Anemia, iron  deficiency 04/10/2012   Appendicitis 06/04/2020   Constipation    Depression 04/26/2013   history - no current problems   Generalized headaches    Hemorrhoids    History of blood transfusion 02/2013   [facility]  2 units transfused    Medication exposure during first trimester of pregnancy 07/15/2011   No issues at this time     Nausea & vomiting    resolved after delivery - C/S, extreme   Normocytic anemia 12/13/2011   Monitoring closely and hem/onc is involved     Rectal bleeding    Rectal pain    Seasonal allergies    Seizure (HCC)    Seizures (HCC)    last seizure was  08/2012   Thalassemia    Weakness    resolved after C/S delivery    Past Surgical History:  Procedure Laterality Date   ABDOMINAL HYSTERECTOMY     CARPAL TUNNEL RELEASE  01/2009   right   CESAREAN SECTION  12/31/2011   Procedure: CESAREAN SECTION;  Surgeon: Toribio VEAR Ada, MD;  Location: [facility];  Service: Gynecology;  Laterality: N/A;   DILATION AND CURETTAGE OF UTERUS     endometrial ablation   EVALUATION UNDER ANESTHESIA WITH HEMORRHOIDECTOMY N/A 05/31/2013   Procedure: EXAM UNDER ANESTHESIA WITH EXTERNAL HEMORRHOIDECTOMY;  Surgeon: Elspeth KYM Schultze, MD;  Location: [facility];  Service: General;  Laterality: N/A;   LABIOPLASTY  03/27/2012    Procedure: LABIAPLASTY;  Surgeon: Toribio VEAR Ada, MD;  Location: [facility];  Service: Gynecology;  Laterality: N/A;  labia   LAPAROSCOPIC APPENDECTOMY N/A 06/05/2020   Procedure: APPENDECTOMY LAPAROSCOPIC;  Surgeon: Kinsinger, Herlene Righter, MD;  Location: [facility];  Service: General;  Laterality: N/A;   LAPAROSCOPIC TUBAL LIGATION  03/27/2012   Procedure: LAPAROSCOPIC TUBAL LIGATION;  Surgeon: Toribio VEAR Ada, MD;  [facility]: [facility];  Service: Gynecology;  Laterality: Bilateral;  fallopian tubes caurtery   LAPAROSCOPY     removal of cyst   ROBOTIC ASSISTED TOTAL HYSTERECTOMY N/A 05/02/2013   Procedure: ROBOTIC ASSISTED TOTAL HYSTERECTOMY;  Surgeon: Dickie DELENA Carder, MD;  [facility];  Service: Gynecology;  Laterality: N/A;   TONGUE SURGERY     TRANSANAL HEMORRHOIDAL DEARTERIALIZATION N/A 05/31/2013   Procedure: THD HEMORRHOIDAL LIGATION/PEXY;  Surgeon: Elspeth KYM Schultze, MD;  Location: [facility];  Service: General;  Laterality: N/A;   TUBAL LIGATION     UNILATERAL SALPINGECTOMY Right 05/02/2013   Procedure: UNILATERAL SALPINGECTOMY;  Surgeon: Dickie DELENA Carder, MD;  [facility];  Service: Gynecology;  Laterality: Right;   WISDOM TOOTH EXTRACTION      Social History   Socioeconomic History   Marital status: Single    Spouse name: Not on file  Number of children: 1   Years of education: Some College   Highest education level: Not on file  Occupational History   Occupation: [employer]  Tobacco Use   Smoking status: Never   Smokeless tobacco: Never  Vaping Use   Vaping status: Never Used  Substance and Sexual Activity   Alcohol use: Yes    Comment: occ   Drug use: No   Sexual activity: Not on file  Other Topics Concern   Not on file  Social History Narrative   ** Merged History Encounter **       Lives at home with her son Caffeine use: none Right-handed   Social Drivers of Corporate investment banker Strain: Not on file  Food Insecurity: Low Risk  (11/06/2023)   Received from  [facility]   Food Insecurity    Within the past 12 months, did the food you bought just not last and you didn't have money to get more?: No    Within the past 12 months, did you worry that your food would run out before you got money to buy more?: No  Transportation Needs: Low Risk  (11/06/2023)   Received from [facility]   Transportation Needs    Within the past 12 months, has a lack of transportation kept you from medical appointments or from doing things needed for daily living?: No  Physical Activity: Not on file  Stress: Not on file  Social Connections: Not on file  Intimate Partner Violence: Not on file    Family History  Problem Relation Age of Onset   Diabetes Mother    Hyperlipidemia Father    Cancer Maternal Grandfather    Hyperlipidemia Paternal Grandfather    Diabetes Maternal Grandmother    Anesthesia problems Neg Hx    Hypotension Neg Hx    Malignant hyperthermia Neg Hx    Pseudochol deficiency Neg Hx     Allergies  Allergen Reactions   Cephalexin  Hives   Latex Itching, Swelling and Rash   Keflex  [Cephalexin ] Hives    Review of Systems  All other systems reviewed and are negative.      Objective:   BP 112/82   Pulse 78   Ht 5' 4 (1.626 m)   Wt 184 lb 12.8 oz (83.8 kg)   LMP 04/25/2013   SpO2 99%   BMI 31.72 kg/m   Vitals:   12/05/23 1107  BP: 112/82  Pulse: 78  Height: 5' 4 (1.626 m)  Weight: 184 lb 12.8 oz (83.8 kg)  SpO2: 99%  BMI (Calculated): 31.71    Physical Exam Vitals and nursing note reviewed.  Constitutional:      Appearance: Normal appearance. She is normal weight.  HENT:     Head: Normocephalic.  Eyes:     Extraocular Movements: Extraocular movements intact.     Conjunctiva/sclera: Conjunctivae normal.     Pupils: Pupils are equal, round, and reactive to light.  Cardiovascular:     Rate and Rhythm: Normal rate.  Pulmonary:     Effort: Pulmonary effort is normal.  Neurological:      General: No focal deficit present.     Mental Status: She is alert and oriented to person, place, and time. Mental status is at baseline.  Psychiatric:        Mood and Affect: Mood normal.        Behavior: Behavior normal.        Thought Content: Thought content normal.  Judgment: Judgment normal.      No results found for any visits on 12/05/23.  Recent Results (from the past 2160 hours)  CBC with Diff     Status: Abnormal   Collection Time: 11/16/23 11:27 AM  Result Value Ref Range   WBC 7.0 3.4 - 10.8 x10E3/uL   RBC 4.67 3.77 - 5.28 x10E6/uL    Comment: Polychromasia present   Hemoglobin 9.7 (L) 11.1 - 15.9 g/dL   Hematocrit 65.3 65.9 - 46.6 %   MCV 74 (L) 79 - 97 fL   MCH 20.8 (L) 26.6 - 33.0 pg   MCHC 28.0 (L) 31.5 - 35.7 g/dL   RDW 75.0 (H) 88.2 - 84.5 %   Platelets 400 150 - 450 x10E3/uL   Neutrophils 61 Not Estab. %   Lymphs 28 Not Estab. %   Monocytes 7 Not Estab. %   Eos 2 Not Estab. %   Basos 1 Not Estab. %   Neutrophils Absolute 4.3 1.4 - 7.0 x10E3/uL   Lymphocytes Absolute 2.0 0.7 - 3.1 x10E3/uL   Monocytes Absolute 0.5 0.1 - 0.9 x10E3/uL   EOS (ABSOLUTE) 0.1 0.0 - 0.4 x10E3/uL   Basophils Absolute 0.0 0.0 - 0.2 x10E3/uL   Immature Granulocytes 1 Not Estab. %   Immature Grans (Abs) 0.1 0.0 - 0.1 x10E3/uL   Hematology Comments: Note:     Comment: Verified by microscopic examination.       Assessment & Plan Type 2 diabetes mellitus with hyperglycemia, without long-term current use of insulin  (HCC) Checking labs today. Will call pt. With results  Continue current diabetes POC, as patient has been well controlled on current regimen.  Will adjust meds if needed based on labs.   -CBC w/Diff -CMP w/eGFR -Hemoglobin A1C  B12 deficiency due to diet Vitamin D  deficiency, unspecified Other fatigue Checking labs today.  Will continue supplements as needed.   - Vitamin D  - Vitamin B12 - TSH  Beta thalassemia (HCC) Patient is seen by hematology,  who manage this condition.  She is well controlled with current therapy.   Will defer to them for further changes to plan of care.  Mixed hyperlipidemia Checking labs today.  Continue current therapy for lipid control. Will modify as needed based on labwork results.   -CMP w/eGFR -Lipid Panel   Return in about 1 month (around 01/04/2024).   Total time spent: 20 minutes  ALAN CHRISTELLA ARRANT, FNP  12/05/2023   This document may have been prepared by [facility] Voice Recognition software and as such may include unintentional dictation errors.

## 2023-12-06 LAB — CBC WITH DIFFERENTIAL/PLATELET
Basophils Absolute: 0 10*3/uL (ref 0.0–0.2)
Basos: 1 %
EOS (ABSOLUTE): 0.1 10*3/uL (ref 0.0–0.4)
Eos: 2 %
Hematocrit: 35.7 % (ref 34.0–46.6)
Hemoglobin: 11 g/dL — ABNORMAL LOW (ref 11.1–15.9)
Immature Grans (Abs): 0 10*3/uL (ref 0.0–0.1)
Immature Granulocytes: 0 %
Lymphocytes Absolute: 2.6 10*3/uL (ref 0.7–3.1)
Lymphs: 35 %
MCH: 21.1 pg — ABNORMAL LOW (ref 26.6–33.0)
MCHC: 30.8 g/dL — ABNORMAL LOW (ref 31.5–35.7)
MCV: 69 fL — ABNORMAL LOW (ref 79–97)
Monocytes Absolute: 0.5 10*3/uL (ref 0.1–0.9)
Monocytes: 6 %
Neutrophils Absolute: 4.1 10*3/uL (ref 1.4–7.0)
Neutrophils: 56 %
Platelets: 262 10*3/uL (ref 150–450)
RBC: 5.21 x10E6/uL (ref 3.77–5.28)
RDW: 23.5 % — ABNORMAL HIGH (ref 11.7–15.4)
WBC: 7.3 10*3/uL (ref 3.4–10.8)

## 2023-12-06 LAB — CMP14+EGFR
ALT: 11 IU/L (ref 0–32)
AST: 11 IU/L (ref 0–40)
Albumin: 4 g/dL (ref 3.9–4.9)
Alkaline Phosphatase: 68 IU/L (ref 44–121)
BUN/Creatinine Ratio: 16 (ref 9–23)
BUN: 9 mg/dL (ref 6–24)
Bilirubin Total: 0.5 mg/dL (ref 0.0–1.2)
CO2: 22 mmol/L (ref 20–29)
Calcium: 9.3 mg/dL (ref 8.7–10.2)
Chloride: 104 mmol/L (ref 96–106)
Creatinine, Ser: 0.57 mg/dL (ref 0.57–1.00)
Globulin, Total: 2.3 g/dL (ref 1.5–4.5)
Glucose: 81 mg/dL (ref 70–99)
Potassium: 4.2 mmol/L (ref 3.5–5.2)
Sodium: 137 mmol/L (ref 134–144)
Total Protein: 6.3 g/dL (ref 6.0–8.5)
eGFR: 115 mL/min/{1.73_m2} (ref >59)

## 2023-12-06 LAB — LIPID PANEL
Chol/HDL Ratio: 2.6 ratio (ref 0.0–4.4)
Cholesterol, Total: 154 mg/dL (ref 100–199)
HDL: 60 mg/dL (ref >39)
LDL Chol Calc (NIH): 80 mg/dL (ref 0–99)
Triglycerides: 71 mg/dL (ref 0–149)
VLDL Cholesterol Cal: 14 mg/dL (ref 5–40)

## 2023-12-06 LAB — TSH: TSH: 1.86 u[IU]/mL (ref 0.450–4.500)

## 2023-12-06 LAB — HEMOGLOBIN A1C
Est. average glucose Bld gHb Est-mCnc: 105 mg/dL
Hgb A1c MFr Bld: 5.3 % (ref 4.8–5.6)

## 2023-12-06 LAB — VITAMIN D 25 HYDROXY (VIT D DEFICIENCY, FRACTURES): Vit D, 25-Hydroxy: 18.9 ng/mL — ABNORMAL LOW (ref 30.0–100.0)

## 2023-12-06 LAB — VITAMIN B12: Vitamin B-12: 615 pg/mL (ref 232–1245)

## 2023-12-11 ENCOUNTER — Encounter: Payer: Self-pay | Admitting: Family

## 2023-12-11 DIAGNOSIS — E782 Mixed hyperlipidemia: Secondary | ICD-10-CM | POA: Insufficient documentation

## 2023-12-11 DIAGNOSIS — I10 Essential (primary) hypertension: Secondary | ICD-10-CM | POA: Insufficient documentation

## 2023-12-11 NOTE — Assessment & Plan Note (Signed)
 Checking labs today. Will call pt. With results  Continue current diabetes POC, as patient has been well controlled on current regimen.  Will adjust meds if needed based on labs.

## 2023-12-11 NOTE — Assessment & Plan Note (Signed)
 Blood pressure well controlled with current medications.  Continue current therapy.  Will reassess at follow up.

## 2023-12-11 NOTE — Assessment & Plan Note (Signed)
 Continue current meds.  Will adjust as needed based on results.  The patient is asked to make an attempt to improve diet and exercise patterns to aid in medical management of this problem. Addressed importance of increasing and maintaining water intake.

## 2023-12-11 NOTE — Progress Notes (Signed)
 Established Patient Office Visit  Subjective:  Patient ID: Abigail Hall, female    DOB: 1981/04/03  Age: 43 y.o. MRN: [id number]  Chief Complaint  Patient presents with   Follow-up    2 month follow up    Patient is here today for her 2 months follow up.  She has been feeling well since last appointment.   She does not have additional concerns to discuss today.  Labs are not due today. She needs refills.   I have reviewed her active problem list, medication list, allergies, notes from last encounter, lab results for her appointment today.      No other concerns at this time.   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   Medication exposure during first trimester of pregnancy 07/15/2011   No issues at this time     Nausea & vomiting    resolved after delivery - C/S, extreme   Normocytic anemia 12/13/2011   Monitoring closely and hem/onc is involved     Rectal bleeding    Rectal pain    Seasonal allergies    Seizure (HCC)    Seizures (HCC)    last seizure was  08/2012   Thalassemia    Weakness    resolved after C/S delivery    Past Surgical History:  Procedure Laterality Date   ABDOMINAL HYSTERECTOMY     CARPAL TUNNEL RELEASE  01/2009   right   CESAREAN SECTION  12/31/2011   Procedure: CESAREAN SECTION;  Surgeon: Jimmey Mould, MD;  [facility]: [facility];  Service: Gynecology;  Laterality: N/A;   DILATION AND CURETTAGE OF UTERUS     endometrial ablation   EVALUATION UNDER ANESTHESIA WITH HEMORRHOIDECTOMY N/A 05/31/2013   Procedure: EXAM UNDER ANESTHESIA WITH EXTERNAL HEMORRHOIDECTOMY;  Surgeon: Eddye Goodie, MD;  [facility]: [facility];  Service:  General;  Laterality: N/A;   LABIOPLASTY  03/27/2012   Procedure: LABIAPLASTY;  Surgeon: Jimmey Mould, MD;  [facility]: [facility];  Service: Gynecology;  Laterality: N/A;  labia   LAPAROSCOPIC APPENDECTOMY N/A 06/05/2020   Procedure: APPENDECTOMY LAPAROSCOPIC;  Surgeon: Kinsinger, Alphonso Aschoff, MD;  Location: [facility];  Service: General;  Laterality: N/A;   LAPAROSCOPIC TUBAL LIGATION  03/27/2012   Procedure: LAPAROSCOPIC TUBAL LIGATION;  Surgeon: Jimmey Mould, MD;  [facility];  Service: Gynecology;  Laterality: Bilateral;  fallopian tubes caurtery   LAPAROSCOPY     removal of cyst   ROBOTIC ASSISTED TOTAL HYSTERECTOMY N/A 05/02/2013   Procedure: ROBOTIC ASSISTED TOTAL HYSTERECTOMY;  Surgeon: Kandra Orn, MD;  [facility]: [facility];  Service: Gynecology;  Laterality: N/A;   TONGUE SURGERY     TRANSANAL HEMORRHOIDAL DEARTERIALIZATION N/A 05/31/2013   Procedure: THD HEMORRHOIDAL LIGATION/PEXY;  Surgeon: Eddye Goodie, MD;  Location: [facility];  Service: General;  Laterality: N/A;   TUBAL LIGATION     UNILATERAL SALPINGECTOMY Right 05/02/2013   Procedure: UNILATERAL SALPINGECTOMY;  Surgeon: Kandra Orn, MD;  Location[facility];  Service: Gynecology;  Laterality: Right;   WISDOM TOOTH EXTRACTION      Social History   Socioeconomic History   Marital status: Single  Spouse name: Not on file   Number of children: 1   Years of education: Some College   Highest education level: Not on file  Occupational History   Occupation: BOA  Tobacco Use   Smoking status: Never   Smokeless tobacco: Never  Vaping Use   Vaping status: Never Used  Substance and Sexual Activity   Alcohol use: Yes    Comment: occ   Drug use: No   Sexual activity: Not on file  Other Topics Concern   Not on file  Social History Narrative   ** Merged History Encounter **       Lives at home with her son Caffeine use: none Right-handed   Social Drivers of Corporate investment banker Strain: Not on file   Food Insecurity: Low Risk  (11/06/2023)   Received from [facility]   Food Insecurity    Within the past 12 months, did the food you bought just not last and you didn't have money to get more?: No    Within the past 12 months, did you worry that your food would run out before you got money to buy more?: No  Transportation Needs: Low Risk  (11/06/2023)   Received from [facility]   Transportation Needs    Within the past 12 months, has a lack of transportation kept you from medical appointments or from doing things needed for daily living?: No  Physical Activity: Not on file  Stress: Not on file  Social Connections: Not on file  Intimate Partner Violence: Not on file    Family History  Problem Relation Age of Onset   Diabetes Mother    Hyperlipidemia Father    Cancer Maternal Grandfather    Hyperlipidemia Paternal Grandfather    Diabetes Maternal Grandmother    Anesthesia problems Neg Hx    Hypotension Neg Hx    Malignant hyperthermia Neg Hx    Pseudochol deficiency Neg Hx     Allergies  Allergen Reactions   Cephalexin  Hives   Latex Itching, Swelling and Rash   Keflex  [Cephalexin ] Hives    Review of Systems  All other systems reviewed and are negative.      Objective:   BP 108/68   Pulse 78   Ht 5\' 4"  (1.626 m)   Wt 191 lb (86.6 kg)   LMP 04/25/2013   SpO2 97%   BMI 32.79 kg/m   Vitals:   10/05/23 1037  BP: 108/68  Pulse: 78  Height: 5\' 4"  (1.626 m)  Weight: 191 lb (86.6 kg)  SpO2: 97%  BMI (Calculated): 32.77    Physical Exam Vitals and nursing note reviewed.  Constitutional:      Appearance: Normal appearance. She is normal weight.  HENT:     Head: Normocephalic.  Eyes:     Extraocular Movements: Extraocular movements intact.     Conjunctiva/sclera: Conjunctivae normal.     Pupils: Pupils are equal, round, and reactive to light.  Cardiovascular:     Rate and Rhythm: Normal rate.  Pulmonary:     Effort: Pulmonary  effort is normal.  Neurological:     General: No focal deficit present.     Mental Status: She is alert and oriented to person, place, and time. Mental status is at baseline.  Psychiatric:        Mood and Affect: Mood normal.        Behavior: Behavior normal.        Thought Content: Thought  content normal.        Judgment: Judgment normal.      No results found for any visits on 10/05/23.  Recent Results (from the past 2160 hours)  CBC with Diff     Status: Abnormal   Collection Time: 11/16/23 11:27 AM  Result Value Ref Range   WBC 7.0 3.4 - 10.8 x10E3/uL   RBC 4.67 3.77 - 5.28 x10E6/uL    Comment: Polychromasia present   Hemoglobin 9.7 (L) 11.1 - 15.9 g/dL   Hematocrit 60.4 54.0 - 46.6 %   MCV 74 (L) 79 - 97 fL   MCH 20.8 (L) 26.6 - 33.0 pg   MCHC 28.0 (L) 31.5 - 35.7 g/dL   RDW 98.1 (H) 19.1 - 47.8 %   Platelets 400 150 - 450 x10E3/uL   Neutrophils 61 Not Estab. %   Lymphs 28 Not Estab. %   Monocytes 7 Not Estab. %   Eos 2 Not Estab. %   Basos 1 Not Estab. %   Neutrophils Absolute 4.3 1.4 - 7.0 x10E3/uL   Lymphocytes Absolute 2.0 0.7 - 3.1 x10E3/uL   Monocytes Absolute 0.5 0.1 - 0.9 x10E3/uL   EOS (ABSOLUTE) 0.1 0.0 - 0.4 x10E3/uL   Basophils Absolute 0.0 0.0 - 0.2 x10E3/uL   Immature Granulocytes 1 Not Estab. %   Immature Grans (Abs) 0.1 0.0 - 0.1 x10E3/uL   Hematology Comments: Note:     Comment: Verified by microscopic examination.  CBC with Diff     Status: Abnormal   Collection Time: 12/05/23 11:49 AM  Result Value Ref Range   WBC 7.3 3.4 - 10.8 x10E3/uL   RBC 5.21 3.77 - 5.28 x10E6/uL    Comment: Few tear drops. Target cells present. Rare nucleated RBC seen. Polychromasia present Few schistocytes.    Hemoglobin 11.0 (L) 11.1 - 15.9 g/dL   Hematocrit 29.5 62.1 - 46.6 %   MCV 69 (L) 79 - 97 fL   MCH 21.1 (L) 26.6 - 33.0 pg   MCHC 30.8 (L) 31.5 - 35.7 g/dL   RDW 30.8 (H) 65.7 - 84.6 %   Platelets 262 150 - 450 x10E3/uL    Comment: Platelet count  verified by examination of peripheral blood smear.   Neutrophils 56 Not Estab. %    Comment: Occasional myelocyte seen on scanning.   Lymphs 35 Not Estab. %   Monocytes 6 Not Estab. %   Eos 2 Not Estab. %   Basos 1 Not Estab. %   Neutrophils Absolute 4.1 1.4 - 7.0 x10E3/uL   Lymphocytes Absolute 2.6 0.7 - 3.1 x10E3/uL   Monocytes Absolute 0.5 0.1 - 0.9 x10E3/uL   EOS (ABSOLUTE) 0.1 0.0 - 0.4 x10E3/uL   Basophils Absolute 0.0 0.0 - 0.2 x10E3/uL   Immature Granulocytes 0 Not Estab. %   Immature Grans (Abs) 0.0 0.0 - 0.1 x10E3/uL   Hematology Comments: Note:     Comment: Verified by microscopic examination.  Lipid panel     Status: None   Collection Time: 12/05/23 11:49 AM  Result Value Ref Range   Cholesterol, Total 154 100 - 199 mg/dL   Triglycerides 71 0 - 149 mg/dL   HDL 60 >96 mg/dL   VLDL Cholesterol Cal 14 5 - 40 mg/dL   LDL Chol Calc (NIH) 80 0 - 99 mg/dL   Chol/HDL Ratio 2.6 0.0 - 4.4 ratio    Comment:  T. Chol/HDL Ratio                                             Men  Women                               1/2 Avg.Risk  3.4    3.3                                   Avg.Risk  5.0    4.4                                2X Avg.Risk  9.6    7.1                                3X Avg.Risk 23.4   11.0   VITAMIN D  25 Hydroxy (Vit-D Deficiency, Fractures)     Status: Abnormal   Collection Time: 12/05/23 11:49 AM  Result Value Ref Range   Vit D, 25-Hydroxy 18.9 (L) 30.0 - 100.0 ng/mL    Comment: Vitamin D  deficiency has been defined by the Institute of Medicine and an Endocrine Society practice guideline as a level of serum 25-OH vitamin D  less than 20 ng/mL (1,2). The Endocrine Society went on to further define vitamin D  insufficiency as a level between 21 and 29 ng/mL (2). 1. IOM (Institute of Medicine). 2010. Dietary reference    intakes for calcium  and D. [place]: The    [employer]. 2. Holick MF, Binkley [place], Bischoff-Ferrari  HA, et al.    Evaluation, treatment, and prevention of vitamin D     deficiency: an Endocrine Society clinical practice    guideline. JCEM. 2011 Jul; 96(7):1911-30.   CMP14+EGFR     Status: None   Collection Time: 12/05/23 11:49 AM  Result Value Ref Range   Glucose 81 70 - 99 mg/dL   BUN 9 6 - 24 mg/dL   Creatinine, Ser 1.61 0.57 - 1.00 mg/dL   eGFR 096 >04 VW/UJW/1.19   BUN/Creatinine Ratio 16 9 - 23   Sodium 137 134 - 144 mmol/L   Potassium 4.2 3.5 - 5.2 mmol/L   Chloride 104 96 - 106 mmol/L   CO2 22 20 - 29 mmol/L   Calcium  9.3 8.7 - 10.2 mg/dL   Total Protein 6.3 6.0 - 8.5 g/dL   Albumin 4.0 3.9 - 4.9 g/dL   Globulin, Total 2.3 1.5 - 4.5 g/dL   Bilirubin Total 0.5 0.0 - 1.2 mg/dL   Alkaline Phosphatase 68 44 - 121 IU/L   AST 11 0 - 40 IU/L   ALT 11 0 - 32 IU/L  TSH     Status: None   Collection Time: 12/05/23 11:49 AM  Result Value Ref Range   TSH 1.860 0.450 - 4.500 uIU/mL  Hemoglobin A1c     Status: None   Collection Time: 12/05/23 11:49 AM  Result Value Ref Range   Hgb A1c MFr Bld 5.3 4.8 - 5.6 %    Comment:          Prediabetes: 5.7 - 6.4  Diabetes: >6.4          Glycemic control for adults with diabetes: <7.0    Est. average glucose Bld gHb Est-mCnc 105 mg/dL  Vitamin B12     Status: None   Collection Time: 12/05/23 11:49 AM  Result Value Ref Range   Vitamin B-12 615 232 - 1,245 pg/mL       Assessment & Plan:   Problem List Items Addressed This Visit       Cardiovascular and Mediastinum   Essential hypertension, benign   Blood pressure well controlled with current medications.  Continue current therapy.  Will reassess at follow up.          Endocrine   Type 2 diabetes mellitus with hyperglycemia, without long-term current use of insulin  (HCC) - Primary   Checking labs today. Will call pt. With results  Continue current diabetes POC, as patient has been well controlled on current regimen.  Will adjust meds if needed based on labs.         Relevant Orders   POCT CBG (Fasting - Glucose)     Other   Morbid obesity (HCC)   Continue current meds.  Will adjust as needed based on results.  The patient is asked to make an attempt to improve diet and exercise patterns to aid in medical management of this problem. Addressed importance of increasing and maintaining water intake.        Mixed hyperlipidemia   Continue current therapy for lipid control. Will modify as needed based on labwork results.         Return in about 2 months (around 12/03/2023).   Total time spent: 20 minutes  Trenda Frisk, FNP  10/05/2023   This document may have been prepared by [facility] Voice Recognition software and as such may include unintentional dictation errors.

## 2023-12-11 NOTE — Assessment & Plan Note (Signed)
 Continue current therapy for lipid control. Will modify as needed based on labwork results.

## 2024-01-03 ENCOUNTER — Ambulatory Visit: Admitting: Family

## 2024-01-04 ENCOUNTER — Encounter: Payer: Self-pay | Admitting: Family

## 2024-01-04 ENCOUNTER — Ambulatory Visit: Payer: Self-pay

## 2024-01-04 ENCOUNTER — Ambulatory Visit: Admitting: Family

## 2024-01-04 VITALS — BP 115/72 | HR 104 | Ht 64.0 in | Wt 180.0 lb

## 2024-01-04 DIAGNOSIS — E1165 Type 2 diabetes mellitus with hyperglycemia: Secondary | ICD-10-CM

## 2024-01-04 DIAGNOSIS — H1033 Unspecified acute conjunctivitis, bilateral: Secondary | ICD-10-CM | POA: Diagnosis not present

## 2024-01-04 DIAGNOSIS — R300 Dysuria: Secondary | ICD-10-CM

## 2024-01-04 DIAGNOSIS — Z0130 Encounter for examination of blood pressure without abnormal findings: Secondary | ICD-10-CM

## 2024-01-04 LAB — URINALYSIS, ROUTINE W REFLEX MICROSCOPIC
Bilirubin, UA: NEGATIVE
Glucose, UA: NEGATIVE
Ketones, UA: NEGATIVE
Leukocytes,UA: NEGATIVE
Nitrite, UA: NEGATIVE
Protein,UA: NEGATIVE
RBC, UA: NEGATIVE
Specific Gravity, UA: 1.014 (ref 1.005–1.030)
Urobilinogen, Ur: 0.2 mg/dL (ref 0.2–1.0)
pH, UA: 7.5 (ref 5.0–7.5)

## 2024-01-04 LAB — POCT URINALYSIS DIPSTICK
Bilirubin, UA: NEGATIVE
Blood, UA: NEGATIVE
Glucose, UA: NEGATIVE
Ketones, UA: NEGATIVE
Leukocytes, UA: NEGATIVE
Nitrite, UA: NEGATIVE
Protein, UA: NEGATIVE
Spec Grav, UA: 1.015 (ref 1.010–1.025)
Urobilinogen, UA: 0.2 U/dL
pH, UA: 7 (ref 5.0–8.0)

## 2024-01-04 LAB — GLUCOSE, POCT (MANUAL RESULT ENTRY): POC Glucose: 91 mg/dL (ref 70–99)

## 2024-01-04 MED ORDER — TOBRAMYCIN-DEXAMETHASONE 0.3-0.1 % OP SUSP: 1.0000 [drp] | OPHTHALMIC | 0 refills | Status: AC

## 2024-01-04 NOTE — Progress Notes (Signed)
 Established Patient Office Visit  Subjective:  Patient ID: Abigail Hall, female    DOB: 1981-08-05  Age: 43 y.o. MRN: [id number]  Chief Complaint  Patient presents with   Follow-up    1 month follow up    Patient is here today for her 1 month follow up.  She has been feeling fairly well since last appointment.   She does have additional concerns to discuss today.  Labs are not due today.  She needs refills.   I have reviewed her active problem list, medication list, allergies, notes from last encounter, lab results for her appointment today.      No other concerns at this time.   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/01/2017   Medication exposure during first trimester of pregnancy 07/15/2011   No issues at this time     Nausea & vomiting    resolved after delivery - C/S, extreme   Normocytic anemia 12/13/2011   Monitoring closely and hem/onc is involved     Rectal bleeding    Rectal pain    Seasonal allergies    Seizure (HCC)    Seizures (HCC)    last seizure was  08/2012   Thalassemia    Weakness    resolved after C/S delivery    Past Surgical History:  Procedure Laterality Date   ABDOMINAL HYSTERECTOMY     CARPAL TUNNEL RELEASE  01/2009   right   CESAREAN SECTION  12/31/2011   Procedure: CESAREAN SECTION;  Surgeon: Toribio VEAR Ada, MD;  Location: [facility];  Service: Gynecology;  Laterality: N/A;   DILATION AND CURETTAGE OF UTERUS     endometrial ablation   EVALUATION UNDER ANESTHESIA WITH HEMORRHOIDECTOMY N/A 05/31/2013   Procedure: EXAM UNDER ANESTHESIA WITH EXTERNAL HEMORRHOIDECTOMY;  Surgeon: Elspeth KYM Schultze, MD;  Location: [facility];  Service:  General;  Laterality: N/A;   LABIOPLASTY  03/27/2012   Procedure: LABIAPLASTY;  Surgeon: Toribio VEAR Ada, MD;  Location: [facility];  Service: Gynecology;  Laterality: N/A;  labia   LAPAROSCOPIC APPENDECTOMY N/A 06/05/2020   Procedure: APPENDECTOMY LAPAROSCOPIC;  Surgeon: Kinsinger, Herlene Righter, MD;  Location: [facility];  Service: General;  Laterality: N/A;   LAPAROSCOPIC TUBAL LIGATION  03/27/2012   Procedure: LAPAROSCOPIC TUBAL LIGATION;  Surgeon: Toribio VEAR Ada, MD;  Location: [facility];  Service: Gynecology;  Laterality: Bilateral;  fallopian tubes caurtery   LAPAROSCOPY     removal of cyst   ROBOTIC ASSISTED TOTAL HYSTERECTOMY N/A 05/02/2013   Procedure: ROBOTIC ASSISTED TOTAL HYSTERECTOMY;  Surgeon: Dickie DELENA Carder, MD;  Location: [facility];  Service: Gynecology;  Laterality: N/A;   TONGUE SURGERY     TRANSANAL HEMORRHOIDAL DEARTERIALIZATION N/A 05/31/2013   Procedure: THD HEMORRHOIDAL LIGATION/PEXY;  Surgeon: Elspeth KYM Schultze, MD;  Location: [facility];  Service: General;  Laterality: N/A;   TUBAL LIGATION     UNILATERAL SALPINGECTOMY Right 05/02/2013   Procedure: UNILATERAL SALPINGECTOMY;  Surgeon: Dickie DELENA Carder, MD;  Location: [facility];  Service: Gynecology;  Laterality: Right;   WISDOM TOOTH EXTRACTION      Social History   Socioeconomic History   Marital status: Single  Spouse name: Not on file   Number of children: 1   Years of education: Some College   Highest education level: Not on file  Occupational History   Occupation: [employer]  Tobacco Use   Smoking status: Never   Smokeless tobacco: Never  Vaping Use   Vaping status: Never Used  Substance and Sexual Activity   Alcohol use: Yes    Comment: occ   Drug use: No   Sexual activity: Not on file  Other Topics Concern   Not on file  Social History Narrative   ** Merged History Encounter **       Lives at home with her son Caffeine use: none Right-handed   Social Drivers of Corporate investment banker Strain: Not on file   Food Insecurity: Low Risk  (11/06/2023)   Received from [facility]   Food Insecurity    Within the past 12 months, did the food you bought just not last and you didn't have money to get more?: No    Within the past 12 months, did you worry that your food would run out before you got money to buy more?: No  Transportation Needs: Low Risk  (11/06/2023)   Received from [facility]   Transportation Needs    Within the past 12 months, has a lack of transportation kept you from medical appointments or from doing things needed for daily living?: No  Physical Activity: Not on file  Stress: Not on file  Social Connections: Not on file  Intimate Partner Violence: Not on file    Family History  Problem Relation Age of Onset   Diabetes Mother    Hyperlipidemia Father    Cancer Maternal Grandfather    Hyperlipidemia Paternal Grandfather    Diabetes Maternal Grandmother    Anesthesia problems Neg Hx    Hypotension Neg Hx    Malignant hyperthermia Neg Hx    Pseudochol deficiency Neg Hx     Allergies  Allergen Reactions   Cephalexin  Hives   Latex Itching, Swelling and Rash   Keflex  [Cephalexin ] Hives    Review of Systems  All other systems reviewed and are negative.      Objective:   BP 115/72   Pulse (!) 104   Ht 5' 4 (1.626 m)   Wt 180 lb (81.6 kg)   LMP 04/25/2013   SpO2 96%   BMI 30.90 kg/m   Vitals:   01/04/24 1318  BP: 115/72  Pulse: (!) 104  Height: 5' 4 (1.626 m)  Weight: 180 lb (81.6 kg)  SpO2: 96%  BMI (Calculated): 30.88    Physical Exam Vitals and nursing note reviewed.  Constitutional:      Appearance: Normal appearance. She is normal weight.  HENT:     Head: Normocephalic.  Eyes:     Extraocular Movements: Extraocular movements intact.     Conjunctiva/sclera: Conjunctivae normal.     Pupils: Pupils are equal, round, and reactive to light.  Cardiovascular:     Rate and Rhythm: Normal rate.  Pulmonary:     Effort:  Pulmonary effort is normal.  Neurological:     General: No focal deficit present.     Mental Status: She is alert and oriented to person, place, and time. Mental status is at baseline.  Psychiatric:        Mood and Affect: Mood normal.        Behavior: Behavior normal.        Thought  Content: Thought content normal.        Judgment: Judgment normal.      Results for orders placed or performed in visit on 01/04/24  POCT Glucose (CBG)  Result Value Ref Range   POC Glucose 91 70 - 99 mg/dl    Recent Results (from the past 2160 hours)  CBC with Diff     Status: Abnormal   Collection Time: 11/16/23 11:27 AM  Result Value Ref Range   WBC 7.0 3.4 - 10.8 x10E3/uL   RBC 4.67 3.77 - 5.28 x10E6/uL    Comment: Polychromasia present   Hemoglobin 9.7 (L) 11.1 - 15.9 g/dL   Hematocrit 65.3 65.9 - 46.6 %   MCV 74 (L) 79 - 97 fL   MCH 20.8 (L) 26.6 - 33.0 pg   MCHC 28.0 (L) 31.5 - 35.7 g/dL   RDW 75.0 (H) 88.2 - 84.5 %   Platelets 400 150 - 450 x10E3/uL   Neutrophils 61 Not Estab. %   Lymphs 28 Not Estab. %   Monocytes 7 Not Estab. %   Eos 2 Not Estab. %   Basos 1 Not Estab. %   Neutrophils Absolute 4.3 1.4 - 7.0 x10E3/uL   Lymphocytes Absolute 2.0 0.7 - 3.1 x10E3/uL   Monocytes Absolute 0.5 0.1 - 0.9 x10E3/uL   EOS (ABSOLUTE) 0.1 0.0 - 0.4 x10E3/uL   Basophils Absolute 0.0 0.0 - 0.2 x10E3/uL   Immature Granulocytes 1 Not Estab. %   Immature Grans (Abs) 0.1 0.0 - 0.1 x10E3/uL   Hematology Comments: Note:     Comment: Verified by microscopic examination.  CBC with Diff     Status: Abnormal   Collection Time: 12/05/23 11:49 AM  Result Value Ref Range   WBC 7.3 3.4 - 10.8 x10E3/uL   RBC 5.21 3.77 - 5.28 x10E6/uL    Comment: Few tear drops. Target cells present. Rare nucleated RBC seen. Polychromasia present Few schistocytes.    Hemoglobin 11.0 (L) 11.1 - 15.9 g/dL   Hematocrit 64.2 65.9 - 46.6 %   MCV 69 (L) 79 - 97 fL   MCH 21.1 (L) 26.6 - 33.0 pg   MCHC 30.8 (L) 31.5 -  35.7 g/dL   RDW 76.4 (H) 88.2 - 84.5 %   Platelets 262 150 - 450 x10E3/uL    Comment: Platelet count verified by examination of peripheral blood smear.   Neutrophils 56 Not Estab. %    Comment: Occasional myelocyte seen on scanning.   Lymphs 35 Not Estab. %   Monocytes 6 Not Estab. %   Eos 2 Not Estab. %   Basos 1 Not Estab. %   Neutrophils Absolute 4.1 1.4 - 7.0 x10E3/uL   Lymphocytes Absolute 2.6 0.7 - 3.1 x10E3/uL   Monocytes Absolute 0.5 0.1 - 0.9 x10E3/uL   EOS (ABSOLUTE) 0.1 0.0 - 0.4 x10E3/uL   Basophils Absolute 0.0 0.0 - 0.2 x10E3/uL   Immature Granulocytes 0 Not Estab. %   Immature Grans (Abs) 0.0 0.0 - 0.1 x10E3/uL   Hematology Comments: Note:     Comment: Verified by microscopic examination.  Lipid panel     Status: None   Collection Time: 12/05/23 11:49 AM  Result Value Ref Range   Cholesterol, Total 154 100 - 199 mg/dL   Triglycerides 71 0 - 149 mg/dL   HDL 60 >60 mg/dL   VLDL Cholesterol Cal 14 5 - 40 mg/dL   LDL Chol Calc (NIH) 80 0 - 99 mg/dL   Chol/HDL Ratio 2.6 0.0 - 4.4  ratio    Comment:                                   T. Chol/HDL Ratio                                             Men  Women                               1/2 Avg.Risk  3.4    3.3                                   Avg.Risk  5.0    4.4                                2X Avg.Risk  9.6    7.1                                3X Avg.Risk 23.4   11.0   VITAMIN D  25 Hydroxy (Vit-D Deficiency, Fractures)     Status: Abnormal   Collection Time: 12/05/23 11:49 AM  Result Value Ref Range   Vit D, 25-Hydroxy 18.9 (L) 30.0 - 100.0 ng/mL    Comment: Vitamin D  deficiency has been defined by the Institute of Medicine and an Endocrine Society practice guideline as a level of serum 25-OH vitamin D  less than 20 ng/mL (1,2). The Endocrine Society went on to further define vitamin D  insufficiency as a level between 21 and 29 ng/mL (2). 1. IOM (Institute of Medicine). 2010. Dietary reference    intakes for  calcium  and D. [place]: The    [employer]. 2. Holick MF, Binkley , Bischoff-Ferrari HA, et al.    Evaluation, treatment, and prevention of vitamin D     deficiency: an Endocrine Society clinical practice    guideline. JCEM. 2011 Jul; 96(7):1911-30.   CMP14+EGFR     Status: None   Collection Time: 12/05/23 11:49 AM  Result Value Ref Range   Glucose 81 70 - 99 mg/dL   BUN 9 6 - 24 mg/dL   Creatinine, Ser 9.42 0.57 - 1.00 mg/dL   eGFR 884 >40 fO/fpw/8.26   BUN/Creatinine Ratio 16 9 - 23   Sodium 137 134 - 144 mmol/L   Potassium 4.2 3.5 - 5.2 mmol/L   Chloride 104 96 - 106 mmol/L   CO2 22 20 - 29 mmol/L   Calcium  9.3 8.7 - 10.2 mg/dL   Total Protein 6.3 6.0 - 8.5 g/dL   Albumin 4.0 3.9 - 4.9 g/dL   Globulin, Total 2.3 1.5 - 4.5 g/dL   Bilirubin Total 0.5 0.0 - 1.2 mg/dL   Alkaline Phosphatase 68 44 - 121 IU/L   AST 11 0 - 40 IU/L   ALT 11 0 - 32 IU/L  TSH     Status: None   Collection Time: 12/05/23 11:49 AM  Result Value Ref Range   TSH 1.860 0.450 - 4.500 uIU/mL  Hemoglobin A1c     Status: None   Collection Time: 12/05/23  11:49 AM  Result Value Ref Range   Hgb A1c MFr Bld 5.3 4.8 - 5.6 %    Comment:          Prediabetes: 5.7 - 6.4          Diabetes: >6.4          Glycemic control for adults with diabetes: <7.0    Est. average glucose Bld gHb Est-mCnc 105 mg/dL  Vitamin B12     Status: None   Collection Time: 12/05/23 11:49 AM  Result Value Ref Range   Vitamin B-12 615 232 - 1,245 pg/mL  POCT Glucose (CBG)     Status: Normal   Collection Time: 01/04/24  1:24 PM  Result Value Ref Range   POC Glucose 91 70 - 99 mg/dl       Assessment & Plan Type 2 diabetes mellitus with hyperglycemia, without long-term current use of insulin  (HCC) Checking labs today. Will call pt. With results  Continue current diabetes POC, as patient has been well controlled on current regimen.  Will adjust meds if needed based on labs.   -CBC w/Diff -CMP  w/eGFR -Hemoglobin A1C  Dysuria UA in office today abnormal. Sending for UA/UC.   Will call with results and send antibiotics if needed.   Acute bacterial conjunctivitis of both eyes RX sent to pharmacy.  Will reassess at follow up if not improved.    Return in about 1 month (around 02/04/2024).   Total time spent: 20 minutes  ALAN CHRISTELLA ARRANT, FNP  01/04/2024   This document may have been prepared by [facility] Voice Recognition software and as such may include unintentional dictation errors.

## 2024-01-04 NOTE — Progress Notes (Signed)
 Patient notified

## 2024-01-06 LAB — URINE CULTURE

## 2024-02-06 ENCOUNTER — Ambulatory Visit: Admitting: Family

## 2024-02-06 ENCOUNTER — Encounter: Payer: Self-pay | Admitting: Family

## 2024-02-06 VITALS — BP 100/70 | HR 83 | Ht 64.0 in | Wt 171.6 lb

## 2024-02-06 DIAGNOSIS — Z0130 Encounter for examination of blood pressure without abnormal findings: Secondary | ICD-10-CM

## 2024-02-06 DIAGNOSIS — K648 Other hemorrhoids: Secondary | ICD-10-CM

## 2024-02-06 DIAGNOSIS — K641 Second degree hemorrhoids: Secondary | ICD-10-CM | POA: Diagnosis not present

## 2024-02-06 DIAGNOSIS — E1165 Type 2 diabetes mellitus with hyperglycemia: Secondary | ICD-10-CM

## 2024-02-06 NOTE — Progress Notes (Signed)
 Established Patient Office Visit  Subjective:  Patient ID: Abigail Hall, female    DOB: Jul 08, 1981  Age: 43 y.o. MRN: [id number]  Chief Complaint  Patient presents with   Follow-up    1 month follow up    Patient is here today for her 1 month follow up.  She has been feeling fairly well since last appointment.   She does have additional concerns to discuss today.  She had a severe episode with her hemorrhoids a few weeks ago, says they were bleeding significantly.  Asks if we can refer her to GI since this has been a recurrent issue for her.   Labs are not due today. She needs refills.   I have reviewed her active problem list, medication list, allergies, notes from last encounter, lab results for her appointment today.     No other concerns at this time.   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   Medication exposure during first trimester of pregnancy 07/15/2011   No issues at this time     Nausea & vomiting    resolved after delivery - C/S, extreme   Normocytic anemia 12/13/2011   Monitoring closely and hem/onc is involved     Rectal bleeding    Rectal pain    Seasonal allergies    Seizure (HCC)    Seizures (HCC)    last seizure was  08/2012   Thalassemia    Weakness    resolved after C/S delivery    Past Surgical History:  Procedure Laterality Date   ABDOMINAL HYSTERECTOMY     CARPAL TUNNEL RELEASE  01/2009   right   CESAREAN SECTION  12/31/2011   Procedure: CESAREAN SECTION;  Surgeon: Toribio VEAR Ada, MD;  Location: [facility];  Service: Gynecology;  Laterality: N/A;   DILATION AND CURETTAGE OF UTERUS     endometrial ablation    EVALUATION UNDER ANESTHESIA WITH HEMORRHOIDECTOMY N/A 05/31/2013   Procedure: EXAM UNDER ANESTHESIA WITH EXTERNAL HEMORRHOIDECTOMY;  Surgeon: Elspeth KYM Schultze, MD;  Location: [facility];  Service: General;  Laterality: N/A;   LABIOPLASTY  03/27/2012   Procedure: LABIAPLASTY;  Surgeon: Toribio VEAR Ada, MD;  Location: [facility];  Service: Gynecology;  Laterality: N/A;  labia   LAPAROSCOPIC APPENDECTOMY N/A 06/05/2020   Procedure: APPENDECTOMY LAPAROSCOPIC;  Surgeon: Kinsinger, Herlene Righter, MD;  Location: [facility];  Service: General;  Laterality: N/A;   LAPAROSCOPIC TUBAL LIGATION  03/27/2012   Procedure: LAPAROSCOPIC TUBAL LIGATION;  Surgeon: Toribio VEAR Ada, MD;  Location: [facility];  Service: Gynecology;  Laterality: Bilateral;  fallopian tubes caurtery   LAPAROSCOPY     removal of cyst   ROBOTIC ASSISTED TOTAL HYSTERECTOMY N/A 05/02/2013   Procedure: ROBOTIC ASSISTED TOTAL HYSTERECTOMY;  Surgeon: Dickie DELENA Carder, MD;  Location: [facility];  Service: Gynecology;  Laterality: N/A;   TONGUE SURGERY     TRANSANAL HEMORRHOIDAL DEARTERIALIZATION N/A 05/31/2013   Procedure: THD HEMORRHOIDAL LIGATION/PEXY;  Surgeon: Elspeth KYM Schultze, MD;  Location: [facility];  Service: General;  Laterality: N/A;   TUBAL LIGATION     UNILATERAL SALPINGECTOMY Right 05/02/2013   Procedure: UNILATERAL SALPINGECTOMY;  Surgeon: Dickie  DELENA Carder, MD;  Location: [facility];  Service: Gynecology;  Laterality: Right;   WISDOM TOOTH EXTRACTION      Social History   Socioeconomic History   Marital status: Single    Spouse name: Not on file   Number of children: 1   Years of education: Some College   Highest education level: Not on file  Occupational History   Occupation: [employer]  Tobacco Use   Smoking status: Never   Smokeless tobacco: Never  Vaping Use   Vaping status: Never Used  Substance and Sexual Activity   Alcohol use: Yes    Comment: occ   Drug use: No   Sexual activity: Not on file  Other Topics Concern   Not on file  Social  History Narrative   ** Merged History Encounter **       Lives at home with her son Caffeine use: none Right-handed   Social Drivers of Corporate investment banker Strain: Not on file  Food Insecurity: Low Risk  (11/06/2023)   Received from [facility]   Food Insecurity    Within the past 12 months, did the food you bought just not last and you didn't have money to get more?: No    Within the past 12 months, did you worry that your food would run out before you got money to buy more?: No  Transportation Needs: Low Risk  (11/06/2023)   Received from [facility]   Transportation Needs    Within the past 12 months, has a lack of transportation kept you from medical appointments or from doing things needed for daily living?: No  Physical Activity: Not on file  Stress: Not on file  Social Connections: Not on file  Intimate Partner Violence: Not on file    Family History  Problem Relation Age of Onset   Diabetes Mother    Hyperlipidemia Father    Cancer Maternal Grandfather    Hyperlipidemia Paternal Grandfather    Diabetes Maternal Grandmother    Anesthesia problems Neg Hx    Hypotension Neg Hx    Malignant hyperthermia Neg Hx    Pseudochol deficiency Neg Hx     Allergies  Allergen Reactions   Cephalexin  Hives   Latex Itching, Swelling and Rash   Keflex  [Cephalexin ] Hives    Review of Systems  All other systems reviewed and are negative.      Objective:   BP 100/70   Pulse 83   Ht 5' 4 (1.626 m)   Wt 171 lb 9.6 oz (77.8 kg)   LMP 04/25/2013   SpO2 97%   BMI 29.46 kg/m   Vitals:   02/06/24 1343  BP: 100/70  Pulse: 83  Height: 5' 4 (1.626 m)  Weight: 171 lb 9.6 oz (77.8 kg)  SpO2: 97%  BMI (Calculated): 29.44    Physical Exam Vitals and nursing note reviewed.  Constitutional:      Appearance: Normal appearance. She is obese.  HENT:     Head: Normocephalic and atraumatic.   Eyes:     Extraocular Movements:  Extraocular movements intact.     Conjunctiva/sclera: Conjunctivae normal.     Pupils: Pupils are equal, round, and reactive to light.    Cardiovascular:     Rate and Rhythm: Normal rate and regular rhythm.  Pulmonary:     Effort: Pulmonary effort is normal.     Breath sounds: Normal breath sounds.   Musculoskeletal:  General: Normal range of motion.     Cervical back: Normal range of motion.   Neurological:     General: No focal deficit present.     Mental Status: She is alert and oriented to person, place, and time. Mental status is at baseline.   Psychiatric:        Mood and Affect: Mood normal.        Behavior: Behavior normal.        Thought Content: Thought content normal.        Judgment: Judgment normal.      No results found for any visits on 02/06/24.  Recent Results (from the past 2160 hours)  CBC with Diff     Status: Abnormal   Collection Time: 11/16/23 11:27 AM  Result Value Ref Range   WBC 7.0 3.4 - 10.8 x10E3/uL   RBC 4.67 3.77 - 5.28 x10E6/uL    Comment: Polychromasia present   Hemoglobin 9.7 (L) 11.1 - 15.9 g/dL   Hematocrit 65.3 65.9 - 46.6 %   MCV 74 (L) 79 - 97 fL   MCH 20.8 (L) 26.6 - 33.0 pg   MCHC 28.0 (L) 31.5 - 35.7 g/dL   RDW 75.0 (H) 88.2 - 84.5 %   Platelets 400 150 - 450 x10E3/uL   Neutrophils 61 Not Estab. %   Lymphs 28 Not Estab. %   Monocytes 7 Not Estab. %   Eos 2 Not Estab. %   Basos 1 Not Estab. %   Neutrophils Absolute 4.3 1.4 - 7.0 x10E3/uL   Lymphocytes Absolute 2.0 0.7 - 3.1 x10E3/uL   Monocytes Absolute 0.5 0.1 - 0.9 x10E3/uL   EOS (ABSOLUTE) 0.1 0.0 - 0.4 x10E3/uL   Basophils Absolute 0.0 0.0 - 0.2 x10E3/uL   Immature Granulocytes 1 Not Estab. %   Immature Grans (Abs) 0.1 0.0 - 0.1 x10E3/uL   Hematology Comments: Note:     Comment: Verified by microscopic examination.  CBC with Diff     Status: Abnormal   Collection Time: 12/05/23 11:49 AM  Result Value Ref Range   WBC 7.3 3.4 - 10.8 x10E3/uL   RBC 5.21 3.77  - 5.28 x10E6/uL    Comment: Few tear drops. Target cells present. Rare nucleated RBC seen. Polychromasia present Few schistocytes.    Hemoglobin 11.0 (L) 11.1 - 15.9 g/dL   Hematocrit 64.2 65.9 - 46.6 %   MCV 69 (L) 79 - 97 fL   MCH 21.1 (L) 26.6 - 33.0 pg   MCHC 30.8 (L) 31.5 - 35.7 g/dL   RDW 76.4 (H) 88.2 - 84.5 %   Platelets 262 150 - 450 x10E3/uL    Comment: Platelet count verified by examination of peripheral blood smear.   Neutrophils 56 Not Estab. %    Comment: Occasional myelocyte seen on scanning.   Lymphs 35 Not Estab. %   Monocytes 6 Not Estab. %   Eos 2 Not Estab. %   Basos 1 Not Estab. %   Neutrophils Absolute 4.1 1.4 - 7.0 x10E3/uL   Lymphocytes Absolute 2.6 0.7 - 3.1 x10E3/uL   Monocytes Absolute 0.5 0.1 - 0.9 x10E3/uL   EOS (ABSOLUTE) 0.1 0.0 - 0.4 x10E3/uL   Basophils Absolute 0.0 0.0 - 0.2 x10E3/uL   Immature Granulocytes 0 Not Estab. %   Immature Grans (Abs) 0.0 0.0 - 0.1 x10E3/uL   Hematology Comments: Note:     Comment: Verified by microscopic examination.  Lipid panel     Status: None   Collection Time: 12/05/23  11:49 AM  Result Value Ref Range   Cholesterol, Total 154 100 - 199 mg/dL   Triglycerides 71 0 - 149 mg/dL   HDL 60 >60 mg/dL   VLDL Cholesterol Cal 14 5 - 40 mg/dL   LDL Chol Calc (NIH) 80 0 - 99 mg/dL   Chol/HDL Ratio 2.6 0.0 - 4.4 ratio    Comment:                                   T. Chol/HDL Ratio                                             Men  Women                               1/2 Avg.Risk  3.4    3.3                                   Avg.Risk  5.0    4.4                                2X Avg.Risk  9.6    7.1                                3X Avg.Risk 23.4   11.0   VITAMIN D  25 Hydroxy (Vit-D Deficiency, Fractures)     Status: Abnormal   Collection Time: 12/05/23 11:49 AM  Result Value Ref Range   Vit D, 25-Hydroxy 18.9 (L) 30.0 - 100.0 ng/mL    Comment: Vitamin D  deficiency has been defined by the Institute of Medicine and an  Endocrine Society practice guideline as a level of serum 25-OH vitamin D  less than 20 ng/mL (1,2). The Endocrine Society went on to further define vitamin D  insufficiency as a level between 21 and 29 ng/mL (2). 1. IOM (Institute of Medicine). 2010. Dietary reference    intakes for calcium  and D. [place]: The    [employer]. 2. Holick MF, Binkley [place], Bischoff-Ferrari HA, et al.    Evaluation, treatment, and prevention of vitamin D     deficiency: an Endocrine Society clinical practice    guideline. JCEM. 2011 Jul; 96(7):1911-30.   CMP14+EGFR     Status: None   Collection Time: 12/05/23 11:49 AM  Result Value Ref Range   Glucose 81 70 - 99 mg/dL   BUN 9 6 - 24 mg/dL   Creatinine, Ser 9.42 0.57 - 1.00 mg/dL   eGFR 884 >40 fO/fpw/8.26   BUN/Creatinine Ratio 16 9 - 23   Sodium 137 134 - 144 mmol/L   Potassium 4.2 3.5 - 5.2 mmol/L   Chloride 104 96 - 106 mmol/L   CO2 22 20 - 29 mmol/L   Calcium  9.3 8.7 - 10.2 mg/dL   Total Protein 6.3 6.0 - 8.5 g/dL   Albumin 4.0 3.9 - 4.9 g/dL   Globulin, Total 2.3 1.5 - 4.5 g/dL   Bilirubin Total 0.5 0.0 - 1.2 mg/dL   Alkaline Phosphatase 68 44 - 121  IU/L   AST 11 0 - 40 IU/L   ALT 11 0 - 32 IU/L  TSH     Status: None   Collection Time: 12/05/23 11:49 AM  Result Value Ref Range   TSH 1.860 0.450 - 4.500 uIU/mL  Hemoglobin A1c     Status: None   Collection Time: 12/05/23 11:49 AM  Result Value Ref Range   Hgb A1c MFr Bld 5.3 4.8 - 5.6 %    Comment:          Prediabetes: 5.7 - 6.4          Diabetes: >6.4          Glycemic control for adults with diabetes: <7.0    Est. average glucose Bld gHb Est-mCnc 105 mg/dL  Vitamin B12     Status: None   Collection Time: 12/05/23 11:49 AM  Result Value Ref Range   Vitamin B-12 615 232 - 1,245 pg/mL  POCT Glucose (CBG)     Status: Normal   Collection Time: 01/04/24  1:24 PM  Result Value Ref Range   POC Glucose 91 70 - 99 mg/dl  POCT Urinalysis Dipstick (81002)     Status: Normal    Collection Time: 01/04/24  2:24 PM  Result Value Ref Range   Color, UA yellow    Clarity, UA cloudy    Glucose, UA Negative Negative   Bilirubin, UA Negative    Ketones, UA Negative    Spec Grav, UA 1.015 1.010 - 1.025   Blood, UA Negative    pH, UA 7.0 5.0 - 8.0   Protein, UA Negative Negative   Urobilinogen, UA 0.2 0.2 or 1.0 E.U./dL   Nitrite, UA Negative    Leukocytes, UA Negative Negative   Appearance     Odor    Urinalysis, Routine w reflex microscopic     Status: None   Collection Time: 01/04/24  3:00 PM  Result Value Ref Range   Specific Gravity, UA 1.014 1.005 - 1.030   pH, UA 7.5 5.0 - 7.5   Color, UA Yellow Yellow   Appearance Ur Clear Clear   Leukocytes,UA Negative Negative   Protein,UA Negative Negative/Trace   Glucose, UA Negative Negative   Ketones, UA Negative Negative   RBC, UA Negative Negative   Bilirubin, UA Negative Negative   Urobilinogen, Ur 0.2 0.2 - 1.0 mg/dL   Nitrite, UA Negative Negative   Microscopic Examination Comment     Comment: Microscopic not indicated and not performed.  Urine Culture     Status: None   Collection Time: 01/04/24  3:00 PM   Specimen: Urine, Clean Catch   UR  Result Value Ref Range   Urine Culture, Routine Final report    Organism ID, Bacteria Comment     Comment: Mixed urogenital flora 10,000-25,000 colony forming units per mL        Assessment & Plan Hemorrhoids, internal, with bleeding s/p [facility] 05/31/2013 Sending referral to GI.  Patient is aware they will call to schedule.      Return in about 1 month (around 03/07/2024) for F/U.   Total time spent: 20 minutes  ALAN CHRISTELLA ARRANT, FNP  02/06/2024   This document may have been prepared by [facility] Voice Recognition software and as such may include unintentional dictation errors.

## 2024-02-12 ENCOUNTER — Encounter: Payer: Self-pay | Admitting: Family

## 2024-02-12 NOTE — Assessment & Plan Note (Signed)
 Sending referral to GI.  Patient is aware they will call to schedule.
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
## 2024-02-18 ENCOUNTER — Encounter: Payer: Self-pay | Admitting: Family

## 2024-02-28 ENCOUNTER — Telehealth: Payer: Self-pay | Admitting: Neurology

## 2024-02-28 ENCOUNTER — Encounter: Payer: Self-pay | Admitting: Oncology

## 2024-02-28 ENCOUNTER — Inpatient Hospital Stay: Payer: Medicaid Other | Attending: Oncology

## 2024-02-28 ENCOUNTER — Inpatient Hospital Stay: Payer: Medicaid Other | Admitting: Oncology

## 2024-02-28 DIAGNOSIS — R7989 Other specified abnormal findings of blood chemistry: Secondary | ICD-10-CM | POA: Insufficient documentation

## 2024-02-28 DIAGNOSIS — D563 Thalassemia minor: Secondary | ICD-10-CM | POA: Insufficient documentation

## 2024-02-28 DIAGNOSIS — R569 Unspecified convulsions: Secondary | ICD-10-CM | POA: Insufficient documentation

## 2024-02-28 DIAGNOSIS — D509 Iron deficiency anemia, unspecified: Secondary | ICD-10-CM | POA: Insufficient documentation

## 2024-02-28 DIAGNOSIS — R5383 Other fatigue: Secondary | ICD-10-CM | POA: Insufficient documentation

## 2024-02-28 DIAGNOSIS — R718 Other abnormality of red blood cells: Secondary | ICD-10-CM | POA: Insufficient documentation

## 2024-02-28 DIAGNOSIS — K649 Unspecified hemorrhoids: Secondary | ICD-10-CM | POA: Insufficient documentation

## 2024-02-28 DIAGNOSIS — Z79899 Other long term (current) drug therapy: Secondary | ICD-10-CM | POA: Insufficient documentation

## 2024-02-28 DIAGNOSIS — R112 Nausea with vomiting, unspecified: Secondary | ICD-10-CM | POA: Insufficient documentation

## 2024-02-28 DIAGNOSIS — M8580 Other specified disorders of bone density and structure, unspecified site: Secondary | ICD-10-CM | POA: Insufficient documentation

## 2024-02-28 NOTE — Telephone Encounter (Signed)
 Called pt at [phone] to try and get more information.   She refused to give me any information after asking several times. She only wants to speak directly with Dr. Ines. Aware she has appointment 04/04/24 with her but wants her to call her back once back in office this week. I did explain message will be sent and she will call once available.

## 2024-02-28 NOTE — Assessment & Plan Note (Deleted)
Recommend family member to be screened for thalassemia This explains her chronic microcytosis and contributes to chronic anemia. 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

## 2024-02-28 NOTE — Telephone Encounter (Signed)
 Pt is requesting call from MD the patient did not go into detail with me about what she wanted to tied to get PT to get pt explain the patient states its about  her seizures Just want a call back

## 2024-02-28 NOTE — Assessment & Plan Note (Deleted)
 Etiology unknown.  Could be secondary to previous iron  infusions, versus chronic inflammation versus hereditary hemochromatosis, effective erythrocytosis. Hemochromatosis DNA mutation testing is negative.  inflammatory markers-ANA/CRP/ESR were all within normal limits.  No liver cirrhosis on 01/28/23 ultrasound abdomen Ferritin level 1200s, continues to improve. She is not interested in iron  chelator.  Discussed about observation vs low volume phlebotomy.  Recommend to avoid alcohol use, iron  supplementation, vitamin C supplementation.

## 2024-02-29 ENCOUNTER — Telehealth: Payer: Self-pay | Admitting: *Deleted

## 2024-02-29 ENCOUNTER — Ambulatory Visit: Payer: Medicaid Other | Admitting: Oncology

## 2024-02-29 ENCOUNTER — Other Ambulatory Visit: Payer: Medicaid Other

## 2024-02-29 NOTE — Telephone Encounter (Signed)
 Lease clal patient and let her know I cannot call her, I can send her a mychart message for her to respond to thanks

## 2024-02-29 NOTE — Telephone Encounter (Signed)
 She called late in the day because her baby had seizure and they were in the hospital with him.  She would like to have another day and this week that she could come in.  Please call patient back with new appt for the patient.

## 2024-02-29 NOTE — Telephone Encounter (Signed)
 Spoke with patient. She is ok with getting a mychart message from Dr Ines. She is aware Dr Ines is out of the office and cannot call her.

## 2024-03-01 ENCOUNTER — Inpatient Hospital Stay (HOSPITAL_BASED_OUTPATIENT_CLINIC_OR_DEPARTMENT_OTHER): Admitting: Oncology

## 2024-03-01 ENCOUNTER — Ambulatory Visit (HOSPITAL_BASED_OUTPATIENT_CLINIC_OR_DEPARTMENT_OTHER): Payer: Self-pay | Admitting: Oncology

## 2024-03-01 ENCOUNTER — Other Ambulatory Visit: Payer: Self-pay

## 2024-03-01 ENCOUNTER — Other Ambulatory Visit: Payer: Self-pay | Admitting: Family

## 2024-03-01 ENCOUNTER — Encounter: Payer: Self-pay | Admitting: Oncology

## 2024-03-01 ENCOUNTER — Inpatient Hospital Stay

## 2024-03-01 ENCOUNTER — Other Ambulatory Visit: Payer: Self-pay | Admitting: Oncology

## 2024-03-01 VITALS — BP 106/62 | HR 85 | Temp 96.2°F | Resp 18

## 2024-03-01 DIAGNOSIS — D563 Thalassemia minor: Secondary | ICD-10-CM | POA: Diagnosis not present

## 2024-03-01 DIAGNOSIS — K649 Unspecified hemorrhoids: Secondary | ICD-10-CM | POA: Diagnosis not present

## 2024-03-01 DIAGNOSIS — R7989 Other specified abnormal findings of blood chemistry: Secondary | ICD-10-CM

## 2024-03-01 DIAGNOSIS — D509 Iron deficiency anemia, unspecified: Secondary | ICD-10-CM | POA: Diagnosis not present

## 2024-03-01 DIAGNOSIS — R569 Unspecified convulsions: Secondary | ICD-10-CM | POA: Diagnosis not present

## 2024-03-01 DIAGNOSIS — R718 Other abnormality of red blood cells: Secondary | ICD-10-CM | POA: Diagnosis not present

## 2024-03-01 DIAGNOSIS — D561 Beta thalassemia: Secondary | ICD-10-CM

## 2024-03-01 DIAGNOSIS — R5383 Other fatigue: Secondary | ICD-10-CM | POA: Diagnosis not present

## 2024-03-01 DIAGNOSIS — R112 Nausea with vomiting, unspecified: Secondary | ICD-10-CM | POA: Diagnosis not present

## 2024-03-01 DIAGNOSIS — M8580 Other specified disorders of bone density and structure, unspecified site: Secondary | ICD-10-CM | POA: Diagnosis not present

## 2024-03-01 DIAGNOSIS — Z79899 Other long term (current) drug therapy: Secondary | ICD-10-CM | POA: Diagnosis not present

## 2024-03-01 LAB — TECHNOLOGIST SMEAR REVIEW: Plt Morphology: NORMAL

## 2024-03-01 LAB — HEPATIC FUNCTION PANEL
ALT: 11 U/L (ref 0–44)
AST: 12 U/L — ABNORMAL LOW (ref 15–41)
Albumin: 3.5 g/dL (ref 3.5–5.0)
Alkaline Phosphatase: 50 U/L (ref 38–126)
Bilirubin, Direct: 0.1 mg/dL (ref 0.0–0.2)
Indirect Bilirubin: 0.8 mg/dL (ref 0.3–0.9)
Total Bilirubin: 0.9 mg/dL (ref 0.0–1.2)
Total Protein: 6.5 g/dL (ref 6.5–8.1)

## 2024-03-01 LAB — BASIC METABOLIC PANEL - CANCER CENTER ONLY
Anion gap: 8 (ref 5–15)
BUN: 13 mg/dL (ref 6–20)
CO2: 24 mmol/L (ref 22–32)
Calcium: 8.6 mg/dL — ABNORMAL LOW (ref 8.9–10.3)
Chloride: 108 mmol/L (ref 98–111)
Creatinine: 0.61 mg/dL (ref 0.44–1.00)
GFR, Estimated: >60 mL/min (ref >60)
Glucose, Bld: 94 mg/dL (ref 70–99)
Potassium: 3.9 mmol/L (ref 3.5–5.1)
Sodium: 140 mmol/L (ref 135–145)

## 2024-03-01 LAB — CBC WITH DIFFERENTIAL (CANCER CENTER ONLY)
Abs Immature Granulocytes: 0.01 K/uL (ref 0.00–0.07)
Basophils Absolute: 0 K/uL (ref 0.0–0.1)
Basophils Relative: 1 %
Eosinophils Absolute: 0.2 K/uL (ref 0.0–0.5)
Eosinophils Relative: 3 %
HCT: 29.4 % — ABNORMAL LOW (ref 36.0–46.0)
Hemoglobin: 8.6 g/dL — ABNORMAL LOW (ref 12.0–15.0)
Immature Granulocytes: 0 %
Lymphocytes Relative: 42 %
Lymphs Abs: 2.3 K/uL (ref 0.7–4.0)
MCH: 18.1 pg — ABNORMAL LOW (ref 26.0–34.0)
MCHC: 29.3 g/dL — ABNORMAL LOW (ref 30.0–36.0)
MCV: 62 fL — ABNORMAL LOW (ref 80.0–100.0)
Monocytes Absolute: 0.3 K/uL (ref 0.1–1.0)
Monocytes Relative: 6 %
Neutro Abs: 2.6 K/uL (ref 1.7–7.7)
Neutrophils Relative %: 48 %
Platelet Count: 233 K/uL (ref 150–400)
RBC: 4.74 MIL/uL (ref 3.87–5.11)
RDW: 17.3 % — ABNORMAL HIGH (ref 11.5–15.5)
WBC Count: 5.4 K/uL (ref 4.0–10.5)
nRBC: 0.4 % — ABNORMAL HIGH (ref 0.0–0.2)

## 2024-03-01 LAB — RETIC PANEL
Immature Retic Fract: 22.3 % — ABNORMAL HIGH (ref 2.3–15.9)
RBC.: 4.78 MIL/uL (ref 3.87–5.11)
Retic Count, Absolute: 163 K/uL (ref 19.0–186.0)
Retic Ct Pct: 3.4 % — ABNORMAL HIGH (ref 0.4–3.1)
Reticulocyte Hemoglobin: 17 pg — ABNORMAL LOW (ref >27.9)

## 2024-03-01 LAB — IRON AND TIBC
Iron: 79 ug/dL (ref 28–170)
Saturation Ratios: 30 % (ref 10.4–31.8)
TIBC: 265 ug/dL (ref 250–450)
UIBC: 186 ug/dL

## 2024-03-01 LAB — TSH: TSH: 1.607 u[IU]/mL (ref 0.350–4.500)

## 2024-03-01 LAB — FERRITIN: Ferritin: 1313 ng/mL — ABNORMAL HIGH (ref 11–307)

## 2024-03-01 NOTE — Assessment & Plan Note (Addendum)
 Etiology unknown.  Could be secondary to previous iron  infusions, chronic inflammation,  ineffective erythrocytosis. Hemochromatosis DNA mutation testing is negative.  inflammatory markers-ANA/CRP/ESR were all within normal limits.  No liver cirrhosis on 01/28/23 ultrasound abdomen Ferritin level 1300s, normal iron  saturation.  She is not interested in iron  chelator. High ferritin maybe due to reactive etiologies.  Discussed about observation.  Recommend to avoid alcohol use, iron  supplementation, vitamin C supplementation.

## 2024-03-01 NOTE — Progress Notes (Signed)
 Hematology/Oncology Progress note [phone] [phone]         Patient Care Team: Orlean Alan HERO, FNP as PCP - General (Family Medicine) Rutherford Gain, MD as PCP - OBGYN (Obstetrics and Gynecology) Timmy Maude SAUNDERS, MD as Consulting Physician (Hematology and Oncology) Rollin Dover, MD as Consulting Physician (Gastroenterology) Babara Call, MD as Consulting Physician (Oncology)   CHIEF COMPLAINTS/REASON FOR VISIT:  Anemia  ASSESSMENT & PLAN:   Beta thalassemia minor Recommend family member to be screened for thalassemia This explains her chronic microcytosis and contributes to chronic anemia. DNA sequencing for thalassemia testing showed c.332T>C (p.Leu111Pro), confirming Beta-Showa-Yakushiji thalassemia Observation.   Elevated ferritin Etiology unknown.  Could be secondary to previous iron  infusions, versus chronic inflammation versus hereditary hemochromatosis, effective erythrocytosis. Hemochromatosis DNA mutation testing is negative.  inflammatory markers-ANA/CRP/ESR were all within normal limits.  No liver cirrhosis on 01/28/23 ultrasound abdomen Ferritin level 1300s, continues to improve. She is not interested in iron  chelator.  Discussed about observation.  Recommend to avoid alcohol use, iron  supplementation, vitamin C supplementation.  Microcytic anemia Anemia could be secondary to beta thalassemia. Monitor.  Hemoglobin has further decreased to 8.6 compared to her baseline between 9-10. Normal TSH, bilirubin level.  Previous anemia workup negative for protein electrophoresis, normal ANA, CRP, ESR.  Normal B12 and folate. Recommend short-term follow-up with repeat CBC 4 weeks.  If persistently low, recommend bone marrow biopsy for further evaluation.  Orders Placed This Encounter  Procedures   Basic Metabolic Panel - Cancer Center Only    Standing Status:   Future    Number of Occurrences:   1    Expected Date:   03/01/2024     Expiration Date:   05/30/2024   Retic Panel    Standing Status:   Future    Number of Occurrences:   1    Expected Date:   03/01/2024    Expiration Date:   05/30/2024   TSH    Standing Status:   Future    Number of Occurrences:   1    Expected Date:   03/01/2024    Expiration Date:   05/30/2024   Technologist smear review    Standing Status:   Future    Number of Occurrences:   1    Expected Date:   03/01/2024    Expiration Date:   05/30/2024    Clinical information::   microcytic anemia   Follow-up in 6 months All questions were answered. The patient knows to call the clinic with any problems, questions or concerns.  Call Babara, MD, PhD [facility] Hematology Oncology 03/01/2024     HISTORY OF PRESENTING ILLNESS:  Abigail Hall is a  43 y.o.  female with PMH listed below who was referred to me for anemia Review previous medical records with hematologist Dr. Timmy Patient has a longstanding history of chronic anemia, macrocytic.  She has a history of iron  deficiency anemia in 2013/2014, urine for pregnancy.  She was treated with IV Feraheme  treatments. She continues to receive IV iron  treatment despite improved iron  saturation and ferritin level, that was considered to be secondary to inflammation. Patient has a history of beta thalassemia minor. Patient wants to transfer her care to our facility. She reports feeling tired. Denies hematochezia, hematuria, hematemesis, epistaxis, black tarry stool or easy bruising.  She has hysterectomy She denies any autoimmune disease.  Patient denies being on any antiplatelet or anticoagulation agents. Patient has history of seizure for which she has been on  level.  She follows up with neurology Dr.Ahern.    INTERVAL HISTORY Abigail Hall is a 43 y.o. female who has above history reviewed by me today presents for follow up visit for beta thalassemia minor and iron  overload due to previous iron  infusion.   11/06/2023 - 11/08/2023  patient was admitted to [facility] due to breakthrough seizure without MAC.  Depakote  level on admission was 37.  Patient was seen by neurologist and was recommended to increase Depakote  to 75 mg twice daily. Patient had abdominal skin reduction surgery prior to the accident and developed abdominal wound dehiscence.  Patient was seen by plastic surgeon.  Patient was found to have hemoglobin of 6.4 in the emergency room.  This was felt to be bone loss from abdominal wound diseases.  Patient was transfused 3 units of PRBC during her admission.  Today patient reports feeling well at baseline.  Abdomen wound has healed well.  No active bleeding events currently. 02/06/2024, patient was seen by primary care provider and mentioned several episodes of hemorrhoid bleeding.  MEDICAL HISTORY:  Past Medical History:  Diagnosis Date   Anemia    Anemia, iron  deficiency 04/10/2012   Appendicitis 06/04/2020   Constipation    Depression 04/26/2013   history - no current problems   Generalized headaches    Hemorrhoids    History of blood transfusion 02/2013   [facility]  2 units transfused    History of laparoscopic partial gastrectomy 03/01/2017   Medication exposure during first trimester of pregnancy 07/15/2011   No issues at this time     Nausea & vomiting    resolved after delivery - C/S, extreme   Normocytic anemia 12/13/2011   Monitoring closely and hem/onc is involved     Rectal bleeding    Rectal pain    Seasonal allergies    Seizure (HCC)    Seizures (HCC)    last seizure was  08/2012   Thalassemia    Weakness    resolved after C/S delivery    SURGICAL HISTORY: Past Surgical History:  Procedure Laterality Date   ABDOMINAL HYSTERECTOMY     CARPAL TUNNEL RELEASE  01/2009   right   CESAREAN SECTION  12/31/2011   Procedure: CESAREAN SECTION;  Surgeon: Toribio VEAR Ada, MD;  Location: [facility];  Service: Gynecology;  Laterality: N/A;   DILATION AND CURETTAGE OF UTERUS     endometrial  ablation   EVALUATION UNDER ANESTHESIA WITH HEMORRHOIDECTOMY N/A 05/31/2013   Procedure: EXAM UNDER ANESTHESIA WITH EXTERNAL HEMORRHOIDECTOMY;  Surgeon: Elspeth KYM Schultze, MD;  Location: [facility];  Service: General;  Laterality: N/A;   LABIOPLASTY  03/27/2012   Procedure: LABIAPLASTY;  Surgeon: Toribio VEAR Ada, MD;  Location: [facility];  Service: Gynecology;  Laterality: N/A;  labia   LAPAROSCOPIC APPENDECTOMY N/A 06/05/2020   Procedure: APPENDECTOMY LAPAROSCOPIC;  Surgeon: Kinsinger, Herlene Righter, MD;  Location: [facility];  Service: General;  Laterality: N/A;   LAPAROSCOPIC TUBAL LIGATION  03/27/2012   Procedure: LAPAROSCOPIC TUBAL LIGATION;  Surgeon: Toribio VEAR Ada, MD;  Location: [facility];  Service: Gynecology;  Laterality: Bilateral;  fallopian tubes caurtery   LAPAROSCOPY     removal of cyst   ROBOTIC ASSISTED TOTAL HYSTERECTOMY N/A 05/02/2013   Procedure: ROBOTIC ASSISTED TOTAL HYSTERECTOMY;  Surgeon: Dickie DELENA Carder, MD;  Location: [facility];  Service: Gynecology;  Laterality: N/A;   TONGUE SURGERY     TRANSANAL HEMORRHOIDAL DEARTERIALIZATION N/A 05/31/2013   Procedure: THD HEMORRHOIDAL LIGATION/PEXY;  Surgeon: Elspeth KYM Schultze,  MD;  Location: [facility];  Service: General;  Laterality: N/A;   TUBAL LIGATION     UNILATERAL SALPINGECTOMY Right 05/02/2013   Procedure: UNILATERAL SALPINGECTOMY;  Surgeon: Dickie DELENA Carder, MD;  Location: [facility];  Service: Gynecology;  Laterality: Right;   WISDOM TOOTH EXTRACTION      SOCIAL HISTORY: Social History   Socioeconomic History   Marital status: Single    Spouse name: Not on file   Number of children: 1   Years of education: Some College   Highest education level: Not on file  Occupational History   Occupation: BOA  Tobacco Use   Smoking status: Never   Smokeless tobacco: Never  Vaping Use   Vaping status: Never Used  Substance and Sexual Activity   Alcohol use: Yes    Comment: occ   Drug use: No   Sexual activity: Not on file  Other Topics  Concern   Not on file  Social History Narrative   ** Merged History Encounter **       Lives at home with her son Caffeine use: none Right-handed   Social Drivers of Corporate investment banker Strain: Not on file  Food Insecurity: Low Risk  (11/06/2023)   Received from [facility]   Food Insecurity    Within the past 12 months, did the food you bought just not last and you didn't have money to get more?: No    Within the past 12 months, did you worry that your food would run out before you got money to buy more?: No  Transportation Needs: Low Risk  (11/06/2023)   Received from [facility]   Transportation Needs    Within the past 12 months, has a lack of transportation kept you from medical appointments or from doing things needed for daily living?: No  Physical Activity: Not on file  Stress: Not on file  Social Connections: Not on file  Intimate Partner Violence: Not on file    FAMILY HISTORY: Family History  Problem Relation Age of Onset   Diabetes Mother    Hyperlipidemia Father    Cancer Maternal Grandfather    Hyperlipidemia Paternal Grandfather    Diabetes Maternal Grandmother    Anesthesia problems Neg Hx    Hypotension Neg Hx    Malignant hyperthermia Neg Hx    Pseudochol deficiency Neg Hx     ALLERGIES:  is allergic to cephalexin , latex, and keflex  [cephalexin ].  MEDICATIONS:  Current Outpatient Medications  Medication Sig Dispense Refill   acetaminophen  (TYLENOL ) 500 MG tablet Take 2 tablets (1,000 mg total) by mouth every 6 (six) hours as needed. 30 tablet 0   Biotin 89999 MCG TBDP Take 10,000 mcg by mouth daily.     divalproex  (DEPAKOTE  ER) 250 MG 24 hr tablet Take 4 tablets (1,000 mg total) by mouth daily. 360 tablet 3   Fluocinolone  Acetonide Scalp 0.01 % OIL Apply 1 application. topically daily as needed. APPLY EXTERNALLY TO THE AFFECTED AREA DAILY AS NEEDED Strength: 0.01% 118.28 mL 11   fluticasone  (FLONASE ) 50 MCG/ACT  nasal spray Place 1 spray into both nostrils daily. 16 mL 2   hydrocortisone  2.5%-nystatin-zinc  oxide 20% 1:1:1 ointment mixture Apply topically 2 (two) times daily. To affected areas 240 g 3   ketoconazole  (NIZORAL ) 2 % shampoo APPLY EXTERNALLY 2 TIMES A WEEK 120 mL 11   linaclotide  (LINZESS ) 290 MCG CAPS capsule Take 1 capsule (290 mcg total) by mouth daily before breakfast. 30 capsule  3   lisdexamfetamine (VYVANSE ) 30 MG capsule Take 1 capsule (30 mg total) by mouth daily. 30 capsule 0   mupirocin  ointment (BACTROBAN ) 2 % Apply 1 Application topically 2 (two) times daily. 88 g 0   OZEMPIC , 2 MG/DOSE, 8 MG/3ML SOPN DIAL AND INJECT UNDER THE SKIN 2 MG WEEKLY 3 mL 3   polyethylene glycol powder (GLYCOLAX /MIRALAX ) 17 GM/SCOOP powder Take 1 Container by mouth daily as needed for mild constipation. 1 scoop as needed     tobramycin -dexamethasone  (TOBRADEX ) ophthalmic solution Place 1 drop into the right eye every 4 (four) hours while awake. 5 mL 0   No current facility-administered medications for this visit.    Review of Systems  Constitutional:  Positive for fatigue. Negative for appetite change, chills and fever.  HENT:   Negative for hearing loss and voice change.   Eyes:  Negative for eye problems.  Respiratory:  Negative for chest tightness and cough.   Cardiovascular:  Negative for chest pain.  Gastrointestinal:  Negative for abdominal distention, abdominal pain and blood in stool.  Endocrine: Negative for hot flashes.  Genitourinary:  Negative for difficulty urinating and frequency.   Musculoskeletal:  Negative for arthralgias.  Skin:  Negative for itching and rash.  Neurological:  Negative for extremity weakness.  Hematological:  Negative for adenopathy.  Psychiatric/Behavioral:  Negative for confusion.     PHYSICAL EXAMINATION: Vitals:   03/01/24 1031  BP: 106/62  Pulse: 85  Resp: 18  Temp: (!) 96.2 F (35.7 C)  SpO2: 100%   There were no vitals filed for this  visit.   Physical Exam Constitutional:      General: She is not in acute distress. HENT:     Head: Normocephalic and atraumatic.  Eyes:     General: No scleral icterus. Cardiovascular:     Rate and Rhythm: Normal rate.  Pulmonary:     Effort: Pulmonary effort is normal. No respiratory distress.  Abdominal:     General: There is no distension.  Musculoskeletal:        General: No deformity. Normal range of motion.     Cervical back: Normal range of motion and neck supple.  Skin:    Findings: No erythema or rash.  Neurological:     Mental Status: She is alert and oriented to person, place, and time. Mental status is at baseline.     Cranial Nerves: No cranial nerve deficit.     Coordination: Coordination normal.  Psychiatric:        Mood and Affect: Mood normal.      LABORATORY DATA:  I have reviewed the data as listed    Latest Ref Rng & Units 03/01/2024   10:17 AM 12/05/2023   11:49 AM 11/16/2023   11:27 AM  CBC  WBC 4.0 - 10.5 K/uL 5.4  7.3  7.0   Hemoglobin 12.0 - 15.0 g/dL 8.6  88.9  9.7   Hematocrit 36.0 - 46.0 % 29.4  35.7  34.6   Platelets 150 - 400 K/uL 233  262  400       Latest Ref Rng & Units 03/01/2024   10:17 AM 12/05/2023   11:49 AM 08/31/2023   11:02 AM  CMP  Glucose 70 - 99 mg/dL 94  81    BUN 6 - 20 mg/dL 13  9    Creatinine 9.55 - 1.00 mg/dL 9.38  9.42    Sodium 864 - 145 mmol/L 140  137    Potassium 3.5 -  5.1 mmol/L 3.9  4.2    Chloride 98 - 111 mmol/L 108  104    CO2 22 - 32 mmol/L 24  22    Calcium  8.9 - 10.3 mg/dL 8.6  9.3    Total Protein 6.5 - 8.1 g/dL 6.5  6.3  6.5   Total Bilirubin 0.0 - 1.2 mg/dL 0.9  0.5  0.8   Alkaline Phos 38 - 126 U/L 50  68  50   AST 15 - 41 U/L 12  11  13    ALT 0 - 44 U/L 11  11  11     Lab Results  Component Value Date   IRON  79 03/01/2024   TIBC 265 03/01/2024   IRONPCTSAT 30 03/01/2024   FERRITIN 1,313 (H) 03/01/2024     RADIOGRAPHIC STUDIES: I have personally reviewed the radiological images as  listed and agreed with the findings in the report. No results found.

## 2024-03-01 NOTE — Assessment & Plan Note (Addendum)
 Anemia could be secondary to beta thalassemia. Monitor.  Hemoglobin has further decreased to 8.6 compared to her baseline between 9-10. Normal TSH, bilirubin level.  Previous anemia workup negative for protein electrophoresis, normal ANA, CRP, ESR.  Normal B12 and folate. Recommend short-term follow-up with repeat CBC 4 weeks.  If persistently low, recommend bone marrow biopsy for further evaluation.

## 2024-03-01 NOTE — Assessment & Plan Note (Signed)
 Recommend family member to be screened for thalassemia This explains her chronic microcytosis and contributes to chronic anemia. DNA sequencing for thalassemia testing showed c.332T>C (p.Leu111Pro), confirming Beta-Showa-Yakushiji thalassemia Observation.

## 2024-03-01 NOTE — Addendum Note (Signed)
 Addended by: BABARA CALL on: 03/01/2024 10:13 PM   Modules accepted: Orders

## 2024-03-02 ENCOUNTER — Encounter: Payer: Self-pay | Admitting: Family

## 2024-03-02 NOTE — Telephone Encounter (Signed)
 Attempted to call to reply Dr.Yu's message. No answer left voicemail & will send mychart

## 2024-03-02 NOTE — Telephone Encounter (Signed)
-----   Message from Zelphia Cap sent at 03/01/2024  9:35 PM EDT ----- Please let patient know that additional testing results did not explain her low hemoglobin count.  Recommend short-term follow-up lab in 4 weeks. Labs reordered. ----- Message ----- From: Rebecka, Lab [facility] [place] Sent: 03/01/2024  11:27 AM EDT To: Zelphia Cap, MD

## 2024-03-04 ENCOUNTER — Encounter: Payer: Self-pay | Admitting: Family

## 2024-03-04 ENCOUNTER — Other Ambulatory Visit: Payer: Self-pay | Admitting: Family

## 2024-03-04 NOTE — Assessment & Plan Note (Signed)
 Checking labs today.  Continue current therapy for lipid control. Will modify as needed based on labwork results.   -CMP w/eGFR -Lipid Panel

## 2024-03-04 NOTE — Assessment & Plan Note (Signed)
 Checking labs today. Will call pt. With results  Continue current diabetes POC, as patient has been well controlled on current regimen.  Will adjust meds if needed based on labs.   -CBC w/Diff -CMP w/eGFR -Hemoglobin A1C

## 2024-03-05 ENCOUNTER — Encounter: Payer: Self-pay | Admitting: Neurology

## 2024-03-06 ENCOUNTER — Other Ambulatory Visit: Payer: Self-pay

## 2024-03-06 MED ORDER — OZEMPIC (2 MG/DOSE) 8 MG/3ML ~~LOC~~ SOPN: 2.0000 mg | PEN_INJECTOR | SUBCUTANEOUS | 3 refills | Status: DC

## 2024-03-07 ENCOUNTER — Encounter: Payer: Self-pay | Admitting: Family

## 2024-03-07 ENCOUNTER — Ambulatory Visit: Admitting: Family

## 2024-03-18 NOTE — Assessment & Plan Note (Signed)
 Checking labs today. Will call pt. With results  Continue current diabetes POC, as patient has been well controlled on current regimen.  Will adjust meds if needed based on labs.   -CBC w/Diff -CMP w/eGFR -Hemoglobin A1C

## 2024-03-28 ENCOUNTER — Ambulatory Visit: Admitting: Family

## 2024-03-28 ENCOUNTER — Encounter: Payer: Self-pay | Admitting: Family

## 2024-03-28 DIAGNOSIS — J069 Acute upper respiratory infection, unspecified: Secondary | ICD-10-CM | POA: Diagnosis not present

## 2024-03-28 LAB — POCT XPERT XPRESS SARS COVID-2/FLU/RSV
FLU A: NEGATIVE
FLU B: NEGATIVE
RSV RNA, PCR: NEGATIVE
SARS Coronavirus 2: POSITIVE

## 2024-03-28 MED ORDER — PAXLOVID (300/100) 20 X 150 MG & 10 X 100MG PO TBPK: ORAL_TABLET | ORAL | 0 refills | Status: DC

## 2024-03-28 NOTE — Telephone Encounter (Signed)
 Can we get a covid test pls

## 2024-04-02 ENCOUNTER — Other Ambulatory Visit

## 2024-04-02 ENCOUNTER — Inpatient Hospital Stay

## 2024-04-02 ENCOUNTER — Inpatient Hospital Stay: Attending: Oncology

## 2024-04-02 DIAGNOSIS — R7989 Other specified abnormal findings of blood chemistry: Secondary | ICD-10-CM | POA: Diagnosis present

## 2024-04-02 DIAGNOSIS — D563 Thalassemia minor: Secondary | ICD-10-CM | POA: Diagnosis present

## 2024-04-02 DIAGNOSIS — D509 Iron deficiency anemia, unspecified: Secondary | ICD-10-CM

## 2024-04-02 LAB — CBC WITH DIFFERENTIAL/PLATELET
Abs Immature Granulocytes: 0.02 K/uL (ref 0.00–0.07)
Basophils Absolute: 0 K/uL (ref 0.0–0.1)
Basophils Relative: 0 %
Eosinophils Absolute: 0.1 K/uL (ref 0.0–0.5)
Eosinophils Relative: 2 %
HCT: 29.2 % — ABNORMAL LOW (ref 36.0–46.0)
Hemoglobin: 8.8 g/dL — ABNORMAL LOW (ref 12.0–15.0)
Immature Granulocytes: 0 %
Lymphocytes Relative: 55 %
Lymphs Abs: 2.6 K/uL (ref 0.7–4.0)
MCH: 18.6 pg — ABNORMAL LOW (ref 26.0–34.0)
MCHC: 30.1 g/dL (ref 30.0–36.0)
MCV: 61.7 fL — ABNORMAL LOW (ref 80.0–100.0)
Monocytes Absolute: 0.4 K/uL (ref 0.1–1.0)
Monocytes Relative: 8 %
Neutro Abs: 1.7 K/uL (ref 1.7–7.7)
Neutrophils Relative %: 35 %
Platelets: 222 K/uL (ref 150–400)
RBC: 4.73 MIL/uL (ref 3.87–5.11)
RDW: 15.1 % (ref 11.5–15.5)
WBC: 4.8 K/uL (ref 4.0–10.5)
nRBC: 0.4 % — ABNORMAL HIGH (ref 0.0–0.2)

## 2024-04-02 LAB — C-REACTIVE PROTEIN: CRP: <0.5 mg/dL (ref <1.0)

## 2024-04-02 LAB — RETIC PANEL
Immature Retic Fract: 10.3 % (ref 2.3–15.9)
RBC.: 4.68 MIL/uL (ref 3.87–5.11)
Retic Count, Absolute: 68.8 K/uL (ref 19.0–186.0)
Retic Ct Pct: 1.5 % (ref 0.4–3.1)
Reticulocyte Hemoglobin: 16.2 pg — ABNORMAL LOW (ref >27.9)

## 2024-04-02 LAB — FOLATE: Folate: 3.4 ng/mL — ABNORMAL LOW (ref >5.9)

## 2024-04-04 ENCOUNTER — Encounter: Payer: Self-pay | Admitting: Neurology

## 2024-04-04 ENCOUNTER — Ambulatory Visit: Payer: Medicaid Other | Admitting: Neurology

## 2024-04-04 ENCOUNTER — Ambulatory Visit: Payer: Medicaid Other | Admitting: Family Medicine

## 2024-04-04 VITALS — BP 109/69 | HR 67 | Wt 175.0 lb

## 2024-04-04 DIAGNOSIS — G40309 Generalized idiopathic epilepsy and epileptic syndromes, not intractable, without status epilepticus: Secondary | ICD-10-CM

## 2024-04-04 LAB — HUMAN PARVOVIRUS DNA DETECTION BY PCR: Parvovirus B19, PCR: NEGATIVE

## 2024-04-04 NOTE — Progress Notes (Addendum)
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 daily, she was continued on Depakote  500 mg DR twice daily.

## 2024-04-05 ENCOUNTER — Ambulatory Visit: Payer: Self-pay | Admitting: Neurology

## 2024-04-05 DIAGNOSIS — G40309 Generalized idiopathic epilepsy and epileptic syndromes, not intractable, without status epilepticus: Secondary | ICD-10-CM | POA: Insufficient documentation

## 2024-04-05 LAB — CBC WITH DIFFERENTIAL/PLATELET
Basophils Absolute: 0 x10E3/uL (ref 0.0–0.2)
Basos: 0 %
EOS (ABSOLUTE): 0.1 x10E3/uL (ref 0.0–0.4)
Eos: 2 %
Hematocrit: 32.7 % — ABNORMAL LOW (ref 34.0–46.6)
Hemoglobin: 9.2 g/dL — ABNORMAL LOW (ref 11.1–15.9)
Immature Grans (Abs): 0 x10E3/uL (ref 0.0–0.1)
Immature Granulocytes: 0 %
Lymphocytes Absolute: 3.4 x10E3/uL — ABNORMAL HIGH (ref 0.7–3.1)
Lymphs: 61 %
MCH: 18.4 pg — ABNORMAL LOW (ref 26.6–33.0)
MCHC: 28.1 g/dL — ABNORMAL LOW (ref 31.5–35.7)
MCV: 65 fL — ABNORMAL LOW (ref 79–97)
Monocytes Absolute: 0.3 x10E3/uL (ref 0.1–0.9)
Monocytes: 6 %
NRBC: 1 % — ABNORMAL HIGH (ref 0–0)
Neutrophils Absolute: 1.7 x10E3/uL (ref 1.4–7.0)
Neutrophils: 30 %
Platelets: 296 x10E3/uL (ref 150–450)
RBC: 5.01 x10E6/uL (ref 3.77–5.28)
RDW: 16.1 % — ABNORMAL HIGH (ref 11.7–15.4)
WBC: 5.5 x10E3/uL (ref 3.4–10.8)

## 2024-04-05 LAB — COMPREHENSIVE METABOLIC PANEL WITH GFR
ALT: 10 IU/L (ref 0–32)
AST: 11 IU/L (ref 0–40)
Albumin: 3.9 g/dL (ref 3.9–4.9)
Alkaline Phosphatase: 61 IU/L (ref 44–121)
BUN/Creatinine Ratio: 15 (ref 9–23)
BUN: 10 mg/dL (ref 6–24)
Bilirubin Total: 0.4 mg/dL (ref 0.0–1.2)
CO2: 24 mmol/L (ref 20–29)
Calcium: 9.1 mg/dL (ref 8.7–10.2)
Chloride: 103 mmol/L (ref 96–106)
Creatinine, Ser: 0.66 mg/dL (ref 0.57–1.00)
Globulin, Total: 2.4 g/dL (ref 1.5–4.5)
Glucose: 77 mg/dL (ref 70–99)
Potassium: 3.8 mmol/L (ref 3.5–5.2)
Sodium: 139 mmol/L (ref 134–144)
Total Protein: 6.3 g/dL (ref 6.0–8.5)
eGFR: 111 mL/min/1.73 (ref >59)

## 2024-04-05 LAB — VALPROIC ACID LEVEL: Valproic Acid Lvl: 59 ug/mL (ref 50–100)

## 2024-04-09 ENCOUNTER — Other Ambulatory Visit: Payer: Self-pay | Admitting: Neurology

## 2024-04-09 ENCOUNTER — Telehealth: Payer: Self-pay | Admitting: *Deleted

## 2024-04-09 DIAGNOSIS — G40309 Generalized idiopathic epilepsy and epileptic syndromes, not intractable, without status epilepticus: Secondary | ICD-10-CM

## 2024-04-09 MED ORDER — DIVALPROEX SODIUM ER 250 MG PO TB24: 1000.0000 mg | ORAL_TABLET | Freq: Every day | ORAL | 3 refills | Status: DC

## 2024-04-09 NOTE — Telephone Encounter (Signed)
 LVM for patient to call back to discuss depokote tablets that came from [facility] today

## 2024-04-10 NOTE — Telephone Encounter (Signed)
 Spoke to patient will come by Thursday to pick up Depokote 90 day supply

## 2024-04-12 NOTE — Telephone Encounter (Signed)
Medication picked up

## 2024-04-15 NOTE — Progress Notes (Signed)
   Subjective   CHIEF COMPLAINT  COVID Testing    REASON FOR VISIT  COVID testing for URI symptoms.        Objective   Results for orders placed or performed in visit on 03/28/24  POCT XPERT XPRESS SARS COVID-2/FLU/RSV  Result Value Ref Range   SARS Coronavirus 2 Positive    FLU A Negative    FLU B Negative    RSV RNA, PCR Negative     Assessment & Plan  1. Acute upper respiratory infection (Primary) Covid/Flu/RSV test run in office today.  Will call with results when available.   - POCT XPERT XPRESS SARS COVID-2/FLU/RSV   Total time spent: 5 minutes  ALAN CHRISTELLA ARRANT, FNP 03/28/2024

## 2024-04-17 ENCOUNTER — Ambulatory Visit: Payer: Self-pay

## 2024-04-20 ENCOUNTER — Encounter: Payer: Self-pay | Admitting: Family

## 2024-04-25 ENCOUNTER — Encounter: Payer: Self-pay | Admitting: Family

## 2024-05-09 ENCOUNTER — Ambulatory Visit: Admitting: Family

## 2024-05-09 ENCOUNTER — Encounter: Payer: Self-pay | Admitting: Family

## 2024-05-09 VITALS — BP 122/80 | HR 81 | Ht 64.0 in | Wt 174.8 lb

## 2024-05-09 DIAGNOSIS — G40309 Generalized idiopathic epilepsy and epileptic syndromes, not intractable, without status epilepticus: Secondary | ICD-10-CM

## 2024-05-09 DIAGNOSIS — R300 Dysuria: Secondary | ICD-10-CM

## 2024-05-09 DIAGNOSIS — D561 Beta thalassemia: Secondary | ICD-10-CM

## 2024-05-09 DIAGNOSIS — E1165 Type 2 diabetes mellitus with hyperglycemia: Secondary | ICD-10-CM | POA: Diagnosis not present

## 2024-05-09 DIAGNOSIS — E559 Vitamin D deficiency, unspecified: Secondary | ICD-10-CM

## 2024-05-09 DIAGNOSIS — N760 Acute vaginitis: Secondary | ICD-10-CM

## 2024-05-09 DIAGNOSIS — R5383 Other fatigue: Secondary | ICD-10-CM

## 2024-05-09 DIAGNOSIS — E782 Mixed hyperlipidemia: Secondary | ICD-10-CM | POA: Diagnosis not present

## 2024-05-09 DIAGNOSIS — Z0130 Encounter for examination of blood pressure without abnormal findings: Secondary | ICD-10-CM

## 2024-05-09 DIAGNOSIS — E538 Deficiency of other specified B group vitamins: Secondary | ICD-10-CM

## 2024-05-09 DIAGNOSIS — E860 Dehydration: Secondary | ICD-10-CM | POA: Diagnosis not present

## 2024-05-09 LAB — POCT URINALYSIS DIPSTICK
Bilirubin, UA: NEGATIVE
Blood, UA: NEGATIVE
Glucose, UA: NEGATIVE
Ketones, UA: NEGATIVE
Leukocytes, UA: NEGATIVE
Nitrite, UA: NEGATIVE
Protein, UA: POSITIVE — AB
Spec Grav, UA: 1.015 (ref 1.010–1.025)
Urobilinogen, UA: 0.2 U/dL
pH, UA: 7 (ref 5.0–8.0)

## 2024-05-09 MED ORDER — FLUCONAZOLE 150 MG PO TABS: 150.0000 mg | ORAL_TABLET | Freq: Every day | ORAL | 0 refills | Status: AC

## 2024-05-09 MED ORDER — SODIUM CHLORIDE 0.9 % IV BOLUS
1000.0000 mL | Freq: Once | INTRAVENOUS | Status: AC
  Administered 2024-05-09: 1000 mL via INTRAVENOUS

## 2024-05-09 NOTE — Assessment & Plan Note (Addendum)
-   UA and vaginal swab collected; diflucan  150 mg prescribed

## 2024-05-09 NOTE — Assessment & Plan Note (Addendum)
 Checking labs today.  Will continue supplements as needed.   - Vitamin D  - Vitamin B12 - TSH

## 2024-05-09 NOTE — Assessment & Plan Note (Addendum)
-   lab work collected today - Saline fluid bolus 1,000 ml given in office today

## 2024-05-09 NOTE — Assessment & Plan Note (Addendum)
 Checking labs today.  Continue current therapy for lipid control. Will modify as needed based on labwork results.   -CMP w/eGFR -Lipid Panel

## 2024-05-09 NOTE — Progress Notes (Signed)
 Established Patient Office Visit  Subjective:  Patient ID: Abigail Hall, female    DOB: 1980-09-26  Age: 43 y.o. MRN: [id number]  Chief Complaint  Patient presents with   Follow-up    2 month follow up    Patient is here today for her 2 months follow up.  She has been feeling fairly well since last appointment.   She does have additional concerns to discuss today including increased stress, fatigue, and recent covid infection the first of September, vomiting 3 different days last week, increased urinary frequency that has a strong odor, and increased white vaginal discharge. She thinks she may have yeast infection. Labs are not due today; completed 03/2024.    I have reviewed her active problem list, medication list, allergies, family history, social history, health maintenance, notes from last encounter, lab results for her appointment today.  Will collect UA to rule out UTI and collect vaginal swab to check for yeast infection verses BV; give IV fluids today for dehydration    No other concerns at this time.   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   Medication exposure during first trimester of pregnancy 07/15/2011   No issues at this time     Nausea & vomiting    resolved after delivery - C/S, extreme   Normocytic anemia 12/13/2011   Monitoring closely and hem/onc is involved     Rectal bleeding    Rectal pain    Seasonal allergies    Seizure (HCC)    Seizures (HCC)    last seizure was  08/2012   Thalassemia    Weakness    resolved after C/S delivery    Past Surgical History:  Procedure Laterality Date   ABDOMINAL HYSTERECTOMY     CARPAL  TUNNEL RELEASE  01/2009   right   CESAREAN SECTION  12/31/2011   Procedure: CESAREAN SECTION;  Surgeon: Toribio VEAR Ada, MD;  Location: [facility];  Service: Gynecology;  Laterality: N/A;   DILATION AND CURETTAGE OF UTERUS     endometrial ablation   EVALUATION UNDER ANESTHESIA WITH HEMORRHOIDECTOMY N/A 05/31/2013   Procedure: EXAM UNDER ANESTHESIA WITH EXTERNAL HEMORRHOIDECTOMY;  Surgeon: Elspeth KYM Schultze, MD;  Location: [facility];  Service: General;  Laterality: N/A;   LABIOPLASTY  03/27/2012   Procedure: LABIAPLASTY;  Surgeon: Toribio VEAR Ada, MD;  Location: [facility];  Service: Gynecology;  Laterality: N/A;  labia   LAPAROSCOPIC APPENDECTOMY N/A 06/05/2020   Procedure: APPENDECTOMY LAPAROSCOPIC;  Surgeon: Kinsinger, Herlene Righter, MD;  Location: [facility];  Service: General;  Laterality: N/A;   LAPAROSCOPIC TUBAL LIGATION  03/27/2012   Procedure: LAPAROSCOPIC TUBAL LIGATION;  Surgeon: Toribio VEAR Ada, MD;  Location: [facility];  Service: Gynecology;  Laterality: Bilateral;  fallopian tubes caurtery   LAPAROSCOPY     removal of cyst   ROBOTIC ASSISTED TOTAL HYSTERECTOMY N/A 05/02/2013   Procedure: ROBOTIC ASSISTED TOTAL HYSTERECTOMY;  Surgeon: Dickie DELENA Carder, MD;  Location: [facility];  Service: Gynecology;  Laterality: N/A;   TONGUE SURGERY     TRANSANAL HEMORRHOIDAL DEARTERIALIZATION N/A 05/31/2013   Procedure: [facility] HEMORRHOIDAL LIGATION/PEXY;  Surgeon: Elspeth KYM Schultze, MD;  Location: [facility];  Service: General;  Laterality: N/A;   TUBAL LIGATION     UNILATERAL SALPINGECTOMY Right 05/02/2013   Procedure: UNILATERAL SALPINGECTOMY;  Surgeon: Dickie DELENA Carder, MD;  Location: [facility];  Service: Gynecology;  Laterality: Right;   WISDOM TOOTH EXTRACTION      Social History   Socioeconomic History   Marital status: Single    Spouse name: Not on file   Number of children: 1   Years of education: Some College   Highest education level: Not on file  Occupational History   Occupation: BOA  Tobacco Use   Smoking  status: Never   Smokeless tobacco: Never  Vaping Use   Vaping status: Never Used  Substance and Sexual Activity   Alcohol use: Yes    Comment: occ   Drug use: No   Sexual activity: Not on file  Other Topics Concern   Not on file  Social History Narrative   ** Merged History Encounter **       Lives at home with her son Caffeine use: none Right-handed   Social Drivers of Corporate investment banker Strain: Not on file  Food Insecurity: Low Risk  (11/06/2023)   Received from [facility]   Food Insecurity    Within the past 12 months, did the food you bought just not last and you didn't have money to get more?: No    Within the past 12 months, did you worry that your food would run out before you got money to buy more?: No  Transportation Needs: Low Risk  (11/06/2023)   Received from [facility]   Transportation Needs    Within the past 12 months, has a lack of transportation kept you from medical appointments or from doing things needed for daily living?: No  Physical Activity: Not on file  Stress: Not on file  Social Connections: Not on file  Intimate Partner Violence: Not on file    Family History  Problem Relation Age of Onset   Diabetes Mother    Hyperlipidemia Father    Cancer Maternal Grandfather    Hyperlipidemia Paternal Grandfather    Diabetes Maternal Grandmother    Anesthesia problems Neg Hx    Hypotension Neg Hx    Malignant hyperthermia Neg Hx    Pseudochol deficiency Neg Hx     Allergies  Allergen Reactions   Cephalexin  Hives   Latex Itching, Swelling and Rash   Keflex  [Cephalexin ] Hives    Review of Systems  Constitutional:  Positive for malaise/fatigue.  HENT: Negative.    Eyes:  Negative for blurred vision and pain.  Respiratory:  Negative for cough and shortness of breath.   Cardiovascular:  Negative for chest pain, palpitations, claudication and leg swelling.  Gastrointestinal:  Positive for vomiting. Negative  for abdominal pain, blood in stool, constipation, diarrhea and nausea.  Genitourinary:  Positive for frequency. Negative for dysuria and urgency.  Musculoskeletal: Negative.   Skin: Negative.   Neurological:  Negative for dizziness, tingling, sensory change and headaches.  Endo/Heme/Allergies:  Negative for polydipsia.  Psychiatric/Behavioral: Negative.         Objective:   BP 122/80   Pulse 81   Ht 5' 4 (1.626 m)   Wt 174 lb 12.8 oz (79.3 kg)   LMP 04/25/2013   SpO2 98%   BMI 30.00 kg/m   Vitals:   05/09/24 0928  BP: 122/80  Pulse: 81  Height: 5' 4 (1.626 m)  Weight:  174 lb 12.8 oz (79.3 kg)  SpO2: 98%  BMI (Calculated): 29.99    Physical Exam Vitals and nursing note reviewed.  Constitutional:      Appearance: Normal appearance.  HENT:     Head: Normocephalic.  Eyes:     Extraocular Movements: Extraocular movements intact.     Pupils: Pupils are equal, round, and reactive to light.  Cardiovascular:     Rate and Rhythm: Normal rate and regular rhythm.     Pulses: Normal pulses.     Heart sounds: Normal heart sounds. No murmur heard. Pulmonary:     [place]: Pulmonary effort is normal. No respiratory distress.     Breath sounds: Normal breath sounds.  Abdominal:     General: There is no distension.     Tenderness: There is no abdominal tenderness.  Musculoskeletal:        General: No tenderness. Normal range of motion.     Cervical back: Normal range of motion and neck supple.     Right lower leg: No edema.     Left lower leg: No edema.  Skin:    General: Skin is warm and dry.     Coloration: Skin is not jaundiced.     Findings: No erythema.  Neurological:     General: No focal deficit present.     Mental Status: She is alert and oriented to person, place, and time.  Psychiatric:        Mood and Affect: Mood normal.      Results for orders placed or performed in visit on 05/09/24  POCT Urinalysis Dipstick (81002)  Result Value Ref Range   Color,  UA Orange    Clarity, UA Clear    Glucose, UA Negative Negative   Bilirubin, UA Negative    Ketones, UA Negative    Spec Grav, UA 1.015 1.010 - 1.025   Blood, UA Negative    pH, UA 7.0 5.0 - 8.0   Protein, UA Positive (A) Negative   Urobilinogen, UA 0.2 0.2 or 1.0 E.U./dL   Nitrite, UA Negative    Leukocytes, UA Negative Negative   Appearance Clear    Odor Yes     Recent Results (from the past 2160 hours)  Hepatic function panel     Status: Abnormal   Collection Time: 03/01/24 10:17 AM  Result Value Ref Range   Total Protein 6.5 6.5 - 8.1 g/dL   Albumin 3.5 3.5 - 5.0 g/dL   AST 12 (L) 15 - 41 U/L   ALT 11 0 - 44 U/L   Alkaline Phosphatase 50 38 - 126 U/L   Total Bilirubin 0.9 0.0 - 1.2 mg/dL   Bilirubin, Direct 0.1 0.0 - 0.2 mg/dL   Indirect Bilirubin 0.8 0.3 - 0.9 mg/dL    Comment: Performed at [facility], [address] [place], [place] [address]  CBC with Differential (Cancer Center Only)     Status: Abnormal   Collection Time: 03/01/24 10:17 AM  Result Value Ref Range   WBC Count 5.4 4.0 - 10.5 K/uL   RBC 4.74 3.87 - 5.11 MIL/uL   Hemoglobin 8.6 (L) 12.0 - 15.0 g/dL    Comment: Reticulocyte Hemoglobin testing may be clinically indicated, consider ordering this additional test [id number]    HCT 29.4 (L) 36.0 - 46.0 %   MCV 62.0 (L) 80.0 - 100.0 fL   MCH 18.1 (L) 26.0 - 34.0 pg   MCHC 29.3 (L) 30.0 - 36.0 g/dL   RDW 82.6 (  H) 11.5 - 15.5 %   Platelet Count 233 150 - 400 K/uL    Comment: REPEATED TO VERIFY   nRBC 0.4 (H) 0.0 - 0.2 %   Neutrophils Relative % 48 %   Neutro Abs 2.6 1.7 - 7.7 K/uL   Lymphocytes Relative 42 %   Lymphs Abs 2.3 0.7 - 4.0 K/uL   Monocytes Relative 6 %   Monocytes Absolute 0.3 0.1 - 1.0 K/uL   Eosinophils Relative 3 %   Eosinophils Absolute 0.2 0.0 - 0.5 K/uL   Basophils Relative 1 %   Basophils Absolute 0.0 0.0 - 0.1 K/uL   Immature Granulocytes 0 %   Abs Immature Granulocytes 0.01 0.00 - 0.07 K/uL    Comment: Performed at  [facility], [address] [place], [place] [address]  Iron  and TIBC     Status: None   Collection Time: 03/01/24 10:17 AM  Result Value Ref Range   Iron  79 28 - 170 ug/dL   TIBC 734 749 - 549 ug/dL   Saturation Ratios 30 10.4 - 31.8 %   UIBC 186 ug/dL    Comment: Performed at [facility], [address] [place], [place] [address]  Ferritin     Status: Abnormal   Collection Time: 03/01/24 10:17 AM  Result Value Ref Range   Ferritin 1,313 (H) 11 - 307 ng/mL    Comment: Performed at [facility], [address] [place], [place] [address]  Technologist smear review     Status: None   Collection Time: 03/01/24 10:17 AM  Result Value Ref Range   WBC MORPHOLOGY MORPHOLOGY UNREMARKABLE     Comment: Performed at [facility], [address], [place], [place] [address]   RBC MORPHOLOGY POLYCHROMASIA PRESENT     Comment: [employer] CELL[employer] BASOPHILIC STIPPLING Performed at [facility], [address] [place], [place] [address]    Plt Morphology Normal platelet morphology     Comment: Performed at [facility], [address] [place], [place] [address]   Clinical Information microcytic anemia     Comment: Performed at [facility], [address] [place], [place] [address]  TSH     Status: None   Collection Time: 03/01/24 10:17 AM  Result Value Ref Range   TSH 1.607 0.350 - 4.500 uIU/mL    Comment: Performed by a 3rd Generation assay with a functional sensitivity of <=0.01 uIU/mL. Performed at [facility], [address] [place], [place] [address]   Retic Panel     Status: Abnormal   Collection Time: 03/01/24 10:17 AM  Result Value Ref Range   Retic Ct Pct 3.4 (H) 0.4 - 3.1 %   RBC. 4.78 3.87 - 5.11 MIL/uL   Retic Count, Absolute 163.0 19.0 - 186.0 K/uL   Immature Retic Fract 22.3 (H) 2.3 - 15.9 %   Reticulocyte Hemoglobin 17.0 (L) >27.9 pg    Comment:        A RET-He < 28 pg is an indication of  iron -deficient or iron - insufficient erythropoiesis. Patients with thalassemia may also have a decreased RET-He result unrelated to iron  availability.     If this patient has chronic kidney disease and does not have a hemoglobinopathy he/she meets criteria for iron  deficiency per the 2016 NICE guidelines. Refer to specific guidelines to determine the appropriate thresholds for treating CKD- associated iron  deficiency. TSAT and ferritin should be used in patients with hemoglobinopathies (e.g. thalassemia). Performed at [facility], [phone]  [address] [place], [place] [address]   Basic Metabolic Panel - Cancer Center Only     Status: Abnormal   Collection Time: 03/01/24 10:17 AM  Result Value Ref Range   Sodium 140 135 - 145 mmol/L   Potassium 3.9 3.5 - 5.1 mmol/L   Chloride 108 98 - 111 mmol/L   CO2 24 22 - 32 mmol/L   Glucose, Bld 94 70 - 99 mg/dL    Comment: Glucose reference range applies only to samples taken after fasting for at least 8 hours.   BUN 13 6 - 20 mg/dL   Creatinine 9.38 9.55 - 1.00 mg/dL   Calcium  8.6 (L) 8.9 - 10.3 mg/dL   GFR, Estimated >39 >39 mL/min    Comment: (NOTE) Calculated using the CKD-EPI Creatinine Equation (2021)    Anion gap 8 5 - 15    Comment: Performed at [facility], [address] [place], [place] [address]  POCT XPERT XPRESS SARS COVID-2/FLU/RSV     Status: None   Collection Time: 03/28/24  1:28 PM  Result Value Ref Range   SARS Coronavirus 2 Positive    FLU A Negative    FLU B Negative    RSV RNA, PCR Negative   CBC with Differential/Platelet     Status: Abnormal   Collection Time: 04/02/24  9:24 AM  Result Value Ref Range   WBC 4.8 4.0 - 10.5 K/uL   RBC 4.73 3.87 - 5.11 MIL/uL   Hemoglobin 8.8 (L) 12.0 - 15.0 g/dL    Comment: Reticulocyte Hemoglobin testing may be clinically indicated, consider ordering this additional test [id number]    HCT 29.2 (L) 36.0 - 46.0 %   MCV 61.7 (L) 80.0 - 100.0 fL   MCH  18.6 (L) 26.0 - 34.0 pg   MCHC 30.1 30.0 - 36.0 g/dL   RDW 84.8 88.4 - 84.4 %   Platelets 222 150 - 400 K/uL   nRBC 0.4 (H) 0.0 - 0.2 %   Neutrophils Relative % 35 %   Neutro Abs 1.7 1.7 - 7.7 K/uL   Lymphocytes Relative 55 %   Lymphs Abs 2.6 0.7 - 4.0 K/uL   Monocytes Relative 8 %   Monocytes Absolute 0.4 0.1 - 1.0 K/uL   Eosinophils Relative 2 %   Eosinophils Absolute 0.1 0.0 - 0.5 K/uL   Basophils Relative 0 %   Basophils Absolute 0.0 0.0 - 0.1 K/uL   Immature Granulocytes 0 %   Abs Immature Granulocytes 0.02 0.00 - 0.07 K/uL    Comment: Performed at [facility], [address] [place], [place] [address]  Retic Panel     Status: Abnormal   Collection Time: 04/02/24  9:24 AM  Result Value Ref Range   Retic Ct Pct 1.5 0.4 - 3.1 %   RBC. 4.68 3.87 - 5.11 MIL/uL   Retic Count, Absolute 68.8 19.0 - 186.0 K/uL   Immature Retic Fract 10.3 2.3 - 15.9 %   Reticulocyte Hemoglobin 16.2 (L) >27.9 pg    Comment:        A RET-He < 28 pg is an indication of iron -deficient or iron - insufficient erythropoiesis. Patients with thalassemia may also have a decreased RET-He result unrelated to iron  availability.     If this patient has chronic kidney disease and does not have a hemoglobinopathy he/she meets criteria for iron  deficiency per the 2016 NICE guidelines. Refer to specific guidelines to determine the appropriate thresholds for treating CKD- associated iron  deficiency. TSAT and  ferritin should be used in patients with hemoglobinopathies (e.g. thalassemia). Performed at [facility], [address] [place], [place] [address]   Human parvovirus DNA detection by PCR     Status: None   Collection Time: 04/02/24  9:24 AM  Result Value Ref Range   Parvovirus B19, PCR Negative Negative    Comment: (NOTE) No Parvovirus B19 DNA detected. This test was developed and its performance characteristics determined by [employer]. It has not been cleared  or approved by the U.S. Food and Drug Administration. The FDA has determined that such clearance or approval is not necessary. This test is used for clinical purposes. It should not be regarded as investigational or research. Performed At: [facility] [address] [place], [place] [id number] Jennette Shorter MD Ey:[id number]   Folate     Status: Abnormal   Collection Time: 04/02/24  9:24 AM  Result Value Ref Range   Folate 3.4 (L) >5.9 ng/mL    Comment: Performed at [facility], [address] [place], [place] [address]  C-reactive protein     Status: None   Collection Time: 04/02/24  9:25 AM  Result Value Ref Range   CRP <0.5 <1.0 mg/dL    Comment: Performed at [facility], [address], [place], [place] [address]  Valproic  acid level     Status: None   Collection Time: 04/04/24  4:00 PM  Result Value Ref Range   Valproic  Acid Lvl 59 50 - 100 ug/mL    Comment:                                 Detection Limit = 4                            <4 indicates None Detected Toxicity may occur at levels of 100-500. Measurements of free unbound valproic  acid may improve the assess- ment of clinical response.   CBC with Differential/Platelets     Status: Abnormal   Collection Time: 04/04/24  4:00 PM  Result Value Ref Range   WBC 5.5 3.4 - 10.8 x10E3/uL   RBC 5.01 3.77 - 5.28 x10E6/uL   Hemoglobin 9.2 (L) 11.1 - 15.9 g/dL   Hematocrit 67.2 (L) 65.9 - 46.6 %   MCV 65 (L) 79 - 97 fL   MCH 18.4 (L) 26.6 - 33.0 pg   MCHC 28.1 (L) 31.5 - 35.7 g/dL   RDW 83.8 (H) 88.2 - 84.5 %   Platelets 296 150 - 450 x10E3/uL   Neutrophils 30 Not Estab. %   Lymphs 61 Not Estab. %   Monocytes 6 Not Estab. %   Eos 2 Not Estab. %   Basos 0 Not Estab. %   Neutrophils Absolute 1.7 1.4 - 7.0 x10E3/uL   Lymphocytes Absolute 3.4 (H) 0.7 - 3.1 x10E3/uL   Monocytes Absolute 0.3 0.1 - 0.9 x10E3/uL   EOS (ABSOLUTE) 0.1 0.0 - 0.4 x10E3/uL   Basophils Absolute 0.0 0.0 - 0.2 x10E3/uL    Immature Granulocytes 0 Not Estab. %   Immature Grans (Abs) 0.0 0.0 - 0.1 x10E3/uL   NRBC 1 (H) 0 - 0 %  Comprehensive metabolic panel with GFR     Status: None   Collection Time: 04/04/24  4:00 PM  Result Value Ref Range   Glucose 77 70 - 99 mg/dL   BUN 10 6 -  24 mg/dL   Creatinine, Ser 9.33 0.57 - 1.00 mg/dL   eGFR 888 >40 fO/fpw/8.26   BUN/Creatinine Ratio 15 9 - 23   Sodium 139 134 - 144 mmol/L   Potassium 3.8 3.5 - 5.2 mmol/L   Chloride 103 96 - 106 mmol/L   CO2 24 20 - 29 mmol/L   Calcium  9.1 8.7 - 10.2 mg/dL   Total Protein 6.3 6.0 - 8.5 g/dL   Albumin 3.9 3.9 - 4.9 g/dL   Globulin, Total 2.4 1.5 - 4.5 g/dL   Bilirubin Total 0.4 0.0 - 1.2 mg/dL   Alkaline Phosphatase 61 44 - 121 IU/L   AST 11 0 - 40 IU/L   ALT 10 0 - 32 IU/L  POCT Urinalysis Dipstick (18997)     Status: Abnormal   Collection Time: 05/09/24 10:30 AM  Result Value Ref Range   Color, UA Orange    Clarity, UA Clear    Glucose, UA Negative Negative   Bilirubin, UA Negative    Ketones, UA Negative    Spec Grav, UA 1.015 1.010 - 1.025   Blood, UA Negative    pH, UA 7.0 5.0 - 8.0   Protein, UA Positive (A) Negative   Urobilinogen, UA 0.2 0.2 or 1.0 E.U./dL   Nitrite, UA Negative    Leukocytes, UA Negative Negative   Appearance Clear    Odor Yes        Assessment & Plan Dysuria Acute vaginitis - UA and vaginal swab collected; diflucan  150 mg prescribed  Generalized idiopathic epilepsy and epileptic syndromes, not intractable, without status epilepticus (HCC) Patient is seen by neurology, who manage this condition.  She is well controlled with current therapy.   Will defer to them for further changes to plan of care.  Dehydration Other fatigue - lab work collected today - Saline fluid bolus 1,000 ml given in office today  Type 2 diabetes mellitus with hyperglycemia, without long-term current use of insulin  (HCC) Checking labs today. Will call pt. With results  Continue current diabetes  POC, as patient has been well controlled on current regimen.  Will adjust meds if needed based on labs.   -CBC w/Diff -CMP w/eGFR -Hemoglobin A1C  B12 deficiency due to diet Vitamin D  deficiency, unspecified Beta thalassemia (HCC) Checking labs today.  Will continue supplements as needed.   - Vitamin D  - Vitamin B12 - TSH  Mixed hyperlipidemia Checking labs today.  Continue current therapy for lipid control. Will modify as needed based on labwork results.   -CMP w/eGFR -Lipid Panel     Return in about 2 months (around 07/09/2024).   Total time spent: 45 minutes  Oddis DELENA Cain, FNP  05/09/2024   This document may have been prepared by [facility] Voice Recognition software and as such may include unintentional dictation errors.

## 2024-05-09 NOTE — Assessment & Plan Note (Addendum)
 Patient is seen by neurology, who manage this condition.  She is well controlled with current therapy.   Will defer to them for further changes to plan of care.

## 2024-05-09 NOTE — Assessment & Plan Note (Addendum)
 Checking labs today. Will call pt. With results  Continue current diabetes POC, as patient has been well controlled on current regimen.  Will adjust meds if needed based on labs.   -CBC w/Diff -CMP w/eGFR -Hemoglobin A1C

## 2024-05-10 LAB — CBC WITH DIFFERENTIAL/PLATELET
Basophils Absolute: 0 x10E3/uL (ref 0.0–0.2)
Basos: 1 %
EOS (ABSOLUTE): 0.1 x10E3/uL (ref 0.0–0.4)
Eos: 2 %
Hematocrit: 29.3 % — ABNORMAL LOW (ref 34.0–46.6)
Hemoglobin: 8.1 g/dL — ABNORMAL LOW (ref 11.1–15.9)
Immature Grans (Abs): 0 x10E3/uL (ref 0.0–0.1)
Immature Granulocytes: 0 %
Lymphocytes Absolute: 2.4 x10E3/uL (ref 0.7–3.1)
Lymphs: 53 %
MCH: 18.2 pg — ABNORMAL LOW (ref 26.6–33.0)
MCHC: 27.6 g/dL — ABNORMAL LOW (ref 31.5–35.7)
MCV: 66 fL — ABNORMAL LOW (ref 79–97)
Monocytes Absolute: 0.3 x10E3/uL (ref 0.1–0.9)
Monocytes: 6 %
Neutrophils Absolute: 1.8 x10E3/uL (ref 1.4–7.0)
Neutrophils: 38 %
Platelets: 218 x10E3/uL (ref 150–450)
RBC: 4.45 x10E6/uL (ref 3.77–5.28)
RDW: 15.8 % — ABNORMAL HIGH (ref 11.7–15.4)
WBC: 4.6 x10E3/uL (ref 3.4–10.8)

## 2024-05-10 LAB — CMP14+EGFR
ALT: 11 IU/L (ref 0–32)
AST: 10 IU/L (ref 0–40)
Albumin: 3.2 g/dL — ABNORMAL LOW (ref 3.9–4.9)
Alkaline Phosphatase: 46 IU/L (ref 41–116)
BUN/Creatinine Ratio: 22 (ref 9–23)
BUN: 9 mg/dL (ref 6–24)
Bilirubin Total: 0.5 mg/dL (ref 0.0–1.2)
CO2: 15 mmol/L — ABNORMAL LOW (ref 20–29)
Calcium: 7 mg/dL — ABNORMAL LOW (ref 8.7–10.2)
Chloride: 112 mmol/L — ABNORMAL HIGH (ref 96–106)
Creatinine, Ser: 0.41 mg/dL — ABNORMAL LOW (ref 0.57–1.00)
Globulin, Total: 1.6 g/dL (ref 1.5–4.5)
Glucose: 54 mg/dL — ABNORMAL LOW (ref 70–99)
Potassium: 3.2 mmol/L — ABNORMAL LOW (ref 3.5–5.2)
Sodium: 142 mmol/L (ref 134–144)
Total Protein: 4.8 g/dL — ABNORMAL LOW (ref 6.0–8.5)
eGFR: 124 mL/min/1.73 (ref >59)

## 2024-05-10 LAB — LIPID PANEL
Chol/HDL Ratio: 2.7 ratio (ref 0.0–4.4)
Cholesterol, Total: 116 mg/dL (ref 100–199)
HDL: 43 mg/dL (ref >39)
LDL Chol Calc (NIH): 59 mg/dL (ref 0–99)
Triglycerides: 66 mg/dL (ref 0–149)
VLDL Cholesterol Cal: 14 mg/dL (ref 5–40)

## 2024-05-10 LAB — VITAMIN B12: Vitamin B-12: 435 pg/mL (ref 232–1245)

## 2024-05-10 LAB — VITAMIN D 25 HYDROXY (VIT D DEFICIENCY, FRACTURES): Vit D, 25-Hydroxy: 18.4 ng/mL — ABNORMAL LOW (ref 30.0–100.0)

## 2024-05-10 LAB — HEMOGLOBIN A1C
Est. average glucose Bld gHb Est-mCnc: 94 mg/dL
Hgb A1c MFr Bld: 4.9 % (ref 4.8–5.6)

## 2024-05-10 LAB — TSH: TSH: 1.07 u[IU]/mL (ref 0.450–4.500)

## 2024-05-12 LAB — NUSWAB VAGINITIS PLUS (VG+)
Atopobium vaginae: HIGH {score} — AB
BVAB 2: HIGH {score} — AB
Candida albicans, NAA: NEGATIVE
Candida glabrata, NAA: NEGATIVE
Chlamydia trachomatis, NAA: NEGATIVE
Megasphaera 1: HIGH {score} — AB
Neisseria gonorrhoeae, NAA: NEGATIVE
Trich vag by NAA: NEGATIVE

## 2024-06-04 ENCOUNTER — Encounter: Payer: Self-pay | Admitting: Family

## 2024-06-07 ENCOUNTER — Other Ambulatory Visit: Payer: Self-pay | Admitting: Family

## 2024-06-15 ENCOUNTER — Ambulatory Visit: Payer: Self-pay

## 2024-06-15 ENCOUNTER — Encounter: Payer: Self-pay | Admitting: Family

## 2024-06-15 ENCOUNTER — Other Ambulatory Visit: Payer: Self-pay

## 2024-06-15 DIAGNOSIS — I10 Essential (primary) hypertension: Secondary | ICD-10-CM

## 2024-06-15 MED ORDER — METRONIDAZOLE 500 MG PO TABS: 500.0000 mg | ORAL_TABLET | Freq: Two times a day (BID) | ORAL | 0 refills | Status: AC

## 2024-06-25 ENCOUNTER — Other Ambulatory Visit

## 2024-06-26 LAB — CBC WITH DIFFERENTIAL/PLATELET
Basophils Absolute: 0 x10E3/uL (ref 0.0–0.2)
Basos: 1 %
EOS (ABSOLUTE): 0.2 x10E3/uL (ref 0.0–0.4)
Eos: 3 %
Hematocrit: 31 % — ABNORMAL LOW (ref 34.0–46.6)
Hemoglobin: 9 g/dL — ABNORMAL LOW (ref 11.1–15.9)
Immature Grans (Abs): 0 x10E3/uL (ref 0.0–0.1)
Immature Granulocytes: 0 %
Lymphocytes Absolute: 2.8 x10E3/uL (ref 0.7–3.1)
Lymphs: 45 %
MCH: 19 pg — ABNORMAL LOW (ref 26.6–33.0)
MCHC: 29 g/dL — ABNORMAL LOW (ref 31.5–35.7)
MCV: 65 fL — ABNORMAL LOW (ref 79–97)
Monocytes Absolute: 0.4 x10E3/uL (ref 0.1–0.9)
Monocytes: 7 %
NRBC: 1 % — ABNORMAL HIGH (ref 0–0)
Neutrophils Absolute: 2.6 x10E3/uL (ref 1.4–7.0)
Neutrophils: 43 %
Platelets: 278 x10E3/uL (ref 150–450)
RBC: 4.74 x10E6/uL (ref 3.77–5.28)
RDW: 18.3 % — ABNORMAL HIGH (ref 11.7–15.4)
WBC: 6 x10E3/uL (ref 3.4–10.8)

## 2024-06-26 LAB — CMP14+EGFR
ALT: 10 IU/L (ref 0–32)
AST: 10 IU/L (ref 0–40)
Albumin: 4 g/dL (ref 3.9–4.9)
Alkaline Phosphatase: 57 IU/L (ref 41–116)
BUN/Creatinine Ratio: 23 (ref 9–23)
BUN: 16 mg/dL (ref 6–24)
Bilirubin Total: 0.5 mg/dL (ref 0.0–1.2)
CO2: 22 mmol/L (ref 20–29)
Calcium: 9 mg/dL (ref 8.7–10.2)
Chloride: 106 mmol/L (ref 96–106)
Creatinine, Ser: 0.7 mg/dL (ref 0.57–1.00)
Globulin, Total: 1.9 g/dL (ref 1.5–4.5)
Glucose: 94 mg/dL (ref 70–99)
Potassium: 4.3 mmol/L (ref 3.5–5.2)
Sodium: 137 mmol/L (ref 134–144)
Total Protein: 5.9 g/dL — ABNORMAL LOW (ref 6.0–8.5)
eGFR: 109 mL/min/1.73 (ref >59)

## 2024-06-28 ENCOUNTER — Ambulatory Visit: Payer: Self-pay

## 2024-07-09 ENCOUNTER — Ambulatory Visit: Admitting: Family

## 2024-07-09 ENCOUNTER — Encounter: Payer: Self-pay | Admitting: Family

## 2024-07-09 VITALS — BP 110/68 | HR 73 | Ht 64.0 in | Wt 190.2 lb

## 2024-07-09 DIAGNOSIS — G40001 Localization-related (focal) (partial) idiopathic epilepsy and epileptic syndromes with seizures of localized onset, not intractable, with status epilepticus: Secondary | ICD-10-CM

## 2024-07-09 DIAGNOSIS — E1165 Type 2 diabetes mellitus with hyperglycemia: Secondary | ICD-10-CM | POA: Diagnosis not present

## 2024-07-09 DIAGNOSIS — E782 Mixed hyperlipidemia: Secondary | ICD-10-CM | POA: Diagnosis not present

## 2024-07-09 DIAGNOSIS — R5383 Other fatigue: Secondary | ICD-10-CM

## 2024-07-09 DIAGNOSIS — E1169 Type 2 diabetes mellitus with other specified complication: Secondary | ICD-10-CM

## 2024-07-09 DIAGNOSIS — I10 Essential (primary) hypertension: Secondary | ICD-10-CM

## 2024-07-09 DIAGNOSIS — E538 Deficiency of other specified B group vitamins: Secondary | ICD-10-CM

## 2024-07-09 LAB — POC CREATINE & ALBUMIN,URINE
Creatinine, POC: 100 mg/dL
Microalbumin Ur, POC: 80 mg/L

## 2024-07-09 NOTE — Assessment & Plan Note (Signed)
 Will continue supplements as needed.

## 2024-07-09 NOTE — Patient Instructions (Signed)
 Start with 0.25 mg weekly of Ozempic  for 2 weeks.   Increase to 0.5mg  dose after 2, use for 3 weeks until pen runs out.   May then take 1/2 of a 2 mg dose for 2 weeks then increase back to 2 mg dose.

## 2024-07-09 NOTE — Progress Notes (Signed)
 Established Patient Office Visit  Subjective:  Patient ID: Abigail Hall, female    DOB: 12/19/1980  Age: 43 y.o. MRN: [id number]  Chief Complaint  Patient presents with   Follow-up    2 month follow up    Patient is here today for her 2 months follow up.  She has been feeling fairly well since last appointment.   She does have additional concerns to discuss today.  Her Neurologist has left the practice she was previously seeing, so she asks if we can get her set up with a different provider.   Labs were done prior to appointment, so we will review these in detail today.  She needs refills.   I have reviewed her active problem list, medication list, allergies, notes from last encounter, lab results for her appointment today.      No other concerns at this time.   Past Medical History:  Diagnosis Date   Anemia    Anemia, iron  deficiency 04/10/2012   Appendicitis 06/04/2020   Constipation    Depression 04/26/2013   history - no current problems   Generalized headaches    Hemorrhoids    History of blood transfusion 02/2013   [facility]  2 units transfused    History of laparoscopic partial gastrectomy 03/01/2017   Medication exposure during first trimester of pregnancy 07/15/2011   No issues at this time     Nausea & vomiting    resolved after delivery - C/S, extreme   Normocytic anemia 12/13/2011   Monitoring closely and hem/onc is involved     Rectal bleeding    Rectal pain    Seasonal allergies    Seizure (HCC)    Seizures (HCC)    last seizure was  08/2012   Thalassemia    Weakness    resolved after C/S delivery    Past Surgical History:  Procedure Laterality Date   ABDOMINAL HYSTERECTOMY     CARPAL TUNNEL RELEASE  01/2009   right   CESAREAN SECTION  12/31/2011   Procedure: CESAREAN SECTION;  Surgeon: Toribio VEAR Ada, MD;  Location: [facility];  Service: Gynecology;  Laterality: N/A;   DILATION AND CURETTAGE OF UTERUS     endometrial ablation    EVALUATION UNDER ANESTHESIA WITH HEMORRHOIDECTOMY N/A 05/31/2013   Procedure: EXAM UNDER ANESTHESIA WITH EXTERNAL HEMORRHOIDECTOMY;  Surgeon: Elspeth KYM Schultze, MD;  Location: [facility];  Service: General;  Laterality: N/A;   LABIOPLASTY  03/27/2012   Procedure: LABIAPLASTY;  Surgeon: Toribio VEAR Ada, MD;  Location: [facility];  Service: Gynecology;  Laterality: N/A;  labia   LAPAROSCOPIC APPENDECTOMY N/A 06/05/2020   Procedure: APPENDECTOMY LAPAROSCOPIC;  Surgeon: Kinsinger, Herlene Righter, MD;  Location: [facility];  Service: General;  Laterality: N/A;   LAPAROSCOPIC TUBAL LIGATION  03/27/2012   Procedure: LAPAROSCOPIC TUBAL LIGATION;  Surgeon: Toribio VEAR Ada, MD;  Location: [facility];  Service: Gynecology;  Laterality: Bilateral;  fallopian tubes caurtery   LAPAROSCOPY     removal of cyst   ROBOTIC ASSISTED TOTAL HYSTERECTOMY N/A 05/02/2013   Procedure: ROBOTIC ASSISTED TOTAL HYSTERECTOMY;  Surgeon: Dickie DELENA Carder, MD;  Location: [facility];  Service: Gynecology;  Laterality: N/A;   TONGUE SURGERY     TRANSANAL HEMORRHOIDAL DEARTERIALIZATION N/A 05/31/2013   Procedure: THD HEMORRHOIDAL LIGATION/PEXY;  Surgeon: Elspeth KYM Schultze, MD;  Location: [facility];  Service: General;  Laterality: N/A;   TUBAL LIGATION     UNILATERAL SALPINGECTOMY Right 05/02/2013   Procedure: UNILATERAL SALPINGECTOMY;  Surgeon: Dickie  DELENA Carder, MD;  Location: [facility];  Service: Gynecology;  Laterality: Right;   WISDOM TOOTH EXTRACTION      Social History   Socioeconomic History   Marital status: Single    Spouse name: Not on file   Number of children: 1   Years of education: Some College   Highest education level: Not on file  Occupational History   Occupation: [employer]A  Tobacco Use   Smoking status: Never   Smokeless tobacco: Never  Vaping Use   Vaping status: Never Used  Substance and Sexual Activity   Alcohol use: Yes    Comment: occ   Drug use: No   Sexual activity: Not on file  Other Topics Concern   Not on file  Social  History Narrative   ** Merged History Encounter **       Lives at home with her son Caffeine use: none Right-handed   Social Drivers of Corporate Investment Banker Strain: Not on file  Food Insecurity: Low Risk (11/06/2023)   Received from [facility]   Food Insecurity    Within the past 12 months, did the food you bought just not last and you didn't have money to get more?: No    Within the past 12 months, did you worry that your food would run out before you got money to buy more?: No  Transportation Needs: Low Risk (11/06/2023)   Received from [facility]   Transportation Needs    Within the past 12 months, has a lack of transportation kept you from medical appointments or from doing things needed for daily living?: No  Physical Activity: Not on file  Stress: Not on file  Social Connections: Not on file  Intimate Partner Violence: Not on file    Family History  Problem Relation Age of Onset   Diabetes Mother    Hyperlipidemia Father    Cancer Maternal Grandfather    Hyperlipidemia Paternal Grandfather    Diabetes Maternal Grandmother    Anesthesia problems Neg Hx    Hypotension Neg Hx    Malignant hyperthermia Neg Hx    Pseudochol deficiency Neg Hx     Allergies  Allergen Reactions   Cephalexin  Hives   Latex Itching, Swelling and Rash   Keflex  [Cephalexin ] Hives    Review of Systems  All other systems reviewed and are negative.      Objective:   BP 110/68   Pulse 73   Ht 5' 4 (1.626 m)   Wt 190 lb 3.2 oz (86.3 kg)   LMP 04/25/2013   SpO2 99%   BMI 32.65 kg/m   Vitals:   07/09/24 1018  BP: 110/68  Pulse: 73  Height: 5' 4 (1.626 m)  Weight: 190 lb 3.2 oz (86.3 kg)  SpO2: 99%  BMI (Calculated): 32.63    Physical Exam Vitals and nursing note reviewed.  Constitutional:      Appearance: Normal appearance. She is normal weight.  HENT:     Head: Normocephalic.  Eyes:     Extraocular Movements: Extraocular  movements intact.     Conjunctiva/sclera: Conjunctivae normal.     Pupils: Pupils are equal, round, and reactive to light.  Cardiovascular:     Rate and Rhythm: Normal rate.  Pulmonary:     Effort: Pulmonary effort is normal.  Neurological:     General: No focal deficit present.     Mental Status: She is alert and oriented to person, place, and time.  Mental status is at baseline.  Psychiatric:        Mood and Affect: Mood normal.        Behavior: Behavior normal.        Thought Content: Thought content normal.        Judgment: Judgment normal.      No results found for any visits on 07/09/24.  Recent Results (from the past 2160 hours)  POCT Urinalysis Dipstick (18997)     Status: Abnormal   Collection Time: 05/09/24 10:30 AM  Result Value Ref Range   Color, UA Orange    Clarity, UA Clear    Glucose, UA Negative Negative   Bilirubin, UA Negative    Ketones, UA Negative    Spec Grav, UA 1.015 1.010 - 1.025   Blood, UA Negative    pH, UA 7.0 5.0 - 8.0   Protein, UA Positive (A) Negative   Urobilinogen, UA 0.2 0.2 or 1.0 E.U./dL   Nitrite, UA Negative    Leukocytes, UA Negative Negative   Appearance Clear    Odor Yes   NuSwab Vaginitis Plus (VG+)     Status: Abnormal   Collection Time: 05/09/24 10:35 AM  Result Value Ref Range   Atopobium vaginae High - 2 (A) Score   BVAB 2 High - 2 (A) Score   Megasphaera 1 High - 2 (A) Score    Comment: Calculate total score by adding the 3 individual bacterial vaginosis (BV) marker scores together.  Total score is interpreted as follows: Total score 0-1: Indicates the absence of BV. Total score   2: Indeterminate for BV. Additional clinical                  data should be evaluated to establish a                  diagnosis. Total score 3-6: Indicates the presence of BV.    Candida albicans, NAA Negative Negative   Candida glabrata, NAA Negative Negative   Trich vag by NAA Negative Negative   Chlamydia trachomatis, NAA Negative  Negative   Neisseria gonorrhoeae, NAA Negative Negative  CMP14+EGFR     Status: Abnormal   Collection Time: 05/09/24 11:14 AM  Result Value Ref Range   Glucose 54 (L) 70 - 99 mg/dL   BUN 9 6 - 24 mg/dL   Creatinine, Ser 9.58 (L) 0.57 - 1.00 mg/dL   eGFR 875 >40 fO/fpw/8.26   BUN/Creatinine Ratio 22 9 - 23   Sodium 142 134 - 144 mmol/L   Potassium 3.2 (L) 3.5 - 5.2 mmol/L   Chloride 112 (H) 96 - 106 mmol/L   CO2 15 (L) 20 - 29 mmol/L   Calcium  7.0 (L) 8.7 - 10.2 mg/dL    Comment: **Verified by repeat analysis**   Total Protein 4.8 (L) 6.0 - 8.5 g/dL   Albumin 3.2 (L) 3.9 - 4.9 g/dL   Globulin, Total 1.6 1.5 - 4.5 g/dL   Bilirubin Total 0.5 0.0 - 1.2 mg/dL   Alkaline Phosphatase 46 41 - 116 IU/L    Comment:               **Please note reference interval change**   AST 10 0 - 40 IU/L   ALT 11 0 - 32 IU/L  Lipid panel     Status: None   Collection Time: 05/09/24 11:14 AM  Result Value Ref Range   Cholesterol, Total 116 100 - 199 mg/dL   Triglycerides 66 0 -  149 mg/dL   HDL 43 >60 mg/dL   VLDL Cholesterol Cal 14 5 - 40 mg/dL   LDL Chol Calc (NIH) 59 0 - 99 mg/dL   Chol/HDL Ratio 2.7 0.0 - 4.4 ratio    Comment:                                   T. Chol/HDL Ratio                                             Men  Women                               1/2 Avg.Risk  3.4    3.3                                   Avg.Risk  5.0    4.4                                2X Avg.Risk  9.6    7.1                                3X Avg.Risk 23.4   11.0   VITAMIN D  25 Hydroxy (Vit-D Deficiency, Fractures)     Status: Abnormal   Collection Time: 05/09/24 11:14 AM  Result Value Ref Range   Vit D, 25-Hydroxy 18.4 (L) 30.0 - 100.0 ng/mL    Comment: Vitamin D  deficiency has been defined by the Institute of Medicine and an Endocrine Society practice guideline as a level of serum 25-OH vitamin D  less than 20 ng/mL (1,2). The Endocrine Society went on to further define vitamin D  insufficiency as a level  between 21 and 29 ng/mL (2). 1. IOM (Institute of Medicine). 2010. Dietary reference    intakes for calcium  and D. [place]: The    [employer]. 2. Holick MF, Binkley Elwood, Bischoff-Ferrari HA, et al.    Evaluation, treatment, and prevention of vitamin D     deficiency: an Endocrine Society clinical practice    guideline. JCEM. 2011 Jul; 96(7):1911-30.   Vitamin B12     Status: None   Collection Time: 05/09/24 11:14 AM  Result Value Ref Range   Vitamin B-12 435 232 - 1,245 pg/mL  CBC with Diff     Status: Abnormal   Collection Time: 05/09/24 11:14 AM  Result Value Ref Range   WBC 4.6 3.4 - 10.8 x10E3/uL   RBC 4.45 3.77 - 5.28 x10E6/uL   Hemoglobin 8.1 (L) 11.1 - 15.9 g/dL   Hematocrit 70.6 (L) 65.9 - 46.6 %   MCV 66 (L) 79 - 97 fL   MCH 18.2 (L) 26.6 - 33.0 pg   MCHC 27.6 (L) 31.5 - 35.7 g/dL   RDW 84.1 (H) 88.2 - 84.5 %   Platelets 218 150 - 450 x10E3/uL   Neutrophils 38 Not Estab. %   Lymphs 53 Not Estab. %   Monocytes 6 Not Estab. %   Eos 2 Not Estab. %   Basos 1 Not Estab. %  Neutrophils Absolute 1.8 1.4 - 7.0 x10E3/uL   Lymphocytes Absolute 2.4 0.7 - 3.1 x10E3/uL   Monocytes Absolute 0.3 0.1 - 0.9 x10E3/uL   EOS (ABSOLUTE) 0.1 0.0 - 0.4 x10E3/uL   Basophils Absolute 0.0 0.0 - 0.2 x10E3/uL   Immature Granulocytes 0 Not Estab. %   Immature Grans (Abs) 0.0 0.0 - 0.1 x10E3/uL  Hemoglobin A1c     Status: None   Collection Time: 05/09/24 11:14 AM  Result Value Ref Range   Hgb A1c MFr Bld 4.9 4.8 - 5.6 %    Comment:          Prediabetes: 5.7 - 6.4          Diabetes: >6.4          Glycemic control for adults with diabetes: <7.0    Est. average glucose Bld gHb Est-mCnc 94 mg/dL  TSH     Status: None   Collection Time: 05/09/24 11:14 AM  Result Value Ref Range   TSH 1.070 0.450 - 4.500 uIU/mL  CMP14+EGFR     Status: Abnormal   Collection Time: 06/25/24  2:22 PM  Result Value Ref Range   Glucose 94 70 - 99 mg/dL   BUN 16 6 - 24 mg/dL   Creatinine, Ser  9.29 0.57 - 1.00 mg/dL   eGFR 890 >40 fO/fpw/8.26   BUN/Creatinine Ratio 23 9 - 23   Sodium 137 134 - 144 mmol/L   Potassium 4.3 3.5 - 5.2 mmol/L   Chloride 106 96 - 106 mmol/L   CO2 22 20 - 29 mmol/L   Calcium  9.0 8.7 - 10.2 mg/dL   Total Protein 5.9 (L) 6.0 - 8.5 g/dL   Albumin 4.0 3.9 - 4.9 g/dL   Globulin, Total 1.9 1.5 - 4.5 g/dL   Bilirubin Total 0.5 0.0 - 1.2 mg/dL   Alkaline Phosphatase 57 41 - 116 IU/L   AST 10 0 - 40 IU/L   ALT 10 0 - 32 IU/L  CBC with Diff     Status: Abnormal   Collection Time: 06/25/24  2:22 PM  Result Value Ref Range   WBC 6.0 3.4 - 10.8 x10E3/uL   RBC 4.74 3.77 - 5.28 x10E6/uL   Hemoglobin 9.0 (L) 11.1 - 15.9 g/dL   Hematocrit 68.9 (L) 65.9 - 46.6 %   MCV 65 (L) 79 - 97 fL   MCH 19.0 (L) 26.6 - 33.0 pg   MCHC 29.0 (L) 31.5 - 35.7 g/dL   RDW 81.6 (H) 88.2 - 84.5 %   Platelets 278 150 - 450 x10E3/uL   Neutrophils 43 Not Estab. %   Lymphs 45 Not Estab. %   Monocytes 7 Not Estab. %   Eos 3 Not Estab. %   Basos 1 Not Estab. %   Neutrophils Absolute 2.6 1.4 - 7.0 x10E3/uL   Lymphocytes Absolute 2.8 0.7 - 3.1 x10E3/uL   Monocytes Absolute 0.4 0.1 - 0.9 x10E3/uL   EOS (ABSOLUTE) 0.2 0.0 - 0.4 x10E3/uL   Basophils Absolute 0.0 0.0 - 0.2 x10E3/uL   Immature Granulocytes 0 Not Estab. %   Immature Grans (Abs) 0.0 0.0 - 0.1 x10E3/uL   NRBC 1 (H) 0 - 0 %       Assessment & Plan Localization-related idiopathic epilepsy and epileptic syndromes with seizures of localized onset, not intractable, with status epilepticus (HCC) Setting patient up for referral to neurology .  Will defer to them for further treatment changes.  Reassess at follow up.  Type 2 diabetes mellitus with  hyperglycemia, without long-term current use of insulin  (HCC) Restart Ozempic , starting at 0.25mg  dose, and increase as directed in patient instructions section.   Continue current diabetes POC, as patient has been well controlled on current regimen.  Will adjust meds if needed  based on labs.   Other fatigue B12 deficiency due to diet Will continue supplements as needed.   Morbid obesity (HCC) Continue current meds.  Will adjust as needed based on results.  The patient is asked to make an attempt to improve diet and exercise patterns to aid in medical management of this problem. Addressed importance of increasing and maintaining water intake.   Essential hypertension, benign Blood pressure well controlled with current medications.  Continue current therapy.  Will reassess at follow up.   Mixed hyperlipidemia due to type 2 diabetes mellitus (HCC) Continue current therapy for lipid control. Will modify as needed based on labwork results.      Return in about 3 months (around 10/09/2024) for F/U.   Total time spent: 20 minutes  ALAN CHRISTELLA ARRANT, FNP  07/09/2024   This document may have been prepared by [facility] Voice Recognition software and as such may include unintentional dictation errors.

## 2024-07-09 NOTE — Assessment & Plan Note (Signed)
 Setting patient up for referral to neurology.  Will defer to them for further treatment changes.  Reassess at follow up.

## 2024-07-09 NOTE — Assessment & Plan Note (Signed)
 Restart Ozempic , starting at 0.25mg  dose, and increase as directed in patient instructions section.   Continue current diabetes POC, as patient has been well controlled on current regimen.  Will adjust meds if needed based on labs.

## 2024-07-09 NOTE — Assessment & Plan Note (Signed)
 Continue current meds.  Will adjust as needed based on results.  The patient is asked to make an attempt to improve diet and exercise patterns to aid in medical management of this problem. Addressed importance of increasing and maintaining water  intake.

## 2024-07-09 NOTE — Assessment & Plan Note (Signed)
 Continue current therapy for lipid control. Will modify as needed based on labwork results.

## 2024-07-09 NOTE — Assessment & Plan Note (Signed)
 Blood pressure well controlled with current medications.  Continue current therapy.  Will reassess at follow up.

## 2024-08-21 DIAGNOSIS — G40309 Generalized idiopathic epilepsy and epileptic syndromes, not intractable, without status epilepticus: Secondary | ICD-10-CM

## 2024-08-21 MED ORDER — DEPAKOTE ER 250 MG PO TB24: 1000.0000 mg | ORAL_TABLET | Freq: Every day | ORAL | 3 refills | Status: AC

## 2024-08-22 ENCOUNTER — Encounter: Admission: RE | Disposition: A | Payer: Self-pay | Source: Home / Self Care | Attending: Internal Medicine

## 2024-08-22 ENCOUNTER — Ambulatory Visit: Admitting: General Practice

## 2024-08-22 ENCOUNTER — Ambulatory Visit
Admission: RE | Admit: 2024-08-22 | Discharge: 2024-08-22 | Disposition: A | Discharge status: Home / Self Care | Attending: Internal Medicine | Admitting: Internal Medicine

## 2024-08-22 ENCOUNTER — Encounter: Payer: Self-pay | Admitting: Internal Medicine

## 2024-08-22 DIAGNOSIS — E669 Obesity, unspecified: Secondary | ICD-10-CM | POA: Diagnosis not present

## 2024-08-22 DIAGNOSIS — Z79899 Other long term (current) drug therapy: Secondary | ICD-10-CM | POA: Insufficient documentation

## 2024-08-22 DIAGNOSIS — K641 Second degree hemorrhoids: Secondary | ICD-10-CM | POA: Diagnosis not present

## 2024-08-22 DIAGNOSIS — K921 Melena: Secondary | ICD-10-CM | POA: Diagnosis present

## 2024-08-22 DIAGNOSIS — K5909 Other constipation: Secondary | ICD-10-CM | POA: Diagnosis not present

## 2024-08-22 DIAGNOSIS — Z6830 Body mass index (BMI) 30.0-30.9, adult: Secondary | ICD-10-CM | POA: Insufficient documentation

## 2024-08-22 DIAGNOSIS — G709 Myoneural disorder, unspecified: Secondary | ICD-10-CM | POA: Insufficient documentation

## 2024-08-22 DIAGNOSIS — D563 Thalassemia minor: Secondary | ICD-10-CM | POA: Diagnosis not present

## 2024-08-22 DIAGNOSIS — R194 Change in bowel habit: Secondary | ICD-10-CM | POA: Diagnosis present

## 2024-08-22 HISTORY — PX: COLONOSCOPY: SHX5424

## 2024-08-22 MED ORDER — PHENYLEPHRINE 80 MCG/ML (10ML) SYRINGE FOR IV PUSH (FOR BLOOD PRESSURE SUPPORT)
PREFILLED_SYRINGE | INTRAVENOUS | Status: AC
  Filled 2024-08-22: qty 10

## 2024-08-22 MED ORDER — PROPOFOL 1000 MG/100ML IV EMUL
INTRAVENOUS | Status: AC
  Filled 2024-08-22: qty 100

## 2024-08-22 MED ORDER — SODIUM CHLORIDE 0.9 % IV SOLN: INTRAVENOUS | Status: DC

## 2024-08-22 MED ORDER — PROPOFOL 10 MG/ML IV BOLUS
INTRAVENOUS | Status: DC | PRN
  Administered 2024-08-22: 200 ug/kg/min via INTRAVENOUS
  Administered 2024-08-22: 100 mg via INTRAVENOUS

## 2024-08-22 MED ORDER — PHENYLEPHRINE 80 MCG/ML (10ML) SYRINGE FOR IV PUSH (FOR BLOOD PRESSURE SUPPORT)
PREFILLED_SYRINGE | INTRAVENOUS | Status: DC | PRN
  Administered 2024-08-22 (×2): 160 ug via INTRAVENOUS

## 2024-08-22 NOTE — Anesthesia Postprocedure Evaluation (Signed)
"   Anesthesia Post Note  Patient: Landscape Architect  Procedure(s) Performed: COLONOSCOPY  Patient location during evaluation: Endoscopy Anesthesia Type: General Level of consciousness: awake and alert Pain management: pain level controlled Vital Signs Assessment: post-procedure vital signs reviewed and stable Respiratory status: spontaneous breathing, nonlabored ventilation, respiratory function stable and patient connected to nasal cannula oxygen Cardiovascular status: blood pressure returned to baseline and stable Postop Assessment: no apparent nausea or vomiting Anesthetic complications: no   There were no known notable events for this encounter.   Last Vitals:  Vitals:   08/22/24 1116 08/22/24 1126  BP: (!) 92/52 111/78  Pulse:    Resp: 17 16  Temp:    SpO2: 100% 100%    Last Pain:  Vitals:   08/22/24 1126  TempSrc:   PainSc: 0-No pain                 Debby Mines      "

## 2024-08-22 NOTE — H&P (Signed)
 Outpatient short stay form Pre-procedure 08/22/2024 9:54 AM Abigail Hall, M.D.  Primary Physician: Alan Arrant, NP  Reason for visit:  Hematochezia, change in bowel habits  History of present illness:  prolapse.  Hemorrhoidal symptoms began in her early twenties and worsened after childbirth in 2013. Underwent hemorrhoidectomy in 2014, with persistent symptoms of skin tags and a sensation of incomplete resolution postoperatively. Reports ongoing intermittent rectal pain, bleeding, and prolapsing tissue since surgery. Pain during flares is severe, most recently in June or July 2025, resulting in inability to sit or lie comfortably. Suppositories provided some relief during the last episode, but insurance coverage has been inconsistent. Topical creams have also been used.  Rectal bleeding is intermittent, typically noted on tissue after bowel movements, without passage of large amounts of blood. Prolapsing tissue is sometimes manually reduced. She reports external tags and tissue that can prolapse, which she sometimes manually reduces.  Chronic constipation has persisted since before hemorrhoidectomy. Since June or July 2025, Linzess  has improved bowel regularity to one to two bowel movements daily, usually in the morning after taking Linzess  with fluids, without need to strain. Occasionally skips doses to avoid loose stools. Constipation is less problematic with adequate hydration and dietary intake of greens and vegetables, though intake has been inconsistent recently.  Colonoscopy was performed around the time of her hemorrhoidectomy in 2014, but findings are not recalled. No personal or family history of colon cancer or polyps. Father also had hemorrhoids.  Occasional abdominal pain occurs, particularly with constipation or during bowel movements, and sometimes experiences abdominal tightness prior to defecation. No heartburn, dysphagia, nausea, or vomiting.    Current  Medications[1]  Medications Prior to Admission  Medication Sig Dispense Refill Last Dose/Taking   DEPAKOTE  ER 250 MG 24 hr tablet Take 4 tablets (1,000 mg total) by mouth daily. 360 tablet 3 08/22/2024 Morning   acetaminophen  (TYLENOL ) 500 MG tablet Take 2 tablets (1,000 mg total) by mouth every 6 (six) hours as needed. 30 tablet 0    Biotin 89999 MCG TBDP Take 10,000 mcg by mouth daily.      fluconazole  (DIFLUCAN ) 150 MG tablet Take 1 tablet (150 mg total) by mouth daily. (Patient not taking: Reported on 07/09/2024) 5 tablet 0    Fluocinolone  Acetonide Scalp 0.01 % OIL Apply 1 application. topically daily as needed. APPLY EXTERNALLY TO THE AFFECTED AREA DAILY AS NEEDED Strength: 0.01% 118.28 mL 11    fluticasone  (FLONASE ) 50 MCG/ACT nasal spray Place 1 spray into both nostrils daily. 16 mL 2    hydrocortisone  2.5%-nystatin-zinc  oxide 20% 1:1:1 ointment mixture Apply topically 2 (two) times daily. To affected areas 240 g 3    ketoconazole  (NIZORAL ) 2 % shampoo APPLY EXTERNALLY 2 TIMES A WEEK 120 mL 11    linaclotide  (LINZESS ) 290 MCG CAPS capsule TAKE 1 CAPSULE BY MOUTH DAILY BEFORE BREAKFAST 90 capsule 3    metroNIDAZOLE  (FLAGYL ) 500 MG tablet Take 1 tablet (500 mg total) by mouth 2 (two) times daily. (Patient not taking: Reported on 07/09/2024) 20 tablet 0    mupirocin  ointment (BACTROBAN ) 2 % Apply 1 Application topically 2 (two) times daily. 88 g 0    polyethylene glycol powder (GLYCOLAX /MIRALAX ) 17 GM/SCOOP powder Take 1 Container by mouth daily as needed for mild constipation. 1 scoop as needed      Semaglutide , 2 MG/DOSE, (OZEMPIC , 2 MG/DOSE,) 8 MG/3ML SOPN Inject 2 mg into the skin once a week. 3 mL 3 08/14/2024   tobramycin -dexamethasone  (TOBRADEX ) ophthalmic solution Place 1  drop into the right eye every 4 (four) hours while awake. 5 mL 0      Allergies[2]   Past Medical History:  Diagnosis Date   Anemia    Anemia, iron  deficiency 04/10/2012   Appendicitis 06/04/2020    Constipation    Depression 04/26/2013   history - no current problems   Generalized headaches    Hemorrhoids    History of blood transfusion 02/2013   [facility]  2 units transfused    History of laparoscopic partial gastrectomy 03/01/2017   Medication exposure during first trimester of pregnancy 07/15/2011   No issues at this time     Nausea & vomiting    resolved after delivery - C/S, extreme   Normocytic anemia 12/13/2011   Monitoring closely and hem/onc is involved     Rectal bleeding    Rectal pain    Seasonal allergies    Seizure (HCC)    Seizures (HCC)    last seizure was  08/2012   Thalassemia    Weakness    resolved after C/S delivery    Review of systems:  Otherwise negative.    Physical Exam  Gen: Alert, oriented. Appears stated age.  HEENT: [place]/AT. PERRLA. Lungs: CTA, no wheezes. CV: RR nl S1, S2. Abd: soft, benign, no masses. BS+ Ext: No edema. Pulses 2+    Planned procedures: Proceed with colonoscopy. The patient understands the nature of the planned procedure, indications, risks, alternatives and potential complications including but not limited to bleeding, infection, perforation, damage to internal organs and possible oversedation/side effects from anesthesia. The patient agrees and gives consent to proceed.  Please refer to procedure notes for findings, recommendations and patient disposition/instructions.     Abigail Hall K. Hall, M.D. Gastroenterology 08/22/2024  9:54 AM          [1]  Current Facility-Administered Medications:    0.9 %  sodium chloride  infusion, , Intravenous, Continuous, Mearl Harewood K, MD [2]  Allergies Allergen Reactions   Cephalexin  Hives   Latex Itching, Swelling and Rash   Keflex  [Cephalexin ] Hives

## 2024-08-22 NOTE — Op Note (Signed)
 [facility] Gastroenterology Patient Name: Abigail Hall Procedure Date: 08/22/2024 10:42 AM MRN: [id number] Account #: [id number] Date of Birth: Aug 24, 1980 Admit Type: Outpatient Age: 44 Room: [facility] ENDO ROOM 2 Gender: Female Note Status: Finalized Instrument Name: Colon Scope [id number] Procedure:             Colonoscopy Indications:           Hematochezia, Change in bowel habits Providers:             Nikita Humble K. Aundria MD, MD Referring MD:          Jordan Shirley (Referring MD) Medicines:             Propofol  per Anesthesia Complications:         No immediate complications. Estimated blood loss: None. Procedure:             Pre-Anesthesia Assessment:                        - The risks and benefits of the procedure and the                         sedation options and risks were discussed with the                         patient. All questions were answered and informed                         consent was obtained.                        - Patient identification and proposed procedure were                         verified prior to the procedure by the nurse. The                         procedure was verified in the procedure room.                        - ASA Grade Assessment: II - A patient with mild                         systemic disease.                        - After reviewing the risks and benefits, the patient                         was deemed in satisfactory condition to undergo the                         procedure.                        After obtaining informed consent, the colonoscope was                         passed under direct vision. Throughout the procedure,  the patient's blood pressure, pulse, and oxygen                         saturations were monitored continuously. The                         Colonoscope was introduced through the anus and                         advanced to the the cecum, identified by appendiceal                          orifice and ileocecal valve. The colonoscopy was                         performed without difficulty. The patient tolerated                         the procedure well. The quality of the bowel                         preparation was adequate. The ileocecal valve,                         appendiceal orifice, and rectum were photographed. Findings:      The perianal and digital rectal examinations were normal. Pertinent       negatives include normal sphincter tone and no palpable rectal lesions.      Non-bleeding internal hemorrhoids were found during retroflexion. The       hemorrhoids were Grade II (internal hemorrhoids that prolapse but reduce       spontaneously).      The exam was otherwise without abnormality. Impression:            - Non-bleeding internal hemorrhoids.                        - The examination was otherwise normal.                        - No specimens collected. Recommendation:        - Patient has a contact number available for                         emergencies. The signs and symptoms of potential                         delayed complications were discussed with the patient.                         Return to normal activities tomorrow. Written                         discharge instructions were provided to the patient.                        - High fiber diet.                        - Continue present medications.                        -  Repeat colonoscopy in 10 years for screening                         purposes.                        - Return to GI office PRN.                        - The findings and recommendations were discussed with                         the patient. Procedure Code(s):     --- Professional ---                        [phone], Colonoscopy, flexible; diagnostic, including                         collection of specimen(s) by brushing or washing, when                         performed (separate procedure) Diagnosis  Code(s):     --- Professional ---                        R19.4, Change in bowel habit                        K92.1, Melena (includes Hematochezia)                        K64.1, Second degree hemorrhoids CPT copyright 2022 [employer]. All rights reserved. The codes documented in this report are preliminary and upon coder review may  be revised to meet current compliance requirements. Ladell MARLA Boss MD, MD 08/22/2024 11:16:04 AM This report has been signed electronically. Number of Addenda: 0 Note Initiated On: 08/22/2024 10:42 AM Scope Withdrawal Time: 0 hours 7 minutes 10 seconds  Total Procedure Duration: 0 hours 12 minutes 48 seconds  Estimated Blood Loss:  Estimated blood loss: none.      [facility]

## 2024-08-22 NOTE — Transfer of Care (Signed)
 Immediate Anesthesia Transfer of Care Note  Patient: Abigail Hall  Procedure(s) Performed: COLONOSCOPY  Patient Location: PACU and Endoscopy Unit  Anesthesia Type:General  Level of Consciousness: awake and alert   Airway & Oxygen Therapy: Patient Spontanous Breathing  Post-op Assessment: Report given to RN and Post -op Vital signs reviewed and stable  Post vital signs: stable  Last Vitals:  Vitals Value Taken Time  BP    Temp    Pulse 91 08/22/24 11:18  Resp 16 08/22/24 11:18  SpO2 100 % 08/22/24 11:18  Vitals shown include unfiled device data.  Last Pain:  Vitals:   08/22/24 0950  TempSrc: Temporal         Complications: There were no known notable events for this encounter.

## 2024-08-22 NOTE — Anesthesia Preprocedure Evaluation (Signed)
"                                    Anesthesia Evaluation  Patient identified by MRN, date of birth, ID band Patient awake    Reviewed: Allergy & Precautions, NPO status , Patient's Chart, lab work & pertinent test results  Airway Mallampati: II  TM Distance: >3 FB Neck ROM: Full    Dental no notable dental hx. (+) Teeth Intact   Pulmonary neg pulmonary ROS   Pulmonary exam normal breath sounds clear to auscultation       Cardiovascular negative cardio ROS Normal cardiovascular exam Rhythm:Regular Rate:Normal     Neuro/Psych  Headaches, Seizures -, Well Controlled,  PSYCHIATRIC DISORDERS  Depression     Neuromuscular disease    GI/Hepatic Acute appendicitis   Endo/Other  Obesity  Renal/GU      Musculoskeletal   Abdominal   Peds  Hematology  (+) Blood dyscrasia, anemia Alpha thalassemia minor   Anesthesia Other Findings   Reproductive/Obstetrics                              Anesthesia Physical Anesthesia Plan  ASA: 2  Anesthesia Plan: General   Post-op Pain Management: Minimal or no pain anticipated   Induction: Intravenous  PONV Risk Score and Plan: 2 and Propofol  infusion and TIVA  Airway Management Planned: Nasal Cannula  Additional Equipment: None  Intra-op Plan:   Post-operative Plan:   Informed Consent: I have reviewed the patients History and Physical, chart, labs and discussed the procedure including the risks, benefits and alternatives for the proposed anesthesia with the patient or authorized representative who has indicated his/her understanding and acceptance.     Dental advisory given  Plan Discussed with: CRNA and Surgeon  Anesthesia Plan Comments: (Discussed risks of anesthesia with patient, including possibility of difficulty with spontaneous ventilation under anesthesia necessitating airway intervention, PONV, and rare risks such as cardiac or respiratory or neurological events, and  allergic reactions. Discussed the role of CRNA in patient's perioperative care. Patient understands.)        Anesthesia Quick Evaluation  "

## 2024-08-22 NOTE — Interval H&P Note (Signed)
 History and Physical Interval Note:  08/22/2024 9:54 AM  Abigail Hall  has presented today for surgery, with the diagnosis of Hematochezia (K92.1) Change in bowel habits (R19.4).  The various methods of treatment have been discussed with the patient and family. After consideration of risks, benefits and other options for treatment, the patient has consented to  Procedures: COLONOSCOPY (N/A) as a surgical intervention.  The patient's history has been reviewed, patient examined, no change in status, stable for surgery.  I have reviewed the patient's chart and labs.  Questions were answered to the patient's satisfaction.     [place], [name]

## 2024-08-30 ENCOUNTER — Other Ambulatory Visit: Payer: Self-pay | Admitting: Family

## 2024-10-09 ENCOUNTER — Ambulatory Visit: Admitting: Family

## 2024-10-10 ENCOUNTER — Ambulatory Visit: Admitting: Neurology
